# Patient Record
Sex: Female | Born: 1945
Health system: Southern US, Community
[De-identification: ages and names within clinical notes are randomized; demographics above are authoritative.]

## PROBLEM LIST (undated history)

## (undated) DIAGNOSIS — H269 Unspecified cataract: Secondary | ICD-10-CM

## (undated) DIAGNOSIS — E785 Hyperlipidemia, unspecified: Secondary | ICD-10-CM

## (undated) DIAGNOSIS — N952 Postmenopausal atrophic vaginitis: Secondary | ICD-10-CM

## (undated) DIAGNOSIS — D649 Anemia, unspecified: Secondary | ICD-10-CM

## (undated) DIAGNOSIS — N390 Urinary tract infection, site not specified: Secondary | ICD-10-CM

## (undated) DIAGNOSIS — R600 Localized edema: Secondary | ICD-10-CM

## (undated) DIAGNOSIS — R1310 Dysphagia, unspecified: Secondary | ICD-10-CM

## (undated) DIAGNOSIS — M419 Scoliosis, unspecified: Secondary | ICD-10-CM

## (undated) DIAGNOSIS — L409 Psoriasis, unspecified: Secondary | ICD-10-CM

## (undated) DIAGNOSIS — R109 Unspecified abdominal pain: Secondary | ICD-10-CM

## (undated) DIAGNOSIS — R300 Dysuria: Secondary | ICD-10-CM

## (undated) DIAGNOSIS — K529 Noninfective gastroenteritis and colitis, unspecified: Secondary | ICD-10-CM

## (undated) DIAGNOSIS — K219 Gastro-esophageal reflux disease without esophagitis: Secondary | ICD-10-CM

## (undated) DIAGNOSIS — M810 Age-related osteoporosis without current pathological fracture: Secondary | ICD-10-CM

## (undated) DIAGNOSIS — T4145XA Adverse effect of unspecified anesthetic, initial encounter: Secondary | ICD-10-CM

## (undated) DIAGNOSIS — I2510 Atherosclerotic heart disease of native coronary artery without angina pectoris: Secondary | ICD-10-CM

## (undated) DIAGNOSIS — F419 Anxiety disorder, unspecified: Secondary | ICD-10-CM

## (undated) DIAGNOSIS — H8110 Benign paroxysmal vertigo, unspecified ear: Secondary | ICD-10-CM

## (undated) DIAGNOSIS — I10 Essential (primary) hypertension: Secondary | ICD-10-CM

## (undated) DIAGNOSIS — K259 Gastric ulcer, unspecified as acute or chronic, without hemorrhage or perforation: Secondary | ICD-10-CM

## (undated) DIAGNOSIS — T8859XA Other complications of anesthesia, initial encounter: Secondary | ICD-10-CM

## (undated) DIAGNOSIS — K449 Diaphragmatic hernia without obstruction or gangrene: Secondary | ICD-10-CM

## (undated) DIAGNOSIS — Z800 Family history of malignant neoplasm of digestive organs: Secondary | ICD-10-CM

## (undated) DIAGNOSIS — H3530 Unspecified macular degeneration: Secondary | ICD-10-CM

## (undated) DIAGNOSIS — M1990 Unspecified osteoarthritis, unspecified site: Secondary | ICD-10-CM

## (undated) DIAGNOSIS — K222 Esophageal obstruction: Secondary | ICD-10-CM

## (undated) DIAGNOSIS — F4322 Adjustment disorder with anxiety: Secondary | ICD-10-CM

## (undated) DIAGNOSIS — E7800 Pure hypercholesterolemia, unspecified: Secondary | ICD-10-CM

## (undated) DIAGNOSIS — G8929 Other chronic pain: Secondary | ICD-10-CM

## (undated) DIAGNOSIS — R4702 Dysphasia: Secondary | ICD-10-CM

## (undated) HISTORY — DX: Anemia, unspecified: D64.9

## (undated) HISTORY — PX: CARDIAC CATHETERIZATION: SHX172

## (undated) HISTORY — PX: COLONOSCOPY: SHX174

## (undated) HISTORY — PX: ABDOMINAL HYSTERECTOMY: SHX81

## (undated) HISTORY — DX: Unspecified cataract: H26.9

## (undated) HISTORY — PX: ESOPHAGOGASTRODUODENOSCOPY: SHX1529

## (undated) HISTORY — PX: CHOLECYSTECTOMY: SHX55

## (undated) HISTORY — DX: Age-related osteoporosis without current pathological fracture: M81.0

## (undated) HISTORY — PX: HERNIA REPAIR: SHX51

## (undated) HISTORY — DX: Unspecified osteoarthritis, unspecified site: M19.90

## (undated) HISTORY — PX: DIAGNOSTIC LAPAROSCOPY: SUR761

---

## 1979-03-11 DIAGNOSIS — K259 Gastric ulcer, unspecified as acute or chronic, without hemorrhage or perforation: Secondary | ICD-10-CM

## 1979-03-11 HISTORY — DX: Gastric ulcer, unspecified as acute or chronic, without hemorrhage or perforation: K25.9

## 2005-01-12 ENCOUNTER — Ambulatory Visit: Payer: Self-pay | Admitting: Surgery

## 2006-05-26 ENCOUNTER — Ambulatory Visit: Payer: Self-pay | Admitting: Internal Medicine

## 2006-06-20 ENCOUNTER — Ambulatory Visit: Payer: Self-pay | Admitting: Internal Medicine

## 2007-07-18 ENCOUNTER — Ambulatory Visit: Payer: Self-pay | Admitting: Internal Medicine

## 2008-04-10 ENCOUNTER — Ambulatory Visit: Payer: Self-pay | Admitting: Urology

## 2008-08-25 ENCOUNTER — Ambulatory Visit: Payer: Self-pay | Admitting: Internal Medicine

## 2009-09-08 ENCOUNTER — Ambulatory Visit: Payer: Self-pay | Admitting: Unknown Physician Specialty

## 2009-10-07 ENCOUNTER — Ambulatory Visit: Payer: Self-pay | Admitting: Internal Medicine

## 2009-10-20 ENCOUNTER — Ambulatory Visit: Payer: Self-pay | Admitting: Internal Medicine

## 2010-10-26 ENCOUNTER — Ambulatory Visit: Payer: Self-pay | Admitting: Internal Medicine

## 2011-11-15 ENCOUNTER — Ambulatory Visit: Payer: Self-pay | Admitting: Internal Medicine

## 2012-01-08 ENCOUNTER — Ambulatory Visit: Payer: Self-pay | Admitting: Internal Medicine

## 2012-09-02 ENCOUNTER — Ambulatory Visit: Payer: Self-pay | Admitting: Cardiovascular Disease

## 2012-09-06 DIAGNOSIS — K219 Gastro-esophageal reflux disease without esophagitis: Secondary | ICD-10-CM | POA: Insufficient documentation

## 2012-09-06 DIAGNOSIS — I2510 Atherosclerotic heart disease of native coronary artery without angina pectoris: Secondary | ICD-10-CM | POA: Insufficient documentation

## 2013-02-07 ENCOUNTER — Ambulatory Visit: Payer: Self-pay | Admitting: Internal Medicine

## 2013-04-14 ENCOUNTER — Ambulatory Visit: Payer: Self-pay | Admitting: Unknown Physician Specialty

## 2013-04-18 ENCOUNTER — Other Ambulatory Visit: Payer: Self-pay | Admitting: Unknown Physician Specialty

## 2013-04-18 DIAGNOSIS — R1310 Dysphagia, unspecified: Secondary | ICD-10-CM

## 2013-04-18 DIAGNOSIS — K469 Unspecified abdominal hernia without obstruction or gangrene: Secondary | ICD-10-CM

## 2013-04-28 ENCOUNTER — Ambulatory Visit
Admission: RE | Admit: 2013-04-28 | Discharge: 2013-04-28 | Disposition: A | Discharge status: Home / Self Care | Payer: Medicare Other | Source: Ambulatory Visit | Attending: Unknown Physician Specialty | Admitting: Unknown Physician Specialty

## 2013-04-28 DIAGNOSIS — R1310 Dysphagia, unspecified: Secondary | ICD-10-CM

## 2013-04-28 DIAGNOSIS — K469 Unspecified abdominal hernia without obstruction or gangrene: Secondary | ICD-10-CM

## 2013-05-02 DIAGNOSIS — K222 Esophageal obstruction: Secondary | ICD-10-CM

## 2013-05-02 HISTORY — DX: Esophageal obstruction: K22.2

## 2013-05-21 DIAGNOSIS — I10 Essential (primary) hypertension: Secondary | ICD-10-CM | POA: Insufficient documentation

## 2013-05-22 DIAGNOSIS — K449 Diaphragmatic hernia without obstruction or gangrene: Secondary | ICD-10-CM | POA: Insufficient documentation

## 2013-09-12 ENCOUNTER — Ambulatory Visit: Payer: Self-pay | Admitting: Unknown Physician Specialty

## 2014-01-23 ENCOUNTER — Encounter: Payer: Self-pay | Admitting: Surgery

## 2014-02-07 ENCOUNTER — Encounter: Payer: Self-pay | Admitting: Surgery

## 2014-03-20 ENCOUNTER — Ambulatory Visit: Payer: Self-pay | Admitting: Internal Medicine

## 2014-11-12 ENCOUNTER — Encounter
Admission: RE | Disposition: A | Discharge status: Home / Self Care | Payer: Self-pay | Source: Ambulatory Visit | Attending: Unknown Physician Specialty

## 2014-11-12 ENCOUNTER — Ambulatory Visit
Admission: RE | Admit: 2014-11-12 | Discharge: 2014-11-12 | Disposition: A | Discharge status: Home / Self Care | Payer: Medicare HMO | Source: Ambulatory Visit | Attending: Unknown Physician Specialty | Admitting: Unknown Physician Specialty

## 2014-11-12 ENCOUNTER — Ambulatory Visit: Payer: Medicare HMO | Admitting: Anesthesiology

## 2014-11-12 ENCOUNTER — Encounter: Payer: Self-pay | Admitting: *Deleted

## 2014-11-12 DIAGNOSIS — M419 Scoliosis, unspecified: Secondary | ICD-10-CM | POA: Insufficient documentation

## 2014-11-12 DIAGNOSIS — Z7982 Long term (current) use of aspirin: Secondary | ICD-10-CM | POA: Insufficient documentation

## 2014-11-12 DIAGNOSIS — Z9889 Other specified postprocedural states: Secondary | ICD-10-CM | POA: Diagnosis not present

## 2014-11-12 DIAGNOSIS — K640 First degree hemorrhoids: Secondary | ICD-10-CM | POA: Insufficient documentation

## 2014-11-12 DIAGNOSIS — Z9049 Acquired absence of other specified parts of digestive tract: Secondary | ICD-10-CM | POA: Diagnosis not present

## 2014-11-12 DIAGNOSIS — I10 Essential (primary) hypertension: Secondary | ICD-10-CM | POA: Insufficient documentation

## 2014-11-12 DIAGNOSIS — Z79899 Other long term (current) drug therapy: Secondary | ICD-10-CM | POA: Insufficient documentation

## 2014-11-12 DIAGNOSIS — K219 Gastro-esophageal reflux disease without esophagitis: Secondary | ICD-10-CM | POA: Insufficient documentation

## 2014-11-12 DIAGNOSIS — Z90710 Acquired absence of both cervix and uterus: Secondary | ICD-10-CM | POA: Diagnosis not present

## 2014-11-12 DIAGNOSIS — E785 Hyperlipidemia, unspecified: Secondary | ICD-10-CM | POA: Insufficient documentation

## 2014-11-12 DIAGNOSIS — R197 Diarrhea, unspecified: Secondary | ICD-10-CM | POA: Diagnosis not present

## 2014-11-12 DIAGNOSIS — Z800 Family history of malignant neoplasm of digestive organs: Secondary | ICD-10-CM | POA: Insufficient documentation

## 2014-11-12 HISTORY — DX: Anxiety disorder, unspecified: F41.9

## 2014-11-12 HISTORY — DX: Adverse effect of unspecified anesthetic, initial encounter: T41.45XA

## 2014-11-12 HISTORY — DX: Scoliosis, unspecified: M41.9

## 2014-11-12 HISTORY — DX: Gastro-esophageal reflux disease without esophagitis: K21.9

## 2014-11-12 HISTORY — DX: Essential (primary) hypertension: I10

## 2014-11-12 HISTORY — DX: Esophageal obstruction: K22.2

## 2014-11-12 HISTORY — DX: Other complications of anesthesia, initial encounter: T88.59XA

## 2014-11-12 HISTORY — DX: Gastric ulcer, unspecified as acute or chronic, without hemorrhage or perforation: K25.9

## 2014-11-12 HISTORY — DX: Pure hypercholesterolemia, unspecified: E78.00

## 2014-11-12 HISTORY — PX: COLONOSCOPY: SHX5424

## 2014-11-12 SURGERY — COLONOSCOPY
Anesthesia: Monitor Anesthesia Care

## 2014-11-12 MED ORDER — SODIUM CHLORIDE 0.9 % IV SOLN: INTRAVENOUS | Status: DC

## 2014-11-12 MED ORDER — PROPOFOL INFUSION 10 MG/ML OPTIME
INTRAVENOUS | Status: DC | PRN
  Administered 2014-11-12: 120 ug/kg/min via INTRAVENOUS

## 2014-11-12 MED ORDER — LACTATED RINGERS IV SOLN
INTRAVENOUS | Status: DC
  Administered 2014-11-12: 10:00:00 via INTRAVENOUS
  Administered 2014-11-12: 1000 mL via INTRAVENOUS

## 2014-11-12 MED ORDER — FENTANYL CITRATE (PF) 100 MCG/2ML IJ SOLN
INTRAMUSCULAR | Status: DC | PRN
  Administered 2014-11-12: 50 ug via INTRAVENOUS

## 2014-11-12 MED ORDER — MIDAZOLAM HCL 2 MG/2ML IJ SOLN
INTRAMUSCULAR | Status: DC | PRN
  Administered 2014-11-12: 2 mg via INTRAVENOUS

## 2014-11-12 NOTE — Op Note (Signed)
[facility] Patient Name: Lindsay Tran Procedure Date: 11/12/2014 10:29 AM MRN: [id number] Account #: [id number] Date of Birth: [birth year] Admit Type: Outpatient Age: 69 Room: [facility] ENDO ROOM 1 Gender: Female Note Status: Finalized Procedure:         Colonoscopy Indications:       Chronic diarrhea, Clinically significant diarrhea of                     unexplained origin Providers:         Manya Silvas, MD Referring MD:      Perrin Maltese, MD (Referring MD) Medicines:         Propofol per Anesthesia Complications:     No immediate complications. Procedure:         Pre-Anesthesia Assessment:                    - After reviewing the risks and benefits, the patient was                     deemed in satisfactory condition to undergo the procedure.                    After obtaining informed consent, the colonoscope was                     passed under direct vision. Throughout the procedure, the                     patient's blood pressure, pulse, and oxygen saturations                     were monitored continuously. The Olympus PCF-H180AL                     colonoscope ( S#: [id number] ) was introduced through the                     anus and advanced to the the cecum, identified by                     appendiceal orifice and ileocecal valve. The colonoscopy                     was performed without difficulty. The patient tolerated                     the procedure well. The quality of the bowel preparation                     was excellent. Findings:      Internal hemorrhoids were found during endoscopy. The hemorrhoids were       small and Grade I (internal hemorrhoids that do not prolapse).      No other significant abnormalities were identified in a careful       examination of the remainder of the colon. Due to diarrhea biopsies done       of right and left colon, ascending and sigmoid colon. Impression:        - Internal hemorrhoids.                 - No specimens collected. Recommendation:    - Await pathology results. Manya Silvas, MD 11/12/2014 10:49:00 AM This report has been signed electronically. Number of Addenda:  0 Note Initiated On: 11/12/2014 10:29 AM Scope Withdrawal Time: 0 hours 6 minutes 32 seconds  Total Procedure Duration: 0 hours 13 minutes 4 seconds       [facility]

## 2014-11-12 NOTE — Anesthesia Preprocedure Evaluation (Signed)
Anesthesia Evaluation  Patient identified by MRN, date of birth, ID band  Reviewed: Allergy & Precautions, H&P , NPO status , Patient's Chart, lab work & pertinent test results, reviewed documented beta blocker date and time   History of Anesthesia Complications (+) history of anesthetic complications  Airway Mallampati: II  TM Distance: >3 FB     Dental   Pulmonary          Cardiovascular hypertension, On Medications and On Home Beta Blockers     Neuro/Psych    GI/Hepatic PUD, GERD-  ,  Endo/Other    Renal/GU      Musculoskeletal   Abdominal   Peds  Hematology   Anesthesia Other Findings   Reproductive/Obstetrics                             Anesthesia Physical Anesthesia Plan  ASA: III  Anesthesia Plan: MAC and General   Post-op Pain Management:    Induction: Intravenous  Airway Management Planned: Nasal Cannula  Additional Equipment:   Intra-op Plan:   Post-operative Plan:   Informed Consent: I have reviewed the patients History and Physical, chart, labs and discussed the procedure including the risks, benefits and alternatives for the proposed anesthesia with the patient or authorized representative who has indicated his/her understanding and acceptance.     Plan Discussed with:   Anesthesia Plan Comments:         Anesthesia Quick Evaluation

## 2014-11-12 NOTE — Transfer of Care (Signed)
Immediate Anesthesia Transfer of Care Note  Patient: Lindsay Tran  Procedure(s) Performed: Procedure(s): COLONOSCOPY (N/A)  Patient Location: PACU  Anesthesia Type:MAC  Level of Consciousness: awake, alert  and oriented  Airway & Oxygen Therapy: Patient Spontanous Breathing  Post-op Assessment: Report given to RN  Post vital signs: Reviewed and stable  Last Vitals:  Filed Vitals:   11/12/14 0943  BP: 121/75  Pulse: 70  Temp: 36.4 C  Resp: 22    Complications: No apparent anesthesia complications

## 2014-11-12 NOTE — H&P (Signed)
The recent H&P (dated 10/21/2014) was reviewed, the patient was examined and there is no change in the patients condition since that H&P was completed.   Lindsay Tran, Lindsay Tran  11/12/2014, 10:01 AM

## 2014-11-12 NOTE — Anesthesia Postprocedure Evaluation (Signed)
  Anesthesia Post-op Note  Patient: Lindsay Tran  Procedure(s) Performed: Procedure(s): COLONOSCOPY (N/A)  Anesthesia type:MAC, General  Patient location: PACU  Post pain: Pain level controlled  Post assessment: Post-op Vital signs reviewed, Patient's Cardiovascular Status Stable, Respiratory Function Stable, Patent Airway and No signs of Nausea or vomiting  Post vital signs: Reviewed and stable  Last Vitals:  Filed Vitals:   11/12/14 1130  BP: 131/66  Pulse: 65  Temp:   Resp: 17    Level of consciousness: awake, alert  and patient cooperative  Complications: No apparent anesthesia complications

## 2014-11-13 ENCOUNTER — Encounter: Payer: Self-pay | Admitting: Unknown Physician Specialty

## 2014-11-13 LAB — SURGICAL PATHOLOGY

## 2014-12-17 DIAGNOSIS — K529 Noninfective gastroenteritis and colitis, unspecified: Secondary | ICD-10-CM | POA: Insufficient documentation

## 2014-12-18 DIAGNOSIS — Z800 Family history of malignant neoplasm of digestive organs: Secondary | ICD-10-CM | POA: Insufficient documentation

## 2015-03-16 ENCOUNTER — Other Ambulatory Visit: Payer: Self-pay | Admitting: Internal Medicine

## 2015-03-16 DIAGNOSIS — Z1231 Encounter for screening mammogram for malignant neoplasm of breast: Secondary | ICD-10-CM

## 2015-03-25 ENCOUNTER — Other Ambulatory Visit: Payer: Self-pay | Admitting: Internal Medicine

## 2015-03-25 ENCOUNTER — Ambulatory Visit
Admission: RE | Admit: 2015-03-25 | Discharge: 2015-03-25 | Disposition: A | Discharge status: Home / Self Care | Payer: Medicare HMO | Source: Ambulatory Visit | Attending: Internal Medicine | Admitting: Internal Medicine

## 2015-03-25 DIAGNOSIS — Z1231 Encounter for screening mammogram for malignant neoplasm of breast: Secondary | ICD-10-CM | POA: Diagnosis not present

## 2015-07-21 ENCOUNTER — Ambulatory Visit: Payer: Medicare HMO | Admitting: Family Medicine

## 2015-07-21 ENCOUNTER — Ambulatory Visit (INDEPENDENT_AMBULATORY_CARE_PROVIDER_SITE_OTHER): Payer: PPO | Admitting: Nurse Practitioner

## 2015-07-21 ENCOUNTER — Encounter: Payer: Self-pay | Admitting: Nurse Practitioner

## 2015-07-21 VITALS — BP 124/76 | HR 59 | Temp 97.6°F | Resp 16 | Ht 60.0 in | Wt 159.2 lb

## 2015-07-21 DIAGNOSIS — R21 Rash and other nonspecific skin eruption: Secondary | ICD-10-CM

## 2015-07-21 DIAGNOSIS — Z7689 Persons encountering health services in other specified circumstances: Secondary | ICD-10-CM

## 2015-07-21 DIAGNOSIS — I10 Essential (primary) hypertension: Secondary | ICD-10-CM | POA: Diagnosis not present

## 2015-07-21 DIAGNOSIS — Z7189 Other specified counseling: Secondary | ICD-10-CM | POA: Diagnosis not present

## 2015-07-21 DIAGNOSIS — I2510 Atherosclerotic heart disease of native coronary artery without angina pectoris: Secondary | ICD-10-CM

## 2015-07-21 DIAGNOSIS — Z23 Encounter for immunization: Secondary | ICD-10-CM

## 2015-07-21 DIAGNOSIS — F4322 Adjustment disorder with anxiety: Secondary | ICD-10-CM

## 2015-07-21 DIAGNOSIS — K449 Diaphragmatic hernia without obstruction or gangrene: Secondary | ICD-10-CM

## 2015-07-21 DIAGNOSIS — E785 Hyperlipidemia, unspecified: Secondary | ICD-10-CM

## 2015-07-21 DIAGNOSIS — Z800 Family history of malignant neoplasm of digestive organs: Secondary | ICD-10-CM

## 2015-07-21 DIAGNOSIS — K529 Noninfective gastroenteritis and colitis, unspecified: Secondary | ICD-10-CM

## 2015-07-21 DIAGNOSIS — K219 Gastro-esophageal reflux disease without esophagitis: Secondary | ICD-10-CM

## 2015-07-21 LAB — COMPREHENSIVE METABOLIC PANEL
ALT: 11 U/L (ref 0–35)
AST: 22 U/L (ref 0–37)
Albumin: 4.2 g/dL (ref 3.5–5.2)
Alkaline Phosphatase: 40 U/L (ref 39–117)
BUN: 21 mg/dL (ref 6–23)
CHLORIDE: 102 meq/L (ref 96–112)
CO2: 29 meq/L (ref 19–32)
Calcium: 9.6 mg/dL (ref 8.4–10.5)
Creatinine, Ser: 0.92 mg/dL (ref 0.40–1.20)
GFR: 63.79 mL/min (ref >60.00)
Glucose, Bld: 94 mg/dL (ref 70–99)
POTASSIUM: 4.4 meq/L (ref 3.5–5.1)
SODIUM: 137 meq/L (ref 135–145)
Total Bilirubin: 0.5 mg/dL (ref 0.2–1.2)
Total Protein: 6.8 g/dL (ref 6.0–8.3)

## 2015-07-21 LAB — CBC WITH DIFFERENTIAL/PLATELET
BASOS PCT: 1.2 % (ref 0.0–3.0)
Basophils Absolute: 0.1 10*3/uL (ref 0.0–0.1)
EOS ABS: 0.2 10*3/uL (ref 0.0–0.7)
EOS PCT: 2.7 % (ref 0.0–5.0)
HCT: 38.5 % (ref 36.0–46.0)
HEMOGLOBIN: 12.5 g/dL (ref 12.0–15.0)
Lymphocytes Relative: 16.1 % (ref 12.0–46.0)
Lymphs Abs: 1.5 10*3/uL (ref 0.7–4.0)
MCHC: 32.5 g/dL (ref 30.0–36.0)
MCV: 86.4 fl (ref 78.0–100.0)
MONO ABS: 0.7 10*3/uL (ref 0.1–1.0)
Monocytes Relative: 7.5 % (ref 3.0–12.0)
NEUTROS ABS: 6.6 10*3/uL (ref 1.4–7.7)
Neutrophils Relative %: 72.5 % (ref 43.0–77.0)
PLATELETS: 472 10*3/uL — AB (ref 150.0–400.0)
RBC: 4.45 Mil/uL (ref 3.87–5.11)
RDW: 13.8 % (ref 11.5–15.5)
WBC: 9.1 10*3/uL (ref 4.0–10.5)

## 2015-07-21 LAB — LIPID PANEL
CHOL/HDL RATIO: 3
Cholesterol: 122 mg/dL (ref 0–200)
HDL: 40.1 mg/dL (ref >39.00)
LDL CALC: 62 mg/dL (ref 0–99)
NonHDL: 81.65
TRIGLYCERIDES: 98 mg/dL (ref 0.0–149.0)
VLDL: 19.6 mg/dL (ref 0.0–40.0)

## 2015-07-21 MED ORDER — CLOBETASOL PROPIONATE 0.05 % EX OINT: 1.0000 "application " | TOPICAL_OINTMENT | Freq: Two times a day (BID) | CUTANEOUS | Status: DC

## 2015-07-21 MED ORDER — PREDNISONE 10 MG PO TABS: ORAL_TABLET | ORAL | Status: DC

## 2015-07-21 MED ORDER — SIMVASTATIN 40 MG PO TABS: 40.0000 mg | ORAL_TABLET | Freq: Every day | ORAL | Status: DC

## 2015-07-21 MED ORDER — FENOFIBRATE 160 MG PO TABS: 160.0000 mg | ORAL_TABLET | Freq: Every day | ORAL | Status: DC

## 2015-07-21 MED ORDER — METOPROLOL TARTRATE 100 MG PO TABS: 100.0000 mg | ORAL_TABLET | Freq: Two times a day (BID) | ORAL | Status: DC

## 2015-07-21 NOTE — Progress Notes (Addendum)
Patient ID: Lindsay Tran, female    DOB: 1946/05/28  Age: 70 y.o. MRN: [id number]  CC: Establish Care and Rash   HPI Lindsay Tran presents for establishing care and CC of rash x 2 weeks.   1) New Pt Info:   Immunizations- Needs Tdap, pna 2011, flu declines   Mammogram- 2016   Pap- Hysterectomy  Bone Density- Unknown   Colonoscopy- 2016   2) Chronic Problems-  Arthritis- hands, chicken pox, GERD- stable, Heart disease she wrote calcification LVEF 65% (?) HTN, HLD, UTI's 4-5 yrs ago last one   3) Acute Problems-  Psoriasis- Top of legs and back   Rash- 2 weeks, pruritic, erythematous, not spreading  Tried her clobetasol without relief  Benadryl ect... Denies changes to cosmetics, detergent, body products, pets, foods   History Lindsay Tran has a past medical history of GERD (gastroesophageal reflux disease); Hypertension; Gastric ulcer (1980's); Scoliosis; Schatzki's ring (05/02/2013); High cholesterol; Anxiety; and Complication of anesthesia.   She has past surgical history that includes Diagnostic laparoscopy; Esophagogastroduodenoscopy; Cholecystectomy; Colonoscopy; Cardiac catheterization; Hernia repair; Abdominal hysterectomy; and Colonoscopy (N/A, 11/12/2014).   Her family history is not on file.She reports that she has never smoked. She does not have any smokeless tobacco history on file. She reports that she does not drink alcohol or use illicit drugs.  Outpatient Prescriptions Prior to Visit  Medication Sig Dispense Refill  . Ascorbic Acid (VITAMIN C) 100 MG tablet Take 100 mg by mouth daily.    Marland Kitchen aspirin 81 MG tablet Take 81 mg by mouth daily.    . cholecalciferol (VITAMIN D) 400 UNITS TABS tablet Take 400 Units by mouth.    . cholestyramine light 4 G POWD Take by mouth 1 day or 1 dose.    Marland Kitchen FIBER, CORN DEXTRIN, PO Take by mouth 2 (two) times daily.    . Misc Natural Products (OSTEO BI-FLEX/5-LOXIN ADVANCED) TABS Take 1 tablet by mouth 2 (two) times daily.    . Multiple  Vitamin (MULTIVITAMIN) capsule Take 1 capsule by mouth daily.    Marland Kitchen NAPROXEN PO Take by mouth 2 (two) times daily.    Marland Kitchen omega-3 acid ethyl esters (LOVAZA) 1 G capsule Take by mouth daily.     . potassium chloride (K-DUR) 10 MEQ tablet Take 10 mEq by mouth daily.    Marland Kitchen saccharomyces boulardii (FLORASTOR) 250 MG capsule Take 250 mg by mouth 2 (two) times daily.    . vitamin B-12 (CYANOCOBALAMIN) 100 MCG tablet Take 100 mcg by mouth daily.    . clobetasol ointment (TEMOVATE) [id number] % Apply 1 application topically 2 (two) times daily.    . fenofibrate 160 MG tablet Take 160 mg by mouth daily.    . metoprolol (LOPRESSOR) 100 MG tablet Take 100 mg by mouth 2 (two) times daily.    . simvastatin (ZOCOR) 40 MG tablet Take 40 mg by mouth daily at 6 PM.     No facility-administered medications prior to visit.   Essential tremor  Tdap tday ROS Review of Systems  Constitutional: Negative for fever, chills, diaphoresis and fatigue.  Respiratory: Negative for chest tightness, shortness of breath and wheezing.   Cardiovascular: Negative for chest pain, palpitations and leg swelling.  Gastrointestinal: Positive for diarrhea. Negative for nausea and vomiting.       Chronic  Skin: Positive for rash.  Neurological: Negative for dizziness, weakness, numbness and headaches.  Psychiatric/Behavioral: The patient is not nervous/anxious.     Objective:  BP 124/76 mmHg  Pulse  59  Temp(Src) 97.6 F (36.4 C) (Oral)  Resp 16  Ht 5' (1.524 m)  Wt 159 lb 3.2 oz (72.213 kg)  BMI 31.09 kg/m2  SpO2 97%  Physical Exam  Constitutional: She is oriented to person, place, and time. She appears well-developed and well-nourished. No distress.  HENT:  Head: Normocephalic and atraumatic.  Right Ear: External ear normal.  Left Ear: External ear normal.  Mouth/Throat: No oropharyngeal exudate.  Oropharynx not edematous  Eyes:  Glasses  Neck: Normal range of motion. Neck supple.  Cardiovascular: Regular rhythm and  normal heart sounds.  Exam reveals no gallop and no friction rub.   No murmur heard. Pulmonary/Chest: Effort normal and breath sounds normal. No stridor. No respiratory distress. She has no wheezes. She has no rales. She exhibits no tenderness.  Musculoskeletal:  Scoliosis and kyphosis  Neurological: She is alert and oriented to person, place, and time. No cranial nerve deficit. She exhibits normal muscle tone. Coordination normal.  Skin: Skin is warm and dry. Rash noted. She is not diaphoretic.  Arms and back, lightly erythematous from scratching, tiny papules  Psychiatric: She has a normal mood and affect. Her behavior is normal. Judgment and thought content normal.   Assessment & Plan:   [name] was seen today for establish care and rash.  Diagnoses and all orders for this visit:  Hyperlipidemia -     Lipid panel  Benign essential HTN -     CBC with Differential/Platelet -     Comprehensive metabolic panel  Need for Tdap vaccination -     Tdap vaccine greater than or equal to 7yo IM  Encounter to establish care  CAD in native artery  Gastroesophageal hernia  Chronic diarrhea  Gastroesophageal reflux disease without esophagitis  Adjustment disorder with anxiety  Family history of colon cancer  Rash and nonspecific skin eruption  Other orders -     predniSONE (DELTASONE) 10 MG tablet; Take 6 tablets by mouth on day 1 with breakfast then decrease by 1 tablet each day until gone. -     fenofibrate 160 MG tablet; Take 1 tablet (160 mg total) by mouth daily. -     simvastatin (ZOCOR) 40 MG tablet; Take 1 tablet (40 mg total) by mouth daily at 6 PM. -     metoprolol (LOPRESSOR) 100 MG tablet; Take 1 tablet (100 mg total) by mouth 2 (two) times daily. -     clobetasol ointment (TEMOVATE) 0.05 %; Apply 1 application topically 2 (two) times daily. -     Cancel: Tdap vaccine greater than or equal to 7yo IM   I have changed Ms. [name]'s fenofibrate, simvastatin, and  metoprolol. I am also having her start on predniSONE. Additionally, I am having her maintain her aspirin, omega-3 acid ethyl esters, saccharomyces boulardii, vitamin C, cholecalciferol, vitamin B-12, potassium chloride, cholestyramine light, OSTEO BI-FLEX/5-LOXIN ADVANCED, NAPROXEN PO, multivitamin, (FIBER, CORN DEXTRIN, PO), Potassium, HM GLUCOSAMINE & VITAMIN D3, and clobetasol ointment.  Meds ordered this encounter  Medications  . Potassium 99 MG TABS    Sig: Take by mouth.  . Boswellia-Glucosamine-Vit D (HM GLUCOSAMINE & VITAMIN D3) TABS    Sig: Take by mouth.  . predniSONE (DELTASONE) 10 MG tablet    Sig: Take 6 tablets by mouth on day 1 with breakfast then decrease by 1 tablet each day until gone.    Dispense:  21 tablet    Refill:  0    Order Specific Question:  Supervising Provider  Answer:  TULLO, TERESA L [[id number]]  . fenofibrate 160 MG tablet    Sig: Take 1 tablet (160 mg total) by mouth daily.    Dispense:  90 tablet    Refill:  3    Order Specific Question:  Supervising Provider    Answer:  Deborra Medina L [[id number]]  . simvastatin (ZOCOR) 40 MG tablet    Sig: Take 1 tablet (40 mg total) by mouth daily at 6 PM.    Dispense:  90 tablet    Refill:  3    Order Specific Question:  Supervising Provider    Answer:  Deborra Medina L [[id number]]  . metoprolol (LOPRESSOR) 100 MG tablet    Sig: Take 1 tablet (100 mg total) by mouth 2 (two) times daily.    Dispense:  180 tablet    Refill:  3    Order Specific Question:  Supervising Provider    Answer:  Deborra Medina L [[id number]]  . clobetasol ointment (TEMOVATE) 0.05 %    Sig: Apply 1 application topically 2 (two) times daily.    Dispense:  30 g    Refill:  1    Order Specific Question:  Supervising Provider    Answer:  Crecencio Mc [[id number]]     Follow-up: Return in about 4 weeks (around 08/18/2015) for Follow up.

## 2015-07-21 NOTE — Patient Instructions (Addendum)
Prednisone with breakfast or lunch at the latest.  6 tablets on day 1, 5 tablets on day 2, 4 tablets on day 3, 3 tablets on day 4, 2 tablets day 5, 1 tablet on day 6...done! Take tablets all together not spaced out Don't take with NSAIDs (Ibuprofen, Aleve, Naproxen, Meloxicam ect...) Start tomorrow Stop the Naproxen while on this  Thank you for getting your tdap today  Nice to meet you!

## 2015-07-27 ENCOUNTER — Encounter: Payer: Self-pay | Admitting: Nurse Practitioner

## 2015-07-27 DIAGNOSIS — I10 Essential (primary) hypertension: Secondary | ICD-10-CM | POA: Insufficient documentation

## 2015-07-27 DIAGNOSIS — Z23 Encounter for immunization: Secondary | ICD-10-CM | POA: Insufficient documentation

## 2015-07-27 DIAGNOSIS — Z7689 Persons encountering health services in other specified circumstances: Secondary | ICD-10-CM | POA: Insufficient documentation

## 2015-07-27 DIAGNOSIS — E785 Hyperlipidemia, unspecified: Secondary | ICD-10-CM | POA: Insufficient documentation

## 2015-07-27 DIAGNOSIS — R21 Rash and other nonspecific skin eruption: Secondary | ICD-10-CM | POA: Insufficient documentation

## 2015-07-27 NOTE — Assessment & Plan Note (Signed)
BP Readings from Last 3 Encounters:  07/21/15 124/76  11/12/14 131/66   Stable. Continue regimen

## 2015-07-27 NOTE — Assessment & Plan Note (Signed)
See Overview Stable

## 2015-07-27 NOTE — Assessment & Plan Note (Signed)
On medication and probiotics Stable

## 2015-07-27 NOTE — Assessment & Plan Note (Signed)
Stable at this time 

## 2015-07-27 NOTE — Assessment & Plan Note (Signed)
New onset Prednisone taper given to pt with instructions verbally and on AVS

## 2015-07-27 NOTE — Assessment & Plan Note (Signed)
Discussed acute and chronic issues. Reviewed health maintenance measures, PFSHx, and immunizations. Obtain records from previous facility.   

## 2015-07-27 NOTE — Assessment & Plan Note (Signed)
Done today.

## 2015-07-27 NOTE — Assessment & Plan Note (Signed)
Stable- hiatal hernia On prbiotics

## 2015-07-27 NOTE — Assessment & Plan Note (Signed)
Stable currently per pt, avoids triggers

## 2015-07-27 NOTE — Assessment & Plan Note (Signed)
Fasting. Getting a lipid panel today On Zocor- refilled Fenofibrate refilled also

## 2015-07-27 NOTE — Assessment & Plan Note (Signed)
Colon screening UTD

## 2015-08-03 ENCOUNTER — Encounter: Payer: Self-pay | Admitting: Family Medicine

## 2015-08-03 ENCOUNTER — Ambulatory Visit (INDEPENDENT_AMBULATORY_CARE_PROVIDER_SITE_OTHER): Payer: PPO | Admitting: Family Medicine

## 2015-08-03 VITALS — BP 130/82 | HR 68 | Temp 97.6°F | Ht 60.0 in | Wt 158.1 lb

## 2015-08-03 DIAGNOSIS — R109 Unspecified abdominal pain: Secondary | ICD-10-CM | POA: Insufficient documentation

## 2015-08-03 DIAGNOSIS — R102 Pelvic and perineal pain: Secondary | ICD-10-CM | POA: Insufficient documentation

## 2015-08-03 DIAGNOSIS — R1031 Right lower quadrant pain: Secondary | ICD-10-CM | POA: Diagnosis not present

## 2015-08-03 LAB — POC URINALSYSI DIPSTICK (AUTOMATED)
BILIRUBIN UA: NEGATIVE
Glucose, UA: NEGATIVE
Ketones, UA: NEGATIVE
LEUKOCYTES UA: NEGATIVE
NITRITE UA: NEGATIVE
PH UA: 5.5
PROTEIN UA: NEGATIVE
Spec Grav, UA: 1.02
UROBILINOGEN UA: 0.2

## 2015-08-03 MED ORDER — CIPROFLOXACIN HCL 500 MG PO TABS: 500.0000 mg | ORAL_TABLET | Freq: Two times a day (BID) | ORAL | Status: DC

## 2015-08-03 NOTE — Progress Notes (Signed)
Pre visit review using our clinic review tool, if applicable. No additional management support is needed unless otherwise documented below in the visit note. 

## 2015-08-03 NOTE — Progress Notes (Signed)
Subjective:  Patient ID: Lindsay Tran, female    DOB: [birth year]  Age: 70 y.o. MRN: [id number]  CC: Abdominal pain, ? UTI  HPI:  70 year old female with a reported history of UTI presents with the above complaints.  Patient states on Saturday she developed burning with urination as well as suprapubic pain and right lower quadrant pain. She states that given her symptoms she thought she had a UTI and took some leftover Bactrim that she had at home. She denies any associated fever. She does report some nausea. No vomiting. She states that she has improved slightly but she continues to have pain. No known exacerbating factors. No other associated symptoms. She states that these the symptoms that she has when she has UTI.  Social Hx   Social History   Social History  . Marital Status: Single    Spouse Name: N/A  . Number of Children: N/A  . Years of Education: N/A   Social History Main Topics  . Smoking status: Never Smoker   . Smokeless tobacco: None  . Alcohol Use: No  . Drug Use: No  . Sexual Activity: No   Other Topics Concern  . None   Social History Narrative   Divorced   Retired as a Scientist, research (medical)   12 + years of school    Review of Systems  Constitutional: Negative for fever.  Gastrointestinal: Positive for nausea and abdominal pain. Negative for vomiting.  Genitourinary: Positive for dysuria.   Objective:  BP 130/82 mmHg  Pulse 68  Temp(Src) 97.6 F (36.4 C) (Oral)  Ht 5' (1.524 m)  Wt 158 lb 2 oz (71.725 kg)  BMI 30.88 kg/m2  SpO2 98%  BP/Weight 08/03/2015 [id number]  Systolic BP [id number]  Diastolic BP 82 76 66  Wt. (Lbs) 158.13 159.2 160  BMI 30.88 31.09 31.25   Physical Exam  Constitutional: She appears well-developed. No distress.  Cardiovascular: Normal rate and regular rhythm.   Pulmonary/Chest: Effort normal and breath sounds normal.  Abdominal: Soft.  Suprapubic tenderness and RLQ pain. No rebound or guarding.   Neurological:  She is alert.  Psychiatric: She has a normal mood and affect.  Vitals reviewed.  Lab Results  Component Value Date   WBC 9.1 07/21/2015   HGB 12.5 07/21/2015   HCT 38.5 07/21/2015   PLT 472.0* 07/21/2015   GLUCOSE 94 07/21/2015   CHOL 122 07/21/2015   TRIG 98.0 07/21/2015   HDL 40.10 07/21/2015   LDLCALC 62 07/21/2015   ALT 11 07/21/2015   AST 22 07/21/2015   NA 137 07/21/2015   K 4.4 07/21/2015   CL 102 07/21/2015   CREATININE 0.92 07/21/2015   BUN 21 07/21/2015   CO2 29 07/21/2015   Results for orders placed or performed in visit on 08/03/15 (from the past 24 hour(s))  POCT Urinalysis Dipstick (Automated)     Status: Normal   Collection Time: 08/03/15  4:35 PM  Result Value Ref Range   Color, UA yellow    Clarity, UA clear    Glucose, UA neg    Bilirubin, UA neg    Ketones, UA neg    Spec Grav, UA 1.020    Blood, UA trace-intact    pH, UA 5.5    Protein, UA neg    Urobilinogen, UA 0.2    Nitrite, UA neg    Leukocytes, UA Negative Negative   Assessment & Plan:   Problem List Items Addressed This Visit  Abdominal pain    New problem. Reports that this occurs with UTI's. I will treat for UTI (as outlined in separate problem). Encouraged to go to ED if she worsens or fails to improve.       Suprapubic pain - Primary    New problem. With recent dysuria. ? UTI. Difficult to discern given UA findings and recent antibiotic use. Given reported history will treat empirically with Cipro while awaiting culture.      Relevant Orders   POCT Urinalysis Dipstick (Automated) (Completed)   Urine Culture      Meds ordered this encounter  Medications  . ciprofloxacin (CIPRO) 500 MG tablet    Sig: Take 1 tablet (500 mg total) by mouth 2 (two) times daily.    Dispense:  10 tablet    Refill:  0    Follow-up: PRN  [place]

## 2015-08-03 NOTE — Patient Instructions (Signed)
Take the antibiotic while we await the culture.    If you worsen please let me know as soon as possible.  Take care  Dr. Lacinda Axon

## 2015-08-03 NOTE — Assessment & Plan Note (Signed)
New problem. Reports that this occurs with UTI's. I will treat for UTI (as outlined in separate problem). Encouraged to go to ED if she worsens or fails to improve.

## 2015-08-03 NOTE — Assessment & Plan Note (Signed)
New problem. With recent dysuria. ? UTI. Difficult to discern given UA findings and recent antibiotic use. Given reported history will treat empirically with Cipro while awaiting culture.

## 2015-08-06 LAB — URINE CULTURE
COLONY COUNT: NO GROWTH
ORGANISM ID, BACTERIA: NO GROWTH

## 2015-08-26 ENCOUNTER — Ambulatory Visit (INDEPENDENT_AMBULATORY_CARE_PROVIDER_SITE_OTHER): Payer: PPO | Admitting: Nurse Practitioner

## 2015-08-26 ENCOUNTER — Other Ambulatory Visit: Payer: Self-pay | Admitting: Nurse Practitioner

## 2015-08-26 ENCOUNTER — Encounter: Payer: Self-pay | Admitting: Nurse Practitioner

## 2015-08-26 ENCOUNTER — Telehealth: Payer: Self-pay | Admitting: *Deleted

## 2015-08-26 VITALS — BP 110/60 | HR 62 | Temp 97.5°F | Ht 60.0 in | Wt 158.5 lb

## 2015-08-26 DIAGNOSIS — R21 Rash and other nonspecific skin eruption: Secondary | ICD-10-CM | POA: Diagnosis not present

## 2015-08-26 DIAGNOSIS — E669 Obesity, unspecified: Secondary | ICD-10-CM

## 2015-08-26 MED ORDER — CLOBETASOL PROPIONATE 0.05 % EX FOAM: Freq: Two times a day (BID) | CUTANEOUS | Status: DC

## 2015-08-26 MED ORDER — CLOBETASOL PROPIONATE 0.05 % EX OINT: 1.0000 "application " | TOPICAL_OINTMENT | Freq: Two times a day (BID) | CUTANEOUS | Status: DC

## 2015-08-26 NOTE — Progress Notes (Signed)
Patient ID: Lindsay Tran, female    DOB: 04-Jun-1946  Age: 70 y.o. MRN: [id number]  CC: Follow-up   HPI Lindsay Tran presents for follow-up of rash on right arm and scalp.  1) Refills were requested of the clobetasol ointment and something for her scalp as well She is requesting a three-month supply  History Lindsay Tran has a past medical history of GERD (gastroesophageal reflux disease); Hypertension; Gastric ulcer (1980's); Scoliosis; Schatzki's ring (05/02/2013); High cholesterol; Anxiety; and Complication of anesthesia.   She has past surgical history that includes Diagnostic laparoscopy; Esophagogastroduodenoscopy; Cholecystectomy; Colonoscopy; Cardiac catheterization; Hernia repair; Abdominal hysterectomy; and Colonoscopy (N/A, 11/12/2014).   Her family history includes Alzheimer's disease in her maternal aunt, paternal aunt, and sister; Cancer in her father and sister; Diabetes in her mother, sister, and sister; Parkinson's disease in her mother.She reports that she has never smoked. She does not have any smokeless tobacco history on file. She reports that she does not drink alcohol or use illicit drugs.  Outpatient Prescriptions Prior to Visit  Medication Sig Dispense Refill  . Ascorbic Acid (VITAMIN C) 100 MG tablet Take 100 mg by mouth daily.    Marland Kitchen aspirin 81 MG tablet Take 81 mg by mouth daily.    . cholestyramine light 4 G POWD Take by mouth 1 day or 1 dose.    . fenofibrate 160 MG tablet Take 1 tablet (160 mg total) by mouth daily. 90 tablet 3  . metoprolol (LOPRESSOR) 100 MG tablet Take 1 tablet (100 mg total) by mouth 2 (two) times daily. 180 tablet 3  . Misc Natural Products (OSTEO BI-FLEX/5-LOXIN ADVANCED) TABS Take 1 tablet by mouth 2 (two) times daily.    . Multiple Vitamin (MULTIVITAMIN) capsule Take 1 capsule by mouth daily.    Marland Kitchen NAPROXEN PO Take by mouth 2 (two) times daily.    . potassium chloride (K-DUR) 10 MEQ tablet Take 10 mEq by mouth daily.    . simvastatin (ZOCOR)  40 MG tablet Take 1 tablet (40 mg total) by mouth daily at 6 PM. 90 tablet 3  . clobetasol ointment (TEMOVATE) AB-123456789 % Apply 1 application topically 2 (two) times daily. 30 g 1  . Boswellia-Glucosamine-Vit D (HM GLUCOSAMINE & VITAMIN D3) TABS Take by mouth. Reported on 08/26/2015    . cholecalciferol (VITAMIN D) 400 UNITS TABS tablet Take 400 Units by mouth. Reported on 08/26/2015    . FIBER, CORN DEXTRIN, PO Take by mouth 2 (two) times daily. Reported on 08/26/2015    . omega-3 acid ethyl esters (LOVAZA) 1 G capsule Take by mouth daily. Reported on 08/26/2015    . saccharomyces boulardii (FLORASTOR) 250 MG capsule Take 250 mg by mouth 2 (two) times daily. Reported on 08/26/2015    . vitamin B-12 (CYANOCOBALAMIN) 100 MCG tablet Take 100 mcg by mouth daily. Reported on 08/26/2015    . ciprofloxacin (CIPRO) 500 MG tablet Take 1 tablet (500 mg total) by mouth 2 (two) times daily. (Patient not taking: Reported on 08/26/2015) 10 tablet 0   No facility-administered medications prior to visit.    ROS Review of Systems  Constitutional: Negative for fever, chills, diaphoresis and fatigue.  Respiratory: Negative for chest tightness, shortness of breath and wheezing.   Cardiovascular: Negative for chest pain, palpitations and leg swelling.  Gastrointestinal: Negative for nausea, vomiting and diarrhea.  Skin: Positive for rash.       Improved with Medication use  Neurological: Negative for dizziness, weakness, numbness and headaches.  Psychiatric/Behavioral: The patient  is not nervous/anxious.     Objective:  BP 110/60 mmHg  Pulse 62  Temp(Src) 97.5 F (36.4 C) (Oral)  Ht 5' (1.524 m)  Wt 158 lb 8 oz (71.895 kg)  BMI 30.95 kg/m2  SpO2 97%  Physical Exam  Constitutional: She is oriented to person, place, and time. She appears well-developed and well-nourished. No distress.  HENT:  Head: Normocephalic and atraumatic.  Right Ear: External ear normal.  Left Ear: External ear normal.  Eyes:  Glasses   Cardiovascular: Normal rate, regular rhythm, normal heart sounds and intact distal pulses.  Exam reveals no gallop and no friction rub.   No murmur heard. Pulmonary/Chest: Effort normal and breath sounds normal. No respiratory distress. She has no wheezes. She has no rales. She exhibits no tenderness.  Musculoskeletal:  Kyphosis and scoliosis  Neurological: She is alert and oriented to person, place, and time. No cranial nerve deficit. She exhibits normal muscle tone. Coordination normal.  Skin: Skin is warm and dry. No rash noted. She is not diaphoretic.  Rash on arm has improved  Psychiatric: She has a normal mood and affect. Her behavior is normal. Judgment and thought content normal.   Assessment & Plan:   Lindsay Tran was seen today for follow-up.  Diagnoses and all orders for this visit:  Rash and nonspecific skin eruption  Other orders -     clobetasol ointment (TEMOVATE) 0.05 %; Apply 1 application topically 2 (two) times daily. -     clobetasol (OLUX) 0.05 % topical foam; Apply topically 2 (two) times daily.  I have discontinued Lindsay Tran ciprofloxacin. I am also having her start on clobetasol. Additionally, I am having her maintain her aspirin, omega-3 acid ethyl esters, saccharomyces boulardii, vitamin C, cholecalciferol, vitamin B-12, potassium chloride, cholestyramine light, OSTEO BI-FLEX/5-LOXIN ADVANCED, NAPROXEN PO, multivitamin, (FIBER, CORN DEXTRIN, PO), HM GLUCOSAMINE & VITAMIN D3, fenofibrate, simvastatin, metoprolol, omeprazole, and clobetasol ointment.  Meds ordered this encounter  Medications  . omeprazole (PRILOSEC) 40 MG capsule    Sig: Take 40 mg by mouth daily.  . clobetasol ointment (TEMOVATE) 0.05 %    Sig: Apply 1 application topically 2 (two) times daily.    Dispense:  180 g    Refill:  1    3 tubes for a 3 month supply    Order Specific Question:  Supervising Provider    Answer:  Deborra Medina L [[id number]]  . clobetasol (OLUX) 0.05 % topical foam    Sig:  Apply topically 2 (two) times daily.    Dispense:  50 g    Refill:  0    3 month supply requested per pt    Order Specific Question:  Supervising Provider    Answer:  Crecencio Mc [[id number]]     Follow-up: Return for Needs a Wellness visit scheduled! Marland Kitchen

## 2015-08-26 NOTE — Telephone Encounter (Signed)
Please advise 

## 2015-08-26 NOTE — Progress Notes (Signed)
Pre visit review using our clinic review tool, if applicable. No additional management support is needed unless otherwise documented below in the visit note. 

## 2015-08-26 NOTE — Patient Instructions (Addendum)
Try the foam- if I need to re-write the prescription let me know   The cream was written for 3 months.

## 2015-08-26 NOTE — Telephone Encounter (Signed)
Patient state that she was to have a referral to a dietician.  Please Advise   Pt. Contact [phone]

## 2015-08-26 NOTE — Assessment & Plan Note (Signed)
Improved Refill clobetasol ointment for 3 months and to pharmacy Clobetasol foam for scalp was sent in as well Follow-up if worsening or failure to improve

## 2015-08-26 NOTE — Telephone Encounter (Signed)
Referral placed.

## 2015-08-27 ENCOUNTER — Other Ambulatory Visit: Payer: Self-pay

## 2015-08-27 MED ORDER — CLOBETASOL PROPIONATE 0.05 % EX FOAM: Freq: Two times a day (BID) | CUTANEOUS | Status: DC

## 2015-09-30 ENCOUNTER — Ambulatory Visit (INDEPENDENT_AMBULATORY_CARE_PROVIDER_SITE_OTHER): Payer: PPO

## 2015-09-30 VITALS — BP 122/72 | HR 63 | Temp 97.7°F | Resp 14 | Ht 60.0 in | Wt 159.8 lb

## 2015-09-30 DIAGNOSIS — Z23 Encounter for immunization: Secondary | ICD-10-CM | POA: Diagnosis not present

## 2015-09-30 DIAGNOSIS — Z1159 Encounter for screening for other viral diseases: Secondary | ICD-10-CM | POA: Diagnosis not present

## 2015-09-30 DIAGNOSIS — Z0000 Encounter for general adult medical examination without abnormal findings: Secondary | ICD-10-CM

## 2015-09-30 DIAGNOSIS — E2839 Other primary ovarian failure: Secondary | ICD-10-CM | POA: Diagnosis not present

## 2015-09-30 NOTE — Progress Notes (Signed)
Subjective:   Lindsay Tran is a 70 y.o. female who presents for an Initial Medicare Annual Wellness Visit.  Review of Systems    No ROS.  Medicare Wellness Visit.  Cardiac Risk Factors include: advanced age (>48men, >11 women);hypertension     Objective:    Today's Vitals   09/30/15 1016  BP: 122/72  Pulse: 63  Temp: 97.7 F (36.5 C)  TempSrc: Oral  Resp: 14  Height: 5' (1.524 m)  Weight: 159 lb 12.8 oz (72.485 kg)  SpO2: 96%   Body mass index is 31.21 kg/(m^2).   Current Medications (verified) Outpatient Encounter Prescriptions as of 09/30/2015  Medication Sig  . Ascorbic Acid (VITAMIN C) 100 MG tablet Take 100 mg by mouth daily.  Marland Kitchen aspirin 81 MG tablet Take 81 mg by mouth daily.  . Boswellia-Glucosamine-Vit D (HM GLUCOSAMINE & VITAMIN D3) TABS Take by mouth. Reported on 08/26/2015  . cholecalciferol (VITAMIN D) 400 UNITS TABS tablet Take 400 Units by mouth. Reported on 08/26/2015  . cholestyramine light 4 G POWD Take by mouth 1 day or 1 dose.  . clobetasol (OLUX) 0.05 % topical foam Apply topically 2 (two) times daily. Please dispense 3 bottles.  . clobetasol ointment (TEMOVATE) AB-123456789 % Apply 1 application topically 2 (two) times daily.  . fenofibrate 160 MG tablet Take 1 tablet (160 mg total) by mouth daily.  . metoprolol (LOPRESSOR) 100 MG tablet Take 1 tablet (100 mg total) by mouth 2 (two) times daily.  . Misc Natural Products (OSTEO BI-FLEX/5-LOXIN ADVANCED) TABS Take 1 tablet by mouth 2 (two) times daily.  . Multiple Vitamin (MULTIVITAMIN) capsule Take 1 capsule by mouth daily.  Marland Kitchen NAPROXEN PO Take by mouth 2 (two) times daily.  Marland Kitchen omeprazole (PRILOSEC) 40 MG capsule Take 40 mg by mouth daily.  . potassium chloride (K-DUR) 10 MEQ tablet Take 10 mEq by mouth daily.  . Probiotic Product (PROBIOTIC DAILY) CAPS Take 1 capsule by mouth.  . simvastatin (ZOCOR) 40 MG tablet Take 1 tablet (40 mg total) by mouth daily at 6 PM.  . vitamin B-12 (CYANOCOBALAMIN) 100 MCG  tablet Take 100 mcg by mouth daily. Reported on 08/26/2015  . [DISCONTINUED] FIBER, CORN DEXTRIN, PO Take by mouth 2 (two) times daily. Reported on 08/26/2015  . [DISCONTINUED] omega-3 acid ethyl esters (LOVAZA) 1 G capsule Take by mouth daily. Reported on 08/26/2015  . [DISCONTINUED] saccharomyces boulardii (FLORASTOR) 250 MG capsule Take 250 mg by mouth 2 (two) times daily. Reported on 08/26/2015   No facility-administered encounter medications on file as of 09/30/2015.    Allergies (verified) Review of patient's allergies indicates no known allergies.   History: Past Medical History  Diagnosis Date  . GERD (gastroesophageal reflux disease)   . Hypertension   . Gastric ulcer 1980's  . Scoliosis   . Schatzki's ring 05/02/2013  . High cholesterol   . Anxiety     adjustment disorder with anxiety  . Complication of anesthesia    Past Surgical History  Procedure Laterality Date  . Diagnostic laparoscopy      paraesphogeal hernia  . Esophagogastroduodenoscopy    . Cholecystectomy    . Colonoscopy    . Cardiac catheterization    . Hernia repair      inguinal hernia  . Abdominal hysterectomy    . Colonoscopy N/A 11/12/2014    Procedure: COLONOSCOPY;  Surgeon: Manya Silvas, MD;  Location: [facility] ENDOSCOPY;  Service: Endoscopy;  Laterality: N/A;   Family History  Problem Relation  Age of Onset  . Diabetes Mother   . Parkinson's disease Mother   . Cancer Father     Pancreatic  . Cancer Sister     Lung  . Diabetes Sister   . Alzheimer's disease Sister   . Alzheimer's disease Maternal Aunt   . Alzheimer's disease Paternal Aunt   . Diabetes Sister    Social History   Occupational History  . Not on file.   Social History Main Topics  . Smoking status: Never Smoker   . Smokeless tobacco: Not on file  . Alcohol Use: No  . Drug Use: No  . Sexual Activity: No    Tobacco Counseling Counseling given: Not Answered   Activities of Daily Living In your present state of  health, do you have any difficulty performing the following activities: 09/30/2015 07/21/2015  Hearing? N N  Vision? N N  Difficulty concentrating or making decisions? N N  Walking or climbing stairs? Y N  Dressing or bathing? N N  Doing errands, shopping? N N  Preparing Food and eating ? N -  Using the Toilet? N -  In the past six months, have you accidently leaked urine? N -  Do you have problems with loss of bowel control? Y -  Managing your Medications? N -  Managing your Finances? N -  Housekeeping or managing your Housekeeping? N -    Immunizations and Health Maintenance Immunization History  Administered Date(s) Administered  . Influenza-Unspecified 04/14/2015  . Pneumococcal Conjugate-13 09/30/2015  . Pneumococcal-Unspecified 07/17/2011  . Tdap 07/21/2015  . Zoster 08/17/2011   Health Maintenance Due  Topic Date Due  . DEXA SCAN  08/14/2008    Patient Care Team: Rubbie Battiest, NP as PCP - General (Gerontology)  Indicate any recent Medical Services you may have received from other than [facility] providers in the past year (date may be approximate).     Assessment:   This is a routine wellness examination for [name]. The goal of the wellness visit is to assist the patient how to close the gaps in care and create a preventative care plan for the patient.   Taking VIT D3  Calcium as appropriate/Osteoporosis risk reviewed.   DEXA Scan scheduled.  Educational material provided.  Medications reviewed; taking without issues or barriers.  Safety issues reviewed; smoke detectors in the home. No firearms in the home. Wears seatbelts when driving or riding with others. No violence in the home.  No identified risk were noted; The patient was oriented x 3; appropriate in dress and manner and no objective failures at ADL's or IADL's.    Prevnar 13 administered, L deltoid.  Tolerated well.  Educational material provided.  Hepatitis C Screening completed.  Educational material  provided.  Patient Concerns:  None at this time.  Follow up with PCP as needed.  Hearing/Vision screen Hearing Screening Comments: Passes the whisper test Vision Screening Comments: Followed by [facility] Wears glasses Annual visits  Dietary issues and exercise activities discussed: Current Exercise Habits: Structured exercise class, Type of exercise: calisthenics;walking, Time (Minutes): 60, Frequency (Times/Week): 2, Weekly Exercise (Minutes/Week): 120, Intensity: Mild  Goals    . Healthy Lifestyle     Maintain exercise regiment at the [facility]. Stay hydrated, drink plenty of water. Low carb diet.  Lean meats, fruits and vegetables.      Depression Screen PHQ 2/9 Scores 09/30/2015 07/21/2015  PHQ - 2 Score 0 0    Fall Risk Fall Risk  09/30/2015 07/21/2015  Falls in the past year? No No    Cognitive Function: MMSE - Mini Mental State Exam 09/30/2015  Orientation to time 5  Orientation to Place 5  Registration 3  Attention/ Calculation 5  Recall 3  Language- name 2 objects 2  Language- repeat 1  Language- follow 3 step command 3  Language- read & follow direction 1  Write a sentence 1  Copy design 1  Total score 30    Screening Tests Health Maintenance  Topic Date Due  . DEXA SCAN  08/14/2008  . INFLUENZA VACCINE  02/08/2016  . PNA vac Low Risk Adult (2 of 2 - PPSV23) 09/29/2016  . MAMMOGRAM  03/24/2017  . COLONOSCOPY  11/11/2024  . TETANUS/TDAP  07/20/2025  . ZOSTAVAX  Addressed  . Hepatitis C Screening  Completed      Plan:   End of life planning; Advance aging; Advanced directives discussed. Copy of current HCPOA/Living Will requested.    During the course of the visit, Jone was educated and counseled about the following appropriate screening and preventive services:   Vaccines to include Pneumoccal, Influenza, Hepatitis B, Td, Zostavax, HCV  Electrocardiogram  Cardiovascular disease screening  Colorectal cancer screening  Bone  density screening  Diabetes screening  Glaucoma screening  Mammography/PAP  Nutrition counseling  Smoking cessation counseling  Patient Instructions (the written plan) were given to the patient.    Varney Biles, LPN   [phone]

## 2015-09-30 NOTE — Patient Instructions (Addendum)
  Lindsay Tran , Thank you for taking time to come for your Medicare Wellness Visit. I appreciate your ongoing commitment to your health goals. Please review the following plan we discussed and let me know if I can assist you in the future.   Follow up with Lindsay Tran as needed.   This is a list of the screening recommended for you and due dates:  Health Maintenance  Topic Date Due  . DEXA scan (bone density measurement)  08/14/2008  . Flu Shot  02/08/2016  . Pneumonia vaccines (2 of 2 - PPSV23) 09/29/2016  . Mammogram  03/24/2017  . Colon Cancer Screening  11/11/2024  . Tetanus Vaccine  07/20/2025  . Shingles Vaccine  Addressed  .  Hepatitis C: One time screening is recommended by Center for Disease Control  (CDC) for  adults born from [birth year] through 1967.   Completed    Bone Densitometry Bone densitometry is an imaging test that uses a special X-ray to measure the amount of calcium and other minerals in your bones (bone density). This test is also known as a bone mineral density test or dual-energy X-ray absorptiometry (DXA). The test can measure bone density at your hip and your spine. It is similar to having a regular X-ray. You may have this test to:  Diagnose a condition that causes weak or thin bones (osteoporosis).  Predict your risk of a broken bone (fracture).  Determine how well osteoporosis treatment is working. LET [facility] CARE PROVIDER KNOW ABOUT:  Any allergies you have.  All medicines you are taking, including vitamins, herbs, eye drops, creams, and over-the-counter medicines.  Previous problems you or members of your family have had with the use of anesthetics.  Any blood disorders you have.  Previous surgeries you have had.  Medical conditions you have.  Possibility of pregnancy.  Any other medical test you had within the previous 14 days that used contrast material. RISKS AND COMPLICATIONS Generally, this is a safe procedure. However, problems can occur and  may include the following:  This test exposes you to a very small amount of radiation.  The risks of radiation exposure may be greater to unborn children. BEFORE THE PROCEDURE  Do not take any calcium supplements for 24 hours before having the test. You can otherwise eat and drink what you usually do.  Take off all metal jewelry, eyeglasses, dental appliances, and any other metal objects. PROCEDURE  You may lie on an exam table. There will be an X-ray generator below you and an imaging device above you.  Other devices, such as boxes or braces, may be used to position your body properly for the scan.  You will need to lie still while the machine slowly scans your body.  The images will show up on a computer monitor. AFTER THE PROCEDURE You may need more testing at a later time.   This information is not intended to replace advice given to you by your health care provider. Make sure you discuss any questions you have with your health care provider.   Document Released: 07/18/2004 Document Revised: 07/17/2014 Document Reviewed: 12/04/2013 Elsevier Interactive Patient Education [employer].

## 2015-10-01 LAB — HEPATITIS C ANTIBODY: HCV Ab: NEGATIVE

## 2015-10-01 NOTE — Progress Notes (Signed)
I have read and agree with the visit with our Health Coach.   Lorane Gell, AGNP-C   [employer] [facility] [employer]

## 2015-10-12 ENCOUNTER — Ambulatory Visit
Admission: RE | Admit: 2015-10-12 | Discharge: 2015-10-12 | Disposition: A | Discharge status: Home / Self Care | Payer: PPO | Source: Ambulatory Visit | Attending: Nurse Practitioner | Admitting: Nurse Practitioner

## 2015-10-12 DIAGNOSIS — Z13820 Encounter for screening for osteoporosis: Secondary | ICD-10-CM | POA: Diagnosis not present

## 2015-10-12 DIAGNOSIS — E2839 Other primary ovarian failure: Secondary | ICD-10-CM

## 2015-10-12 DIAGNOSIS — M8580 Other specified disorders of bone density and structure, unspecified site: Secondary | ICD-10-CM | POA: Diagnosis not present

## 2015-10-15 ENCOUNTER — Ambulatory Visit: Payer: PPO | Admitting: Dietician

## 2015-12-23 ENCOUNTER — Ambulatory Visit: Payer: PPO | Admitting: Nurse Practitioner

## 2015-12-24 ENCOUNTER — Encounter: Payer: Self-pay | Admitting: Family Medicine

## 2015-12-24 ENCOUNTER — Ambulatory Visit (INDEPENDENT_AMBULATORY_CARE_PROVIDER_SITE_OTHER): Payer: PPO | Admitting: Family Medicine

## 2015-12-24 VITALS — BP 126/78 | HR 62 | Temp 97.8°F | Ht 60.0 in | Wt 158.4 lb

## 2015-12-24 DIAGNOSIS — L409 Psoriasis, unspecified: Secondary | ICD-10-CM | POA: Diagnosis not present

## 2015-12-24 DIAGNOSIS — I10 Essential (primary) hypertension: Secondary | ICD-10-CM | POA: Diagnosis not present

## 2015-12-24 DIAGNOSIS — H8110 Benign paroxysmal vertigo, unspecified ear: Secondary | ICD-10-CM | POA: Insufficient documentation

## 2015-12-24 DIAGNOSIS — H8113 Benign paroxysmal vertigo, bilateral: Secondary | ICD-10-CM

## 2015-12-24 DIAGNOSIS — R21 Rash and other nonspecific skin eruption: Secondary | ICD-10-CM | POA: Diagnosis not present

## 2015-12-24 MED ORDER — OMEPRAZOLE 40 MG PO CPDR: 40.0000 mg | DELAYED_RELEASE_CAPSULE | Freq: Every day | ORAL | Status: DC

## 2015-12-24 MED ORDER — MECLIZINE HCL 25 MG PO TABS: 25.0000 mg | ORAL_TABLET | Freq: Two times a day (BID) | ORAL | Status: DC | PRN

## 2015-12-24 NOTE — Patient Instructions (Signed)
Nice to meet you. Your dizziness is likely related to benign paroxysmal positional vertigo. We will have a do the Epley maneuvers at home to help with this. You can take the meclizine if your dizziness comes on and is not improving. If this continues to be an issue please let us know. We will refer you to dermatology as well. If you develop persistent dizziness, numbness, weakness, vision changes, hearing changes, or any new or changing symptoms please seek medical attention.

## 2015-12-24 NOTE — Assessment & Plan Note (Addendum)
Vertigo symptoms most consistent with BPPV. Neurologically intact. Advised on Epley maneuver bilaterally. Meclizine as needed and did discuss potential side effects. Given return precautions.

## 2015-12-24 NOTE — Progress Notes (Signed)
Pre visit review using our clinic review tool, if applicable. No additional management support is needed unless otherwise documented below in the visit note. 

## 2015-12-24 NOTE — Progress Notes (Signed)
Patient ID: Lindsay Tran, female   DOB: 1945-08-19, 70 y.o.   MRN: [id number]  Tommi Rumps, MD Phone: [phone]  Lindsay Tran is a 70 y.o. female who presents today for follow-up.  Vertigo: Patient notes for the last several weeks she has had issues with vertigo. Feels as though the room is spinning when she sits up in bed in the morning. Notes it is worse if she has slept on her back. No lightheadedness on standing. Only occurs with movement of her head. Occasionally feels swimmy. No hearing changes. Some mild fullness in her right ear. She does report chronic tinnitus.  Rash: She notes this is better. Is using clobetasol cream and to follow. Zyrtec has helped. Occasionally gets a flare. Does have a history of psoriasis like to see a dermatologist.  HYPERTENSION Disease Monitoring Home BP Monitoring not checking Chest pain- no    Dyspnea- no Medications Compliance-  taking metoprolol.  Edema- minimal by the end of the day. No orthopnea. Recent lab work unremarkable.  PMH: nonsmoker.   ROS see history of present illness  Objective  Physical Exam Filed Vitals:   12/24/15 0955  BP: 126/78  Pulse: 62  Temp: 97.8 F (36.6 C)    BP Readings from Last 3 Encounters:  12/24/15 126/78  09/30/15 122/72  08/26/15 110/60   Wt Readings from Last 3 Encounters:  12/24/15 158 lb 6.4 oz (71.85 kg)  09/30/15 159 lb 12.8 oz (72.485 kg)  08/26/15 158 lb 8 oz (71.895 kg)    Physical Exam  Constitutional: She is well-developed, well-nourished, and in no distress.  HENT:  Head: Normocephalic and atraumatic.  Right Ear: External ear normal.  Left Ear: External ear normal.  Mouth/Throat: Oropharynx is clear and moist. No oropharyngeal exudate.  Normal right TM, left TM with partial wax obscuring  Eyes: Conjunctivae are normal. Pupils are equal, round, and reactive to light.  Cardiovascular: Normal rate, regular rhythm and normal heart sounds.   Pulmonary/Chest: Effort normal  and breath sounds normal.  Musculoskeletal: She exhibits no edema.  Neurological: She is alert. Gait normal.  CN 2-12 intact, 5/5 strength in bilateral biceps, triceps, grip, quads, hamstrings, plantar and dorsiflexion, sensation to light touch intact in bilateral UE and LE, normal gait, 2+ patellar reflexes, negative Romberg, no pronator drift, Dix Hallpike with spinning bilaterally though no nystagmus, did have vertigo on sitting up with movement of her head  Skin: Skin is warm and dry. She is not diaphoretic.     Assessment/Plan: Please see individual problem list.  BPPV (benign paroxysmal positional vertigo) Vertigo symptoms most consistent with BPPV. Neurologically intact. Advised on Epley maneuver bilaterally. Meclizine as needed and did discuss potential side effects. Given return precautions.  Benign essential HTN At goal. Continue metoprolol.  Rash and nonspecific skin eruption Continue clobetasol. Refer to dermatology.    Orders Placed This Encounter  Procedures  . Ambulatory referral to Dermatology    Referral Priority:  Routine    Referral Type:  Consultation    Referral Reason:  Specialty Services Required    Requested Specialty:  Dermatology    Number of Visits Requested:  1    Meds ordered this encounter  Medications  . omeprazole (PRILOSEC) 40 MG capsule    Sig: Take 1 capsule (40 mg total) by mouth daily.    Dispense:  90 capsule    Refill:  1  . meclizine (ANTIVERT) 25 MG tablet    Sig: Take 1 tablet (25 mg total) by  mouth 2 (two) times daily as needed for dizziness.    Dispense:  20 tablet    Refill:  0    Tommi Rumps, MD [place]

## 2015-12-26 NOTE — Assessment & Plan Note (Signed)
Continue clobetasol. Refer to dermatology.

## 2015-12-26 NOTE — Assessment & Plan Note (Signed)
At goal. Continue metoprolol. 

## 2016-01-05 DIAGNOSIS — H2513 Age-related nuclear cataract, bilateral: Secondary | ICD-10-CM | POA: Diagnosis not present

## 2016-01-24 ENCOUNTER — Ambulatory Visit: Payer: PPO | Admitting: Family Medicine

## 2016-02-17 ENCOUNTER — Ambulatory Visit (INDEPENDENT_AMBULATORY_CARE_PROVIDER_SITE_OTHER): Payer: PPO | Admitting: Family Medicine

## 2016-02-17 VITALS — BP 128/62 | HR 66 | Temp 97.8°F | Resp 16 | Wt 160.0 lb

## 2016-02-17 DIAGNOSIS — R300 Dysuria: Secondary | ICD-10-CM | POA: Diagnosis not present

## 2016-02-17 DIAGNOSIS — N3001 Acute cystitis with hematuria: Secondary | ICD-10-CM | POA: Diagnosis not present

## 2016-02-17 DIAGNOSIS — N390 Urinary tract infection, site not specified: Secondary | ICD-10-CM | POA: Insufficient documentation

## 2016-02-17 LAB — POCT URINALYSIS DIPSTICK
BILIRUBIN UA: 1
Blood, UA: NEGATIVE
CLARITY UA: NEGATIVE
GLUCOSE UA: NEGATIVE
Ketones, UA: NEGATIVE
NITRITE UA: POSITIVE
Spec Grav, UA: >=1.03
Urobilinogen, UA: 0.2
pH, UA: 5.5

## 2016-02-17 LAB — URINALYSIS, MICROSCOPIC ONLY

## 2016-02-17 MED ORDER — CEPHALEXIN 500 MG PO CAPS: 500.0000 mg | ORAL_CAPSULE | Freq: Two times a day (BID) | ORAL | 0 refills | Status: DC

## 2016-02-17 NOTE — Progress Notes (Signed)
Pre visit review using our clinic review tool, if applicable. No additional management support is needed unless otherwise documented below in the visit note. 

## 2016-02-17 NOTE — Patient Instructions (Signed)
Nice to see you. We will treat you for UTI with Keflex. We will call with results of urine culture. If you develop abdominal pain, fevers, chills, or any new or changing symptoms please seek medical attention.

## 2016-02-17 NOTE — Assessment & Plan Note (Addendum)
UA with positive leuk esterase, nitrites, and blood consistent with UTI given her symptoms. Benign abdominal exam. With lack of vaginal discharge doubt vaginal issue. We will proceed with treatment with Keflex and send urine for culture and microscopy. Advised on yogurt or probiotic with antibiotic. She is given return precautions.

## 2016-02-17 NOTE — Progress Notes (Signed)
  Tommi Rumps, MD Phone: [phone]  Lindsay Tran is a 70 y.o. female who presents today for same-day visit.  UTI: Patient notes about a week and a half of burning with urination and itching at her urethra only after urination. Notes some urgency. Also notes a bad odor to her urine in the morning though no other odors. No vaginal discharge. Some frequency later in the day. No fevers. Some mild suprapubic discomfort. Reports this is similar to her prior UTIs. She is not sexually active.  PMH: History of UTIs in the past   ROS see history of present illness  Objective  Physical Exam Vitals:   02/17/16 1329  BP: 128/62  Pulse: 66  Resp: 16  Temp: 97.8 F (36.6 C)    BP Readings from Last 3 Encounters:  02/17/16 128/62  12/24/15 126/78  09/30/15 122/72   Wt Readings from Last 3 Encounters:  02/17/16 160 lb (72.6 kg)  12/24/15 158 lb 6.4 oz (71.8 kg)  09/30/15 159 lb 12.8 oz (72.5 kg)    Physical Exam  Constitutional: No distress.  Cardiovascular: Normal rate, regular rhythm and normal heart sounds.   Pulmonary/Chest: Effort normal and breath sounds normal.  Abdominal: Soft. Bowel sounds are normal. She exhibits no distension. There is no tenderness. There is no rebound and no guarding.  Neurological: She is alert. Gait normal.  Skin: Skin is warm and dry. She is not diaphoretic.     Assessment/Plan: Please see individual problem list.  UTI (urinary tract infection) UA with positive leuk esterase, nitrites, and blood consistent with UTI given her symptoms. Benign abdominal exam. With lack of vaginal discharge doubt vaginal issue. We will proceed with treatment with Keflex and send urine for culture and microscopy. Advised on yogurt or probiotic with antibiotic. She is given return precautions.   Orders Placed This Encounter  Procedures  . Urine Culture  . Urine Microscopic Only  . POCT Urinalysis Dipstick    Meds ordered this encounter  Medications  .  cephALEXin (KEFLEX) 500 MG capsule    Sig: Take 1 capsule (500 mg total) by mouth 2 (two) times daily.    Dispense:  14 capsule    Refill:  0    Tommi Rumps, MD [place]

## 2016-02-18 ENCOUNTER — Encounter: Payer: Self-pay | Admitting: Nurse Practitioner

## 2016-02-19 LAB — URINE CULTURE

## 2016-03-16 ENCOUNTER — Other Ambulatory Visit: Payer: Self-pay | Admitting: Family Medicine

## 2016-03-16 DIAGNOSIS — Z1231 Encounter for screening mammogram for malignant neoplasm of breast: Secondary | ICD-10-CM

## 2016-03-31 ENCOUNTER — Ambulatory Visit (INDEPENDENT_AMBULATORY_CARE_PROVIDER_SITE_OTHER): Payer: PPO | Admitting: Family Medicine

## 2016-03-31 VITALS — BP 132/78 | HR 68 | Temp 97.7°F | Wt 160.2 lb

## 2016-03-31 DIAGNOSIS — Z23 Encounter for immunization: Secondary | ICD-10-CM | POA: Diagnosis not present

## 2016-03-31 DIAGNOSIS — R300 Dysuria: Secondary | ICD-10-CM | POA: Diagnosis not present

## 2016-03-31 DIAGNOSIS — K529 Noninfective gastroenteritis and colitis, unspecified: Secondary | ICD-10-CM | POA: Diagnosis not present

## 2016-03-31 DIAGNOSIS — N3001 Acute cystitis with hematuria: Secondary | ICD-10-CM | POA: Diagnosis not present

## 2016-03-31 LAB — POCT URINALYSIS DIPSTICK
Bilirubin, UA: NEGATIVE
Glucose, UA: NEGATIVE
KETONES UA: NEGATIVE
PH UA: 6
UROBILINOGEN UA: 0.2

## 2016-03-31 LAB — URINALYSIS, MICROSCOPIC ONLY

## 2016-03-31 MED ORDER — CEPHALEXIN 500 MG PO CAPS: 500.0000 mg | ORAL_CAPSULE | Freq: Two times a day (BID) | ORAL | 0 refills | Status: DC

## 2016-03-31 NOTE — Patient Instructions (Signed)
Nice to see you. You have a UTI. We will treat with Keflex. Please monitor your diarrhea with the cholestyramine. If it does not improve please let us know. If you develop fevers, blood in your stool, or any new or changing symptoms please seek medical attention.

## 2016-03-31 NOTE — Progress Notes (Signed)
Pre visit review using our clinic review tool, if applicable. No additional management support is needed unless otherwise documented below in the visit note. 

## 2016-03-31 NOTE — Assessment & Plan Note (Signed)
Slight worsening recently. Typically responds to cholestyramine. She'll continue to monitor and if does not improve she'll let us know. Given return precautions.

## 2016-03-31 NOTE — Progress Notes (Signed)
  Tommi Rumps, MD Phone: [phone]  Lindsay Tran is a 70 y.o. female who presents today for same-day visit.  Dysuria: Patient notes for the last week she's had burning with urination. Also urgency and frequency. No vaginal discharge. No fevers. Mild suprapubic tenderness with this. Treated about a month and a half ago for UTI and notes her symptoms mostly went away though not completely. Does note some gas and diarrhea as well. No vomiting or nausea. She has chronic issues with gas and diarrhea since she had her gallbladder taken out. Currently taking cholestyramine for this. Had a significant evaluation for this last year which she reports revealed that it was likely related to the absence of her gallbladder.  PMH: History of frequent UTIs. No allergies noted.  ROS see history of present illness  Objective  Physical Exam Vitals:   03/31/16 0949  BP: 132/78  Pulse: 68  Temp: 97.7 F (36.5 C)    BP Readings from Last 3 Encounters:  03/31/16 132/78  02/17/16 128/62  12/24/15 126/78   Wt Readings from Last 3 Encounters:  03/31/16 160 lb 3.2 oz (72.7 kg)  02/17/16 160 lb (72.6 kg)  12/24/15 158 lb 6.4 oz (71.8 kg)    Physical Exam  Constitutional: No distress.  Cardiovascular: Normal rate, regular rhythm and normal heart sounds.   Pulmonary/Chest: Effort normal and breath sounds normal.  Abdominal: Soft. Bowel sounds are normal. She exhibits no distension. There is no tenderness. There is no rebound and no guarding.  Musculoskeletal: She exhibits no edema.  Neurological: She is alert. Gait normal.  Skin: Skin is warm and dry. She is not diaphoretic.     Assessment/Plan: Please see individual problem list.  UTI (urinary tract infection) Symptoms and UA consistent with UTI. We'll start on Keflex. We'll send urine for culture and microscopy. She's given return precautions.  Chronic diarrhea Slight worsening recently. Typically responds to cholestyramine. She'll  continue to monitor and if does not improve she'll let us know. Given return precautions.   Orders Placed This Encounter  Procedures  . Urine Culture  . Urine Microscopic Only  . POCT Urinalysis Dipstick    Meds ordered this encounter  Medications  . cephALEXin (KEFLEX) 500 MG capsule    Sig: Take 1 capsule (500 mg total) by mouth 2 (two) times daily.    Dispense:  14 capsule    Refill:  0    Tommi Rumps, MD [place]

## 2016-03-31 NOTE — Assessment & Plan Note (Signed)
Symptoms and UA consistent with UTI. We'll start on Keflex. We'll send urine for culture and microscopy. She's given return precautions.

## 2016-04-02 LAB — URINE CULTURE

## 2016-04-03 ENCOUNTER — Other Ambulatory Visit: Payer: Self-pay | Admitting: Family Medicine

## 2016-04-03 MED ORDER — CEFDINIR 300 MG PO CAPS: 300.0000 mg | ORAL_CAPSULE | Freq: Two times a day (BID) | ORAL | 0 refills | Status: DC

## 2016-04-12 ENCOUNTER — Ambulatory Visit: Payer: PPO

## 2016-05-02 ENCOUNTER — Ambulatory Visit: Payer: PPO

## 2016-05-26 ENCOUNTER — Ambulatory Visit
Admission: RE | Admit: 2016-05-26 | Discharge: 2016-05-26 | Disposition: A | Discharge status: Home / Self Care | Payer: PPO | Source: Ambulatory Visit | Attending: Family Medicine | Admitting: Family Medicine

## 2016-05-26 DIAGNOSIS — Z1231 Encounter for screening mammogram for malignant neoplasm of breast: Secondary | ICD-10-CM | POA: Diagnosis not present

## 2016-06-05 DIAGNOSIS — L280 Lichen simplex chronicus: Secondary | ICD-10-CM | POA: Diagnosis not present

## 2016-06-05 DIAGNOSIS — L820 Inflamed seborrheic keratosis: Secondary | ICD-10-CM | POA: Diagnosis not present

## 2016-06-05 DIAGNOSIS — L400 Psoriasis vulgaris: Secondary | ICD-10-CM | POA: Diagnosis not present

## 2016-06-05 DIAGNOSIS — I8393 Asymptomatic varicose veins of bilateral lower extremities: Secondary | ICD-10-CM | POA: Diagnosis not present

## 2016-06-05 DIAGNOSIS — Z1283 Encounter for screening for malignant neoplasm of skin: Secondary | ICD-10-CM | POA: Diagnosis not present

## 2016-06-05 DIAGNOSIS — D1800 Hemangioma unspecified site: Secondary | ICD-10-CM | POA: Diagnosis not present

## 2016-06-05 DIAGNOSIS — L304 Erythema intertrigo: Secondary | ICD-10-CM | POA: Diagnosis not present

## 2016-06-05 DIAGNOSIS — L720 Epidermal cyst: Secondary | ICD-10-CM | POA: Diagnosis not present

## 2016-06-22 ENCOUNTER — Telehealth: Payer: Self-pay | Admitting: Nurse Practitioner

## 2016-06-22 NOTE — Telephone Encounter (Signed)
Pt scheduled appt for physical on 2/26. She would like to have some lab work done before her appt. Please advise, thank you!  Call pt @ [phone]

## 2016-06-22 NOTE — Telephone Encounter (Signed)
Please advise 

## 2016-06-25 NOTE — Telephone Encounter (Signed)
I typically do not order lab work prior to the physical, though if patient would prefer this I can order standard lab work for her to obtain one week prior to the physical.

## 2016-06-26 NOTE — Telephone Encounter (Signed)
Patient notified and will wait

## 2016-08-07 ENCOUNTER — Other Ambulatory Visit: Payer: Self-pay | Admitting: Nurse Practitioner

## 2016-08-07 ENCOUNTER — Other Ambulatory Visit: Payer: Self-pay | Admitting: Family Medicine

## 2016-08-07 NOTE — Telephone Encounter (Signed)
Last filled by Lorane Gell  07/21/15

## 2016-09-04 ENCOUNTER — Ambulatory Visit (INDEPENDENT_AMBULATORY_CARE_PROVIDER_SITE_OTHER): Payer: PPO

## 2016-09-04 ENCOUNTER — Other Ambulatory Visit: Payer: Self-pay | Admitting: Family Medicine

## 2016-09-04 ENCOUNTER — Encounter: Payer: Self-pay | Admitting: Family Medicine

## 2016-09-04 ENCOUNTER — Ambulatory Visit (INDEPENDENT_AMBULATORY_CARE_PROVIDER_SITE_OTHER): Payer: PPO | Admitting: Family Medicine

## 2016-09-04 VITALS — BP 112/72 | HR 62 | Temp 98.5°F | Ht 60.0 in | Wt 161.0 lb

## 2016-09-04 DIAGNOSIS — G8929 Other chronic pain: Secondary | ICD-10-CM | POA: Diagnosis not present

## 2016-09-04 DIAGNOSIS — Z13220 Encounter for screening for lipoid disorders: Secondary | ICD-10-CM | POA: Diagnosis not present

## 2016-09-04 DIAGNOSIS — M25561 Pain in right knee: Secondary | ICD-10-CM | POA: Diagnosis not present

## 2016-09-04 DIAGNOSIS — R600 Localized edema: Secondary | ICD-10-CM | POA: Insufficient documentation

## 2016-09-04 DIAGNOSIS — M1711 Unilateral primary osteoarthritis, right knee: Secondary | ICD-10-CM | POA: Diagnosis not present

## 2016-09-04 DIAGNOSIS — Z0001 Encounter for general adult medical examination with abnormal findings: Secondary | ICD-10-CM | POA: Insufficient documentation

## 2016-09-04 DIAGNOSIS — R1310 Dysphagia, unspecified: Secondary | ICD-10-CM

## 2016-09-04 LAB — COMPREHENSIVE METABOLIC PANEL
ALBUMIN: 4.1 g/dL (ref 3.5–5.2)
ALK PHOS: 40 U/L (ref 39–117)
ALT: 10 U/L (ref 0–35)
AST: 20 U/L (ref 0–37)
BILIRUBIN TOTAL: 0.6 mg/dL (ref 0.2–1.2)
BUN: 17 mg/dL (ref 6–23)
CO2: 27 mEq/L (ref 19–32)
Calcium: 9.7 mg/dL (ref 8.4–10.5)
Chloride: 100 mEq/L (ref 96–112)
Creatinine, Ser: 0.89 mg/dL (ref 0.40–1.20)
GFR: 66.07 mL/min (ref >60.00)
GLUCOSE: 97 mg/dL (ref 70–99)
Potassium: 5 mEq/L (ref 3.5–5.1)
SODIUM: 136 meq/L (ref 135–145)
TOTAL PROTEIN: 6.7 g/dL (ref 6.0–8.3)

## 2016-09-04 LAB — CBC
HCT: 36.5 % (ref 36.0–46.0)
HEMOGLOBIN: 12.1 g/dL (ref 12.0–15.0)
MCHC: 33 g/dL (ref 30.0–36.0)
MCV: 83.7 fl (ref 78.0–100.0)
Platelets: 482 10*3/uL — ABNORMAL HIGH (ref 150.0–400.0)
RBC: 4.37 Mil/uL (ref 3.87–5.11)
RDW: 13.7 % (ref 11.5–15.5)
WBC: 6.4 10*3/uL (ref 4.0–10.5)

## 2016-09-04 LAB — LIPID PANEL
CHOLESTEROL: 120 mg/dL (ref 0–200)
HDL: 37.7 mg/dL — ABNORMAL LOW (ref >39.00)
LDL CALC: 56 mg/dL (ref 0–99)
NonHDL: 82.51
TRIGLYCERIDES: 135 mg/dL (ref 0.0–149.0)
Total CHOL/HDL Ratio: 3
VLDL: 27 mg/dL (ref 0.0–40.0)

## 2016-09-04 LAB — TSH: TSH: 2.06 u[IU]/mL (ref 0.35–4.50)

## 2016-09-04 LAB — HEMOGLOBIN A1C: Hgb A1c MFr Bld: 5.9 % (ref 4.6–6.5)

## 2016-09-04 NOTE — Patient Instructions (Signed)
Nice to see you. Please try to work on exercise. We'll get you to see GI for your swallowing issues. We'll get an x-ray of your right knee. We'll obtain some lab work and contact you with the results.

## 2016-09-04 NOTE — Progress Notes (Signed)
Pre visit review using our clinic review tool, if applicable. No additional management support is needed unless otherwise documented below in the visit note. 

## 2016-09-04 NOTE — Assessment & Plan Note (Signed)
Suspect osteoarthritis though given psoriasis history we will obtain x-rays to evaluate for findings of osteoarthritis versus other arthropathy.

## 2016-09-04 NOTE — Progress Notes (Signed)
Tommi Rumps, MD Phone: [phone]  Lindsay Tran is a 71 y.o. female who presents today for physical exam.  Does not exercise. Tries to eat healthily with fruits and vegetables and lean meats. Status post hysterectomy. Colonoscopy and mammograms up-to-date. Vaccinations up-to-date. Hepatitis C testing up-to-date. No tobacco use, alcohol use, or illicit drug use.  Patient notes issues with swallowing large pieces of food. She had an EGD several years ago that revealed a hiatal hernia and she reports they "stretched my esophagus.". Notes if she eats small bites of food or drinks liquids she has no issues.  Patient reports she feels as though she wheezes if she slumps her head over and her neck is bent. She notes no shortness of breath or trouble breathing with this. She notes no chest pain. No orthopnea or PND. Only occurs when she leans her head down. No issues when walking around.  Patient notes right knee pain that has been giving her trouble for the last several months. It is worse if she is not moving it. It is also occasionally sore to touch. Occasionally it will catch on her though does not give out. Does have a history of psoriasis.  Pedal edema: Notes she's had this intermittently for years. He goes down if she props her legs up. Goes down at night. Notes no orthopnea or PND.   Active Ambulatory Problems    Diagnosis Date Noted  . Hyperlipidemia 07/27/2015  . Benign essential HTN 07/27/2015  . Adjustment disorder with anxiety 12/27/2006  . Chronic diarrhea 12/17/2014  . CAD in native artery 09/06/2012  . Acid reflux 09/06/2012  . Family history of colon cancer 12/18/2014  . Gastroesophageal hernia 05/22/2013  . Rash and nonspecific skin eruption 07/27/2015  . Abdominal pain 08/03/2015  . BPPV (benign paroxysmal positional vertigo) 12/24/2015  . UTI (urinary tract infection) 02/17/2016  . Encounter for general adult medical examination with abnormal findings  09/04/2016  . Pedal edema 09/04/2016  . Dysphagia 09/04/2016  . Chronic pain of right knee 09/04/2016   Resolved Ambulatory Problems    Diagnosis Date Noted  . Need for Tdap vaccination 07/27/2015  . Encounter to establish care 07/27/2015  . Suprapubic pain 08/03/2015   Past Medical History:  Diagnosis Date  . Anxiety   . Complication of anesthesia   . Gastric ulcer 1980's  . GERD (gastroesophageal reflux disease)   . High cholesterol   . Hypertension   . Schatzki's ring 05/02/2013  . Scoliosis     Family History  Problem Relation Age of Onset  . Diabetes Mother   . Parkinson's disease Mother   . Cancer Father     Pancreatic  . Cancer Sister     Lung  . Diabetes Sister   . Alzheimer's disease Sister   . Alzheimer's disease Maternal Aunt   . Alzheimer's disease Paternal Aunt   . Diabetes Sister   . Breast cancer Neg Hx     Social History   Social History  . Marital status: Single    Spouse name: N/A  . Number of children: N/A  . Years of education: N/A   Occupational History  . Not on file.   Social History Main Topics  . Smoking status: Never Smoker  . Smokeless tobacco: Never Used  . Alcohol use No  . Drug use: No  . Sexual activity: No   Other Topics Concern  . Not on file   Social History Narrative   Divorced   Retired as a  Scientist, research (medical)   12 + years of school     ROS  General:  Negative for nexplained weight loss, fever Skin: Negative for new or changing mole, sore that won't heal HEENT: Positive for dysphagia, Negative for trouble hearing, trouble seeing, ringing in ears, mouth sores, hoarseness, change in voice. CV:  Positive for edema, Negative for chest pain, dyspnea, palpitations Resp: Negative for cough, dyspnea, hemoptysis GI: Negative for nausea, vomiting, diarrhea, constipation, abdominal pain, melena, hematochezia. GU: Negative for dysuria, incontinence, urinary hesitance, hematuria, vaginal or penile discharge, polyuria, sexual  difficulty, lumps in testicle or breasts MSK: Negative for muscle cramps or aches, positive for joint pain or swelling Neuro: Negative for headaches, weakness, numbness, dizziness, passing out/fainting Psych: Negative for depression, anxiety, memory problems  Objective  Physical Exam Vitals:   09/04/16 1010  BP: 112/72  Pulse: 62  Temp: 98.5 F (36.9 C)    BP Readings from Last 3 Encounters:  09/04/16 112/72  03/31/16 132/78  02/17/16 128/62   Wt Readings from Last 3 Encounters:  09/04/16 161 lb (73 kg)  03/31/16 160 lb 3.2 oz (72.7 kg)  02/17/16 160 lb (72.6 kg)    Physical Exam  Constitutional: No distress.  HENT:  Head: Normocephalic and atraumatic.  Mouth/Throat: Oropharynx is clear and moist. No oropharyngeal exudate.  Eyes: Conjunctivae are normal. Pupils are equal, round, and reactive to light.  Cardiovascular: Normal rate, regular rhythm and normal heart sounds.   Pulmonary/Chest: Effort normal and breath sounds normal.  Abdominal: Soft. Bowel sounds are normal. She exhibits no distension. There is no tenderness. There is no rebound and no guarding.  Genitourinary:  Genitourinary Comments: Bilateral breasts with no nipple changes, skin changes, or palpable masses, bilateral axilla with no masses  Musculoskeletal: She exhibits no edema.  Neurological: She is alert. Gait normal.  Skin: Skin is warm and dry. She is not diaphoretic.  Psychiatric: Mood and affect normal.     Assessment/Plan:   Encounter for general adult medical examination with abnormal findings Physical exam completed. Encouraged diet and exercise. Colonoscopy and mammograms up-to-date. Breast exam completed. Pelvic exam deferred given age and that she is status post hysterectomy. Vaccinations up-to-date. Lab work as outlined below.  Chronic pain of right knee Suspect osteoarthritis though given psoriasis history we will obtain x-rays to evaluate for findings of osteoarthritis versus other  arthropathy.  Pedal edema Suspect venous insufficiency. No edema on exam today. We'll obtain lab work as outlined below to rule out other causes.  Dysphagia Patient with chronic issues with this. Suspect related to hiatal hernia though given persistent issues and complaining of possible wheezing from her throat we will refer to GI to start the evaluation and if unremarkable could have her evaluated by ENT for laryngoscopy. Patient with benign lung exam and absence of symptoms at any other time other than when she bends her neck would argue against a pulmonary cause for her wheezing.   Orders Placed This Encounter  Procedures  . Comp Met (CMET)  . CBC  . TSH  . HgB A1c  . Lipid Profile  . Ambulatory referral to Gastroenterology    Referral Priority:   Routine    Referral Type:   Consultation    Referral Reason:   Specialty Services Required    Number of Visits Requested:   1    Meds ordered this encounter  Medications  . triamcinolone cream (KENALOG) 0.1 %  . ketoconazole (NIZORAL) 2 % cream  . mupirocin ointment (BACTROBAN) 2 %  Sig: Place 1 application into the nose 2 (two) times daily.     Tommi Rumps, MD [place]

## 2016-09-04 NOTE — Assessment & Plan Note (Signed)
Patient with chronic issues with this. Suspect related to hiatal hernia though given persistent issues and complaining of possible wheezing from her throat we will refer to GI to start the evaluation and if unremarkable could have her evaluated by ENT for laryngoscopy. Patient with benign lung exam and absence of symptoms at any other time other than when she bends her neck would argue against a pulmonary cause for her wheezing.

## 2016-09-04 NOTE — Assessment & Plan Note (Signed)
Physical exam completed. Encouraged diet and exercise. Colonoscopy and mammograms up-to-date. Breast exam completed. Pelvic exam deferred given age and that she is status post hysterectomy. Vaccinations up-to-date. Lab work as outlined below.

## 2016-09-04 NOTE — Assessment & Plan Note (Signed)
Suspect venous insufficiency. No edema on exam today. We'll obtain lab work as outlined below to rule out other causes.

## 2016-09-09 ENCOUNTER — Other Ambulatory Visit: Payer: Self-pay | Admitting: Family Medicine

## 2016-09-09 DIAGNOSIS — D75839 Thrombocytosis, unspecified: Secondary | ICD-10-CM

## 2016-09-09 DIAGNOSIS — D473 Essential (hemorrhagic) thrombocythemia: Secondary | ICD-10-CM

## 2016-09-11 ENCOUNTER — Telehealth: Payer: Self-pay | Admitting: Family Medicine

## 2016-09-11 NOTE — Telephone Encounter (Signed)
Pt called back returning your call. Thank you!  Call pt @ [phone]

## 2016-09-11 NOTE — Telephone Encounter (Signed)
See result note.  

## 2016-09-12 ENCOUNTER — Encounter: Payer: Self-pay | Admitting: Family Medicine

## 2016-09-12 ENCOUNTER — Ambulatory Visit (INDEPENDENT_AMBULATORY_CARE_PROVIDER_SITE_OTHER): Payer: PPO | Admitting: Family Medicine

## 2016-09-12 VITALS — BP 134/82 | HR 65 | Temp 97.6°F | Wt 163.2 lb

## 2016-09-12 DIAGNOSIS — M25561 Pain in right knee: Secondary | ICD-10-CM

## 2016-09-12 DIAGNOSIS — G8929 Other chronic pain: Secondary | ICD-10-CM | POA: Diagnosis not present

## 2016-09-12 MED ORDER — METHYLPREDNISOLONE ACETATE 40 MG/ML IJ SUSP
40.0000 mg | Freq: Once | INTRAMUSCULAR | Status: AC
  Administered 2016-09-12: 40 mg via INTRAMUSCULAR

## 2016-09-12 NOTE — Patient Instructions (Signed)
Nice to see you. We did a knee injection today. Your pain may be slightly worse over the next several days and then improved. If you develop any redness, swelling, warmth, fevers, or any new changes with your knee please seek medical attention immediately.  We will get you to see physical therapy as well.

## 2016-09-12 NOTE — Progress Notes (Signed)
  Tommi Rumps, MD Phone: [phone]  Lindsay Tran is a 71 y.o. female who presents today for follow-up.  Patient presents for injection in her right knee. She reports continued right knee discomfort. She has no fevers or erythema. No swelling. She notes the pain is unchanged. She had an x-ray that revealed moderate tricompartmental osteoarthritis. She takes Aleve twice daily with little benefit. She has a history of reflux which limits the use of increased doses of NSAIDs. She is also interested in physical therapy.   ROS see history of present illness  Objective  Physical Exam Vitals:   09/12/16 1603  BP: 134/82  Pulse: 65  Temp: 97.6 F (36.4 C)    BP Readings from Last 3 Encounters:  09/12/16 134/82  09/04/16 112/72  03/31/16 132/78   Wt Readings from Last 3 Encounters:  09/12/16 163 lb 3.2 oz (74 kg)  09/04/16 161 lb (73 kg)  03/31/16 160 lb 3.2 oz (72.7 kg)    Physical Exam  Cardiovascular: Normal rate, regular rhythm and normal heart sounds.   Pulmonary/Chest: Effort normal and breath sounds normal.  Musculoskeletal:  Right knee with no swelling, effusion, warmth, or erythema, no joint line tenderness, no ligamentous laxity, negative McMurray's     Assessment/Plan: Please see individual problem list.  Chronic pain of right knee Knee injection performed today. Confirmed patient had no allergies. She has tolerated steroid injections previously in her shoulder. Please see procedure note below. We'll refer to physical therapy as well. She was given return precautions and will monitor for any signs of infection. Written consent will be scanned into the chart.  Knee Injection Procedure Note  Pre-operative Diagnosis: right knee OA  Post-operative Diagnosis: same  Indications: Symptom relief from osteoarthritis  Anesthesia: topical anesthetic spray  Procedure Details   Written consent was obtained for the procedure. The joint was prepped with Betadine.  A 21 gauge needle was inserted into the medial aspect of the joint. 4 ml 1% lidocaine and 1 ml of depomedrol 40 mg/mL was then injected into the joint through the same needle. The needle was removed and the area cleansed and dressed.  Complications:  None; patient tolerated the procedure well.  Tommi Rumps, MD [place]

## 2016-09-12 NOTE — Assessment & Plan Note (Addendum)
Knee injection performed today. Confirmed patient had no allergies. She has tolerated steroid injections previously in her shoulder. Please see procedure note below. We'll refer to physical therapy as well. She was given return precautions and will monitor for any signs of infection. Written consent will be scanned into the chart.

## 2016-09-12 NOTE — Progress Notes (Signed)
Pre visit review using our clinic review tool, if applicable. No additional management support is needed unless otherwise documented below in the visit note. 

## 2016-09-14 ENCOUNTER — Ambulatory Visit: Payer: PPO | Admitting: Family Medicine

## 2016-09-19 ENCOUNTER — Telehealth: Payer: Self-pay | Admitting: Family Medicine

## 2016-09-19 NOTE — Telephone Encounter (Signed)
Pt called and stated that she believes that she has a bladder infection. Pt is c/o burning when urinating and stagnant bladder. Can pt come in and do a urine sample. Please advise, thank you!  Call pt @  [phone]

## 2016-09-19 NOTE — Telephone Encounter (Signed)
Spoke with patient states she feels like she has UTI , bladder spasms,  Itching . Symptoms present since this weekend.  Patient scheduled for appointment for evaluation.

## 2016-09-20 ENCOUNTER — Ambulatory Visit (INDEPENDENT_AMBULATORY_CARE_PROVIDER_SITE_OTHER): Payer: PPO | Admitting: Family Medicine

## 2016-09-20 ENCOUNTER — Encounter: Payer: Self-pay | Admitting: Family Medicine

## 2016-09-20 ENCOUNTER — Telehealth: Payer: Self-pay | Admitting: Radiology

## 2016-09-20 ENCOUNTER — Other Ambulatory Visit (HOSPITAL_COMMUNITY)
Admission: RE | Admit: 2016-09-20 | Discharge: 2016-09-20 | Disposition: A | Discharge status: Home / Self Care | Payer: PPO | Source: Ambulatory Visit | Attending: Family Medicine | Admitting: Family Medicine

## 2016-09-20 VITALS — BP 130/78 | HR 65 | Temp 97.5°F | Wt 161.6 lb

## 2016-09-20 DIAGNOSIS — L298 Other pruritus: Secondary | ICD-10-CM

## 2016-09-20 DIAGNOSIS — L299 Pruritus, unspecified: Secondary | ICD-10-CM | POA: Diagnosis not present

## 2016-09-20 DIAGNOSIS — N898 Other specified noninflammatory disorders of vagina: Secondary | ICD-10-CM

## 2016-09-20 DIAGNOSIS — R300 Dysuria: Secondary | ICD-10-CM | POA: Diagnosis not present

## 2016-09-20 LAB — POCT URINALYSIS DIPSTICK
Bilirubin, UA: NEGATIVE
GLUCOSE UA: NEGATIVE
KETONES UA: NEGATIVE
Nitrite, UA: NEGATIVE
Protein, UA: POSITIVE
RBC UA: NEGATIVE
Spec Grav, UA: 1.03
UROBILINOGEN UA: 0.2
pH, UA: 5.5

## 2016-09-20 MED ORDER — CEPHALEXIN 500 MG PO CAPS
500.0000 mg | ORAL_CAPSULE | Freq: Two times a day (BID) | ORAL | 0 refills | Status: DC
Start: 2016-09-20 — End: 2016-09-29

## 2016-09-20 NOTE — Assessment & Plan Note (Addendum)
Patient with symptoms that could be consistent with UTI or vaginal yeast or BV infection. UA with leukocytes though no nitrites or blood. There is a small amount of vaginal discharge noted on exam. She does have atrophic vaginitis which could place her at risk for UTIs. We will cover with Keflex for UTI. We'll send urine for culture and microscopy. Wet prep was collected. Could additionally consider treatment for atrophic vaginitis with topical estrogen cream once culture and wet prep return. Given return precautions.

## 2016-09-20 NOTE — Telephone Encounter (Signed)
Pt did not have enough urine for both culture and micro, which one would you prefer?

## 2016-09-20 NOTE — Progress Notes (Signed)
  Tommi Rumps, MD Phone: [phone]  Lindsay Tran is a 71 y.o. female who presents today for same-day visit.  Patient reports several days of right-sided suprapubic discomfort when urinating. Notes some spasms with her bladder with urination. Notes some itching near her urethra. Notes no vaginal discharge. Some urinary frequency. Also has urinary urgency. No fevers. Notes she tried an over-the-counter yeast treatment and this did help to some degree though she did have some minimal vaginal bleeding. She is status post hysterectomy. She does still have her ovaries. She is unsure if she has her appendix. Is not sexually active. In the past she has been on topical estrogen for atrophic vaginitis and recurrent UTIs.  PMH: nonsmoker.   ROS see history of present illness  Objective  Physical Exam Vitals:   09/20/16 0940  BP: 130/78  Pulse: 65  Temp: 97.5 F (36.4 C)    BP Readings from Last 3 Encounters:  09/20/16 130/78  09/12/16 134/82  09/04/16 112/72   Wt Readings from Last 3 Encounters:  09/20/16 161 lb 9.6 oz (73.3 kg)  09/12/16 163 lb 3.2 oz (74 kg)  09/04/16 161 lb (73 kg)    Physical Exam  Constitutional: No distress.  Cardiovascular: Normal rate, regular rhythm and normal heart sounds.   Pulmonary/Chest: Effort normal and breath sounds normal.  Abdominal: Soft. Bowel sounds are normal. She exhibits no distension. There is tenderness (mild right-sided suprapubic tendeness). There is no rebound and no guarding.  No right lower quadrant tenderness or McBurney's point tenderness  Genitourinary:  Genitourinary Comments: Normal labia, atrophic vaginal tissue, no bleeding, cervix is absent given prior hysterectomy, minimal white discharge noted, no tenderness on bimanual exam  Neurological: She is alert. Gait normal.  Skin: Skin is warm and dry. She is not diaphoretic.     Assessment/Plan: Please see individual problem list.  Dysuria Patient with symptoms that  could be consistent with UTI or vaginal yeast or BV infection. UA with leukocytes though no nitrites or blood. There is a small amount of vaginal discharge noted on exam. She does have atrophic vaginitis which could place her at risk for UTIs. We will cover with Keflex for UTI. We'll send urine for culture and microscopy. Wet prep was collected. Could additionally consider treatment for atrophic vaginitis with topical estrogen cream once culture and wet prep return. Given return precautions.   Orders Placed This Encounter  Procedures  . WET PREP BY MOLECULAR PROBE  . Urine Culture  . Urine Microscopic Only  . POCT Urinalysis Dipstick    Meds ordered this encounter  Medications  . cephALEXin (KEFLEX) 500 MG capsule    Sig: Take 1 capsule (500 mg total) by mouth 2 (two) times daily.    Dispense:  14 capsule    Refill:  0    Tommi Rumps, MD [place]

## 2016-09-20 NOTE — Progress Notes (Signed)
Pre visit review using our clinic review tool, if applicable. No additional management support is needed unless otherwise documented below in the visit note. 

## 2016-09-20 NOTE — Addendum Note (Signed)
Addended by: Leeanne Rio on: 09/20/2016 10:44 AM   Modules accepted: Orders

## 2016-09-20 NOTE — Telephone Encounter (Signed)
Culture please.

## 2016-09-20 NOTE — Patient Instructions (Signed)
Nice to see you. We are going to treat you for UTI. We will contact you when your vaginal swab returns. If you develop abdominal pain, vaginal bleeding, fevers, or any new or changing symptoms please seek medical attention immediately.

## 2016-09-20 NOTE — Addendum Note (Signed)
Addended by: Leeanne Rio on: 09/20/2016 10:52 AM   Modules accepted: Orders

## 2016-09-21 ENCOUNTER — Ambulatory Visit: Payer: PPO | Admitting: Oncology

## 2016-09-21 ENCOUNTER — Telehealth: Payer: Self-pay | Admitting: Family Medicine

## 2016-09-21 LAB — CERVICOVAGINAL ANCILLARY ONLY: WET PREP (BD AFFIRM): NEGATIVE

## 2016-09-21 NOTE — Telephone Encounter (Signed)
Pt called and wanted to check to see if Dr. Caryl Bis was going to give her an rx for premarin or something similar. Please advise, thank you!  Call pt @ [phone]

## 2016-09-21 NOTE — Telephone Encounter (Signed)
Please advise 

## 2016-09-22 ENCOUNTER — Inpatient Hospital Stay: Payer: PPO | Attending: Oncology | Admitting: Oncology

## 2016-09-22 ENCOUNTER — Inpatient Hospital Stay: Payer: PPO

## 2016-09-22 ENCOUNTER — Encounter: Payer: Self-pay | Admitting: Oncology

## 2016-09-22 VITALS — BP 145/78 | HR 66 | Temp 98.3°F | Resp 18 | Ht 60.0 in | Wt 161.6 lb

## 2016-09-22 DIAGNOSIS — Z7982 Long term (current) use of aspirin: Secondary | ICD-10-CM | POA: Insufficient documentation

## 2016-09-22 DIAGNOSIS — D473 Essential (hemorrhagic) thrombocythemia: Secondary | ICD-10-CM | POA: Insufficient documentation

## 2016-09-22 DIAGNOSIS — Z801 Family history of malignant neoplasm of trachea, bronchus and lung: Secondary | ICD-10-CM | POA: Insufficient documentation

## 2016-09-22 DIAGNOSIS — Z79899 Other long term (current) drug therapy: Secondary | ICD-10-CM | POA: Insufficient documentation

## 2016-09-22 DIAGNOSIS — I2510 Atherosclerotic heart disease of native coronary artery without angina pectoris: Secondary | ICD-10-CM | POA: Diagnosis not present

## 2016-09-22 DIAGNOSIS — D75839 Thrombocytosis, unspecified: Secondary | ICD-10-CM

## 2016-09-22 DIAGNOSIS — E7800 Pure hypercholesterolemia, unspecified: Secondary | ICD-10-CM | POA: Insufficient documentation

## 2016-09-22 DIAGNOSIS — M25561 Pain in right knee: Secondary | ICD-10-CM | POA: Diagnosis not present

## 2016-09-22 DIAGNOSIS — K219 Gastro-esophageal reflux disease without esophagitis: Secondary | ICD-10-CM | POA: Insufficient documentation

## 2016-09-22 DIAGNOSIS — I10 Essential (primary) hypertension: Secondary | ICD-10-CM

## 2016-09-22 DIAGNOSIS — Z800 Family history of malignant neoplasm of digestive organs: Secondary | ICD-10-CM | POA: Insufficient documentation

## 2016-09-22 DIAGNOSIS — G8929 Other chronic pain: Secondary | ICD-10-CM | POA: Insufficient documentation

## 2016-09-22 LAB — CBC
HCT: 36.2 % (ref 35.0–47.0)
HEMOGLOBIN: 12.1 g/dL (ref 12.0–16.0)
MCH: 27.8 pg (ref 26.0–34.0)
MCHC: 33.5 g/dL (ref 32.0–36.0)
MCV: 83.1 fL (ref 80.0–100.0)
Platelets: 446 10*3/uL — ABNORMAL HIGH (ref 150–440)
RBC: 4.35 MIL/uL (ref 3.80–5.20)
RDW: 14.6 % — ABNORMAL HIGH (ref 11.5–14.5)
WBC: 6.8 10*3/uL (ref 3.6–11.0)

## 2016-09-22 LAB — DIFFERENTIAL
Basophils Absolute: 0.1 10*3/uL (ref 0–0.1)
Basophils Relative: 1 %
EOS ABS: 0.3 10*3/uL (ref 0–0.7)
EOS PCT: 4 %
LYMPHS ABS: 1.8 10*3/uL (ref 1.0–3.6)
LYMPHS PCT: 27 %
MONO ABS: 0.6 10*3/uL (ref 0.2–0.9)
Monocytes Relative: 9 %
NEUTROS PCT: 59 %
Neutro Abs: 4 10*3/uL (ref 1.4–6.5)

## 2016-09-22 LAB — C-REACTIVE PROTEIN: CRP: 0.8 mg/dL (ref <1.0)

## 2016-09-22 LAB — FERRITIN: Ferritin: 15 ng/mL (ref 11–307)

## 2016-09-22 LAB — SEDIMENTATION RATE: SED RATE: 12 mm/h (ref 0–30)

## 2016-09-22 LAB — IRON AND TIBC
Iron: 47 ug/dL (ref 28–170)
Saturation Ratios: 9 % — ABNORMAL LOW (ref 10.4–31.8)
TIBC: 514 ug/dL — ABNORMAL HIGH (ref 250–450)
UIBC: 467 ug/dL

## 2016-09-22 LAB — URINE CULTURE: ORGANISM ID, BACTERIA: NO GROWTH

## 2016-09-22 MED ORDER — ESTROGENS, CONJUGATED 0.625 MG/GM VA CREA: 1.0000 | TOPICAL_CREAM | VAGINAL | 2 refills | Status: DC

## 2016-09-22 NOTE — Telephone Encounter (Signed)
Premarin sent to pharmacy.

## 2016-09-22 NOTE — Progress Notes (Signed)
Hematology/Oncology Consult note [facility] Telephone:([phone] [phone]  Patient Care Team: Leone Haven, MD as PCP - General (Family Medicine)   Name of the patient: Lindsay Tran  [id number]  Apr 02, 1946    Reason for referral- thrombocytosis   Referring physician- Dr. Caryl Bis  Date of visit: 09/22/16   History of presenting illness- patient is a 71 year old female who was been referred to [place] for evaluation and management of thrombocytosis. 2 prior values over the last 1 year had shown that she has mild isolated thrombocytosis in the absence of high white count and high hemoglobin. Her prior platelet count in 2016 April was normal at 409. No prior h/o thrombosis or MI or strokes  Component     Latest Ref Rng & Units 07/21/2015 09/04/2016  WBC     4.0 - 10.5 K/uL 9.1 6.4  RBC     3.87 - 5.11 Mil/uL 4.45 4.37  Hemoglobin     12.0 - 15.0 g/dL 12.5 12.1  HCT     36.0 - 46.0 % 38.5 36.5  MCV     78.0 - 100.0 fl 86.4 83.7  MCHC     30.0 - 36.0 g/dL 32.5 33.0  RDW     11.5 - 15.5 % 13.8 13.7  Platelets     150.0 - 400.0 K/uL 472.0 (H) 482.0 (H)  Neutrophils     43.0 - 77.0 % 72.5   Lymphocytes     12.0 - 46.0 % 16.1   Monocytes Relative     3.0 - 12.0 % 7.5   Eosinophil     0.0 - 5.0 % 2.7   Basophil     0.0 - 3.0 % 1.2   NEUT#     1.4 - 7.7 K/uL 6.6   Lymphocyte #     0.7 - 4.0 K/uL 1.5   Monocyte #     0.1 - 1.0 K/uL 0.7   Eosinophils Absolute     0.0 - 0.7 K/uL 0.2   Basophils Absolute     0.0 - 0.1 K/uL 0.1     ECOG PS- 1  Pain scale- 0   Review of systems- Review of Systems  Constitutional: Negative for chills, fever, malaise/fatigue and weight loss.  HENT: Negative for congestion, ear discharge and nosebleeds.   Eyes: Negative for blurred vision.  Respiratory: Negative for cough, hemoptysis, sputum production, shortness of breath and wheezing.   Cardiovascular: Negative for chest pain, palpitations,  orthopnea and claudication.  Gastrointestinal: Negative for abdominal pain, blood in stool, constipation, diarrhea, heartburn, melena, nausea and vomiting.  Genitourinary: Negative for dysuria, flank pain, frequency, hematuria and urgency.  Musculoskeletal: Negative for back pain, joint pain and myalgias.  Skin: Negative for rash.  Neurological: Negative for dizziness, tingling, focal weakness, seizures, weakness and headaches.  Endo/Heme/Allergies: Does not bruise/bleed easily.  Psychiatric/Behavioral: Negative for depression and suicidal ideas. The patient does not have insomnia.     No Known Allergies  Patient Active Problem List   Diagnosis Date Noted  . Dysuria 09/20/2016  . Encounter for general adult medical examination with abnormal findings 09/04/2016  . Pedal edema 09/04/2016  . Dysphagia 09/04/2016  . Chronic pain of right knee 09/04/2016  . UTI (urinary tract infection) 02/17/2016  . BPPV (benign paroxysmal positional vertigo) 12/24/2015  . Abdominal pain 08/03/2015  . Hyperlipidemia 07/27/2015  . Benign essential HTN 07/27/2015  . Rash and nonspecific skin eruption 07/27/2015  . Family history of colon cancer 12/18/2014  .  Chronic diarrhea 12/17/2014  . Gastroesophageal hernia 05/22/2013  . CAD in native artery 09/06/2012  . Acid reflux 09/06/2012  . Adjustment disorder with anxiety 12/27/2006     Past Medical History:  Diagnosis Date  . Anxiety    adjustment disorder with anxiety  . Complication of anesthesia   . Gastric ulcer 1980's  . GERD (gastroesophageal reflux disease)   . High cholesterol   . Hypertension   . Schatzki's ring 05/02/2013  . Scoliosis      Past Surgical History:  Procedure Laterality Date  . ABDOMINAL HYSTERECTOMY    . CARDIAC CATHETERIZATION    . CHOLECYSTECTOMY    . COLONOSCOPY    . COLONOSCOPY N/A 11/12/2014   Procedure: COLONOSCOPY;  Surgeon: Manya Silvas, MD;  Location: [facility] ENDOSCOPY;  Service: Endoscopy;  Laterality:  N/A;  . DIAGNOSTIC LAPAROSCOPY     paraesphogeal hernia  . ESOPHAGOGASTRODUODENOSCOPY    . HERNIA REPAIR     inguinal hernia    Social History   Social History  . Marital status: Single    Spouse name: N/A  . Number of children: N/A  . Years of education: N/A   Occupational History  . Not on file.   Social History Main Topics  . Smoking status: Never Smoker  . Smokeless tobacco: Never Used  . Alcohol use No  . Drug use: No  . Sexual activity: No   Other Topics Concern  . Not on file   Social History Narrative   Divorced   Retired as a Scientist, research (medical)   12 + years of school      Family History  Problem Relation Age of Onset  . Diabetes Mother   . Parkinson's disease Mother   . Cancer Father     Pancreatic  . Cancer Sister     Lung  . Diabetes Sister   . Alzheimer's disease Sister   . Alzheimer's disease Maternal Aunt   . Alzheimer's disease Paternal Aunt   . Diabetes Sister   . Breast cancer Neg Hx      Current Outpatient Prescriptions:  .  Ascorbic Acid (VITAMIN C) 100 MG tablet, Take 100 mg by mouth daily., Disp: , Rfl:  .  aspirin 81 MG tablet, Take 81 mg by mouth daily., Disp: , Rfl:  .  Boswellia-Glucosamine-Vit D (HM GLUCOSAMINE & VITAMIN D3) TABS, Take by mouth. Reported on 08/26/2015, Disp: , Rfl:  .  cephALEXin (KEFLEX) 500 MG capsule, Take 1 capsule (500 mg total) by mouth 2 (two) times daily., Disp: 14 capsule, Rfl: 0 .  cholecalciferol (VITAMIN D) 400 UNITS TABS tablet, Take 400 Units by mouth. Reported on 08/26/2015, Disp: , Rfl:  .  cholestyramine light 4 G POWD, Take by mouth 1 day or 1 dose., Disp: , Rfl:  .  clobetasol (OLUX) 0.05 % topical foam, Apply topically 2 (two) times daily. Please dispense 3 bottles., Disp: 50 g, Rfl: 0 .  clobetasol ointment (TEMOVATE) 6.04 %, Apply 1 application topically 2 (two) times daily., Disp: 180 g, Rfl: 1 .  [START ON 09/25/2016] conjugated estrogens (PREMARIN) vaginal cream, Place 1 Applicatorful vaginally 2  (two) times a week., Disp: 42.5 g, Rfl: 2 .  fenofibrate 160 MG tablet, TAKE ONE TABLET BY MOUTH ONCE DAILY, Disp: 90 tablet, Rfl: 3 .  ketoconazole (NIZORAL) 2 % cream, , Disp: , Rfl:  .  meclizine (ANTIVERT) 25 MG tablet, Take 1 tablet (25 mg total) by mouth 2 (two) times daily as needed for dizziness.,  Disp: 20 tablet, Rfl: 0 .  metoprolol (LOPRESSOR) 100 MG tablet, TAKE ONE TABLET BY MOUTH TWICE DAILY, Disp: 180 tablet, Rfl: 3 .  Misc Natural Products (OSTEO BI-FLEX/5-LOXIN ADVANCED) TABS, Take 1 tablet by mouth 2 (two) times daily., Disp: , Rfl:  .  Multiple Vitamin (MULTIVITAMIN) capsule, Take 1 capsule by mouth daily., Disp: , Rfl:  .  mupirocin ointment (BACTROBAN) 2 %, Place 1 application into the nose 2 (two) times daily., Disp: , Rfl:  .  NAPROXEN PO, Take by mouth 2 (two) times daily., Disp: , Rfl:  .  Omega-3 Fatty Acids (OMEGA 3 500 PO), Take by mouth., Disp: , Rfl:  .  omeprazole (PRILOSEC) 40 MG capsule, TAKE ONE CAPSULE BY MOUTH ONCE DAILY, Disp: 90 capsule, Rfl: 1 .  potassium chloride (K-DUR) 10 MEQ tablet, Take 10 mEq by mouth daily., Disp: , Rfl:  .  Probiotic Product (PROBIOTIC DAILY) CAPS, Take 1 capsule by mouth., Disp: , Rfl:  .  simvastatin (ZOCOR) 40 MG tablet, TAKE ONE TABLET BY MOUTH ONCE DAILY AT  6  PM, Disp: 90 tablet, Rfl: 3 .  triamcinolone cream (KENALOG) 0.1 %, , Disp: , Rfl:  .  vitamin B-12 (CYANOCOBALAMIN) 100 MCG tablet, Take 100 mcg by mouth daily. Reported on 08/26/2015, Disp: , Rfl:    Physical exam:  Vitals:   09/22/16 1543 09/22/16 1554  BP: (!) 145/78   Pulse: 66   Resp: 18   Temp: 98.3 F (36.8 C)   TempSrc: Tympanic   Weight: 161 lb 9.6 oz (73.3 kg)   Height:  5' (1.524 m)   Physical Exam  Constitutional: She is oriented to person, place, and time and well-developed, well-nourished, and in no distress.  HENT:  Head: Normocephalic and atraumatic.  Eyes: EOM are normal. Pupils are equal, round, and reactive to light.  Neck: Normal range  of motion.  Cardiovascular: Normal rate, regular rhythm and normal heart sounds.   Pulmonary/Chest: Effort normal and breath sounds normal.  Abdominal: Soft. Bowel sounds are normal.  Neurological: She is alert and oriented to person, place, and time.  Skin: Skin is warm and dry.       CMP Latest Ref Rng & Units 09/04/2016  Glucose 70 - 99 mg/dL 97  BUN 6 - 23 mg/dL 17  Creatinine 0.40 - 1.20 mg/dL 0.89  Sodium 135 - 145 mEq/L 136  Potassium 3.5 - 5.1 mEq/L 5.0  Chloride 96 - 112 mEq/L 100  CO2 19 - 32 mEq/L 27  Calcium 8.4 - 10.5 mg/dL 9.7  Total Protein 6.0 - 8.3 g/dL 6.7  Total Bilirubin 0.2 - 1.2 mg/dL 0.6  Alkaline Phos 39 - 117 U/L 40  AST 0 - 37 U/L 20  ALT 0 - 35 U/L 10   CBC Latest Ref Rng & Units 09/04/2016  WBC 4.0 - 10.5 K/uL 6.4  Hemoglobin 12.0 - 15.0 g/dL 12.1  Hematocrit 36.0 - 46.0 % 36.5  Platelets 150.0 - 400.0 K/uL 482.0(H)    No images are attached to the encounter.  Dg Knee Complete 4 Views Right  Result Date: 09/04/2016 CLINICAL DATA:  Chronic right knee pain over the past 3-4 months. Occasional catching and locking. EXAM: RIGHT KNEE - COMPLETE 4+ VIEW COMPARISON:  None in PACs FINDINGS: The bones are subjectively mildly osteopenic. There is no acute or healing fracture. There is no lytic or blastic lesion. The joint spaces are reasonably well-maintained. There is beaking of the tibial spines. Small spurs arise from the  articular margins of the patella and femoral condyles. The observed portions of the fibula are normal. There is no joint effusion. IMPRESSION: Moderate tricompartmental osteoarthritic change without significant joint space loss. No acute bony abnormality. Electronically Signed   By: David  Martinique M.D.   On: 09/04/2016 11:57    Assessment and plan- Patient is a 71 y.o. female who has been referred to [place] for evaluation and management of thrombocytosis  Today I will check a repeat CBC with manual differential, pathologically review of her  smear. I will also check BCR abl testing and jak 2 mutation along with ferritin and iron studies ESR and CRP. I will see the patient back in 2-3 weeks' time to discuss the results of her blood work and further management   Thank you for this kind referral and the opportunity to participate in the care of this patient   Visit Diagnosis 1. Thrombocytosis ([place])     Dr. Randa Evens, MD, MPH [facility] at [facility] Pager- [phone] 09/22/2016 4:25 PM

## 2016-09-22 NOTE — Telephone Encounter (Signed)
Left a detailed VM that rx was sent to the pharmacy

## 2016-09-22 NOTE — Progress Notes (Signed)
Patient here today as new evaluation regarding thrombocytosis.  Referred by Dr. Caryl Bis.  Patient states she has been told she has calcium build up in her blood after having a cardiac catherization.

## 2016-09-29 ENCOUNTER — Ambulatory Visit (INDEPENDENT_AMBULATORY_CARE_PROVIDER_SITE_OTHER): Payer: PPO

## 2016-09-29 VITALS — BP 130/70 | HR 61 | Temp 98.0°F | Resp 14 | Ht 60.0 in | Wt 162.0 lb

## 2016-09-29 DIAGNOSIS — Z0000 Encounter for general adult medical examination without abnormal findings: Secondary | ICD-10-CM | POA: Diagnosis not present

## 2016-09-29 NOTE — Progress Notes (Signed)
Subjective:   Lindsay Tran is a 71 y.o. female who presents for Medicare Annual (Subsequent) preventive examination.  Review of Systems:  No ROS.  Medicare Wellness Visit. Cardiac Risk Factors include: advanced age (>46men, >69 women);hypertension;obesity (BMI >30kg/m2)     Objective:     Vitals: BP 130/70 (BP Location: Left Arm, Patient Position: Sitting, Cuff Size: Normal)   Pulse 61   Temp 98 F (36.7 C) (Oral)   Resp 14   Ht 5' (1.524 m)   Wt 162 lb (73.5 kg)   SpO2 95%   BMI 31.64 kg/m   Body mass index is 31.64 kg/m.   Tobacco History  Smoking Status  . Never Smoker  Smokeless Tobacco  . Never Used     Counseling given: Not Answered   Past Medical History:  Diagnosis Date  . Anxiety    adjustment disorder with anxiety  . Arthritis   . Cataract   . Complication of anesthesia   . Gastric ulcer 1980's  . GERD (gastroesophageal reflux disease)   . High cholesterol   . Hypertension   . Osteoporosis   . Schatzki's ring 05/02/2013  . Scoliosis    Past Surgical History:  Procedure Laterality Date  . ABDOMINAL HYSTERECTOMY    . CARDIAC CATHETERIZATION    . CHOLECYSTECTOMY    . COLONOSCOPY    . COLONOSCOPY N/A 11/12/2014   Procedure: COLONOSCOPY;  Surgeon: Manya Silvas, MD;  Location: [facility] ENDOSCOPY;  Service: Endoscopy;  Laterality: N/A;  . DIAGNOSTIC LAPAROSCOPY     paraesphogeal hernia  . ESOPHAGOGASTRODUODENOSCOPY    . HERNIA REPAIR     inguinal hernia   Family History  Problem Relation Age of Onset  . Diabetes Mother   . Parkinson's disease Mother   . Cancer Father     Pancreatic  . Cancer Sister     Lung  . Diabetes Sister   . Alzheimer's disease Maternal Aunt   . Alzheimer's disease Paternal Aunt   . Diabetes Sister   . Alzheimer's disease Sister   . Tics Sister   . Breast cancer Neg Hx    History  Sexual Activity  . Sexual activity: No    Outpatient Encounter Prescriptions as of 09/29/2016  Medication Sig  . Ascorbic  Acid (VITAMIN C) 100 MG tablet Take 100 mg by mouth daily.  Marland Kitchen aspirin 81 MG tablet Take 81 mg by mouth daily.  . Boswellia-Glucosamine-Vit D (HM GLUCOSAMINE & VITAMIN D3) TABS Take by mouth. Reported on 08/26/2015  . cholecalciferol (VITAMIN D) 400 UNITS TABS tablet Take 400 Units by mouth. Reported on 08/26/2015  . cholestyramine light 4 G POWD Take by mouth 1 day or 1 dose.  . clobetasol (OLUX) 0.05 % topical foam Apply topically 2 (two) times daily. Please dispense 3 bottles.  . clobetasol ointment (TEMOVATE) 8.81 % Apply 1 application topically 2 (two) times daily.  Marland Kitchen conjugated estrogens (PREMARIN) vaginal cream Place 1 Applicatorful vaginally 2 (two) times a week.  . fenofibrate 160 MG tablet TAKE ONE TABLET BY MOUTH ONCE DAILY  . ketoconazole (NIZORAL) 2 % cream   . meclizine (ANTIVERT) 25 MG tablet Take 1 tablet (25 mg total) by mouth 2 (two) times daily as needed for dizziness.  . metoprolol (LOPRESSOR) 100 MG tablet TAKE ONE TABLET BY MOUTH TWICE DAILY  . Misc Natural Products (OSTEO BI-FLEX/5-LOXIN ADVANCED) TABS Take 1 tablet by mouth 2 (two) times daily.  . Multiple Vitamin (MULTIVITAMIN) capsule Take 1 capsule by mouth daily.  Marland Kitchen  mupirocin ointment (BACTROBAN) 2 % Place 1 application into the nose 2 (two) times daily.  Marland Kitchen NAPROXEN PO Take by mouth 2 (two) times daily.  . Omega-3 Fatty Acids (OMEGA 3 500 PO) Take by mouth.  Marland Kitchen omeprazole (PRILOSEC) 40 MG capsule TAKE ONE CAPSULE BY MOUTH ONCE DAILY  . potassium chloride (K-DUR) 10 MEQ tablet Take 10 mEq by mouth daily.  . Probiotic Product (PROBIOTIC DAILY) CAPS Take 1 capsule by mouth.  . simvastatin (ZOCOR) 40 MG tablet TAKE ONE TABLET BY MOUTH ONCE DAILY AT  6  PM  . triamcinolone cream (KENALOG) 0.1 %   . vitamin B-12 (CYANOCOBALAMIN) 100 MCG tablet Take 100 mcg by mouth daily. Reported on 08/26/2015  . [DISCONTINUED] cephALEXin (KEFLEX) 500 MG capsule Take 1 capsule (500 mg total) by mouth 2 (two) times daily.   No  facility-administered encounter medications on file as of 09/29/2016.     Activities of Daily Living In your present state of health, do you have any difficulty performing the following activities: 09/29/2016 09/30/2015  Hearing? N N  Vision? N N  Difficulty concentrating or making decisions? N N  Walking or climbing stairs? Y Y  Dressing or bathing? N N  Doing errands, shopping? N N  Preparing Food and eating ? N N  Using the Toilet? N N  In the past six months, have you accidently leaked urine? N N  Do you have problems with loss of bowel control? N Y  Managing your Medications? N N  Managing your Finances? N N  Housekeeping or managing your Housekeeping? N N  Some recent data might be hidden    Patient Care Team: Leone Haven, MD as PCP - General (Family Medicine)    Assessment:    This is a routine wellness examination for Laneta. The goal of the wellness visit is to assist the patient how to close the gaps in care and create a preventative care plan for the patient.   Taking calcium VIT D as appropriate/Osteoporosis reviewed.  Medications reviewed; taking without issues or barriers.  Safety issues reviewed; smoke detectors in the home. No firearms in the home.  Wears seatbelts when driving or riding with others. Patient does wear sunscreen or protective clothing when in direct sunlight. No violence in the home.  Patient is alert, normal appearance, oriented to person/place/and time. Correctly identified the president of the [place], recall of 3/3 words, and performing simple calculations.  Patient displays appropriate judgement and can read correct time from watch face.  No new identified risk were noted.  No failures at ADL's or IADL's.   BMI- discussed the importance of a healthy diet, water intake and exercise. Educational material provided.   Diet: Breakfast: egg/sausage, breakfast bowl Lunch: Hotdog with slaw Dinner: Sandwich Daily fluid intake: 1 cups of  caffeine, 6 cups of water  HTN- followed by PCP.  Dental- every six months. Partial denture, top.  (Dr. Levie Heritage)  Eye- Visual acuity not assessed per patient preference since they have regular follow up with the ophthalmologist.  Wears corrective lenses.  Sleep patterns- Sleeps 8 hours at night.  Wakes feeling rested.  Health maintenance gaps- closed.  Patient Concerns: None at this time. Follow up with PCP as needed.  Exercise Activities and Dietary recommendations Current Exercise Habits: The patient does not participate in regular exercise at present  Goals    . Healthy Lifestyle          Physical therapy 4x weekly. Start exercises at  the [facility], as tolerated. Stay hydrated, drink plenty of water. Low carb diet.  Lean meats, fruits and vegetables.      Fall Risk Fall Risk  09/29/2016 09/30/2015 07/21/2015  Falls in the past year? No No No   Depression Screen PHQ 2/9 Scores 09/29/2016 09/30/2015 07/21/2015  PHQ - 2 Score 0 0 0     Cognitive Function MMSE - Mini Mental State Exam 09/29/2016 09/30/2015  Orientation to time 5 5  Orientation to Place 5 5  Registration 3 3  Attention/ Calculation 5 5  Recall 3 3  Language- name 2 objects 2 2  Language- repeat 1 1  Language- follow 3 step command 3 3  Language- read & follow direction 1 1  Write a sentence 1 1  Copy design 1 1  Total score 30 30        Immunization History  Administered Date(s) Administered  . Influenza, High Dose Seasonal PF 03/31/2016  . Influenza-Unspecified 04/14/2015  . Pneumococcal Conjugate-13 09/30/2015  . Pneumococcal-Unspecified 07/17/2011  . Tdap 07/21/2015  . Zoster 08/17/2011   Screening Tests Health Maintenance  Topic Date Due  . MAMMOGRAM  05/26/2018  . COLONOSCOPY  11/11/2024  . TETANUS/TDAP  07/20/2025  . INFLUENZA VACCINE  Completed  . DEXA SCAN  Completed  . Hepatitis C Screening  Completed  . PNA vac Low Risk Adult  Completed      Plan:    End of life  planning; Advance aging; Advanced directives discussed. Copy of current HCPOA/Living Will requested.    Medicare Attestation I have personally reviewed: The patient's medical and social history Their use of alcohol, tobacco or illicit drugs Their current medications and supplements The patient's functional ability including ADLs,fall risks, home safety risks, cognitive, and hearing and visual impairment Diet and physical activities Evidence for depression   The patient's weight, height, BMI, and visual acuity have been recorded in the chart.  I have made referrals and provided education to the patient based on review of the above and I have provided the patient with a written personalized care plan for preventive services.    During the course of the visit the patient was educated and counseled about the following appropriate screening and preventive services:   Vaccines to include Pneumoccal, Influenza, Hepatitis B, Td, Zostavax, HCV  Colorectal cancer screening-UTD  Bone density screening-UTD  Glaucoma-annual eye exam  Mammography-UTD  Nutrition counseling   Patient Instructions (the written plan) was given to the patient.   Varney Biles, LPN  [id number]

## 2016-09-29 NOTE — Patient Instructions (Addendum)
  Ms. Regner , Thank you for taking time to come for your Medicare Wellness Visit. I appreciate your ongoing commitment to your health goals. Please review the following plan we discussed and let me know if I can assist you in the future.   Follow up with Dr. Caryl Bis as needed.    Bring a copy of your Stamps and/or Living Will to be scanned into chart.  Have a great day!  These are the goals we discussed: Goals    . Healthy Lifestyle          Physical therapy 4x weekly. Start exercises at the [facility], as tolerated. Stay hydrated, drink plenty of water. Low carb diet.  Lean meats, fruits and vegetables.       This is a list of the screening recommended for you and due dates:  Health Maintenance  Topic Date Due  . Mammogram  05/26/2018  . Colon Cancer Screening  11/11/2024  . Tetanus Vaccine  07/20/2025  . Flu Shot  Completed  . DEXA scan (bone density measurement)  Completed  .  Hepatitis C: One time screening is recommended by Center for Disease Control  (CDC) for  adults born from [birth year] through 1967.   Completed  . Pneumonia vaccines  Completed

## 2016-10-03 LAB — JAK2 W/REFLEX TO CALR/MPL

## 2016-10-03 LAB — CALR + MPL (REFLEXED)

## 2016-10-03 LAB — BCR-ABL1, CML/ALL, PCR, QUANT

## 2016-10-03 NOTE — Progress Notes (Signed)
I have reviewed the above note and agree.  Eric Sonnenberg, M.D.  

## 2016-10-06 ENCOUNTER — Ambulatory Visit: Payer: PPO | Attending: Family Medicine | Admitting: Physical Therapy

## 2016-10-06 DIAGNOSIS — R262 Difficulty in walking, not elsewhere classified: Secondary | ICD-10-CM

## 2016-10-06 DIAGNOSIS — M25561 Pain in right knee: Secondary | ICD-10-CM | POA: Insufficient documentation

## 2016-10-06 DIAGNOSIS — M25562 Pain in left knee: Secondary | ICD-10-CM | POA: Insufficient documentation

## 2016-10-06 DIAGNOSIS — G8929 Other chronic pain: Secondary | ICD-10-CM | POA: Diagnosis not present

## 2016-10-06 NOTE — Patient Instructions (Addendum)
5x sit to stand (1st three without hands, 4th and 5th with hands) notable for crepitus -- 19.85 seconds with anterior trunk lean   Ely's position - reproduced her pain bilaterally, ROM lacking 20-30 degrees bilaterally  Hip IR/ER - WNL for ROM  HS 90-90 WNL  Patellar glides initially some pain with R lateral glide.   Hip flexion painful on exam, but appears due to knee flexion   R quad MMT- painful but appropriate strength   R patellar palpation, pain superior and lateral portion of the patella .  Sidelying clamshells with yellow t-band x 12   Steps - not as painful as normal, step to with hand assistance   Manual calf stretching   Mobilizations - to lumbar spine increasingly painful from L3 to L5.   Supine bridging

## 2016-10-06 NOTE — Therapy (Signed)
[facility] [address] [place], [address] Phone: [phone]   Fax:  [phone]  Physical Therapy Evaluation  Patient Details  Name: Lindsay Tran MRN: [id number] Date of Birth: 1946-06-04 No Data Recorded  Encounter Date: 10/06/2016      PT End of Session - 10/06/16 1057    Visit Number 1   Number of Visits 13   Date for PT Re-Evaluation 12/01/16   PT Start Time 1000   PT Stop Time 1055   PT Time Calculation (min) 55 min   Activity Tolerance Patient tolerated treatment well   Behavior During Therapy [facility] for tasks assessed/performed      Past Medical History:  Diagnosis Date  . Anxiety    adjustment disorder with anxiety  . Arthritis   . Cataract   . Complication of anesthesia   . Gastric ulcer 1980's  . GERD (gastroesophageal reflux disease)   . High cholesterol   . Hypertension   . Osteoporosis   . Schatzki's ring 05/02/2013  . Scoliosis     Past Surgical History:  Procedure Laterality Date  . ABDOMINAL HYSTERECTOMY    . CARDIAC CATHETERIZATION    . CHOLECYSTECTOMY    . COLONOSCOPY    . COLONOSCOPY N/A 11/12/2014   Procedure: COLONOSCOPY;  Surgeon: Manya Silvas, MD;  Location: [facility] ENDOSCOPY;  Service: Endoscopy;  Laterality: N/A;  . DIAGNOSTIC LAPAROSCOPY     paraesphogeal hernia  . ESOPHAGOGASTRODUODENOSCOPY    . HERNIA REPAIR     inguinal hernia    There were no vitals filed for this visit.       Subjective Assessment - 10/06/16 1004    Subjective Patient reports that she has had chronic bilateral knee pain, over the past year her R knee has become progressively worse. She recently had a shot in the R knee which helped for about a week. She has had trouble getting on and off commodes or lower surfaces. She is able to walk for roughly 30 minutes before she gets knee or back pain. She went to a gym last year and wasn't sure what equipment to use (mostly walked which made  it much worse).    Limitations Lifting;Standing;Walking;House hold activities   Diagnostic tests X ray showing moderate arthritis (tri-compartmental).    Patient Stated Goals To have less pain.    Currently in Pain? No/denies  Since the shot, just sitting does not hurt now. Previously would have ached.       5x sit to stand (1st three without hands, 4th and 5th with hands) notable for crepitus -- 19.85 seconds with anterior trunk lean   Ely's position - reproduced her pain bilaterally, ROM lacking 20-30 degrees bilaterally  Hip IR/ER - WNL for ROM  HS 90-90 WNL  Patellar glides initially some pain with R lateral glide.   Hip flexion painful on exam, but appears due to knee flexion   R quad MMT- painful but appropriate strength   R patellar palpation, pain superior and lateral portion of the patella .  Sidelying clamshells with yellow t-band x 12   Steps - not as painful as normal, step to with hand assistance   Manual calf stretching x 8 for 10" bouts through available range, (appears symmetrical bilaterally)   Mobilizations - to lumbar spine increasingly painful from L3 to L5.   Supine bridging x 8 through pain free range with toes up as patient initially reports feeling this in her  lower back. In modified range with toes up patient reports feeling "pulling" in her gluteals                          PT Education - 10/06/16 1056    Education provided Yes   Education Details Her knee pain appears related to loss of strength in LE musculature and lack of motion in quadricep group. Should improve quickly with therapy.    Person(s) Educated Patient   Methods Explanation;Demonstration;Handout   Comprehension Verbalized understanding;Returned demonstration             PT Long Term Goals - 10/06/16 1059      PT LONG TERM GOAL #1   Title Patient will be able to perform sit to stand x 5 in under 15 seconds without use of her hands to demonstrate improved  LE strength.    Time 8   Period Weeks   Status New     PT LONG TERM GOAL #2   Title Patient will report LEFS score of greater than 40/80 to demonstrate improved tolerance for ADLs.    Baseline 29/80   Time 8   Period Weeks   Status New     PT LONG TERM GOAL #3   Title Patient will walk for at least 30 minutes with no increase in knee pain to demonstrate improved tolerance for community mobility.    Baseline Pain with prolonged walking.    Time 8   Period Weeks   Status New               Plan - 10/06/16 1103    Clinical Impression Statement Patient is a pleasant 71 y/o female with progressive chronic bilateral knee pain (R>L). Her pain is exacerbated by quadricep stretching, and she demonstrates significant LE weakness (requiring trunk motion and use of UEs to perform sit to stands) all consistent with literature regarding patellofemoral pain syndrome. She reports improved symptoms on this date with quadricep stretching and was provided with HEP to address LE strength deficits and range of motion deficits about her calf/knee. Patient would benefit from skilled PT services to address the listed deficits for return to community mobility.    Rehab Potential Good   PT Frequency 2x / week   PT Duration 6 weeks   PT Treatment/Interventions Aquatic Therapy;ADLs/Self Care Home Management;Gait training;Stair training;Neuromuscular re-education;Therapeutic activities;Therapeutic exercise;Balance training;Electrical Stimulation;Cryotherapy;Moist Heat;Taping;Manual techniques;Dry needling   PT Next Visit Plan Progress from [place] to [facility] hip abductor strengthening, quadricep stretching as tolerated.    PT Home Exercise Plan Quad stretching, calf stretching, supine bridging, sidelying clamshells with yellow t-band.    Consulted and Agree with Plan of Care Patient      Patient will benefit from skilled therapeutic intervention in order to improve the following deficits and impairments:   Abnormal gait, Pain, Decreased balance, Decreased activity tolerance, Decreased endurance, Decreased strength, Decreased range of motion, Decreased mobility, Difficulty walking  Visit Diagnosis: Chronic pain of right knee - Plan: PT plan of care cert/re-cert  Chronic pain of left knee - Plan: PT plan of care cert/re-cert  Difficulty in walking, not elsewhere classified - Plan: PT plan of care cert/re-cert      G-Codes - 78/24/23 1102    Functional Assessment Tool Used (Outpatient Only) LEFS, 5x sit to stand, patient report    Functional Limitation Mobility: Walking and moving around   Mobility: Walking and Moving Around Current Status (N3614) At least 20 percent but  less than 40 percent impaired, limited or restricted   Mobility: Walking and Moving Around Goal Status [phone]) At least 1 percent but less than 20 percent impaired, limited or restricted       Problem List Patient Active Problem List   Diagnosis Date Noted  . Dysuria 09/20/2016  . Encounter for general adult medical examination with abnormal findings 09/04/2016  . Pedal edema 09/04/2016  . Dysphagia 09/04/2016  . Chronic pain of right knee 09/04/2016  . UTI (urinary tract infection) 02/17/2016  . BPPV (benign paroxysmal positional vertigo) 12/24/2015  . Abdominal pain 08/03/2015  . Hyperlipidemia 07/27/2015  . Benign essential HTN 07/27/2015  . Rash and nonspecific skin eruption 07/27/2015  . Family history of colon cancer 12/18/2014  . Chronic diarrhea 12/17/2014  . Gastroesophageal hernia 05/22/2013  . CAD in native artery 09/06/2012  . Acid reflux 09/06/2012  . Adjustment disorder with anxiety 12/27/2006   Royce Macadamia PT, DPT, CSCS    10/06/2016, 11:08 AM  [facility] [address], [place], [address] Phone: [phone]   Fax:  [phone]  Name: Lindsay Tran MRN: [id number] Date of Birth: 28-Apr-1946

## 2016-10-10 ENCOUNTER — Ambulatory Visit: Payer: PPO | Attending: Family Medicine | Admitting: Physical Therapy

## 2016-10-10 DIAGNOSIS — G8929 Other chronic pain: Secondary | ICD-10-CM | POA: Diagnosis not present

## 2016-10-10 DIAGNOSIS — M25562 Pain in left knee: Secondary | ICD-10-CM | POA: Insufficient documentation

## 2016-10-10 DIAGNOSIS — M25561 Pain in right knee: Secondary | ICD-10-CM | POA: Insufficient documentation

## 2016-10-10 DIAGNOSIS — R262 Difficulty in walking, not elsewhere classified: Secondary | ICD-10-CM

## 2016-10-10 NOTE — Therapy (Signed)
[facility] [address] [place], [address] Phone: [phone]   Fax:  [phone]  Physical Therapy Treatment  Patient Details  Name: Lindsay Tran MRN: [id number] Date of Birth: [birth year] No Data Recorded  Encounter Date: 10/10/2016      PT End of Session - 10/10/16 1035    Visit Number 2   Number of Visits 13   Date for PT Re-Evaluation 12/01/16   PT Start Time 1030   PT Stop Time 1109   PT Time Calculation (min) 39 min   Activity Tolerance Patient tolerated treatment well   Behavior During Therapy [facility] for tasks assessed/performed      Past Medical History:  Diagnosis Date  . Anxiety    adjustment disorder with anxiety  . Arthritis   . Cataract   . Complication of anesthesia   . Gastric ulcer 1980's  . GERD (gastroesophageal reflux disease)   . High cholesterol   . Hypertension   . Osteoporosis   . Schatzki's ring 05/02/2013  . Scoliosis     Past Surgical History:  Procedure Laterality Date  . ABDOMINAL HYSTERECTOMY    . CARDIAC CATHETERIZATION    . CHOLECYSTECTOMY    . COLONOSCOPY    . COLONOSCOPY N/A 11/12/2014   Procedure: COLONOSCOPY;  Surgeon: Manya Silvas, MD;  Location: [facility] ENDOSCOPY;  Service: Endoscopy;  Laterality: N/A;  . DIAGNOSTIC LAPAROSCOPY     paraesphogeal hernia  . ESOPHAGOGASTRODUODENOSCOPY    . HERNIA REPAIR     inguinal hernia    There were no vitals filed for this visit.      Subjective Assessment - 10/10/16 1031    Subjective Patient reports her knee pain has been about the same, has been performing her quad stretching x 1 per day. Reports she has had some discomfort in the small of her back.    Limitations Lifting;Standing;Walking;House hold activities   Diagnostic tests X ray showing moderate arthritis (tri-compartmental).    Patient Stated Goals To have less pain.    Currently in Pain? Other (Comment)      Ely's position stretch 3 bouts  totaling 5-8 minutes completed through tolerable range. Mild discomfort reported, though continues to have fairly significant limitation in total ROM.   Standing hip abduction with red t-band (added towel roll) added blue foam pad after first 10 were easy, to 12 repetitions bilaterally.   Leg press 35# x 8 for 2 sets   Standing hip extensions with red t-band on blue foam pad x 12 per set for 2 sets   TRX sit to stands 2 sets x 5 repetitions.(noted to be challenging for her)                            PT Education - 10/10/16 1046    Education provided Yes   Education Details Progressed HEP to standing level activities to increase stress on musculature. Increase stretching at the knee to x3 per day.    Person(s) Educated Patient   Methods Explanation;Demonstration;Handout   Comprehension Verbalized understanding;Returned demonstration             PT Long Term Goals - 10/06/16 1059      PT LONG TERM GOAL #1   Title Patient will be able to perform sit to stand x 5 in under 15 seconds without use of her hands to demonstrate improved LE strength.    Time 8  Period Weeks   Status New     PT LONG TERM GOAL #2   Title Patient will report LEFS score of greater than 40/80 to demonstrate improved tolerance for ADLs.    Baseline 29/80   Time 8   Period Weeks   Status New     PT LONG TERM GOAL #3   Title Patient will walk for at least 30 minutes with no increase in knee pain to demonstrate improved tolerance for community mobility.    Baseline Pain with prolonged walking.    Time 8   Period Weeks   Status New               Plan - 10/10/16 1035    Clinical Impression Statement Paitent reports no change in knee pain symptoms, but may not be performing stretching as often as needed to elicit relief in symptoms. Patient is severely weak in her LEs and even with TRX UE support struggles to perform sit to stands. She would benefit from progressive  strengthening program along with increased amount of time spent stretching quadricep musculature.    Rehab Potential Good   PT Frequency 2x / week   PT Duration 6 weeks   PT Treatment/Interventions Aquatic Therapy;ADLs/Self Care Home Management;Gait training;Stair training;Neuromuscular re-education;Therapeutic activities;Therapeutic exercise;Balance training;Electrical Stimulation;Cryotherapy;Moist Heat;Taping;Manual techniques;Dry needling   PT Next Visit Plan Progress from [place] to [facility] hip abductor strengthening, quadricep stretching as tolerated.    PT Home Exercise Plan Quad stretching, calf stretching, supine bridging, sidelying clamshells with yellow t-band.    Consulted and Agree with Plan of Care Patient      Patient will benefit from skilled therapeutic intervention in order to improve the following deficits and impairments:  Abnormal gait, Pain, Decreased balance, Decreased activity tolerance, Decreased endurance, Decreased strength, Decreased range of motion, Decreased mobility, Difficulty walking  Visit Diagnosis: Chronic pain of right knee  Chronic pain of left knee  Difficulty in walking, not elsewhere classified     Problem List Patient Active Problem List   Diagnosis Date Noted  . Dysuria 09/20/2016  . Encounter for general adult medical examination with abnormal findings 09/04/2016  . Pedal edema 09/04/2016  . Dysphagia 09/04/2016  . Chronic pain of right knee 09/04/2016  . UTI (urinary tract infection) 02/17/2016  . BPPV (benign paroxysmal positional vertigo) 12/24/2015  . Abdominal pain 08/03/2015  . Hyperlipidemia 07/27/2015  . Benign essential HTN 07/27/2015  . Rash and nonspecific skin eruption 07/27/2015  . Family history of colon cancer 12/18/2014  . Chronic diarrhea 12/17/2014  . Gastroesophageal hernia 05/22/2013  . CAD in native artery 09/06/2012  . Acid reflux 09/06/2012  . Adjustment disorder with anxiety 12/27/2006    Royce Macadamia PT,  DPT, CSCS    10/10/2016, 1:02 PM  [facility] [facility] [address] [place], [address] Phone: [phone]   Fax:  [phone]  Name: Lindsay Tran MRN: [id number] Date of Birth: Mar 06, 1946

## 2016-10-10 NOTE — Patient Instructions (Addendum)
Ely's position stretch   Standing hip abduction with red t-band (added towel roll) added blue foam pad after first 10 were easy, to 12   Leg press 35# x 8 for 2 sets   Standing hip extensions with red t-band on blue foam pad x 12 per set for 2 sets   TRX sit to stands 2 sets x 5 repetitions.

## 2016-10-12 ENCOUNTER — Encounter: Payer: PPO | Admitting: Physical Therapy

## 2016-10-13 ENCOUNTER — Inpatient Hospital Stay: Payer: PPO | Admitting: Oncology

## 2016-10-17 ENCOUNTER — Inpatient Hospital Stay: Payer: PPO | Attending: Oncology | Admitting: Oncology

## 2016-10-17 ENCOUNTER — Encounter: Payer: Self-pay | Admitting: Oncology

## 2016-10-17 ENCOUNTER — Ambulatory Visit: Payer: PPO | Admitting: Physical Therapy

## 2016-10-17 VITALS — BP 139/79 | HR 60 | Temp 97.8°F | Resp 18 | Wt 163.5 lb

## 2016-10-17 DIAGNOSIS — F419 Anxiety disorder, unspecified: Secondary | ICD-10-CM | POA: Insufficient documentation

## 2016-10-17 DIAGNOSIS — G8929 Other chronic pain: Secondary | ICD-10-CM

## 2016-10-17 DIAGNOSIS — K219 Gastro-esophageal reflux disease without esophagitis: Secondary | ICD-10-CM | POA: Insufficient documentation

## 2016-10-17 DIAGNOSIS — M419 Scoliosis, unspecified: Secondary | ICD-10-CM | POA: Diagnosis not present

## 2016-10-17 DIAGNOSIS — M25562 Pain in left knee: Secondary | ICD-10-CM

## 2016-10-17 DIAGNOSIS — Z7982 Long term (current) use of aspirin: Secondary | ICD-10-CM | POA: Diagnosis not present

## 2016-10-17 DIAGNOSIS — M25561 Pain in right knee: Secondary | ICD-10-CM | POA: Diagnosis not present

## 2016-10-17 DIAGNOSIS — D75838 Other thrombocytosis: Secondary | ICD-10-CM

## 2016-10-17 DIAGNOSIS — R262 Difficulty in walking, not elsewhere classified: Secondary | ICD-10-CM

## 2016-10-17 DIAGNOSIS — Z79899 Other long term (current) drug therapy: Secondary | ICD-10-CM | POA: Insufficient documentation

## 2016-10-17 DIAGNOSIS — I10 Essential (primary) hypertension: Secondary | ICD-10-CM | POA: Insufficient documentation

## 2016-10-17 DIAGNOSIS — E7800 Pure hypercholesterolemia, unspecified: Secondary | ICD-10-CM | POA: Diagnosis not present

## 2016-10-17 DIAGNOSIS — R7989 Other specified abnormal findings of blood chemistry: Secondary | ICD-10-CM

## 2016-10-17 DIAGNOSIS — D509 Iron deficiency anemia, unspecified: Secondary | ICD-10-CM | POA: Insufficient documentation

## 2016-10-17 DIAGNOSIS — D473 Essential (hemorrhagic) thrombocythemia: Secondary | ICD-10-CM | POA: Diagnosis not present

## 2016-10-17 NOTE — Therapy (Signed)
[facility] [address] [place], [address] Phone: [phone]   Fax:  [phone]  Physical Therapy Treatment  Patient Details  Name: Lindsay Tran MRN: [id number] Date of Birth: [birth year] No Data Recorded  Encounter Date: 10/17/2016      PT End of Session - 10/17/16 1042    Visit Number 3   Number of Visits 13   Date for PT Re-Evaluation 12/01/16   PT Start Time 1034   PT Stop Time 1113   PT Time Calculation (min) 39 min   Activity Tolerance Patient tolerated treatment well   Behavior During Therapy [facility] for tasks assessed/performed      Past Medical History:  Diagnosis Date  . Anxiety    adjustment disorder with anxiety  . Arthritis   . Cataract   . Complication of anesthesia   . Gastric ulcer 1980's  . GERD (gastroesophageal reflux disease)   . High cholesterol   . Hypertension   . Osteoporosis   . Schatzki's ring 05/02/2013  . Scoliosis     Past Surgical History:  Procedure Laterality Date  . ABDOMINAL HYSTERECTOMY    . CARDIAC CATHETERIZATION    . CHOLECYSTECTOMY    . COLONOSCOPY    . COLONOSCOPY N/A 11/12/2014   Procedure: COLONOSCOPY;  Surgeon: Manya Silvas, MD;  Location: [facility] ENDOSCOPY;  Service: Endoscopy;  Laterality: N/A;  . DIAGNOSTIC LAPAROSCOPY     paraesphogeal hernia  . ESOPHAGOGASTRODUODENOSCOPY    . HERNIA REPAIR     inguinal hernia    There were no vitals filed for this visit.      Subjective Assessment - 10/17/16 1035    Subjective Patient reports she still has pain in her knees throughout the day, but it seems to be improving. She reports she has had pain in her knees at night, she reports this has recently started. She was working a lot on her knees last week to put new flooring in.    Limitations Lifting;Standing;Walking;House hold activities   Diagnostic tests X ray showing moderate arthritis (tri-compartmental).    Patient Stated Goals To have less  pain.    Currently in Pain? Yes   Pain Location Knee   Pain Orientation Right;Left;Anterior   Pain Descriptors / Indicators Aching   Pain Type Chronic pain   Pain Onset More than a month ago   Pain Frequency Intermittent      Ely's position quad stretch x 3 minutes per side through relatively pain free ROM. Reported tenderness in distal lateral quadriceps, multiple areas of focal tenderness addressed with ischemic holds and soft tissue mobilization.   Patellar mobilizations (painful medial and lateral on R, just lateral on L) x 2 minutes bilaterally, after completion patient reported less ache/pain bilaterally in her knees   HS Curls 15# x 12  -- 20# x 8 (more challenging)  For 2 sets (added pillow and towel support under thighs for comfort)   Sit to stands (painful) after 6 repetitions with use of hands, likely from excessive quadriceps activity.   Attempted teaching good mornings with 10# DB (felt in knees, too much anterior tibial translation)   Standing hip abductions x 12 with 3" holds on blue foam pad with HHA bilaterally progressed to yellow t-band x 10   Single leg deadlift x 10 per side with HHA on blue foam pad (appropriate technique noted, cuing for increased hip flexion excursion)  PT Education - 10/17/16 1041    Education provided Yes   Education Details Added single leg deadlift to HEP, stretch quadriceps to a point of mild dull ache but not overt pain.    Person(s) Educated Patient   Methods Explanation;Demonstration;Handout   Comprehension Verbalized understanding;Returned demonstration             PT Long Term Goals - 10/06/16 1059      PT LONG TERM GOAL #1   Title Patient will be able to perform sit to stand x 5 in under 15 seconds without use of her hands to demonstrate improved LE strength.    Time 8   Period Weeks   Status New     PT LONG TERM GOAL #2   Title Patient will report LEFS score of greater  than 40/80 to demonstrate improved tolerance for ADLs.    Baseline 29/80   Time 8   Period Weeks   Status New     PT LONG TERM GOAL #3   Title Patient will walk for at least 30 minutes with no increase in knee pain to demonstrate improved tolerance for community mobility.    Baseline Pain with prolonged walking.    Time 8   Period Weeks   Status New               Plan - 10/17/16 1043    Clinical Impression Statement Patient continues to demonstrate movement patterns that increase quadricep demand (poor hip hinging). She is unable to tolerate many quadricep biased activities currently (like sit to stands) due to pain. She did report decreased to no pain after this session with focus on posterior chain strengthening and increasing ROM of patella and quadriceps group. Notable for tender spot in supero-lateral portion of quadricep and lateral distal quadricep. SHe would benefit from progression of ROM and posterior chain strengthening.    Rehab Potential Good   PT Frequency 2x / week   PT Duration 6 weeks   PT Treatment/Interventions Aquatic Therapy;ADLs/Self Care Home Management;Gait training;Stair training;Neuromuscular re-education;Therapeutic activities;Therapeutic exercise;Balance training;Electrical Stimulation;Cryotherapy;Moist Heat;Taping;Manual techniques;Dry needling   PT Next Visit Plan Progress from [place] to [facility] hip abductor strengthening, quadricep stretching as tolerated.    PT Home Exercise Plan Quad stretching, calf stretching, supine bridging, sidelying clamshells with yellow t-band.    Consulted and Agree with Plan of Care Patient      Patient will benefit from skilled therapeutic intervention in order to improve the following deficits and impairments:  Abnormal gait, Pain, Decreased balance, Decreased activity tolerance, Decreased endurance, Decreased strength, Decreased range of motion, Decreased mobility, Difficulty walking  Visit Diagnosis: Chronic pain of  right knee  Chronic pain of left knee  Difficulty in walking, not elsewhere classified     Problem List Patient Active Problem List   Diagnosis Date Noted  . Dysuria 09/20/2016  . Encounter for general adult medical examination with abnormal findings 09/04/2016  . Pedal edema 09/04/2016  . Dysphagia 09/04/2016  . Chronic pain of right knee 09/04/2016  . UTI (urinary tract infection) 02/17/2016  . BPPV (benign paroxysmal positional vertigo) 12/24/2015  . Abdominal pain 08/03/2015  . Hyperlipidemia 07/27/2015  . Benign essential HTN 07/27/2015  . Rash and nonspecific skin eruption 07/27/2015  . Family history of colon cancer 12/18/2014  . Chronic diarrhea 12/17/2014  . Gastroesophageal hernia 05/22/2013  . CAD in native artery 09/06/2012  . Acid reflux 09/06/2012  . Adjustment disorder with anxiety 12/27/2006   Royce Macadamia PT, DPT, CSCS  10/17/2016, 11:41 AM  [place] [facility] [address], [place], [address] Phone: [phone]   Fax:  [phone]  Name: Lindsay Tran MRN: [id number] Date of Birth: 1946/05/30

## 2016-10-17 NOTE — Patient Instructions (Addendum)
Ely's position quad stretch x 3 minutes per side   Patellar mobilizations (painful medial and lateral on R, just lateral on L)  HS Curls 15# x 12  -- 20# x 8 (more challenging)  For 2 sets (added pillow and towel support under thighs for comfort)   Sit to stands (painful)   Attempted teaching good mornings with 10# DB (felt in knees, too much anterior tibial translation)   Standing hip abductions x 12 with 3" holds on blue foam pad with HHA bilaterally progressed to yellow t-band x 10   Single leg deadlift x 10 per side with HHA on blue foam pad

## 2016-10-17 NOTE — Progress Notes (Signed)
Hematology/Oncology Consult note [facility]  Telephone:([phone] [phone]  Patient Care Team: Leone Haven, MD as PCP - General (Family Medicine)   Name of the patient: Lindsay Tran  [id number]  1945/10/07   Date of visit: 10/17/16  Diagnosis- thrombocytosis likely secondary to iron deficiency  Chief complaint/ Reason for visit- discuss results of bloodwork  Heme/Onc history: patient is a 71 year old female who was been referred to [place] for evaluation and management of thrombocytosis. 2 prior values over the last 1 year had shown that she has mild isolated thrombocytosis in the absence of high white count and high hemoglobin. Her prior platelet count in 2016 April was normal at 409. No prior h/o thrombosis or MI or strokes  Results of blood work from the 16th 2018 were as follows: CBC showed white count of 6.8, H&H of 12.1/36.2 with a platelet count of 446. Testing for BCR able was negative. Jak 2 CALR and MPL testing was negative. Ferritin was low at 15. Iron studies showed a low iron saturation of 9% an elevated TIBC of 514.  Interval history- overall doing well. Denies any complaints    Review of systems- Review of Systems  Constitutional: Negative for chills, fever, malaise/fatigue and weight loss.  HENT: Negative for congestion, ear discharge and nosebleeds.   Eyes: Negative for blurred vision.  Respiratory: Negative for cough, hemoptysis, sputum production, shortness of breath and wheezing.   Cardiovascular: Negative for chest pain, palpitations, orthopnea and claudication.  Gastrointestinal: Negative for abdominal pain, blood in stool, constipation, diarrhea, heartburn, melena, nausea and vomiting.  Genitourinary: Negative for dysuria, flank pain, frequency, hematuria and urgency.  Musculoskeletal: Negative for back pain, joint pain and myalgias.  Skin: Negative for rash.  Neurological: Negative for dizziness, tingling, focal  weakness, seizures, weakness and headaches.  Endo/Heme/Allergies: Does not bruise/bleed easily.  Psychiatric/Behavioral: Negative for depression and suicidal ideas. The patient does not have insomnia.      Current treatment- observation  No Known Allergies   Past Medical History:  Diagnosis Date  . Anxiety    adjustment disorder with anxiety  . Arthritis   . Cataract   . Complication of anesthesia   . Gastric ulcer 1980's  . GERD (gastroesophageal reflux disease)   . High cholesterol   . Hypertension   . Osteoporosis   . Schatzki's ring 05/02/2013  . Scoliosis      Past Surgical History:  Procedure Laterality Date  . ABDOMINAL HYSTERECTOMY    . CARDIAC CATHETERIZATION    . CHOLECYSTECTOMY    . COLONOSCOPY    . COLONOSCOPY N/A 11/12/2014   Procedure: COLONOSCOPY;  Surgeon: Manya Silvas, MD;  Location: [facility] ENDOSCOPY;  Service: Endoscopy;  Laterality: N/A;  . DIAGNOSTIC LAPAROSCOPY     paraesphogeal hernia  . ESOPHAGOGASTRODUODENOSCOPY    . HERNIA REPAIR     inguinal hernia    Social History   Social History  . Marital status: Single    Spouse name: N/A  . Number of children: N/A  . Years of education: N/A   Occupational History  . Not on file.   Social History Main Topics  . Smoking status: Never Smoker  . Smokeless tobacco: Never Used  . Alcohol use No  . Drug use: No  . Sexual activity: No   Other Topics Concern  . Not on file   Social History Narrative   Divorced   Retired as a Scientist, research (medical)   12 + years of school  Family History  Problem Relation Age of Onset  . Diabetes Mother   . Parkinson's disease Mother   . Cancer Father     Pancreatic  . Cancer Sister     Lung  . Diabetes Sister   . Alzheimer's disease Maternal Aunt   . Alzheimer's disease Paternal Aunt   . Diabetes Sister   . Alzheimer's disease Sister   . Tics Sister   . Breast cancer Neg Hx      Current Outpatient Prescriptions:  .  aspirin 81 MG tablet, Take 81  mg by mouth daily., Disp: , Rfl:  .  Boswellia-Glucosamine-Vit D (HM GLUCOSAMINE & VITAMIN D3) TABS, Take by mouth. Reported on 08/26/2015, Disp: , Rfl:  .  cholestyramine light 4 G POWD, Take by mouth 1 day or 1 dose., Disp: , Rfl:  .  clobetasol (OLUX) 0.05 % topical foam, Apply topically 2 (two) times daily. Please dispense 3 bottles., Disp: 50 g, Rfl: 0 .  clobetasol ointment (TEMOVATE) 7.90 %, Apply 1 application topically 2 (two) times daily., Disp: 180 g, Rfl: 1 .  conjugated estrogens (PREMARIN) vaginal cream, Place 1 Applicatorful vaginally 2 (two) times a week., Disp: 42.5 g, Rfl: 2 .  fenofibrate 160 MG tablet, TAKE ONE TABLET BY MOUTH ONCE DAILY, Disp: 90 tablet, Rfl: 3 .  ketoconazole (NIZORAL) 2 % cream, , Disp: , Rfl:  .  meclizine (ANTIVERT) 25 MG tablet, Take 1 tablet (25 mg total) by mouth 2 (two) times daily as needed for dizziness., Disp: 20 tablet, Rfl: 0 .  metoprolol (LOPRESSOR) 100 MG tablet, TAKE ONE TABLET BY MOUTH TWICE DAILY, Disp: 180 tablet, Rfl: 3 .  Misc Natural Products (OSTEO BI-FLEX/5-LOXIN ADVANCED) TABS, Take 1 tablet by mouth 2 (two) times daily., Disp: , Rfl:  .  mupirocin ointment (BACTROBAN) 2 %, Place 1 application into the nose 2 (two) times daily., Disp: , Rfl:  .  NAPROXEN PO, Take by mouth 2 (two) times daily., Disp: , Rfl:  .  Omega-3 Fatty Acids (OMEGA 3 500 PO), Take by mouth., Disp: , Rfl:  .  omeprazole (PRILOSEC) 40 MG capsule, TAKE ONE CAPSULE BY MOUTH ONCE DAILY, Disp: 90 capsule, Rfl: 1 .  potassium chloride (K-DUR) 10 MEQ tablet, Take 10 mEq by mouth daily., Disp: , Rfl:  .  Probiotic Product (PROBIOTIC DAILY) CAPS, Take 1 capsule by mouth., Disp: , Rfl:  .  simvastatin (ZOCOR) 40 MG tablet, TAKE ONE TABLET BY MOUTH ONCE DAILY AT  6  PM, Disp: 90 tablet, Rfl: 3 .  triamcinolone cream (KENALOG) 0.1 %, , Disp: , Rfl:  .  Multiple Vitamin (MULTIVITAMIN) capsule, Take 1 capsule by mouth daily., Disp: , Rfl:   Physical exam:  Vitals:   10/17/16  1207  BP: 139/79  Pulse: 60  Resp: 18  Temp: 97.8 F (36.6 C)  TempSrc: Tympanic  Weight: 163 lb 8 oz (74.2 kg)   Physical Exam  Constitutional: She is oriented to person, place, and time and well-developed, well-nourished, and in no distress.  HENT:  Head: Normocephalic and atraumatic.  Eyes: EOM are normal. Pupils are equal, round, and reactive to light.  Neck: Normal range of motion.  Cardiovascular: Normal rate, regular rhythm and normal heart sounds.   Pulmonary/Chest: Effort normal and breath sounds normal.  Abdominal: Soft. Bowel sounds are normal.  Neurological: She is alert and oriented to person, place, and time.  Skin: Skin is warm and dry.     CMP Latest Ref Rng &  Units 09/04/2016  Glucose 70 - 99 mg/dL 97  BUN 6 - 23 mg/dL 17  Creatinine 0.40 - 1.20 mg/dL 0.89  Sodium 135 - 145 mEq/L 136  Potassium 3.5 - 5.1 mEq/L 5.0  Chloride 96 - 112 mEq/L 100  CO2 19 - 32 mEq/L 27  Calcium 8.4 - 10.5 mg/dL 9.7  Total Protein 6.0 - 8.3 g/dL 6.7  Total Bilirubin 0.2 - 1.2 mg/dL 0.6  Alkaline Phos 39 - 117 U/L 40  AST 0 - 37 U/L 20  ALT 0 - 35 U/L 10   CBC Latest Ref Rng & Units 09/22/2016  WBC 3.6 - 11.0 K/uL 6.8  Hemoglobin 12.0 - 16.0 g/dL 12.1  Hematocrit 35.0 - 47.0 % 36.2  Platelets 150 - 440 K/uL 446(H)    No images are attached to the encounter.  No results found.   Assessment and plan- Patient is a 71 y.o. female referred to [place] for thrombocytosis likely secondary to iron deficiency  Patient has had a chronically elevated platelet count ranging in the high 400s but has remained more or less the same range and has not been trending up. Testing for BCR able as well as jak 2 was negative as above. This argues against a primary hematologic disorder. She does not need a bone marrow biopsy at this time. Iron studies are consistent with iron deficiency although she is not overtly anemic. This may be responsible for mild thrombocytosis. I have discussed with patient  start taking oral iron once a day and we will see if her thrombocytosis. I will see her back in 4 months time with repeat CBC along with ferritin and iron studies. It is possible that despite improvement of her iron studies her platelet counts may not in. Regardless patient does not need any further invasive investigation at this time and a platelet counts can be monitored conservatively. Patient is in agreement with the plan   Visit Diagnosis 1. Iron deficiency anemia, unspecified iron deficiency anemia type   2. Secondary thrombocytosis      Dr. Randa Evens, MD, MPH [facility] at [facility] Pager- [phone] 10/17/2016 1:47 PM

## 2016-10-19 ENCOUNTER — Ambulatory Visit: Payer: PPO | Admitting: Physical Therapy

## 2016-10-19 DIAGNOSIS — M25561 Pain in right knee: Principal | ICD-10-CM

## 2016-10-19 DIAGNOSIS — M25562 Pain in left knee: Secondary | ICD-10-CM

## 2016-10-19 DIAGNOSIS — G8929 Other chronic pain: Secondary | ICD-10-CM

## 2016-10-19 DIAGNOSIS — R262 Difficulty in walking, not elsewhere classified: Secondary | ICD-10-CM

## 2016-10-19 NOTE — Therapy (Signed)
[place] [facility] [address] [place], [address] Phone: [phone]   Fax:  [phone]  Physical Therapy Treatment  Patient Details  Name: Lindsay Tran MRN: [id number] Date of Birth: 07-Aug-1945 No Data Recorded  Encounter Date: 10/19/2016      PT End of Session - 10/19/16 1100    Visit Number 4   Number of Visits 13   Date for PT Re-Evaluation 12/01/16   PT Start Time 5956   PT Stop Time 1104   PT Time Calculation (min) 23 min   Activity Tolerance Patient tolerated treatment well   Behavior During Therapy [facility] for tasks assessed/performed      Past Medical History:  Diagnosis Date  . Anxiety    adjustment disorder with anxiety  . Arthritis   . Cataract   . Complication of anesthesia   . Gastric ulcer 1980's  . GERD (gastroesophageal reflux disease)   . High cholesterol   . Hypertension   . Osteoporosis   . Schatzki's ring 05/02/2013  . Scoliosis     Past Surgical History:  Procedure Laterality Date  . ABDOMINAL HYSTERECTOMY    . CARDIAC CATHETERIZATION    . CHOLECYSTECTOMY    . COLONOSCOPY    . COLONOSCOPY N/A 11/12/2014   Procedure: COLONOSCOPY;  Surgeon: Manya Silvas, MD;  Location: [facility] ENDOSCOPY;  Service: Endoscopy;  Laterality: N/A;  . DIAGNOSTIC LAPAROSCOPY     paraesphogeal hernia  . ESOPHAGOGASTRODUODENOSCOPY    . HERNIA REPAIR     inguinal hernia    There were no vitals filed for this visit.      Subjective Assessment - 10/19/16 1040    Subjective Patient reports she gets throbbing in her R knee primarily if she sits for too long or while she is laying in bed at night. She is not having the knee pain while walking that she was having prior to starting therapy and getting the injection.    Limitations Lifting;Standing;Walking;House hold activities   Diagnostic tests X ray showing moderate arthritis (tri-compartmental).    Patient Stated Goals To have less pain.    Currently in Pain? Other (Comment)  Reports more soreness in her quadricep on R side than joint pain currently   Pain Onset --      Ely's position stretching x 10 repetitions x 3-5" holds for 3 sets -- continues to have more restriction in ROM on RLE > LLE, RLE was more uncomfortable for her though able to tolerate.   Supine bridging pushing through heels and squeezing glutes -- initially felt in lumbar spine, however with cuing for squeezing glutes she was able to perform 2 sets x 8 repetitions appropriately with no increase   Attempted side planks -- and modified side planks with her knees on the table, both were too challenging (or too painful) on her shoulder, thus opted to perform horizontal chops instead.   Horizontal chops with palloff starting position with green t-band -- cuing for technique set up (extending her arms, rotating through her hips rather than lumbar spine) 2 sets x 8 repetitions per side for 3" holds -- appropriate muscular activity and challenge.                             PT Education - 10/19/16 1113    Education provided Yes   Education Details Cuing for bridging to squeeze gluteals, start to actively flex/extend her knee  when sedentary to reduce her pain while sedentary.    Person(s) Educated Patient   Methods Explanation;Demonstration;Handout   Comprehension Returned demonstration;Verbalized understanding             PT Long Term Goals - 10/06/16 1059      PT LONG TERM GOAL #1   Title Patient will be able to perform sit to stand x 5 in under 15 seconds without use of her hands to demonstrate improved LE strength.    Time 8   Period Weeks   Status New     PT LONG TERM GOAL #2   Title Patient will report LEFS score of greater than 40/80 to demonstrate improved tolerance for ADLs.    Baseline 29/80   Time 8   Period Weeks   Status New     PT LONG TERM GOAL #3   Title Patient will walk for at least 30 minutes with no increase  in knee pain to demonstrate improved tolerance for community mobility.    Baseline Pain with prolonged walking.    Time 8   Period Weeks   Status New               Plan - 10/19/16 1114    Clinical Impression Statement Patient is reporting decreased pain with ambulation, though continues to have pain when sedentary/inactive. She is globally quite weak and unable to stand without use of UEs or perform a modified side plank. Her ROM into Ely's position is improving with each session as is her tolerance for passive stretching. She would continue to benefit from work on increasing her tissue flexibility and LE strength.    Rehab Potential Good   PT Frequency 2x / week   PT Duration 6 weeks   PT Treatment/Interventions Aquatic Therapy;ADLs/Self Care Home Management;Gait training;Stair training;Neuromuscular re-education;Therapeutic activities;Therapeutic exercise;Balance training;Electrical Stimulation;Cryotherapy;Moist Heat;Taping;Manual techniques;Dry needling   PT Next Visit Plan Progress from [place] to [facility] hip abductor strengthening, quadricep stretching as tolerated.    PT Home Exercise Plan Quad stretching, calf stretching, supine bridging, sidelying clamshells with yellow t-band.    Consulted and Agree with Plan of Care Patient      Patient will benefit from skilled therapeutic intervention in order to improve the following deficits and impairments:  Abnormal gait, Pain, Decreased balance, Decreased activity tolerance, Decreased endurance, Decreased strength, Decreased range of motion, Decreased mobility, Difficulty walking  Visit Diagnosis: Chronic pain of right knee  Chronic pain of left knee  Difficulty in walking, not elsewhere classified     Problem List Patient Active Problem List   Diagnosis Date Noted  . Dysuria 09/20/2016  . Encounter for general adult medical examination with abnormal findings 09/04/2016  . Pedal edema 09/04/2016  . Dysphagia 09/04/2016  .  Chronic pain of right knee 09/04/2016  . UTI (urinary tract infection) 02/17/2016  . BPPV (benign paroxysmal positional vertigo) 12/24/2015  . Abdominal pain 08/03/2015  . Hyperlipidemia 07/27/2015  . Benign essential HTN 07/27/2015  . Rash and nonspecific skin eruption 07/27/2015  . Family history of colon cancer 12/18/2014  . Chronic diarrhea 12/17/2014  . Gastroesophageal hernia 05/22/2013  . CAD in native artery 09/06/2012  . Acid reflux 09/06/2012  . Adjustment disorder with anxiety 12/27/2006   Royce Macadamia PT, DPT, CSCS    10/19/2016, 11:17 AM  [place] [facility] [address] [place], [address] Phone: [phone]   Fax:  [phone]  Name: Lindsay Tran MRN: [id number] Date  of Birth: 22-Oct-1945

## 2016-10-19 NOTE — Patient Instructions (Signed)
Ely's position stretching   Supine bridging pushing through heels and squeezing glutes   Attempted side planks   Horizontal chops with palloff starting position with green t-band

## 2016-10-24 ENCOUNTER — Ambulatory Visit: Payer: PPO | Admitting: Physical Therapy

## 2016-10-24 DIAGNOSIS — M25562 Pain in left knee: Secondary | ICD-10-CM

## 2016-10-24 DIAGNOSIS — M25561 Pain in right knee: Principal | ICD-10-CM

## 2016-10-24 DIAGNOSIS — R262 Difficulty in walking, not elsewhere classified: Secondary | ICD-10-CM

## 2016-10-24 DIAGNOSIS — G8929 Other chronic pain: Secondary | ICD-10-CM

## 2016-10-24 NOTE — Patient Instructions (Addendum)
Standing hip abductions at 25# x 12 repetitions with 3" hold sfor 2 sets bilaterally   Leg Press 45# x 8 repetitions x 2 sets   5 sit to stands with red t-band (no use of UEs, but did bring her chest forward) x 2 sets -- on 2nd set used hands on thighs, but very minimally   Side stepping with red t- band x 10 (easy) progressed to red and green t-band x 12 for 2 sets (more challenging)   Prone Ely's position stretching

## 2016-10-24 NOTE — Therapy (Signed)
[place] [facility] [address] [place], [address] Phone: [phone]   Fax:  [phone]  Physical Therapy Treatment  Patient Details  Name: Lindsay Tran MRN: [id number] Date of Birth: 1945-07-15 No Data Recorded  Encounter Date: 10/24/2016      PT End of Session - 10/24/16 1700    Visit Number 5   Number of Visits 13   Date for PT Re-Evaluation 12/01/16   PT Start Time 9892   PT Stop Time 1736   PT Time Calculation (min) 38 min   Activity Tolerance Patient tolerated treatment well   Behavior During Therapy [facility] for tasks assessed/performed      Past Medical History:  Diagnosis Date  . Anxiety    adjustment disorder with anxiety  . Arthritis   . Cataract   . Complication of anesthesia   . Gastric ulcer 1980's  . GERD (gastroesophageal reflux disease)   . High cholesterol   . Hypertension   . Osteoporosis   . Schatzki's ring 05/02/2013  . Scoliosis     Past Surgical History:  Procedure Laterality Date  . ABDOMINAL HYSTERECTOMY    . CARDIAC CATHETERIZATION    . CHOLECYSTECTOMY    . COLONOSCOPY    . COLONOSCOPY N/A 11/12/2014   Procedure: COLONOSCOPY;  Surgeon: Manya Silvas, MD;  Location: [facility] ENDOSCOPY;  Service: Endoscopy;  Laterality: N/A;  . DIAGNOSTIC LAPAROSCOPY     paraesphogeal hernia  . ESOPHAGOGASTRODUODENOSCOPY    . HERNIA REPAIR     inguinal hernia    There were no vitals filed for this visit.      Subjective Assessment - 10/24/16 1658    Subjective Patient reports her knees have been feeling better, She has been performing her HEP with no complaints.    Limitations Lifting;Standing;Walking;House hold activities   Diagnostic tests X ray showing moderate arthritis (tri-compartmental).    Patient Stated Goals To have less pain.    Currently in Pain? No/denies      Standing hip abductions at 25# x 12 repetitions with 3" hold sfor 2 sets bilaterally   Leg Press 45# x 8  repetitions x 2 sets   5 sit to stands with red t-band (no use of UEs, but did bring her chest forward) x 2 sets -- on 2nd set used hands on thighs, but very minimally   Side stepping with red t- band x 10 (easy) progressed to red and green t-band x 12 for 2 sets (more challenging)   Prone Ely's position stretching -- (patient was reporting some pain in her R patellar tendon area) x 15 repetitions x 3" holds bilaterally for 2 sets (reports improved symptoms after completion). Notable for significant increase in ROM during stretching.                             PT Education - 10/24/16 1700    Education provided Yes   Education Details Provided gym based routine for her to attempt more independent HEP, progressing to discharge.    Person(s) Educated Patient   Methods Explanation;Demonstration;Handout   Comprehension Verbalized understanding;Returned demonstration             PT Long Term Goals - 10/06/16 1059      PT LONG TERM GOAL #1   Title Patient will be able to perform sit to stand x 5 in under 15 seconds without use of her hands to  demonstrate improved LE strength.    Time 8   Period Weeks   Status New     PT LONG TERM GOAL #2   Title Patient will report LEFS score of greater than 40/80 to demonstrate improved tolerance for ADLs.    Baseline 29/80   Time 8   Period Weeks   Status New     PT LONG TERM GOAL #3   Title Patient will walk for at least 30 minutes with no increase in knee pain to demonstrate improved tolerance for community mobility.    Baseline Pain with prolonged walking.    Time 8   Period Weeks   Status New               Plan - 10/24/16 1700    Clinical Impression Statement Patient is able to complete sit to stand without use of UEs with red band on thighs. She has demonstrated improved LE strength and ROM with quad stretching in this session, reports significant improvement in her knee pain bilaterally. Patient has made  good progress with strength and mobilkit, provided with gym based routine to begin on Thursday, will progress towards discharge if well tolerated.    Rehab Potential Good   PT Frequency 2x / week   PT Duration 6 weeks   PT Treatment/Interventions Aquatic Therapy;ADLs/Self Care Home Management;Gait training;Stair training;Neuromuscular re-education;Therapeutic activities;Therapeutic exercise;Balance training;Electrical Stimulation;Cryotherapy;Moist Heat;Taping;Manual techniques;Dry needling   PT Next Visit Plan Progress from [place] to [facility] hip abductor strengthening, quadricep stretching as tolerated.    PT Home Exercise Plan Quad stretching, calf stretching, supine bridging, sidelying clamshells with yellow t-band.    Consulted and Agree with Plan of Care Patient      Patient will benefit from skilled therapeutic intervention in order to improve the following deficits and impairments:  Abnormal gait, Pain, Decreased balance, Decreased activity tolerance, Decreased endurance, Decreased strength, Decreased range of motion, Decreased mobility, Difficulty walking  Visit Diagnosis: Chronic pain of right knee  Chronic pain of left knee  Difficulty in walking, not elsewhere classified     Problem List Patient Active Problem List   Diagnosis Date Noted  . Dysuria 09/20/2016  . Encounter for general adult medical examination with abnormal findings 09/04/2016  . Pedal edema 09/04/2016  . Dysphagia 09/04/2016  . Chronic pain of right knee 09/04/2016  . UTI (urinary tract infection) 02/17/2016  . BPPV (benign paroxysmal positional vertigo) 12/24/2015  . Abdominal pain 08/03/2015  . Hyperlipidemia 07/27/2015  . Benign essential HTN 07/27/2015  . Rash and nonspecific skin eruption 07/27/2015  . Family history of colon cancer 12/18/2014  . Chronic diarrhea 12/17/2014  . Gastroesophageal hernia 05/22/2013  . CAD in native artery 09/06/2012  . Acid reflux 09/06/2012  . Adjustment disorder  with anxiety 12/27/2006   Royce Macadamia PT, DPT, CSCS    10/24/2016, 5:47 PM  [place] [facility] [address], [place], [address] Phone: [phone]   Fax:  [phone]  Name: Lindsay Tran MRN: [id number] Date of Birth: 10-06-1945

## 2016-10-26 ENCOUNTER — Ambulatory Visit: Payer: PPO | Admitting: Physical Therapy

## 2016-10-26 DIAGNOSIS — M25561 Pain in right knee: Secondary | ICD-10-CM | POA: Diagnosis not present

## 2016-10-26 DIAGNOSIS — R262 Difficulty in walking, not elsewhere classified: Secondary | ICD-10-CM

## 2016-10-26 DIAGNOSIS — M25562 Pain in left knee: Secondary | ICD-10-CM

## 2016-10-26 DIAGNOSIS — G8929 Other chronic pain: Secondary | ICD-10-CM

## 2016-10-26 NOTE — Therapy (Signed)
[facility] [address] [place], [address] Phone: [phone]   Fax:  [phone]  Physical Therapy Treatment  Patient Details  Name: Lindsay Tran MRN: [id number] Date of Birth: 1946/04/20 No Data Recorded  Encounter Date: 10/26/2016      PT End of Session - 10/26/16 1649    Visit Number 6   Number of Visits 13   Date for PT Re-Evaluation 12/01/16   PT Start Time 5456   PT Stop Time 2563   PT Time Calculation (min) 29 min   Activity Tolerance Patient tolerated treatment well   Behavior During Therapy [facility] for tasks assessed/performed      Past Medical History:  Diagnosis Date  . Anxiety    adjustment disorder with anxiety  . Arthritis   . Cataract   . Complication of anesthesia   . Gastric ulcer 1980's  . GERD (gastroesophageal reflux disease)   . High cholesterol   . Hypertension   . Osteoporosis   . Schatzki's ring 05/02/2013  . Scoliosis     Past Surgical History:  Procedure Laterality Date  . ABDOMINAL HYSTERECTOMY    . CARDIAC CATHETERIZATION    . CHOLECYSTECTOMY    . COLONOSCOPY    . COLONOSCOPY N/A 11/12/2014   Procedure: COLONOSCOPY;  Surgeon: Manya Silvas, MD;  Location: [facility] ENDOSCOPY;  Service: Endoscopy;  Laterality: N/A;  . DIAGNOSTIC LAPAROSCOPY     paraesphogeal hernia  . ESOPHAGOGASTRODUODENOSCOPY    . HERNIA REPAIR     inguinal hernia    There were no vitals filed for this visit.      Subjective Assessment - 10/26/16 1649    Subjective Patient reports she was able to get to the gym today and did not have any increased pain, but did not find several of the machines that she had gone over with PT.    Limitations Lifting;Standing;Walking;House hold activities   Diagnostic tests X ray showing moderate arthritis (tri-compartmental).    Patient Stated Goals To have less pain.    Currently in Pain? No/denies       Single leg deadlits x 10 for 2 sets with HHA  on TM railing (cuing to increase hip extension on contralateral LE)    LEFS - 37/80  5x sit to stand without use of UEs - 15 seconds (did report pain after)   Educated and modified technique with TRX sit to stands x 5 for 2 sets (increased speed, though continued to have significant trunk flexion to complete) -- cuing for foot placement and having slight external rotation at her feet/hips.   Standing calf stretch x 5 per side for 15" holds -- educated patient to complete as tolerated to reduce knee pain prior to HEP                         PT Education - 10/26/16 1649    Education provided Yes   Education Details PT will go to the gym with patient to modify exercise plan as needed.    Person(s) Educated Patient   Methods Explanation;Demonstration;Handout   Comprehension Verbalized understanding;Returned demonstration             PT Long Term Goals - 10/26/16 1650      PT LONG TERM GOAL #1   Title Patient will be able to perform sit to stand x 5 in under 15 seconds without use of her hands to demonstrate improved  LE strength.    Baseline 15 seconds without use of hands (did increase her pain)    Time 8   Period Weeks   Status Achieved     PT LONG TERM GOAL #2   Title Patient will report LEFS score of greater than 40/80 to demonstrate improved tolerance for ADLs.    Baseline 29/80 -- 37/80 on 4/19   Time 8   Period Weeks   Status Partially Met     PT LONG TERM GOAL #3   Title Patient will walk for at least 30 minutes with no increase in knee pain to demonstrate improved tolerance for community mobility.    Baseline Pain with prolonged walking.    Time 8   Period Weeks   Status Achieved               Plan - 10/26/16 1649    Clinical Impression Statement Patient reports significant improvement in symptoms, she continues to have some knee pain, but much more tolerable now. She is able to perform her ADLs without pain and stand from a chair  without her hands now. She would like to transition to a gym based program.    Rehab Potential Good   PT Frequency 2x / week   PT Duration 6 weeks   PT Treatment/Interventions Aquatic Therapy;ADLs/Self Care Home Management;Gait training;Stair training;Neuromuscular re-education;Therapeutic activities;Therapeutic exercise;Balance training;Electrical Stimulation;Cryotherapy;Moist Heat;Taping;Manual techniques;Dry needling   PT Next Visit Plan Progress from [facility] to [facility] hip abductor strengthening, quadricep stretching as tolerated.    PT Home Exercise Plan Quad stretching, calf stretching, supine bridging, sidelying clamshells with yellow t-band.    Consulted and Agree with Plan of Care Patient      Patient will benefit from skilled therapeutic intervention in order to improve the following deficits and impairments:  Abnormal gait, Pain, Decreased balance, Decreased activity tolerance, Decreased endurance, Decreased strength, Decreased range of motion, Decreased mobility, Difficulty walking  Visit Diagnosis: Chronic pain of right knee  Chronic pain of left knee  Difficulty in walking, not elsewhere classified     Problem List Patient Active Problem List   Diagnosis Date Noted  . Dysuria 09/20/2016  . Encounter for general adult medical examination with abnormal findings 09/04/2016  . Pedal edema 09/04/2016  . Dysphagia 09/04/2016  . Chronic pain of right knee 09/04/2016  . UTI (urinary tract infection) 02/17/2016  . BPPV (benign paroxysmal positional vertigo) 12/24/2015  . Abdominal pain 08/03/2015  . Hyperlipidemia 07/27/2015  . Benign essential HTN 07/27/2015  . Rash and nonspecific skin eruption 07/27/2015  . Family history of colon cancer 12/18/2014  . Chronic diarrhea 12/17/2014  . Gastroesophageal hernia 05/22/2013  . CAD in native artery 09/06/2012  . Acid reflux 09/06/2012  . Adjustment disorder with anxiety 12/27/2006   Royce Macadamia PT, DPT, CSCS     10/26/2016, 5:39 PM  [facility] [address], [place], [address] Phone: [phone]   Fax:  [phone]  Name: ALVIN RUBANO MRN: [id number] Date of Birth: Feb 29, 1946

## 2016-10-26 NOTE — Patient Instructions (Signed)
LEFS - 37/80  5x sit to stand without use of UEs - 15 seconds (did report pain after)   Educated and modified technique with TRX sit to stands   Standing calf stretch

## 2016-11-01 ENCOUNTER — Ambulatory Visit: Payer: PPO | Admitting: Physical Therapy

## 2016-11-03 ENCOUNTER — Other Ambulatory Visit: Payer: Self-pay | Admitting: Family Medicine

## 2016-11-03 MED ORDER — ESTROGENS, CONJUGATED 0.625 MG/GM VA CREA: 1.0000 | TOPICAL_CREAM | VAGINAL | 2 refills | Status: DC

## 2016-11-03 NOTE — Telephone Encounter (Signed)
Last OV 09/20/16 last filled 09/25/16 patient would like 90 day supply

## 2016-11-03 NOTE — Telephone Encounter (Signed)
Pt is requesting a refill on conjugated estrogens (PREMARIN) vaginal cream. She would like to have this written as a 90 day.   Thanks

## 2016-12-05 ENCOUNTER — Encounter: Payer: Self-pay | Admitting: Family Medicine

## 2016-12-05 ENCOUNTER — Ambulatory Visit (INDEPENDENT_AMBULATORY_CARE_PROVIDER_SITE_OTHER): Payer: PPO | Admitting: Family Medicine

## 2016-12-05 DIAGNOSIS — I10 Essential (primary) hypertension: Secondary | ICD-10-CM

## 2016-12-05 DIAGNOSIS — G8929 Other chronic pain: Secondary | ICD-10-CM | POA: Diagnosis not present

## 2016-12-05 DIAGNOSIS — M25561 Pain in right knee: Secondary | ICD-10-CM

## 2016-12-05 DIAGNOSIS — M19072 Primary osteoarthritis, left ankle and foot: Secondary | ICD-10-CM

## 2016-12-05 DIAGNOSIS — N952 Postmenopausal atrophic vaginitis: Secondary | ICD-10-CM | POA: Insufficient documentation

## 2016-12-05 DIAGNOSIS — R1310 Dysphagia, unspecified: Secondary | ICD-10-CM

## 2016-12-05 MED ORDER — PANTOPRAZOLE SODIUM 40 MG PO TBEC: 40.0000 mg | DELAYED_RELEASE_TABLET | Freq: Every day | ORAL | 3 refills | Status: DC

## 2016-12-05 MED ORDER — ESTROGENS, CONJUGATED 0.625 MG/GM VA CREA: 1.0000 | TOPICAL_CREAM | VAGINAL | 2 refills | Status: DC

## 2016-12-05 NOTE — Assessment & Plan Note (Signed)
Has responded well to Premarin cream. Refill given.

## 2016-12-05 NOTE — Assessment & Plan Note (Addendum)
Suspect swelling and discomfort is related to arthritis in the left ankle. Advise compression, elevation, and ice. If not improving could consider x-ray.

## 2016-12-05 NOTE — Assessment & Plan Note (Signed)
Stable. She declined seeing GI. Does have some persistent reflux symptoms. We'll change her to Protonix. Consider GI referral in the future if she is willing.

## 2016-12-05 NOTE — Patient Instructions (Signed)
Nice to see you. I suspect you have arthritis in your left ankle. Please keep it elevated. You can buy a compression brace from the pharmacy. You can additionally ice it. If this does not improve over the next several weeks please let us know We will switch to Protonix for your reflux. I have refilled your estrogen cream.

## 2016-12-05 NOTE — Assessment & Plan Note (Signed)
At goal. Continue current medication. 

## 2016-12-05 NOTE — Progress Notes (Signed)
  Tommi Rumps, MD Phone: [phone]  Lindsay Tran is a 71 y.o. female who presents today for follow-up.  Hypertension: Not checking at home. Takes metoprolol. No chest pain or shortness of breath.  Dysphagia/GERD: Still has some sensation that food sticks at times if she takes too big of a bite of food. Does get some reflux symptoms late at night occasionally. Takes Prilosec. No blood in her stool. She declines to see GI. She has a history of hiatal hernia and has had her esophagus stretched in the past. Does take NSAIDs for her knee. Notes when she takes this with food she has no issues.  Right knee has improved with physical therapy and Aleve. If she stays still for too long it will be sore. She has been doing exercises at home.  Atrophic vaginitis: This has improved significantly with vaginal estrogen cream. Itching has gone away. Her irritation and bleeding has resolved as well. She does not have a uterus.  She reports left ankle swelling and pain intermittently over the last year. Has never been swollen though when she's come to see me. She brings a picture and it does reveal fairly swollen left ankle. No swelling proximally to the ankle. No injury. Elevating helped some. She's been using a topical muscle cream as well. Also taking Aleve.  PMH: nonsmoker.   ROS see history of present illness  Objective  Physical Exam Vitals:   12/05/16 0959  BP: 132/70  Pulse: 63  Temp: 97.9 F (36.6 C)    BP Readings from Last 3 Encounters:  12/05/16 132/70  10/17/16 139/79  09/29/16 130/70   Wt Readings from Last 3 Encounters:  12/05/16 162 lb 3.2 oz (73.6 kg)  10/17/16 163 lb 8 oz (74.2 kg)  09/29/16 162 lb (73.5 kg)    Physical Exam  Constitutional: No distress.  Cardiovascular: Normal rate, regular rhythm and normal heart sounds.   Pulmonary/Chest: Effort normal and breath sounds normal.  Musculoskeletal:  Bilateral knees no warmth, swelling, erythema, or tenderness,  no ligamentous laxity, negative McMurray's, left ankle with mild swelling, no malleoli tenderness, no navicular tenderness, no fifth metatarsal tenderness, no warmth or erythema, 2+ DP pulse, right ankle with no swelling, tenderness, warmth, or erythema, 2+ DP pulses  Skin: She is not diaphoretic.     Assessment/Plan: Please see individual problem list.  Benign essential HTN At goal. Continue current medication.  Dysphagia Stable. She declined seeing GI. Does have some persistent reflux symptoms. We'll change her to Protonix. Consider GI referral in the future if she is willing.  Chronic pain of right knee Significantly improved. Continue exercises at home. Encouraged to minimize Aleve.  Arthritis of left ankle Suspect swelling and discomfort is related to arthritis in the left ankle. Advise compression, elevation, and ice. If not improving could consider x-ray.  Atrophic vaginitis Has responded well to Premarin cream. Refill given.   No orders of the defined types were placed in this encounter.   Meds ordered this encounter  Medications  . conjugated estrogens (PREMARIN) vaginal cream    Sig: Place 1 Applicatorful vaginally 2 (two) times a week. Dispense a 90 day supply.    Dispense:  60 g    Refill:  2  . pantoprazole (PROTONIX) 40 MG tablet    Sig: Take 1 tablet (40 mg total) by mouth daily.    Dispense:  30 tablet    Refill:  [place], MD [place]

## 2016-12-05 NOTE — Assessment & Plan Note (Signed)
Significantly improved. Continue exercises at home. Encouraged to minimize Aleve.

## 2017-01-02 ENCOUNTER — Encounter: Payer: Self-pay | Admitting: Family Medicine

## 2017-01-02 ENCOUNTER — Ambulatory Visit (INDEPENDENT_AMBULATORY_CARE_PROVIDER_SITE_OTHER): Payer: PPO | Admitting: Family Medicine

## 2017-01-02 VITALS — BP 140/82 | HR 60 | Temp 97.8°F | Wt 160.0 lb

## 2017-01-02 DIAGNOSIS — G8929 Other chronic pain: Secondary | ICD-10-CM

## 2017-01-02 DIAGNOSIS — K529 Noninfective gastroenteritis and colitis, unspecified: Secondary | ICD-10-CM

## 2017-01-02 DIAGNOSIS — M25561 Pain in right knee: Secondary | ICD-10-CM | POA: Diagnosis not present

## 2017-01-02 DIAGNOSIS — M1711 Unilateral primary osteoarthritis, right knee: Secondary | ICD-10-CM

## 2017-01-02 DIAGNOSIS — K219 Gastro-esophageal reflux disease without esophagitis: Secondary | ICD-10-CM | POA: Diagnosis not present

## 2017-01-02 MED ORDER — PANTOPRAZOLE SODIUM 40 MG PO TBEC: 40.0000 mg | DELAYED_RELEASE_TABLET | Freq: Every day | ORAL | 3 refills | Status: DC

## 2017-01-02 MED ORDER — MUPIROCIN 2 % EX OINT: 1.0000 "application " | TOPICAL_OINTMENT | Freq: Two times a day (BID) | CUTANEOUS | 1 refills | Status: DC

## 2017-01-02 MED ORDER — PANTOPRAZOLE SODIUM 40 MG PO TBEC: 40.0000 mg | DELAYED_RELEASE_TABLET | Freq: Every day | ORAL | 1 refills | Status: DC

## 2017-01-02 NOTE — Progress Notes (Signed)
Tommi Rumps, MD Phone: [phone]  Lindsay Tran is a 71 y.o. female who presents today for follow-up.  Right knee pain: Patient has chronic osteoarthritic changes in her right knee. Notes she thinks she twisted it about a week and a half ago. Had trouble putting pressure on it initially. Has improved to some degree. Does catch and pop on her. No locking. It is somewhat stiff. Aleve does help. She is using ice packs and compression.  GERD: Protonix does work for her. She's not had any symptoms since switching to this. No blood in her stool.  She did note some nausea starting last night followed by diarrhea. No abdominal pain. No vomiting. No blood in her stool. She notes she does occasionally get this since she had her gallbladder taken out.  Patient does note occasionally getting numbness down her right lateral leg depending on how long she has been sitting down for. She gets up and moves this goes away. No numbness elsewhere. No weakness. No bowel or bladder changes.  PMH: nonsmoker.   ROS see history of present illness  Objective  Physical Exam Vitals:   01/02/17 0850  BP: 140/82  Pulse: 60  Temp: 97.8 F (36.6 C)    BP Readings from Last 3 Encounters:  01/02/17 140/82  12/05/16 132/70  10/17/16 139/79   Wt Readings from Last 3 Encounters:  01/02/17 160 lb (72.6 kg)  12/05/16 162 lb 3.2 oz (73.6 kg)  10/17/16 163 lb 8 oz (74.2 kg)    Physical Exam  Constitutional: No distress.  Cardiovascular: Normal rate, regular rhythm and normal heart sounds.   Pulmonary/Chest: Effort normal and breath sounds normal.  Musculoskeletal:  Bilateral knees with no swelling, warmth, erythema, or joint line tenderness, no other tenderness, no ligament laxity, negative McMurray's  Neurological: She is alert. Gait normal.  5/5 strength bilateral quads, hamstrings, plantar flexion, and dorsiflexion, sensation to light touch intact in bilateral extremities  Skin: Skin is warm and  dry. She is not diaphoretic.     Assessment/Plan: Please see individual problem list.  Acid reflux Plan to refill Protonix. We'll refer to GI for consideration of EGD.  Chronic pain of right knee Suspect related osteoarthritis. Benign exam today. Discussed potential risk of additional serial steroid injections in her knee. We opted for referral to orthopedics. She can continue Aleve unless it starts to bother her stomach. I suspect the numbness that she describes is positionally related as it only occurs if she sits in the same position for long period of time and resolves with change in position. Discussed monitoring this. If worsens or changes or she develops new symptoms she'll be reevaluated.  Chronic diarrhea Chronic intermittent issue. Potentially related to her gallbladder. Only a day or 2 of symptoms now. Benign abdominal exam. She'll monitor and if worsens she will be evaluated.   Orders Placed This Encounter  Procedures  . Ambulatory referral to Orthopedic Surgery    Referral Priority:   Routine    Referral Type:   Surgical    Referral Reason:   Specialty Services Required    Requested Specialty:   Orthopedic Surgery    Number of Visits Requested:   1  . Ambulatory referral to Gastroenterology    Referral Priority:   Routine    Referral Type:   Consultation    Referral Reason:   Specialty Services Required    Number of Visits Requested:   1    Meds ordered this encounter  Medications  .  DISCONTD: pantoprazole (PROTONIX) 40 MG tablet    Sig: Take 1 tablet (40 mg total) by mouth daily.    Dispense:  30 tablet    Refill:  3  . mupirocin ointment (BACTROBAN) 2 %    Sig: Place 1 application into the nose 2 (two) times daily.    Dispense:  22 g    Refill:  1  . pantoprazole (PROTONIX) 40 MG tablet    Sig: Take 1 tablet (40 mg total) by mouth daily.    Dispense:  90 tablet    Refill:  1   Tommi Rumps, MD [place]

## 2017-01-02 NOTE — Assessment & Plan Note (Signed)
Chronic intermittent issue. Potentially related to her gallbladder. Only a day or 2 of symptoms now. Benign abdominal exam. She'll monitor and if worsens she will be evaluated.

## 2017-01-02 NOTE — Assessment & Plan Note (Signed)
Plan to refill Protonix. We'll refer to GI for consideration of EGD.

## 2017-01-02 NOTE — Assessment & Plan Note (Addendum)
Suspect related osteoarthritis. Benign exam today. Discussed potential risk of additional serial steroid injections in her knee. We opted for referral to orthopedics. She can continue Aleve unless it starts to bother her stomach. I suspect the numbness that she describes is positionally related as it only occurs if she sits in the same position for long period of time and resolves with change in position. Discussed monitoring this. If worsens or changes or she develops new symptoms she'll be reevaluated.

## 2017-01-02 NOTE — Patient Instructions (Signed)
Nice to see you. We'll get you to see orthopedics and GI. Please monitor the diarrhea and nausea. If this worsens or you develop abdominal pain or blood in your stool please seek medical attention immediately.

## 2017-01-11 DIAGNOSIS — M170 Bilateral primary osteoarthritis of knee: Secondary | ICD-10-CM | POA: Diagnosis not present

## 2017-01-11 DIAGNOSIS — M179 Osteoarthritis of knee, unspecified: Secondary | ICD-10-CM | POA: Insufficient documentation

## 2017-01-11 DIAGNOSIS — M1710 Unilateral primary osteoarthritis, unspecified knee: Secondary | ICD-10-CM | POA: Insufficient documentation

## 2017-01-18 DIAGNOSIS — M170 Bilateral primary osteoarthritis of knee: Secondary | ICD-10-CM | POA: Diagnosis not present

## 2017-01-18 DIAGNOSIS — M1711 Unilateral primary osteoarthritis, right knee: Secondary | ICD-10-CM | POA: Diagnosis not present

## 2017-01-25 DIAGNOSIS — M1711 Unilateral primary osteoarthritis, right knee: Secondary | ICD-10-CM | POA: Diagnosis not present

## 2017-02-01 DIAGNOSIS — M1711 Unilateral primary osteoarthritis, right knee: Secondary | ICD-10-CM | POA: Diagnosis not present

## 2017-02-05 DIAGNOSIS — R197 Diarrhea, unspecified: Secondary | ICD-10-CM | POA: Diagnosis not present

## 2017-02-05 DIAGNOSIS — R1310 Dysphagia, unspecified: Secondary | ICD-10-CM | POA: Diagnosis not present

## 2017-02-05 DIAGNOSIS — Z8371 Family history of colonic polyps: Secondary | ICD-10-CM | POA: Diagnosis not present

## 2017-02-05 DIAGNOSIS — K219 Gastro-esophageal reflux disease without esophagitis: Secondary | ICD-10-CM | POA: Diagnosis not present

## 2017-02-05 DIAGNOSIS — K449 Diaphragmatic hernia without obstruction or gangrene: Secondary | ICD-10-CM | POA: Diagnosis not present

## 2017-02-12 NOTE — Progress Notes (Signed)
Hematology/Oncology Consult note [facility]  [phone] [phone]  Patient Care Team: Leone Haven, MD as PCP - General (Family Medicine)   Name of the patient: Lindsay Tran  [id number]  1946/06/06   Date of visit: 02/12/17   Diagnosis- thrombocytosis likely secondary to iron deficiency  Chief complaint/ Reason for visit- routine f/u  Heme/Onc history: patient is a 71 year old female who was been referred to [place] for evaluation and management of thrombocytosis. 2 prior values over the last 1 year had shown that she has mild isolated thrombocytosis in the absence of high white count and high hemoglobin. Her prior platelet count in 2016 April was normal at 409. No prior h/o thrombosis or MI or strokes  Results of blood work from the 16th 2018 were as follows: CBC showed white count of 6.8, H&H of 12.1/36.2 with a platelet count of 446. Testing for BCR able was negative. Jak 2 CALR and MPL testing was negative. Ferritin was low at 15. Iron studies showed a low iron saturation of 9% an elevated TIBC of 514.esr was normal  Patient was asked to take po iron for her iron deficiency to see if her thrombocytosis improves   Interval history- she is tolerating po iron well. Reports no complaints  ECOG PS- 0 Pain scale- 0   Review of systems- Review of Systems  Constitutional: Negative for chills, fever, malaise/fatigue and weight loss.  HENT: Negative for congestion, ear discharge and nosebleeds.   Eyes: Negative for blurred vision.  Respiratory: Negative for cough, hemoptysis, sputum production, shortness of breath and wheezing.   Cardiovascular: Negative for chest pain, palpitations, orthopnea and claudication.  Gastrointestinal: Negative for abdominal pain, blood in stool, constipation, diarrhea, heartburn, melena, nausea and vomiting.  Genitourinary: Negative for dysuria, flank pain, frequency, hematuria and urgency.    Musculoskeletal: Negative for back pain, joint pain and myalgias.  Skin: Negative for rash.  Neurological: Negative for dizziness, tingling, focal weakness, seizures, weakness and headaches.  Endo/Heme/Allergies: Does not bruise/bleed easily.  Psychiatric/Behavioral: Negative for depression and suicidal ideas. The patient does not have insomnia.       No Known Allergies   Past Medical History:  Diagnosis Date  . Anxiety    adjustment disorder with anxiety  . Arthritis   . Cataract   . Complication of anesthesia   . Gastric ulcer 1980's  . GERD (gastroesophageal reflux disease)   . High cholesterol   . Hypertension   . Osteoporosis   . Schatzki's ring 05/02/2013  . Scoliosis      Past Surgical History:  Procedure Laterality Date  . ABDOMINAL HYSTERECTOMY    . CARDIAC CATHETERIZATION    . CHOLECYSTECTOMY    . COLONOSCOPY    . COLONOSCOPY N/A 11/12/2014   Procedure: COLONOSCOPY;  Surgeon: Manya Silvas, MD;  Location: [facility] ENDOSCOPY;  Service: Endoscopy;  Laterality: N/A;  . DIAGNOSTIC LAPAROSCOPY     paraesphogeal hernia  . ESOPHAGOGASTRODUODENOSCOPY    . HERNIA REPAIR     inguinal hernia    Social History   Social History  . Marital status: Single    Spouse name: N/A  . Number of children: N/A  . Years of education: N/A   Occupational History  . Not on file.   Social History Main Topics  . Smoking status: Never Smoker  . Smokeless tobacco: Never Used  . Alcohol use No  . Drug use: No  . Sexual activity: No   Other Topics Concern  .  Not on file   Social History Narrative   Divorced   Retired as a Occupational hygienist   12 + years of school     Family History  Problem Relation Age of Onset  . Diabetes Mother   . Parkinson's disease Mother   . Cancer Father        Pancreatic  . Cancer Sister        Lung  . Diabetes Sister   . Alzheimer's disease Maternal Aunt   . Alzheimer's disease Paternal Aunt   . Diabetes Sister   . Alzheimer's disease  Sister   . Tics Sister   . Breast cancer Neg Hx      Current Outpatient Prescriptions:  .  aspirin 81 MG tablet, Take 81 mg by mouth daily., Disp: , Rfl:  .  Boswellia-Glucosamine-Vit D (HM GLUCOSAMINE & VITAMIN D3) TABS, Take by mouth. Reported on 08/26/2015, Disp: , Rfl:  .  cholestyramine light 4 G POWD, Take by mouth 1 day or 1 dose., Disp: , Rfl:  .  clobetasol (OLUX) 0.05 % topical foam, Apply topically 2 (two) times daily. Please dispense 3 bottles., Disp: 50 g, Rfl: 0 .  clobetasol ointment (TEMOVATE) 0.05 %, Apply 1 application topically 2 (two) times daily., Disp: 180 g, Rfl: 1 .  conjugated estrogens (PREMARIN) vaginal cream, Place 1 Applicatorful vaginally 2 (two) times a week. Dispense a 90 day supply., Disp: 60 g, Rfl: 2 .  fenofibrate 160 MG tablet, TAKE ONE TABLET BY MOUTH ONCE DAILY, Disp: 90 tablet, Rfl: 3 .  ketoconazole (NIZORAL) 2 % cream, , Disp: , Rfl:  .  meclizine (ANTIVERT) 25 MG tablet, Take 1 tablet (25 mg total) by mouth 2 (two) times daily as needed for dizziness., Disp: 20 tablet, Rfl: 0 .  metoprolol (LOPRESSOR) 100 MG tablet, TAKE ONE TABLET BY MOUTH TWICE DAILY, Disp: 180 tablet, Rfl: 3 .  Misc Natural Products (OSTEO BI-FLEX/5-LOXIN ADVANCED) TABS, Take 1 tablet by mouth 2 (two) times daily., Disp: , Rfl:  .  Multiple Vitamin (MULTIVITAMIN) capsule, Take 1 capsule by mouth daily., Disp: , Rfl:  .  mupirocin ointment (BACTROBAN) 2 %, Place 1 application into the nose 2 (two) times daily., Disp: 22 g, Rfl: 1 .  NAPROXEN PO, Take by mouth 2 (two) times daily., Disp: , Rfl:  .  Omega-3 Fatty Acids (OMEGA 3 500 PO), Take by mouth., Disp: , Rfl:  .  pantoprazole (PROTONIX) 40 MG tablet, Take 1 tablet (40 mg total) by mouth daily., Disp: 90 tablet, Rfl: 1 .  potassium chloride (K-DUR) 10 MEQ tablet, Take 10 mEq by mouth daily., Disp: , Rfl:  .  Probiotic Product (PROBIOTIC DAILY) CAPS, Take 1 capsule by mouth., Disp: , Rfl:  .  simvastatin (ZOCOR) 40 MG tablet,  TAKE ONE TABLET BY MOUTH ONCE DAILY AT  6  PM, Disp: 90 tablet, Rfl: 3 .  triamcinolone cream (KENALOG) 0.1 %, , Disp: , Rfl:   Physical exam:  Vitals:   02/13/17 1021  BP: 124/74  Pulse: (!) 57  Resp: 18  Temp: (!) 97.5 F (36.4 C)  TempSrc: Tympanic  Weight: 154 lb 14.4 oz (70.3 kg)  Height: 5' (1.524 m)   Physical Exam  Constitutional: She is oriented to person, place, and time and well-developed, well-nourished, and in no distress.  HENT:  Head: Normocephalic and atraumatic.  Eyes: Pupils are equal, round, and reactive to light. EOM are normal.  Neck: Normal range of motion.  Cardiovascular: Normal rate,  regular rhythm and normal heart sounds.   Pulmonary/Chest: Effort normal and breath sounds normal.  Abdominal: Soft. Bowel sounds are normal.  Neurological: She is alert and oriented to person, place, and time.  Skin: Skin is warm and dry.     CMP Latest Ref Rng & Units 09/04/2016  Glucose 70 - 99 mg/dL 97  BUN 6 - 23 mg/dL 17  Creatinine 0.40 - 1.20 mg/dL 0.89  Sodium 135 - 145 mEq/L 136  Potassium 3.5 - 5.1 mEq/L 5.0  Chloride 96 - 112 mEq/L 100  CO2 19 - 32 mEq/L 27  Calcium 8.4 - 10.5 mg/dL 9.7  Total Protein 6.0 - 8.3 g/dL 6.7  Total Bilirubin 0.2 - 1.2 mg/dL 0.6  Alkaline Phos 39 - 117 U/L 40  AST 0 - 37 U/L 20  ALT 0 - 35 U/L 10   CBC Latest Ref Rng & Units 02/13/2017  WBC 3.6 - 11.0 K/uL 5.4  Hemoglobin 12.0 - 16.0 g/dL 12.4  Hematocrit 35.0 - 47.0 % 36.2  Platelets 150 - 440 K/uL 463(H)     Assessment and plan- Patient is a 71 y.o. female with thrombocytosis likely secondary to iron deficiency  Platelet count remains elevated at 463. Iron studies pending. If iron levels are still low despite taking po iron, I will give her 2 doses of feraheme 510 mg IV weekly to see if platelet counts improve after correcting iron deficiency. If iron levels are normal, then iron deficiency not contributing to thrombocytosis. Her platelet counts have remained stable high  in 400's. She is asymptomatic without any thromboses. But given ehr age and risk of thromboses, it would be important to rule out primary myeloproliferative process such as ET and I will consider bone marrow biopsy at that time  Discussed risks and benefits of IV iron including all but not limited to risk of infusion reactions, headache, fatigue, leg swelling. Patient understands and agrees to proceed with IV iron if need be.  Also discussed the risks and benefits of bone marrow biopsy including but not limited to mild pain and discomfort in the bone marrow biopsy site and bleeding. Patient understands and agrees to proceed with a bone marrow biopsy pending iron studies  I will tentatively see the patient back in 4 months time with a repeat CBC with differential   Visit Diagnosis 1. Thrombocytosis ([place])   2. Iron deficiency      Dr. Randa Evens, MD, MPH [facility] Pager- [phone] 02/13/2017 10:43 AM

## 2017-02-13 ENCOUNTER — Inpatient Hospital Stay: Payer: PPO | Attending: Oncology

## 2017-02-13 ENCOUNTER — Inpatient Hospital Stay (HOSPITAL_BASED_OUTPATIENT_CLINIC_OR_DEPARTMENT_OTHER): Payer: PPO | Admitting: Oncology

## 2017-02-13 ENCOUNTER — Encounter: Payer: Self-pay | Admitting: Oncology

## 2017-02-13 VITALS — BP 124/74 | HR 57 | Temp 97.5°F | Resp 18 | Ht 60.0 in | Wt 154.9 lb

## 2017-02-13 DIAGNOSIS — M25562 Pain in left knee: Secondary | ICD-10-CM | POA: Diagnosis not present

## 2017-02-13 DIAGNOSIS — E7800 Pure hypercholesterolemia, unspecified: Secondary | ICD-10-CM | POA: Insufficient documentation

## 2017-02-13 DIAGNOSIS — M810 Age-related osteoporosis without current pathological fracture: Secondary | ICD-10-CM

## 2017-02-13 DIAGNOSIS — Z8719 Personal history of other diseases of the digestive system: Secondary | ICD-10-CM | POA: Diagnosis not present

## 2017-02-13 DIAGNOSIS — Z79899 Other long term (current) drug therapy: Secondary | ICD-10-CM | POA: Insufficient documentation

## 2017-02-13 DIAGNOSIS — D473 Essential (hemorrhagic) thrombocythemia: Secondary | ICD-10-CM

## 2017-02-13 DIAGNOSIS — Z7982 Long term (current) use of aspirin: Secondary | ICD-10-CM | POA: Diagnosis not present

## 2017-02-13 DIAGNOSIS — I10 Essential (primary) hypertension: Secondary | ICD-10-CM | POA: Diagnosis not present

## 2017-02-13 DIAGNOSIS — M419 Scoliosis, unspecified: Secondary | ICD-10-CM | POA: Insufficient documentation

## 2017-02-13 DIAGNOSIS — K219 Gastro-esophageal reflux disease without esophagitis: Secondary | ICD-10-CM | POA: Diagnosis not present

## 2017-02-13 DIAGNOSIS — M25561 Pain in right knee: Secondary | ICD-10-CM | POA: Diagnosis not present

## 2017-02-13 DIAGNOSIS — M25661 Stiffness of right knee, not elsewhere classified: Secondary | ICD-10-CM | POA: Diagnosis not present

## 2017-02-13 DIAGNOSIS — E611 Iron deficiency: Secondary | ICD-10-CM | POA: Diagnosis not present

## 2017-02-13 DIAGNOSIS — R600 Localized edema: Secondary | ICD-10-CM | POA: Diagnosis not present

## 2017-02-13 DIAGNOSIS — M25662 Stiffness of left knee, not elsewhere classified: Secondary | ICD-10-CM | POA: Diagnosis not present

## 2017-02-13 DIAGNOSIS — M170 Bilateral primary osteoarthritis of knee: Secondary | ICD-10-CM | POA: Diagnosis not present

## 2017-02-13 DIAGNOSIS — D75839 Thrombocytosis, unspecified: Secondary | ICD-10-CM

## 2017-02-13 DIAGNOSIS — D509 Iron deficiency anemia, unspecified: Secondary | ICD-10-CM

## 2017-02-13 LAB — CBC WITH DIFFERENTIAL/PLATELET
BASOS ABS: 0.1 10*3/uL (ref 0–0.1)
BASOS PCT: 1 %
EOS PCT: 5 %
Eosinophils Absolute: 0.3 10*3/uL (ref 0–0.7)
HCT: 36.2 % (ref 35.0–47.0)
Hemoglobin: 12.4 g/dL (ref 12.0–16.0)
Lymphocytes Relative: 24 %
Lymphs Abs: 1.3 10*3/uL (ref 1.0–3.6)
MCH: 29.4 pg (ref 26.0–34.0)
MCHC: 34.4 g/dL (ref 32.0–36.0)
MCV: 85.5 fL (ref 80.0–100.0)
MONO ABS: 0.5 10*3/uL (ref 0.2–0.9)
Monocytes Relative: 10 %
NEUTROS ABS: 3.3 10*3/uL (ref 1.4–6.5)
Neutrophils Relative %: 60 %
PLATELETS: 463 10*3/uL — AB (ref 150–440)
RBC: 4.23 MIL/uL (ref 3.80–5.20)
RDW: 14.1 % (ref 11.5–14.5)
WBC: 5.4 10*3/uL (ref 3.6–11.0)

## 2017-02-13 LAB — IRON AND TIBC
Iron: 79 ug/dL (ref 28–170)
SATURATION RATIOS: 19 % (ref 10.4–31.8)
TIBC: 419 ug/dL (ref 250–450)
UIBC: 340 ug/dL

## 2017-02-13 LAB — FERRITIN: Ferritin: 68 ng/mL (ref 11–307)

## 2017-02-13 NOTE — Progress Notes (Signed)
Pt feels better being on iron pills.

## 2017-02-16 DIAGNOSIS — M25561 Pain in right knee: Secondary | ICD-10-CM | POA: Diagnosis not present

## 2017-02-16 DIAGNOSIS — M25661 Stiffness of right knee, not elsewhere classified: Secondary | ICD-10-CM | POA: Diagnosis not present

## 2017-02-16 DIAGNOSIS — M25662 Stiffness of left knee, not elsewhere classified: Secondary | ICD-10-CM | POA: Diagnosis not present

## 2017-02-16 DIAGNOSIS — R600 Localized edema: Secondary | ICD-10-CM | POA: Diagnosis not present

## 2017-02-16 DIAGNOSIS — M25562 Pain in left knee: Secondary | ICD-10-CM | POA: Diagnosis not present

## 2017-02-21 DIAGNOSIS — M25561 Pain in right knee: Secondary | ICD-10-CM | POA: Diagnosis not present

## 2017-02-21 DIAGNOSIS — R600 Localized edema: Secondary | ICD-10-CM | POA: Diagnosis not present

## 2017-02-21 DIAGNOSIS — M170 Bilateral primary osteoarthritis of knee: Secondary | ICD-10-CM | POA: Diagnosis not present

## 2017-02-21 DIAGNOSIS — M25562 Pain in left knee: Secondary | ICD-10-CM | POA: Diagnosis not present

## 2017-02-23 DIAGNOSIS — M25561 Pain in right knee: Secondary | ICD-10-CM | POA: Diagnosis not present

## 2017-02-23 DIAGNOSIS — M25562 Pain in left knee: Secondary | ICD-10-CM | POA: Diagnosis not present

## 2017-02-23 DIAGNOSIS — R600 Localized edema: Secondary | ICD-10-CM | POA: Diagnosis not present

## 2017-02-23 DIAGNOSIS — M170 Bilateral primary osteoarthritis of knee: Secondary | ICD-10-CM | POA: Diagnosis not present

## 2017-02-27 DIAGNOSIS — M170 Bilateral primary osteoarthritis of knee: Secondary | ICD-10-CM | POA: Diagnosis not present

## 2017-02-27 DIAGNOSIS — M25562 Pain in left knee: Secondary | ICD-10-CM | POA: Diagnosis not present

## 2017-02-27 DIAGNOSIS — M25561 Pain in right knee: Secondary | ICD-10-CM | POA: Diagnosis not present

## 2017-02-27 DIAGNOSIS — R609 Edema, unspecified: Secondary | ICD-10-CM | POA: Diagnosis not present

## 2017-03-02 DIAGNOSIS — M25561 Pain in right knee: Secondary | ICD-10-CM | POA: Diagnosis not present

## 2017-03-02 DIAGNOSIS — R609 Edema, unspecified: Secondary | ICD-10-CM | POA: Diagnosis not present

## 2017-03-02 DIAGNOSIS — M25562 Pain in left knee: Secondary | ICD-10-CM | POA: Diagnosis not present

## 2017-03-02 DIAGNOSIS — M170 Bilateral primary osteoarthritis of knee: Secondary | ICD-10-CM | POA: Diagnosis not present

## 2017-03-06 DIAGNOSIS — M25561 Pain in right knee: Secondary | ICD-10-CM | POA: Diagnosis not present

## 2017-03-06 DIAGNOSIS — R600 Localized edema: Secondary | ICD-10-CM | POA: Diagnosis not present

## 2017-03-06 DIAGNOSIS — M25562 Pain in left knee: Secondary | ICD-10-CM | POA: Diagnosis not present

## 2017-03-06 DIAGNOSIS — M170 Bilateral primary osteoarthritis of knee: Secondary | ICD-10-CM | POA: Diagnosis not present

## 2017-03-08 ENCOUNTER — Ambulatory Visit: Payer: PPO | Admitting: Family Medicine

## 2017-03-09 DIAGNOSIS — R609 Edema, unspecified: Secondary | ICD-10-CM | POA: Diagnosis not present

## 2017-03-09 DIAGNOSIS — M25562 Pain in left knee: Secondary | ICD-10-CM | POA: Diagnosis not present

## 2017-03-09 DIAGNOSIS — M170 Bilateral primary osteoarthritis of knee: Secondary | ICD-10-CM | POA: Diagnosis not present

## 2017-03-09 DIAGNOSIS — M25561 Pain in right knee: Secondary | ICD-10-CM | POA: Diagnosis not present

## 2017-03-27 DIAGNOSIS — H353131 Nonexudative age-related macular degeneration, bilateral, early dry stage: Secondary | ICD-10-CM | POA: Diagnosis not present

## 2017-04-06 ENCOUNTER — Ambulatory Visit (INDEPENDENT_AMBULATORY_CARE_PROVIDER_SITE_OTHER): Payer: PPO | Admitting: Family Medicine

## 2017-04-06 ENCOUNTER — Encounter: Payer: Self-pay | Admitting: Family Medicine

## 2017-04-06 VITALS — BP 140/82 | HR 59 | Temp 97.8°F | Wt 151.2 lb

## 2017-04-06 DIAGNOSIS — W19XXXA Unspecified fall, initial encounter: Secondary | ICD-10-CM | POA: Diagnosis not present

## 2017-04-06 DIAGNOSIS — H3530 Unspecified macular degeneration: Secondary | ICD-10-CM | POA: Diagnosis not present

## 2017-04-06 DIAGNOSIS — M25561 Pain in right knee: Secondary | ICD-10-CM

## 2017-04-06 DIAGNOSIS — N644 Mastodynia: Secondary | ICD-10-CM

## 2017-04-06 DIAGNOSIS — G8929 Other chronic pain: Secondary | ICD-10-CM | POA: Diagnosis not present

## 2017-04-06 DIAGNOSIS — M419 Scoliosis, unspecified: Secondary | ICD-10-CM | POA: Insufficient documentation

## 2017-04-06 DIAGNOSIS — Z23 Encounter for immunization: Secondary | ICD-10-CM

## 2017-04-06 DIAGNOSIS — K219 Gastro-esophageal reflux disease without esophagitis: Secondary | ICD-10-CM

## 2017-04-06 MED ORDER — TRIAMCINOLONE ACETONIDE 0.1 % EX CREA: 1.0000 "application " | TOPICAL_CREAM | Freq: Two times a day (BID) | CUTANEOUS | 2 refills | Status: DC | PRN

## 2017-04-06 MED ORDER — PANTOPRAZOLE SODIUM 40 MG PO TBEC: 40.0000 mg | DELAYED_RELEASE_TABLET | Freq: Every day | ORAL | 1 refills | Status: DC

## 2017-04-06 NOTE — Assessment & Plan Note (Signed)
Has been doing well with her knee pain though does have some slight IT band issues. We'll have her see physical therapy for help with this.

## 2017-04-06 NOTE — Assessment & Plan Note (Signed)
Patient was single fall while in her garden. He does have some soreness following this. Bruised face. Normal neurological exam. No loss of consciousness. Suspect multifactorial cause including right leg issues and scoliosis. We'll have her work with physical therapy to strengthen her legs and work on her balance.

## 2017-04-06 NOTE — Progress Notes (Signed)
Tommi Rumps, MD Phone: [phone]  Lindsay Tran is a 71 y.o. female who presents today for follow-up.  Scoliosis: Patient notes thoracic back pain related to her scoliosis. Typically bothers her if she is standing for any period of time. No numbness or weakness. No radiation. She would like to see somebody to work on exercises for this.  Has seen orthopedics for right knee pain. Had injections which were beneficial. Does note some slight IT band issues. Wants to see physical therapy through [facility].  Patient reports one week ago she fell in her garden. She just doesn't feel all that steady on her feet and then started to fall. She remembers falling. No loss of consciousness. She landed on her left side with no injury other than left aspect of her maxillary bone with a bruise. She's had no headaches. She had no chest pain or palpitations. Notes her legs were sore from trying to get up.  GERD: Saw GI. They plan on doing an EGD next week. She is on Protonix. This helps with her symptoms.  Patient notes a little over a week ago she had a tingling sensation in a very specific spot in her left breast at the 12:00 position. She has not felt anything abnormal when palpating. Notes that went away on its own.  PMH: nonsmoker.   ROS see history of present illness  Objective  Physical Exam Vitals:   04/06/17 1011  BP: 140/82  Pulse: (!) 59  Temp: 97.8 F (36.6 C)  SpO2: 96%    BP Readings from Last 3 Encounters:  04/06/17 140/82  02/13/17 124/74  01/02/17 140/82   Wt Readings from Last 3 Encounters:  04/06/17 151 lb 3.2 oz (68.6 kg)  02/13/17 154 lb 14.4 oz (70.3 kg)  01/02/17 160 lb (72.6 kg)    Physical Exam  Constitutional: No distress.  HENT:  No bony tenderness of the face or nose, there is a bruise over the left maxillary bone with no tenderness, no battle sign, cerumen noted bilaterally ears  Cardiovascular: Normal rate, regular rhythm and normal heart sounds.     Pulmonary/Chest: Effort normal and breath sounds normal.  Genitourinary:  Genitourinary Comments: Right breast in the lateral tissue with some slight tenderness that started after the patient fell previously. No bony defects. No noted other tenderness, skin changes, nipple changes, or axillary changes  Musculoskeletal: She exhibits no edema.  Neurological: She is alert.  CN 2-12 intact, 5/5 strength in bilateral biceps, triceps, grip, quads, hamstrings, plantar and dorsiflexion, sensation to light touch intact in bilateral UE and LE  Skin: Skin is warm and dry. She is not diaphoretic.     Assessment/Plan: Please see individual problem list.  Acid reflux Protonix refill. Continue to follow with GI.  Scoliosis Patient with scoliosis. Has discomfort related to this. We'll have her work with physical therapy on this.  Chronic pain of right knee Has been doing well with her knee pain though does have some slight IT band issues. We'll have her see physical therapy for help with this.  Fall Patient was single fall while in her garden. He does have some soreness following this. Bruised face. Normal neurological exam. No loss of consciousness. Suspect multifactorial cause including right leg issues and scoliosis. We'll have her work with physical therapy to strengthen her legs and work on her balance.  Breast pain, left Patient with episode of left breast tingling. Benign exam today the left breast does have some tenderness in the right lateral  breast tissue that I suspect is muscular related to pushing herself up from her recent fall. Will obtain mammogram and ultrasound.  Macular degeneration Recent diagnosis. Followed by ophthalmology. She'll keep follow-up with them.   Orders Placed This Encounter  Procedures  . MM DIAG BREAST TOMO BILATERAL    Standing Status:   Future    Standing Expiration Date:   06/06/2018    Order Specific Question:   Reason for Exam (SYMPTOM  OR DIAGNOSIS  REQUIRED)    Answer:   left breast tingling discomfort, 12:00 location, no palpable defect    Order Specific Question:   Preferred imaging location?    Answer:   [facility]  . US BREAST LTD UNI LEFT INC AXILLA    Standing Status:   Future    Standing Expiration Date:   06/06/2018    Order Specific Question:   Reason for Exam (SYMPTOM  OR DIAGNOSIS REQUIRED)    Answer:   left breast tingling discomfort, 12:00 location, no palpable defect    Order Specific Question:   Preferred imaging location?    Answer:   [facility]  . US BREAST LTD UNI RIGHT INC AXILLA    Standing Status:   Future    Standing Expiration Date:   06/06/2018    Order Specific Question:   Reason for Exam (SYMPTOM  OR DIAGNOSIS REQUIRED)    Answer:   left breast tingling discomfort, 12:00 location, no palpable defect    Order Specific Question:   Preferred imaging location?    Answer:   [facility]  . Flu vaccine HIGH DOSE PF  . Ambulatory referral to Physical Therapy    Referral Priority:   Routine    Referral Type:   Physical Medicine    Referral Reason:   Specialty Services Required    Requested Specialty:   Physical Therapy    Number of Visits Requested:   1    Meds ordered this encounter  Medications  . Multiple Vitamins-Minerals (PRESERVISION AREDS 2 PO)    Sig: Take by mouth.  . pantoprazole (PROTONIX) 40 MG tablet    Sig: Take 1 tablet (40 mg total) by mouth daily.    Dispense:  90 tablet    Refill:  1  . triamcinolone cream (KENALOG) 0.1 %    Sig: Apply 1 application topically 2 (two) times daily as needed.    Dispense:  30 g    Refill:  2    Tommi Rumps, MD [place]

## 2017-04-06 NOTE — Patient Instructions (Signed)
Nice to see you. We will get you set up with physical therapy. We'll get you set up for mammogram. Please be very wary with your balance and if you have another fall please let us know.

## 2017-04-06 NOTE — Assessment & Plan Note (Signed)
Patient with scoliosis. Has discomfort related to this. We'll have her work with physical therapy on this.

## 2017-04-06 NOTE — Assessment & Plan Note (Signed)
Patient with episode of left breast tingling. Benign exam today the left breast does have some tenderness in the right lateral breast tissue that I suspect is muscular related to pushing herself up from her recent fall. Will obtain mammogram and ultrasound.

## 2017-04-06 NOTE — Assessment & Plan Note (Signed)
Recent diagnosis. Followed by ophthalmology. She'll keep follow-up with them.

## 2017-04-06 NOTE — Assessment & Plan Note (Signed)
Protonix refill. Continue to follow with GI.

## 2017-04-13 ENCOUNTER — Encounter: Payer: Self-pay | Admitting: *Deleted

## 2017-04-16 ENCOUNTER — Encounter: Payer: Self-pay | Admitting: Anesthesiology

## 2017-04-16 ENCOUNTER — Ambulatory Visit
Admission: RE | Admit: 2017-04-16 | Discharge: 2017-04-16 | Disposition: A | Discharge status: Home / Self Care | Payer: PPO | Source: Ambulatory Visit | Attending: Unknown Physician Specialty | Admitting: Unknown Physician Specialty

## 2017-04-16 ENCOUNTER — Ambulatory Visit: Payer: PPO | Admitting: Anesthesiology

## 2017-04-16 ENCOUNTER — Encounter
Admission: RE | Disposition: A | Discharge status: Home / Self Care | Payer: Self-pay | Source: Ambulatory Visit | Attending: Unknown Physician Specialty

## 2017-04-16 DIAGNOSIS — Z8489 Family history of other specified conditions: Secondary | ICD-10-CM | POA: Insufficient documentation

## 2017-04-16 DIAGNOSIS — K2970 Gastritis, unspecified, without bleeding: Secondary | ICD-10-CM | POA: Diagnosis not present

## 2017-04-16 DIAGNOSIS — K219 Gastro-esophageal reflux disease without esophagitis: Secondary | ICD-10-CM | POA: Insufficient documentation

## 2017-04-16 DIAGNOSIS — H8110 Benign paroxysmal vertigo, unspecified ear: Secondary | ICD-10-CM | POA: Diagnosis not present

## 2017-04-16 DIAGNOSIS — Z800 Family history of malignant neoplasm of digestive organs: Secondary | ICD-10-CM | POA: Diagnosis not present

## 2017-04-16 DIAGNOSIS — F419 Anxiety disorder, unspecified: Secondary | ICD-10-CM | POA: Insufficient documentation

## 2017-04-16 DIAGNOSIS — Z833 Family history of diabetes mellitus: Secondary | ICD-10-CM | POA: Insufficient documentation

## 2017-04-16 DIAGNOSIS — K253 Acute gastric ulcer without hemorrhage or perforation: Secondary | ICD-10-CM | POA: Insufficient documentation

## 2017-04-16 DIAGNOSIS — R1310 Dysphagia, unspecified: Secondary | ICD-10-CM | POA: Diagnosis not present

## 2017-04-16 DIAGNOSIS — I10 Essential (primary) hypertension: Secondary | ICD-10-CM | POA: Diagnosis not present

## 2017-04-16 DIAGNOSIS — Z820 Family history of epilepsy and other diseases of the nervous system: Secondary | ICD-10-CM | POA: Diagnosis not present

## 2017-04-16 DIAGNOSIS — K2900 Acute gastritis without bleeding: Secondary | ICD-10-CM | POA: Diagnosis not present

## 2017-04-16 DIAGNOSIS — M1990 Unspecified osteoarthritis, unspecified site: Secondary | ICD-10-CM | POA: Diagnosis not present

## 2017-04-16 DIAGNOSIS — Z7982 Long term (current) use of aspirin: Secondary | ICD-10-CM | POA: Diagnosis not present

## 2017-04-16 DIAGNOSIS — E7800 Pure hypercholesterolemia, unspecified: Secondary | ICD-10-CM | POA: Insufficient documentation

## 2017-04-16 DIAGNOSIS — Z79899 Other long term (current) drug therapy: Secondary | ICD-10-CM | POA: Insufficient documentation

## 2017-04-16 HISTORY — DX: Atherosclerotic heart disease of native coronary artery without angina pectoris: I25.10

## 2017-04-16 HISTORY — PX: ESOPHAGOGASTRODUODENOSCOPY (EGD) WITH PROPOFOL: SHX5813

## 2017-04-16 HISTORY — DX: Dysphasia: R47.02

## 2017-04-16 HISTORY — DX: Benign paroxysmal vertigo, unspecified ear: H81.10

## 2017-04-16 HISTORY — DX: Noninfective gastroenteritis and colitis, unspecified: K52.9

## 2017-04-16 HISTORY — DX: Unspecified abdominal pain: R10.9

## 2017-04-16 SURGERY — ESOPHAGOGASTRODUODENOSCOPY (EGD) WITH PROPOFOL
Anesthesia: General

## 2017-04-16 MED ORDER — GLYCOPYRROLATE 0.2 MG/ML IJ SOLN
INTRAMUSCULAR | Status: AC
  Filled 2017-04-16: qty 1

## 2017-04-16 MED ORDER — LIDOCAINE HCL (PF) 2 % IJ SOLN
INTRAMUSCULAR | Status: DC | PRN
  Administered 2017-04-16: 60 mg

## 2017-04-16 MED ORDER — PROPOFOL 10 MG/ML IV BOLUS
INTRAVENOUS | Status: DC | PRN
  Administered 2017-04-16: 30 mg via INTRAVENOUS

## 2017-04-16 MED ORDER — MIDAZOLAM HCL 2 MG/2ML IJ SOLN
INTRAMUSCULAR | Status: AC
  Filled 2017-04-16: qty 2

## 2017-04-16 MED ORDER — SODIUM CHLORIDE 0.9 % IV SOLN
INTRAVENOUS | Status: DC
  Administered 2017-04-16: 10:00:00 via INTRAVENOUS

## 2017-04-16 MED ORDER — PROPOFOL 500 MG/50ML IV EMUL
INTRAVENOUS | Status: DC | PRN
  Administered 2017-04-16: 50 ug/kg/min via INTRAVENOUS

## 2017-04-16 MED ORDER — MIDAZOLAM HCL 5 MG/5ML IJ SOLN
INTRAMUSCULAR | Status: DC | PRN
  Administered 2017-04-16: 1 mg via INTRAVENOUS

## 2017-04-16 MED ORDER — FENTANYL CITRATE (PF) 100 MCG/2ML IJ SOLN
INTRAMUSCULAR | Status: DC | PRN
  Administered 2017-04-16: 50 ug via INTRAVENOUS

## 2017-04-16 MED ORDER — LIDOCAINE HCL (PF) 2 % IJ SOLN
INTRAMUSCULAR | Status: AC
Start: 2017-04-16 — End: 2017-04-16
  Filled 2017-04-16: qty 10

## 2017-04-16 MED ORDER — FENTANYL CITRATE (PF) 100 MCG/2ML IJ SOLN
INTRAMUSCULAR | Status: AC
  Filled 2017-04-16: qty 2

## 2017-04-16 MED ORDER — GLYCOPYRROLATE 0.2 MG/ML IJ SOLN
INTRAMUSCULAR | Status: DC | PRN
  Administered 2017-04-16: 0.2 mg via INTRAVENOUS

## 2017-04-16 MED ORDER — SODIUM CHLORIDE 0.9 % IV SOLN: INTRAVENOUS | Status: DC

## 2017-04-16 NOTE — Anesthesia Post-op Follow-up Note (Signed)
Anesthesia QCDR form completed.        

## 2017-04-16 NOTE — Anesthesia Preprocedure Evaluation (Signed)
Anesthesia Evaluation  Patient identified by MRN, date of birth, ID band Patient awake    Reviewed: Allergy & Precautions, NPO status , Patient's Chart, lab work & pertinent test results, reviewed documented beta blocker date and time   Airway Mallampati: II  TM Distance: >3 FB     Dental  (+) Chipped   Pulmonary           Cardiovascular hypertension, Pt. on medications and Pt. on home beta blockers + CAD       Neuro/Psych PSYCHIATRIC DISORDERS Anxiety  Neuromuscular disease    GI/Hepatic PUD, GERD  ,  Endo/Other    Renal/GU      Musculoskeletal  (+) Arthritis ,   Abdominal   Peds  Hematology  (+) anemia ,   Anesthesia Other Findings   Reproductive/Obstetrics                             Anesthesia Physical Anesthesia Plan  ASA: III  Anesthesia Plan: General   Post-op Pain Management:    Induction: Intravenous  PONV Risk Score and Plan:   Airway Management Planned:   Additional Equipment:   Intra-op Plan:   Post-operative Plan:   Informed Consent: I have reviewed the patients History and Physical, chart, labs and discussed the procedure including the risks, benefits and alternatives for the proposed anesthesia with the patient or authorized representative who has indicated his/her understanding and acceptance.     Plan Discussed with: CRNA  Anesthesia Plan Comments:         Anesthesia Quick Evaluation

## 2017-04-16 NOTE — Transfer of Care (Signed)
Immediate Anesthesia Transfer of Care Note  Patient: Lindsay Tran  Procedure(s) Performed: ESOPHAGOGASTRODUODENOSCOPY (EGD) WITH PROPOFOL (N/A )  Patient Location: PACU  Anesthesia Type:General  Level of Consciousness: sedated  Airway & Oxygen Therapy: Patient Spontanous Breathing and Patient connected to nasal cannula oxygen  Post-op Assessment: Report given to RN and Post -op Vital signs reviewed and stable  Post vital signs: Reviewed and stable  Last Vitals:  Vitals:   04/16/17 0945 04/16/17 1051  BP: (!) 156/66 128/69  Pulse: (!) 57 74  Resp: 16 13  Temp: 36.9 C (!) 36 C  SpO2: 99% 98%    Last Pain:  Vitals:   04/16/17 1051  TempSrc: Tympanic         Complications: No apparent anesthesia complications

## 2017-04-16 NOTE — H&P (Signed)
Primary Care Physician:  Leone Haven, MD Primary Gastroenterologist:  Dr. Vira Agar  Pre-Procedure History & Physical: HPI:  Lindsay Tran is a 70 y.o. female is here for an endoscopy.   Past Medical History:  Diagnosis Date  . Abdominal pain   . Anemia   . Anxiety    adjustment disorder with anxiety  . Arthritis   . BPPV (benign paroxysmal positional vertigo)   . Cataract   . Chronic diarrhea   . Complication of anesthesia   . Coronary artery disease   . Dysphasia   . Gastric ulcer 1980's  . GERD (gastroesophageal reflux disease)   . High cholesterol   . Hypertension   . Osteoporosis   . Schatzki's ring 05/02/2013  . Scoliosis     Past Surgical History:  Procedure Laterality Date  . ABDOMINAL HYSTERECTOMY    . CARDIAC CATHETERIZATION    . CHOLECYSTECTOMY    . COLONOSCOPY    . COLONOSCOPY N/A 11/12/2014   Procedure: COLONOSCOPY;  Surgeon: Manya Silvas, MD;  Location: [facility] ENDOSCOPY;  Service: Endoscopy;  Laterality: N/A;  . DIAGNOSTIC LAPAROSCOPY     paraesphogeal hernia  . ESOPHAGOGASTRODUODENOSCOPY    . HERNIA REPAIR     inguinal hernia    Prior to Admission medications   Medication Sig Start Date End Date Taking? Authorizing Provider  aspirin 81 MG tablet Take 81 mg by mouth daily.   Yes [provider]  fenofibrate 160 MG tablet TAKE ONE TABLET BY MOUTH ONCE DAILY 08/07/16  Yes Leone Haven, MD  ferrous sulfate 325 (65 FE) MG tablet Take 325 mg by mouth 2 (two) times daily with a meal.   Yes [provider]  metoprolol (LOPRESSOR) 100 MG tablet TAKE ONE TABLET BY MOUTH TWICE DAILY 08/07/16  Yes Leone Haven, MD  Misc Natural Products (OSTEO BI-FLEX/5-LOXIN ADVANCED) TABS Take 1 tablet by mouth 2 (two) times daily.   Yes [provider]  Multiple Vitamins-Minerals (PRESERVISION AREDS 2 PO) Take by mouth.   Yes [provider]  Omega-3 Fatty Acids (OMEGA 3 500 PO) Take 1 capsule by mouth daily.    Yes  [provider]  pantoprazole (PROTONIX) 40 MG tablet Take 1 tablet (40 mg total) by mouth daily. 04/06/17  Yes Leone Haven, MD  potassium chloride (K-DUR) 10 MEQ tablet Take 10 mEq by mouth daily.   Yes [provider]  Probiotic Product (PROBIOTIC DAILY) CAPS Take 1 capsule by mouth.   Yes [provider]  simvastatin (ZOCOR) 40 MG tablet TAKE ONE TABLET BY MOUTH ONCE DAILY AT  6  PM 08/07/16  Yes Leone Haven, MD  cholestyramine light 4 G POWD Take by mouth 1 day or 1 dose.    [provider]  clobetasol (OLUX) 0.05 % topical foam Apply topically 2 (two) times daily. Please dispense 3 bottles. 08/27/15   Rubbie Battiest, RN  clobetasol ointment (TEMOVATE) 7.86 % Apply 1 application topically 2 (two) times daily. 08/26/15   Rubbie Battiest, RN  conjugated estrogens (PREMARIN) vaginal cream Place 1 Applicatorful vaginally 2 (two) times a week. Dispense a 90 day supply. 12/07/16   Leone Haven, MD  ketoconazole (NIZORAL) 2 % cream Apply 1 application topically daily as needed.  08/07/16   [provider]  meclizine (ANTIVERT) 25 MG tablet Take 1 tablet (25 mg total) by mouth 2 (two) times daily as needed for dizziness. 12/24/15   Leone Haven, MD  mupirocin ointment (BACTROBAN) 2 % Place 1 application into the nose 2 (two) times daily. Patient taking differently: Place 1 application into the nose 2 (two) times daily as needed.  01/02/17   Leone Haven, MD  naproxen sodium (ANAPROX) 220 MG tablet Take 220 mg by mouth daily. Sometimes pt takes twice a day if needed from pain    [provider]  triamcinolone cream (KENALOG) 0.1 % Apply 1 application topically 2 (two) times daily as needed. 04/06/17   Leone Haven, MD    Allergies as of 02/13/2017  . (No Known Allergies)    Family History  Problem Relation Age of Onset  . Diabetes Mother   . Parkinson's disease Mother   . Cancer Father        Pancreatic  . Cancer  Sister        Lung  . Diabetes Sister   . Alzheimer's disease Maternal Aunt   . Alzheimer's disease Paternal Aunt   . Diabetes Sister   . Alzheimer's disease Sister   . Tics Sister   . Breast cancer Neg Hx     Social History   Social History  . Marital status: Single    Spouse name: N/A  . Number of children: N/A  . Years of education: N/A   Occupational History  . Not on file.   Social History Main Topics  . Smoking status: Never Smoker  . Smokeless tobacco: Never Used  . Alcohol use No  . Drug use: No  . Sexual activity: No   Other Topics Concern  . Not on file   Social History Narrative   Divorced   Retired as a Scientist, research (medical)   12 + years of school     Review of Systems: See HPI, otherwise negative ROS  Physical Exam: BP (!) 156/66   Pulse (!) 57   Temp 98.5 F (36.9 C) (Tympanic)   Resp 16   Ht 5' (1.524 m)   Wt 68.5 kg (151 lb)   SpO2 99%   BMI 29.49 kg/m  General:   Alert,  pleasant and cooperative in NAD Head:  Normocephalic and atraumatic. Neck:  Supple; no masses or thyromegaly. Lungs:  Clear throughout to auscultation.    Heart:  Regular rate and rhythm. Abdomen:  Soft, nontender and nondistended. Normal bowel sounds, without guarding, and without rebound.   Neurologic:  Alert and  oriented x4;  grossly normal neurologically.  Impression/Plan: Lindsay Tran is here for an endoscopy to be performed for dysphagia.  Risks, benefits, limitations, and alternatives regarding  endoscopy have been reviewed with the patient.  Questions have been answered.  All parties agreeable.   Gaylyn Cheers, MD  04/16/2017, 10:31 AM

## 2017-04-16 NOTE — Anesthesia Postprocedure Evaluation (Signed)
Anesthesia Post Note  Patient: Lindsay Tran  Procedure(s) Performed: ESOPHAGOGASTRODUODENOSCOPY (EGD) WITH PROPOFOL (N/A )  Patient location during evaluation: Endoscopy Anesthesia Type: General Level of consciousness: awake and alert Pain management: pain level controlled Vital Signs Assessment: post-procedure vital signs reviewed and stable Respiratory status: spontaneous breathing, nonlabored ventilation, respiratory function stable and patient connected to nasal cannula oxygen Cardiovascular status: blood pressure returned to baseline and stable Postop Assessment: no apparent nausea or vomiting Anesthetic complications: no     Last Vitals:  Vitals:   04/16/17 1111 04/16/17 1121  BP: 137/82 132/70  Pulse: 73 69  Resp: 20 16  Temp:    SpO2: 98% 95%    Last Pain:  Vitals:   04/16/17 1051  TempSrc: Tympanic                 Tondra Reierson S

## 2017-04-16 NOTE — Op Note (Signed)
[facility] Patient Name: Lindsay Tran Procedure Date: 04/16/2017 10:20 AM MRN: [id number] Account #: [id number] Date of Birth: 05/08/1946 Admit Type: Outpatient Age: 71 Room: [facility] ENDO ROOM 3 Gender: Female Note Status: Finalized Procedure:            Upper GI endoscopy Indications:          Dysphagia Providers:            Manya Silvas, MD Referring MD:         Angela Adam. Caryl Bis (Referring MD) Medicines:            Propofol per Anesthesia Complications:        No immediate complications. Procedure:            Pre-Anesthesia Assessment:                       - After reviewing the risks and benefits, the patient                        was deemed in satisfactory condition to undergo the                        procedure.                       After obtaining informed consent, the endoscope was                        passed under direct vision. Throughout the procedure,                        the patient's blood pressure, pulse, and oxygen                        saturations were monitored continuously. The Endoscope                        was introduced through the mouth, and advanced to the                        second part of duodenum. The upper GI endoscopy was                        accomplished without difficulty. The patient tolerated                        the procedure well. The upper GI endoscopy was                        accomplished without difficulty. The patient tolerated                        the procedure well. Findings:      The examined esophagus was normal. Retroflexion of scope showed GEJ       intact. After exam of the stomach and duodenum A guidewire was placed       and the scope was withdrawn. Dilation was performed with a Savary       dilator with mild resistance at 15 mm.      Localized mild inflammation characterized by erosions, erythema and  granularity was found in the gastric antrum. Biopsies were taken  with a       cold forceps for histology. Biopsies were taken with a cold forceps for       Helicobacter pylori testing.      The duodenal bulb and second portion of the duodenum were normal. Impression:           - Normal esophagus. Dilated.                       - Gastritis. Biopsied.                       - Normal duodenal bulb and second portion of the                        duodenum. Recommendation:       - Await pathology results. Manya Silvas, MD 04/16/2017 10:51:28 AM This report has been signed electronically. Number of Addenda: 0 Note Initiated On: 04/16/2017 10:20 AM      [facility]

## 2017-04-17 ENCOUNTER — Encounter: Payer: Self-pay | Admitting: Unknown Physician Specialty

## 2017-04-17 LAB — SURGICAL PATHOLOGY

## 2017-04-18 ENCOUNTER — Ambulatory Visit
Admission: RE | Admit: 2017-04-18 | Discharge: 2017-04-18 | Disposition: A | Discharge status: Home / Self Care | Payer: PPO | Source: Ambulatory Visit | Attending: Family Medicine | Admitting: Family Medicine

## 2017-04-18 DIAGNOSIS — N644 Mastodynia: Secondary | ICD-10-CM | POA: Diagnosis not present

## 2017-04-18 DIAGNOSIS — R928 Other abnormal and inconclusive findings on diagnostic imaging of breast: Secondary | ICD-10-CM | POA: Diagnosis not present

## 2017-04-18 DIAGNOSIS — N6489 Other specified disorders of breast: Secondary | ICD-10-CM | POA: Diagnosis not present

## 2017-05-01 ENCOUNTER — Ambulatory Visit: Payer: PPO | Attending: Family Medicine | Admitting: Physical Therapy

## 2017-05-01 ENCOUNTER — Encounter: Payer: Self-pay | Admitting: Physical Therapy

## 2017-05-01 DIAGNOSIS — M25561 Pain in right knee: Secondary | ICD-10-CM | POA: Diagnosis not present

## 2017-05-01 DIAGNOSIS — M25562 Pain in left knee: Secondary | ICD-10-CM | POA: Diagnosis not present

## 2017-05-01 DIAGNOSIS — G8929 Other chronic pain: Secondary | ICD-10-CM | POA: Insufficient documentation

## 2017-05-01 DIAGNOSIS — R262 Difficulty in walking, not elsewhere classified: Secondary | ICD-10-CM | POA: Insufficient documentation

## 2017-05-01 DIAGNOSIS — M546 Pain in thoracic spine: Secondary | ICD-10-CM | POA: Diagnosis not present

## 2017-05-01 NOTE — Therapy (Signed)
[facility] [address] [place], [address] Phone: [phone]   Fax:  [phone]  Physical Therapy Evaluation  Patient Details  Name: Lindsay Tran MRN: [id number] Date of Birth: [birth year] Referring Provider: Dr. Caryl Bis  Encounter Date: 05/01/2017      PT End of Session - 05/01/17 1225    Visit Number 1   Number of Visits 12   Date for PT Re-Evaluation 06/15/17   Authorization Type 1   Authorization Time Period of 10 g-code   PT Start Time 0930   PT Stop Time 1030   PT Time Calculation (min) 60 min   Activity Tolerance Patient tolerated treatment well   Behavior During Therapy [facility] for tasks assessed/performed      Past Medical History:  Diagnosis Date  . Abdominal pain   . Anemia   . Anxiety    adjustment disorder with anxiety  . Arthritis   . BPPV (benign paroxysmal positional vertigo)   . Cataract   . Chronic diarrhea   . Complication of anesthesia   . Coronary artery disease   . Dysphasia   . Gastric ulcer 1980's  . GERD (gastroesophageal reflux disease)   . High cholesterol   . Hypertension   . Osteoporosis   . Schatzki's ring 05/02/2013  . Scoliosis     Past Surgical History:  Procedure Laterality Date  . ABDOMINAL HYSTERECTOMY    . CARDIAC CATHETERIZATION    . CHOLECYSTECTOMY    . COLONOSCOPY    . COLONOSCOPY N/A 11/12/2014   Procedure: COLONOSCOPY;  Surgeon: Manya Silvas, MD;  Location: [facility] ENDOSCOPY;  Service: Endoscopy;  Laterality: N/A;  . DIAGNOSTIC LAPAROSCOPY     paraesphogeal hernia  . ESOPHAGOGASTRODUODENOSCOPY    . ESOPHAGOGASTRODUODENOSCOPY (EGD) WITH PROPOFOL N/A 04/16/2017   Procedure: ESOPHAGOGASTRODUODENOSCOPY (EGD) WITH PROPOFOL;  Surgeon: Manya Silvas, MD;  Location: [facility] ENDOSCOPY;  Service: Endoscopy;  Laterality: N/A;  . HERNIA REPAIR     inguinal hernia    There were no vitals filed for this visit.       Subjective Assessment -  05/01/17 1144    Subjective Lindsay Tran is a 71 y.o. female with long hx of back pain due to scoliosis, and a long hx of R knee pain and stiffness per pt report.  She also reports she had a fall about 4 weeks ago where she just lost her footing and bruised her face.  She states she loses her balance often and finds that she ambulates towards the left unconciously.  She states that despite frequent losses of balance she has only fallen once in the past 6 months.  She recently saw Dr. Caryl Bis who referred her for PT to address her back pain from scoliosis, her R knee pain, and her poor balance (see order).  She states that when she is standing for longer periods and using her UE's to do household chores her upper back begins to have a burning, stabbing pain in the R scapular area and she also gets discomfort in her R lumbar region.  She states that sitting or lying down eases the pain.  She c/o R knee stiffness with ambulation and tightness/discomfort in the R lateral knee. This is worsened with ambulation.  She states she had "lubricating injections" for her R knee a few months ago and that did help her knee pain.   Pertinent History Hx of scoliosis and B knee arthritis; Hx of recent fall  Limitations Standing;Walking;House hold activities   How long can you stand comfortably? 5 minutes   How long can you walk comfortably? 5 minutes   Patient Stated Goals To be able to do household chores and stand longer periods with less pain and less loss of balance.   Currently in Pain? Yes   Pain Score 3    Pain Location Thoracic   Pain Orientation Right   Pain Descriptors / Indicators Stabbing   Pain Type Chronic pain   Pain Onset More than a month ago   Pain Frequency Several days a week   Aggravating Factors  Prolonged standing, prolonged ambulation, household chores   Pain Relieving Factors sitting, lying down   Effect of Pain on Daily Activities Pt reports the pain causes her to have to stop whatever  household activitiy she is doing and she must sit or lie down.   Multiple Pain Sites Yes   Pain Location Knee            [facility] PT Assessment - 05/01/17 0001      Assessment   Medical Diagnosis scoliosis, R knee pain, balance dysfunction   Referring Provider Dr. Caryl Bis   Onset Date/Surgical Date 07/10/16   Hand Dominance Right   Prior Therapy Hx of PT for knee pain     Balance Screen   Has the patient fallen in the past 6 months Yes   How many times? 1   Has the patient had a decrease in activity level because of a fear of falling?  No   Is the patient reluctant to leave their home because of a fear of falling?  No     Home Ecologist residence     Prior Function   Level of Independence Independent   Vocation Retired     Associate Professor   Overall Cognitive Status Within Functional Limits for tasks assessed     Observation/Other Assessments   Observations --  Pt has pronounced scoliosis curving to the R in scapular reg     Posture/Postural Control   Posture Comments Pt has forward head, rounded shoulders, and pronounced scoliosis in the thoracic region     ROM / Strength   AROM / PROM / Strength AROM  P     AROM   Overall AROM  Within functional limits for tasks performed   Overall AROM Comments Trunk AROM WFL throughout; R knee AROM 118 flexion and lacks 5 degrees from full extension     Strength   Overall Strength Comments Pt has 4- to 4/5 strength grossly throughout R UE and trunk; 4+-5/5 grossly throughout B LE's     Flexibility   Soft Tissue Assessment /Muscle Length yes   ITB --  moderately decreased   Quadratus Lumborum modertely decreased     Palpation   Spinal mobility Decreased moderately due to scoliosis   Palpation comment No tenderness elicited with palpation at spine; but pt jumped with severe pain with palpation at R tibiofemoral joint line at R knee     Bed Mobility   Bed Mobility --  all WNL; pt log rolls and  uses UE's appropriately     Transfers   Five time sit to stand comments  --  decreased speed and fatigues with reps     Ambulation/Gait   Ambulation/Gait Yes   Gait Comments Pt has B genu valgus and ambulates with shuffling gait pattern and poor dynamic balance.     Balance   Balance  Assessed Yes     Static Standing Balance   Static Standing - Level of Assistance 4: Min assist   Static Standing Balance -  Activities  Single Leg Stance - Right Leg;Single Leg Stance - Left Leg     Standardized Balance Assessment   Standardized Balance Assessment Berg Balance Test     Berg Balance Test   Sit to Stand Able to stand without using hands and stabilize independently   Standing Unsupported Able to stand safely 2 minutes   Sitting with Back Unsupported but Feet Supported on Floor or Stool Able to sit safely and securely 2 minutes   Stand to Sit Sits safely with minimal use of hands   Transfers Able to transfer safely, minor use of hands   Standing Unsupported with Eyes Closed Able to stand 10 seconds safely   Standing Ubsupported with Feet Together Able to place feet together independently and stand for 1 minute with supervision   From Standing, Reach Forward with Outstretched Arm Can reach forward >12 cm safely (5")   From Standing Position, Pick up Object from [place] to pick up shoe safely and easily   From Standing Position, Turn to Look Behind Over each Shoulder Looks behind one side only/other side shows less weight shift   Turn 360 Degrees Able to turn 360 degrees safely in 4 seconds or less   Standing Unsupported, Alternately Place Feet on Step/Stool Able to stand independently and complete 8 steps >20 seconds   Standing Unsupported, One Foot in [employer] balance while stepping or standing   Standing on One Leg Tries to lift leg/unable to hold 3 seconds but remains standing independently   Total Score 45   Berg comment: scored 45 at initial evaluation     Functional Gait   Assessment   FGA comment: Pt has shuffling gait pattern and difficulty maintaining balance; she has B genu valgus            Objective measurements completed on examination: See above findings.   Therapeutic Ex's performed today:  Seated mid-thoracic stretch 5x20 sec; Throacic forward seated stretch 5x20 sec; Theraband rows with RTB 20x; SAQ with 2# ankle wt 2x10; Tandem stance and Single Leg Stance near counter (all added to HEP).               PT Education - 05/01/17 1015    Education provided Yes   Education Details New HEP as written in patient instructions   Person(s) Educated Patient   Methods Demonstration;Explanation;Handout   Comprehension Returned demonstration          PT Short Term Goals - 05/01/17 1236      PT SHORT TERM GOAL #1   Title Pt will be educated in and I with a new home exercise program to improve overall strength, flexibility, and ROM, as well as improve balance.   Baseline no exercise home program   Time 1   Period Weeks   Status New   Target Date 05/08/17           PT Long Term Goals - 05/01/17 1237      PT LONG TERM GOAL #1   Title Pt will have an improved Berg Balance score by at least 30% indicating less fall risk.   Baseline 45 at intial PT eval   Time 6   Period Weeks   Status New   Target Date 06/12/17     PT LONG TERM GOAL #2   Title Pt will  report being able to perform all household chores with less pain and difficulty due to improved strength, flexibility, and stamina.   Baseline Pt cannot tolerate standing longer than 5 minutes to do most household chores due to back pain   Time 6   Period Weeks   Status New   Target Date 06/12/17     PT LONG TERM GOAL #3   Title Pt will ambulate with an improved gait pattern, with less loss of balance and least restrictive A device.   Baseline Pt currently uses no A device, and has a shuffling gait pattern with some loss of balance during ambulation with self correction and  tends to ambulate towards the left   Time 6   Period Weeks   Status New   Target Date 06/12/17     PT LONG TERM GOAL #4   Title Pt will report R knee pain is 1-2/10 at worst, and report less stiffness allowing her to ambulate with less pain and difficulty.   Baseline Currently c/o stiffness and pain with standing and ambulation   Time 6   Period Weeks   Target Date 06/12/17                Plan - 05/01/17 1230    Clinical Impression Statement Ms. Headrick has decreased R UE, trunk and LE strength, as well as thoracic pain from scoliosis and R knee pain due to arthritis and possible R lateral meniscal involvement.  She also has poor standing and dynamic balance with hx of a recent fall.  She should respond well to skilled PT intervention to promote UE, LE, and trunk strength and flexibility, thereby allowing her to perform household chores and ambulate longer distances without pain.  She should aslo respond well to balance training to prevent future falls.   Clinical Presentation Stable   Clinical Decision Making Moderate   Rehab Potential Fair   Clinical Impairments Affecting Rehab Potential scoliosis, long hx of back and knee pain   PT Frequency 2x / week   PT Duration 6 weeks   PT Treatment/Interventions Moist Heat;Gait training;Therapeutic activities;Therapeutic exercise;Balance training;Neuromuscular re-education;Patient/family education   PT Next Visit Plan Review home exercises and advance them as tolerated; balance training   PT Home Exercise Plan Thoracic stretches in sitting; Rows with RTB; SAQ with 2# wt; Tandem stance and SLS at kitchen sink   Consulted and Agree with Plan of Care Patient      Patient will benefit from skilled therapeutic intervention in order to improve the following deficits and impairments:  Abnormal gait, Decreased activity tolerance, Decreased balance, Decreased endurance, Difficulty walking, Impaired flexibility, Postural dysfunction, Decreased  range of motion, Pain  Visit Diagnosis: Difficulty walking - Plan: PT plan of care cert/re-cert  Chronic right-sided thoracic back pain - Plan: PT plan of care cert/re-cert  Chronic pain of right knee - Plan: PT plan of care cert/re-cert      G-Codes - 62/13/08 1246    Functional Assessment Tool Used (Outpatient Only) Berg Balance Scale   Functional Limitation Mobility: Walking and moving around   Mobility: Walking and Moving Around Current Status [phone]) At least 60 percent but less than 80 percent impaired, limited or restricted   Mobility: Walking and Moving Around Discharge Status [phone]) At least 1 percent but less than 20 percent impaired, limited or restricted       Problem List Patient Active Problem List   Diagnosis Date Noted  . Scoliosis 04/06/2017  . Fall 04/06/2017  .  Breast pain, left 04/06/2017  . Macular degeneration 04/06/2017  . Arthritis of left ankle 12/05/2016  . Atrophic vaginitis 12/05/2016  . Dysuria 09/20/2016  . Encounter for general adult medical examination with abnormal findings 09/04/2016  . Pedal edema 09/04/2016  . Dysphagia 09/04/2016  . Chronic pain of right knee 09/04/2016  . BPPV (benign paroxysmal positional vertigo) 12/24/2015  . Abdominal pain 08/03/2015  . Hyperlipidemia 07/27/2015  . Benign essential HTN 07/27/2015  . Rash and nonspecific skin eruption 07/27/2015  . Family history of colon cancer 12/18/2014  . Chronic diarrhea 12/17/2014  . Gastroesophageal hernia 05/22/2013  . CAD in native artery 09/06/2012  . Acid reflux 09/06/2012  . Adjustment disorder with anxiety 12/27/2006    Beautifull Cisar, MPT 05/01/2017, 1:05 PM  [facility] [address], [place], [address] Phone: [phone]   Fax:  [phone]  Name: Lindsay Tran MRN: [id number] Date of Birth: 07-Aug-1945

## 2017-05-01 NOTE — Patient Instructions (Signed)
Pt instructed in HEP of the following therapeutic ex's:  Seated mid-thoracic stretch reaching forward with hands clasped 5 x 20 sec 2-3x/day; Thoracic forward bend stretch in sitting 5 x 20 sec 2-3x/day; Red tband rows 2x10 2-3 x/day; short arc quads with 2# ankle wt 2x10 2x/day; tandem stance and single leg stance at kitchen sink 2x/day for NMR training.  Pt demonstrated her ability to do these there ex's today during session, and was given hand outs with pictures and written instructions.

## 2017-05-01 NOTE — Therapy (Deleted)
[place] [facility] [address], [place], [address] Phone: [phone]   Fax:  [phone]  Physical Therapy Evaluation  Patient Details  Name: Lindsay Tran MRN: [id number] Date of Birth: Feb 12, 1946 Referring Provider: Dr. Caryl Bis  Encounter Date: 05/01/2017    Past Medical History:  Diagnosis Date  . Abdominal pain   . Anemia   . Anxiety    adjustment disorder with anxiety  . Arthritis   . BPPV (benign paroxysmal positional vertigo)   . Cataract   . Chronic diarrhea   . Complication of anesthesia   . Coronary artery disease   . Dysphasia   . Gastric ulcer 1980's  . GERD (gastroesophageal reflux disease)   . High cholesterol   . Hypertension   . Osteoporosis   . Schatzki's ring 05/02/2013  . Scoliosis     Past Surgical History:  Procedure Laterality Date  . ABDOMINAL HYSTERECTOMY    . CARDIAC CATHETERIZATION    . CHOLECYSTECTOMY    . COLONOSCOPY    . COLONOSCOPY N/A 11/12/2014   Procedure: COLONOSCOPY;  Surgeon: Manya Silvas, MD;  Location: [facility] ENDOSCOPY;  Service: Endoscopy;  Laterality: N/A;  . DIAGNOSTIC LAPAROSCOPY     paraesphogeal hernia  . ESOPHAGOGASTRODUODENOSCOPY    . ESOPHAGOGASTRODUODENOSCOPY (EGD) WITH PROPOFOL N/A 04/16/2017   Procedure: ESOPHAGOGASTRODUODENOSCOPY (EGD) WITH PROPOFOL;  Surgeon: Manya Silvas, MD;  Location: [facility] ENDOSCOPY;  Service: Endoscopy;  Laterality: N/A;  . HERNIA REPAIR     inguinal hernia    There were no vitals filed for this visit.       Subjective Assessment - 05/01/17 1144    Subjective Lindsay Tran is a 71 y.o. female with long hx of back pain due to scoliosis, and a long hx of R knee pain and stiffness per pt report.  She also reports she had a fall about 4 weeks ago where she just lost her footing and bruised her face.  She states she loses her balance often and finds that she ambulates towards the left unconciously.  She states that  despite frequent losses of balance she has only fallen once in the past 6 months.  She recently saw Dr. Caryl Bis who referred her for PT to address her back pain from scoliosis, her R knee pain, and her poor balance (see order).  She states that when she is standing for longer periods and using her UE's to do household chores her upper back begins to have a burning, stabbing pain in the R scapular area and she also gets discomfort in her R lumbar region.  She states that sitting or lying down eases the pain.  She c/o R knee stiffness with ambulation and tightness/discomfort in the R lateral knee. This is worsened with ambulation.  She states she had "lubricating injections" for her R knee a few months ago and that did help her knee pain.   Pertinent History Hx of scoliosis and B knee arthritis; Hx of recent fall   Limitations Standing;Walking;House hold activities   How long can you stand comfortably? 5 minutes   How long can you walk comfortably? 5 minutes   Patient Stated Goals To be able to do household chores and stand longer periods with less pain and less loss of balance.   Currently in Pain? Yes   Pain Score 3    Pain Location Thoracic   Pain Orientation Right   Pain Descriptors / Indicators Stabbing   Pain Type Chronic  pain   Pain Onset More than a month ago   Pain Frequency Several days a week   Aggravating Factors  Prolonged standing, prolonged ambulation, household chores   Pain Relieving Factors sitting, lying down   Effect of Pain on Daily Activities Pt reports the pain causes her to have to stop whatever household activitiy she is doing and she must sit or lie down.   Multiple Pain Sites Yes   Pain Location Knee            [facility] PT Assessment - 05/01/17 0001      Assessment   Medical Diagnosis scoliosis, R knee pain, balance dysfunction   Referring Provider Dr. Caryl Bis   Onset Date/Surgical Date 07/10/16   Hand Dominance Right   Prior Therapy Hx of PT for knee pain      Balance Screen   Has the patient fallen in the past 6 months Yes   How many times? 1   Has the patient had a decrease in activity level because of a fear of falling?  No   Is the patient reluctant to leave their home because of a fear of falling?  No     Home Ec[employer] residence     Prior Function   Level of Independence Independent   Vocation Retired     Associate Professor   Overall Cognitive Status Within Functional Limits for tasks assessed     Observation/Other Assessments   Observations --  Pt has pronounced scoliosis curving to the R in scapular reg     Posture/Postural Control   Posture Comments Pt has forward head, rounded shoulders, and pronounced scoliosis in the thoracic region     ROM / Strength   AROM / PROM / Strength AROM  P     AROM   Overall AROM  Within functional limits for tasks performed     Palpation   Spinal mobility Decreased moderately due to scoliosis   Palpation comment No tenderness elicited with palpation at spine; but pt jumped with severe pain with palpation at R tibiofemoral joint line at R knee     Bed Mobility   Bed Mobility --  all WNL; pt log rolls and uses UE's appropriately     Transfers   Five time sit to stand comments  --  decreased speed and fatigues with reps     Ambulation/Gait   Ambulation/Gait Yes   Gait Comments Pt has B genu valgus and ambulates with shuffling gait pattern and poor dynamic balance.     Balance   Balance Assessed Yes     Static Standing Balance   Static Standing - Level of Assistance 4: Min assist   Static Standing Balance -  Activities  Single Leg Stance - Right Leg;Single Leg Stance - Left Leg     Standardized Balance Assessment   Standardized Balance Assessment Berg Balance Test            Objective measurements completed on examination: See above findings.                 Objectives  There-ex            PT Long Term Goals -  10/26/16 1650      PT LONG TERM GOAL #1   Title Patient will be able to perform sit to stand x 5 in under 15 seconds without use of her hands to demonstrate improved LE strength.    Baseline 15 seconds without  use of hands (did increase her pain)    Time 8   Period Weeks   Status Achieved     PT LONG TERM GOAL #2   Title Patient will report LEFS score of greater than 40/80 to demonstrate improved tolerance for ADLs.    Baseline 29/80 -- 37/80 on 4/19   Time 8   Period Weeks   Status Partially Met     PT LONG TERM GOAL #3   Title Patient will walk for at least 30 minutes with no increase in knee pain to demonstrate improved tolerance for community mobility.    Baseline Pain with prolonged walking.    Time 8   Period Weeks   Status Achieved              Patient will benefit from skilled therapeutic intervention in order to improve the following deficits and impairments:     Visit Diagnosis: No diagnosis found.     Problem List Patient Active Problem List   Diagnosis Date Noted  . Scoliosis 04/06/2017  . Fall 04/06/2017  . Breast pain, left 04/06/2017  . Macular degeneration 04/06/2017  . Arthritis of left ankle 12/05/2016  . Atrophic vaginitis 12/05/2016  . Dysuria 09/20/2016  . Encounter for general adult medical examination with abnormal findings 09/04/2016  . Pedal edema 09/04/2016  . Dysphagia 09/04/2016  . Chronic pain of right knee 09/04/2016  . BPPV (benign paroxysmal positional vertigo) 12/24/2015  . Abdominal pain 08/03/2015  . Hyperlipidemia 07/27/2015  . Benign essential HTN 07/27/2015  . Rash and nonspecific skin eruption 07/27/2015  . Family history of colon cancer 12/18/2014  . Chronic diarrhea 12/17/2014  . Gastroesophageal hernia 05/22/2013  . CAD in native artery 09/06/2012  . Acid reflux 09/06/2012  . Adjustment disorder with anxiety 12/27/2006    Jaynee Winters 05/01/2017, 12:07 PM  [place]  [facility] [address], [place], [address] Phone: [phone]   Fax:  [phone]  Name: GITA DILGER MRN: [id number] Date of Birth: [birth year]

## 2017-05-04 ENCOUNTER — Ambulatory Visit: Payer: PPO

## 2017-05-04 DIAGNOSIS — G8929 Other chronic pain: Secondary | ICD-10-CM

## 2017-05-04 DIAGNOSIS — R262 Difficulty in walking, not elsewhere classified: Secondary | ICD-10-CM | POA: Diagnosis not present

## 2017-05-04 DIAGNOSIS — M546 Pain in thoracic spine: Principal | ICD-10-CM

## 2017-05-04 DIAGNOSIS — M25561 Pain in right knee: Secondary | ICD-10-CM

## 2017-05-04 NOTE — Therapy (Signed)
[place] [facility] [address], [place], [address] Phone: [phone]   Fax:  [phone]  Physical Therapy Treatment  Patient Details  Name: Lindsay Tran MRN: [id number] Date of Birth: [birth year] Referring Provider: Dr. Caryl Bis  Encounter Date: 05/04/2017      PT End of Session - 05/04/17 0923    Visit Number 2   Number of Visits 12   Date for PT Re-Evaluation 06/15/17   Authorization Type 2   Authorization Time Period of 10 g-code   PT Start Time 0920   PT Stop Time 1004   PT Time Calculation (min) 44 min   Activity Tolerance Patient tolerated treatment well   Behavior During Therapy [facility] for tasks assessed/performed      Past Medical History:  Diagnosis Date  . Abdominal pain   . Anemia   . Anxiety    adjustment disorder with anxiety  . Arthritis   . BPPV (benign paroxysmal positional vertigo)   . Cataract   . Chronic diarrhea   . Complication of anesthesia   . Coronary artery disease   . Dysphasia   . Gastric ulcer 1980's  . GERD (gastroesophageal reflux disease)   . High cholesterol   . Hypertension   . Osteoporosis   . Schatzki's ring 05/02/2013  . Scoliosis     Past Surgical History:  Procedure Laterality Date  . ABDOMINAL HYSTERECTOMY    . CARDIAC CATHETERIZATION    . CHOLECYSTECTOMY    . COLONOSCOPY    . COLONOSCOPY N/A 11/12/2014   Procedure: COLONOSCOPY;  Surgeon: Manya Silvas, MD;  Location: [facility] ENDOSCOPY;  Service: Endoscopy;  Laterality: N/A;  . DIAGNOSTIC LAPAROSCOPY     paraesphogeal hernia  . ESOPHAGOGASTRODUODENOSCOPY    . ESOPHAGOGASTRODUODENOSCOPY (EGD) WITH PROPOFOL N/A 04/16/2017   Procedure: ESOPHAGOGASTRODUODENOSCOPY (EGD) WITH PROPOFOL;  Surgeon: Manya Silvas, MD;  Location: [facility] ENDOSCOPY;  Service: Endoscopy;  Laterality: N/A;  . HERNIA REPAIR     inguinal hernia    There were no vitals filed for this visit.      Subjective Assessment -  05/04/17 0922    Subjective Pt reports she is doing well today. She denies any back pain currently. She reports 4/10 R knee pain currently. No specific questions or concerns currently.    Pertinent History Hx of scoliosis and B knee arthritis; Hx of recent fall   Limitations Standing;Walking;House hold activities   How long can you stand comfortably? 5 minutes   How long can you walk comfortably? 5 minutes   Patient Stated Goals To be able to do household chores and stand longer periods with less pain and less loss of balance.   Currently in Pain? Yes   Pain Score 4    Pain Location Knee   Pain Orientation Right   Pain Descriptors / Indicators Aching   Pain Type Chronic pain   Pain Onset More than a month ago   Pain Frequency Constant   Multiple Pain Sites No           TREATMENT  Ther-ex  NuStep L1 x 5 minutes for warm-up (unbilled); Seated thoracic stretches with fingers clasped and shoulder protracted 20s hold x 3; Seated lumbar stretches with pball roll outs forward x 10, left x 10, and right x 10; Standing low rows with RTB 2 x 10, extensive cues for correct form/posture; Seated repeated thoracic extension with scapulae protracted and towel roll around T7 with therapist guiding movement and  providing overpressure 2 x 10; Supine positioning over towel roll at T7 with R pec stretch by therapist for clavicular and sternal portions 20s hold x 2 each; Hooklying R SLR 2 x 10; R SAQ with manual resistance 2 x 10; Hooklying clams with manual resistance 2 x 10; Hooklying adductor squeezes with manual resistance 2 x 10; Hooklying bridges 2 x 10; Sit to stand without UE support from mat table 2 x 10 Reviewed tandem and single leg balance with patient;                         PT Education - 05/04/17 0923    Education provided Yes   Education Details Exercise form/technique   Person(s) Educated Patient   Methods Explanation   Comprehension Verbalized  understanding          PT Short Term Goals - 05/01/17 1236      PT SHORT TERM GOAL #1   Title Pt will be educated in and I with a new home exercise program to improve overall strength, flexibility, and ROM, as well as improve balance.   Baseline no exercise home program   Time 1   Period Weeks   Status New   Target Date 05/08/17           PT Long Term Goals - 05/01/17 1237      PT LONG TERM GOAL #1   Title Pt will have an improved Berg Balance score by at least 30% indicating less fall risk.   Baseline 45 at intial PT eval   Time 6   Period Weeks   Status New   Target Date 06/12/17     PT LONG TERM GOAL #2   Title Pt will report being able to perform all household chores with less pain and difficulty due to improved strength, flexibility, and stamina.   Baseline Pt cannot tolerate standing longer than 5 minutes to do most household chores due to back pain   Time 6   Period Weeks   Status New   Target Date 06/12/17     PT LONG TERM GOAL #3   Title Pt will ambulate with an improved gait pattern, with less loss of balance and least restrictive A device.   Baseline Pt currently uses no A device, and has a shuffling gait pattern with some loss of balance during ambulation with self correction and tends to ambulate towards the left   Time 6   Period Weeks   Status New   Target Date 06/12/17     PT LONG TERM GOAL #4   Title Pt will report R knee pain is 1-2/10 at worst, and report less stiffness allowing her to ambulate with less pain and difficulty.   Baseline Currently c/o stiffness and pain with standing and ambulation   Time 6   Period Weeks   Target Date 06/12/17               Plan - 05/04/17 7510    Clinical Impression Statement Pt reports constant R knee pain but no worse at the end of session compared to the start. She has a significant R knee valgus in unweighted and weighted positions. She reports some relief from thoracic pain following seated  thoracic extension over back of chair. Pain is localized on medial border of scapulae near inferior angle. Pt encouraged to continue current HEP and follow-up as scheduled.    Rehab Potential Fair   Clinical Impairments  Affecting Rehab Potential scoliosis, long hx of back and knee pain   PT Frequency 2x / week   PT Duration 6 weeks   PT Treatment/Interventions Moist Heat;Gait training;Therapeutic activities;Therapeutic exercise;Balance training;Neuromuscular re-education;Patient/family education   PT Next Visit Plan Review home exercises and advance them as tolerated; balance training   PT Home Exercise Plan Thoracic stretches in sitting; Rows with RTB; SAQ with 2# wt; Tandem stance and SLS at kitchen sink   Consulted and Agree with Plan of Care Patient      Patient will benefit from skilled therapeutic intervention in order to improve the following deficits and impairments:  Abnormal gait, Decreased activity tolerance, Decreased balance, Decreased endurance, Difficulty walking, Impaired flexibility, Postural dysfunction, Decreased range of motion, Pain  Visit Diagnosis: Chronic right-sided thoracic back pain  Chronic pain of right knee     Problem List Patient Active Problem List   Diagnosis Date Noted  . Scoliosis 04/06/2017  . Fall 04/06/2017  . Breast pain, left 04/06/2017  . Macular degeneration 04/06/2017  . Arthritis of left ankle 12/05/2016  . Atrophic vaginitis 12/05/2016  . Dysuria 09/20/2016  . Encounter for general adult medical examination with abnormal findings 09/04/2016  . Pedal edema 09/04/2016  . Dysphagia 09/04/2016  . Chronic pain of right knee 09/04/2016  . BPPV (benign paroxysmal positional vertigo) 12/24/2015  . Abdominal pain 08/03/2015  . Hyperlipidemia 07/27/2015  . Benign essential HTN 07/27/2015  . Rash and nonspecific skin eruption 07/27/2015  . Family history of colon cancer 12/18/2014  . Chronic diarrhea 12/17/2014  . Gastroesophageal hernia  05/22/2013  . CAD in native artery 09/06/2012  . Acid reflux 09/06/2012  . Adjustment disorder with anxiety 12/27/2006   Phillips Grout PT, DPT   Kaedance Magos 05/04/2017, 11:31 AM  [place] [facility] [address], [place], [address] Phone: [phone]   Fax:  [phone]  Name: Lindsay Tran MRN: [id number] Date of Birth: [birth year]

## 2017-05-08 ENCOUNTER — Ambulatory Visit: Payer: PPO | Admitting: Physical Therapy

## 2017-05-08 DIAGNOSIS — M25562 Pain in left knee: Secondary | ICD-10-CM

## 2017-05-08 DIAGNOSIS — R262 Difficulty in walking, not elsewhere classified: Secondary | ICD-10-CM | POA: Diagnosis not present

## 2017-05-08 DIAGNOSIS — M25561 Pain in right knee: Secondary | ICD-10-CM

## 2017-05-08 DIAGNOSIS — G8929 Other chronic pain: Secondary | ICD-10-CM

## 2017-05-08 DIAGNOSIS — M546 Pain in thoracic spine: Principal | ICD-10-CM

## 2017-05-08 NOTE — Therapy (Signed)
[place] [facility] [address] [place], [address] Phone: [phone]   Fax:  [phone]  Physical Therapy Treatment  Patient Details  Name: Lindsay Tran MRN: [id number] Date of Birth: [birth year] Referring Provider: Dr. Caryl Bis  Encounter Date: 05/08/2017      PT End of Session - 05/08/17 1124    Visit Number 3   Number of Visits 12   Date for PT Re-Evaluation 06/15/17   Authorization Type 3   Authorization Time Period of 10 g-code   PT Start Time 1035   PT Stop Time 1115   PT Time Calculation (min) 40 min   Activity Tolerance Patient tolerated treatment well   Behavior During Therapy [facility] for tasks assessed/performed      Past Medical History:  Diagnosis Date  . Abdominal pain   . Anemia   . Anxiety    adjustment disorder with anxiety  . Arthritis   . BPPV (benign paroxysmal positional vertigo)   . Cataract   . Chronic diarrhea   . Complication of anesthesia   . Coronary artery disease   . Dysphasia   . Gastric ulcer 1980's  . GERD (gastroesophageal reflux disease)   . High cholesterol   . Hypertension   . Osteoporosis   . Schatzki's ring 05/02/2013  . Scoliosis     Past Surgical History:  Procedure Laterality Date  . ABDOMINAL HYSTERECTOMY    . CARDIAC CATHETERIZATION    . CHOLECYSTECTOMY    . COLONOSCOPY    . COLONOSCOPY N/A 11/12/2014   Procedure: COLONOSCOPY;  Surgeon: Manya Silvas, MD;  Location: [facility] ENDOSCOPY;  Service: Endoscopy;  Laterality: N/A;  . DIAGNOSTIC LAPAROSCOPY     paraesphogeal hernia  . ESOPHAGOGASTRODUODENOSCOPY    . ESOPHAGOGASTRODUODENOSCOPY (EGD) WITH PROPOFOL N/A 04/16/2017   Procedure: ESOPHAGOGASTRODUODENOSCOPY (EGD) WITH PROPOFOL;  Surgeon: Manya Silvas, MD;  Location: [facility] ENDOSCOPY;  Service: Endoscopy;  Laterality: N/A;  . HERNIA REPAIR     inguinal hernia    There were no vitals filed for this visit.      Subjective Assessment -  05/08/17 1037    Subjective Patient reports her knee began hurting a few months ago after some yard work, she does not recall any specific injury. She reports she has had some pain while sleeping, which is relieved by moving her knee. Reports she is no longer on aleve due to stomach issues. She has the most pain after not moving and getting up to    Pertinent History Hx of scoliosis and B knee arthritis; Hx of recent fall   Limitations Standing;Walking;House hold activities   How long can you stand comfortably? 5 minutes   How long can you walk comfortably? 5 minutes   Patient Stated Goals To be able to do household chores and stand longer periods with less pain and less loss of balance.   Currently in Pain? Yes   Pain Onset More than a month ago      Gait analysis - decreased loading response noted on R, decreased knee flexion during swing, appropriate hip extension and DF noted during gait on R. On L decreased DF ROM noted. Trendelenburg noted on R>L.   Thessaly -- negative with knee extended and mildly flexed   Pain with long arc quad   Palpation reported pain over distal IT band and lateral joint line area   Ely's test - flexibility to 115 degrees on R knee, 130 degrees on L knee  Joint mobilization testing - no pain reported throughout spine.   Knee flexion A-P joint mobilizations 3 bouts x 45" per bout. Well tolerated grade II mobilizations   Lateral and medial patellar mobilizations painful, mild discomfort with superior but not inferior glides. Performed bouts of 30" each x 5 bouts medial and lateral.   Sidelying clamshells with red t-band x15 bilaterally (too easy) added green t-band x 10 which was more appropriately challenging   Supine bridging x 8 repetitions per bout x 2 bouts (appropriate activation noted)                             PT Education - 05/08/17 1123    Education provided Yes   Education Details Provided education on rationale for  knee extension ROM need, hip strengthening.    Person(s) Educated Patient   Methods Explanation;Demonstration;Handout   Comprehension Verbalized understanding;Returned demonstration          PT Short Term Goals - 05/01/17 1236      PT SHORT TERM GOAL #1   Title Pt will be educated in and I with a new home exercise program to improve overall strength, flexibility, and ROM, as well as improve balance.   Baseline no exercise home program   Time 1   Period Weeks   Status New   Target Date 05/08/17           PT Long Term Goals - 05/01/17 1237      PT LONG TERM GOAL #1   Title Pt will have an improved Berg Balance score by at least 30% indicating less fall risk.   Baseline 45 at intial PT eval   Time 6   Period Weeks   Status New   Target Date 06/12/17     PT LONG TERM GOAL #2   Title Pt will report being able to perform all household chores with less pain and difficulty due to improved strength, flexibility, and stamina.   Baseline Pt cannot tolerate standing longer than 5 minutes to do most household chores due to back pain   Time 6   Period Weeks   Status New   Target Date 06/12/17     PT LONG TERM GOAL #3   Title Pt will ambulate with an improved gait pattern, with less loss of balance and least restrictive A device.   Baseline Pt currently uses no A device, and has a shuffling gait pattern with some loss of balance during ambulation with self correction and tends to ambulate towards the left   Time 6   Period Weeks   Status New   Target Date 06/12/17     PT LONG TERM GOAL #4   Title Pt will report R knee pain is 1-2/10 at worst, and report less stiffness allowing her to ambulate with less pain and difficulty.   Baseline Currently c/o stiffness and pain with standing and ambulation   Time 6   Period Weeks   Target Date 06/12/17               Plan - 05/08/17 1124    Clinical Impression Statement Patient demonstrates loss of knee extension and 15 degree  difference between quad flexibility on her RLE relative to LLE in Ely's position which is significantly associated with anterior knee pain. She is also reporting some discomfort with patellar mobilizations, which she would likely benefit from some dedicated treatment on as well. She tolerated gluteal strengthening  well today, and was provided with this for her HEP.    Clinical Presentation Stable   Clinical Decision Making Moderate   Rehab Potential Fair   Clinical Impairments Affecting Rehab Potential scoliosis, long hx of back and knee pain   PT Frequency 2x / week   PT Duration 6 weeks   PT Treatment/Interventions Moist Heat;Gait training;Therapeutic activities;Therapeutic exercise;Balance training;Neuromuscular re-education;Patient/family education   PT Next Visit Plan Review home exercises and advance them as tolerated; balance training   PT Home Exercise Plan Thoracic stretches in sitting; Rows with RTB; SAQ with 2# wt; Tandem stance and SLS at kitchen sink   Consulted and Agree with Plan of Care Patient      Patient will benefit from skilled therapeutic intervention in order to improve the following deficits and impairments:  Abnormal gait, Decreased activity tolerance, Decreased balance, Decreased endurance, Difficulty walking, Impaired flexibility, Postural dysfunction, Decreased range of motion, Pain  Visit Diagnosis: Chronic right-sided thoracic back pain  Chronic pain of right knee  Difficulty walking  Chronic pain of left knee     Problem List Patient Active Problem List   Diagnosis Date Noted  . Scoliosis 04/06/2017  . Fall 04/06/2017  . Breast pain, left 04/06/2017  . Macular degeneration 04/06/2017  . Arthritis of left ankle 12/05/2016  . Atrophic vaginitis 12/05/2016  . Dysuria 09/20/2016  . Encounter for general adult medical examination with abnormal findings 09/04/2016  . Pedal edema 09/04/2016  . Dysphagia 09/04/2016  . Chronic pain of right knee  09/04/2016  . BPPV (benign paroxysmal positional vertigo) 12/24/2015  . Abdominal pain 08/03/2015  . Hyperlipidemia 07/27/2015  . Benign essential HTN 07/27/2015  . Rash and nonspecific skin eruption 07/27/2015  . Family history of colon cancer 12/18/2014  . Chronic diarrhea 12/17/2014  . Gastroesophageal hernia 05/22/2013  . CAD in native artery 09/06/2012  . Acid reflux 09/06/2012  . Adjustment disorder with anxiety 12/27/2006   Royce Macadamia PT, DPT, CSCS    05/08/2017, 12:37 PM  [facility] [address] [place], [address] Phone: [phone]   Fax:  [phone]  Name: Lindsay Tran MRN: [id number] Date of Birth: [birth year]

## 2017-05-08 NOTE — Patient Instructions (Addendum)
Gait analysis  Thessaly -- negative with knee extended and mildly flexed   Pain with long arc quad   Palpation reported pain over distal IT band and lateral joint line area   Ely's test - flexibility to 115 degrees on R knee, 130 degrees on L knee   Joint mobilization testing - no pain reported throughout spine.   Knee flexion A-P joint mobilizations   Lateral and medial patellar mobilizations painful, mild discomfort with superior but not inferior glides.   Sidelying clamshells   Supine bridging

## 2017-05-10 ENCOUNTER — Ambulatory Visit: Payer: PPO | Attending: Family Medicine | Admitting: Physical Therapy

## 2017-05-10 DIAGNOSIS — G8929 Other chronic pain: Secondary | ICD-10-CM | POA: Insufficient documentation

## 2017-05-10 DIAGNOSIS — R262 Difficulty in walking, not elsewhere classified: Secondary | ICD-10-CM | POA: Insufficient documentation

## 2017-05-10 DIAGNOSIS — M546 Pain in thoracic spine: Secondary | ICD-10-CM | POA: Diagnosis not present

## 2017-05-10 DIAGNOSIS — M25562 Pain in left knee: Secondary | ICD-10-CM | POA: Diagnosis not present

## 2017-05-10 DIAGNOSIS — M25561 Pain in right knee: Secondary | ICD-10-CM | POA: Diagnosis not present

## 2017-05-10 NOTE — Patient Instructions (Signed)
Calf DF mobilizations on RLE grade IV x 5 bouts   Palpation and STM over adductor distal musculature   Medial patellar mobilizations were painful thus held for 15" for 5 bouts   Standing calf stretching at TM x 30"

## 2017-05-10 NOTE — Therapy (Signed)
[place] [facility] [address], [place], [address] Phone: [phone]   Fax:  [phone]  Physical Therapy Treatment  Patient Details  Name: Lindsay Tran MRN: [id number] Date of Birth: [birth year] Referring Provider: Dr. Caryl Bis  Encounter Date: 05/10/2017      PT End of Session - 05/10/17 1258    Visit Number 4   Number of Visits 12   Date for PT Re-Evaluation 06/15/17   Authorization Type 4   Authorization Time Period of 10 g-code   PT Start Time 1041   PT Stop Time 1121   PT Time Calculation (min) 40 min   Activity Tolerance Patient tolerated treatment well   Behavior During Therapy [facility] for tasks assessed/performed      Past Medical History:  Diagnosis Date  . Abdominal pain   . Anemia   . Anxiety    adjustment disorder with anxiety  . Arthritis   . BPPV (benign paroxysmal positional vertigo)   . Cataract   . Chronic diarrhea   . Complication of anesthesia   . Coronary artery disease   . Dysphasia   . Gastric ulcer 1980's  . GERD (gastroesophageal reflux disease)   . High cholesterol   . Hypertension   . Osteoporosis   . Schatzki's ring 05/02/2013  . Scoliosis     Past Surgical History:  Procedure Laterality Date  . ABDOMINAL HYSTERECTOMY    . CARDIAC CATHETERIZATION    . CHOLECYSTECTOMY    . COLONOSCOPY    . COLONOSCOPY N/A 11/12/2014   Procedure: COLONOSCOPY;  Surgeon: Manya Silvas, MD;  Location: [facility] ENDOSCOPY;  Service: Endoscopy;  Laterality: N/A;  . DIAGNOSTIC LAPAROSCOPY     paraesphogeal hernia  . ESOPHAGOGASTRODUODENOSCOPY    . ESOPHAGOGASTRODUODENOSCOPY (EGD) WITH PROPOFOL N/A 04/16/2017   Procedure: ESOPHAGOGASTRODUODENOSCOPY (EGD) WITH PROPOFOL;  Surgeon: Manya Silvas, MD;  Location: [facility] ENDOSCOPY;  Service: Endoscopy;  Laterality: N/A;  . HERNIA REPAIR     inguinal hernia    There were no vitals filed for this visit.      Subjective Assessment -  05/10/17 1041    Subjective Patient reports she was exercising and working in her yard yesterday and was having knee pain when she went to bed. However this morning she woke up with no pain and slept well, which is a change for her.    Pertinent History Hx of scoliosis and B knee arthritis; Hx of recent fall   Limitations Standing;Walking;House hold activities   How long can you stand comfortably? 5 minutes   How long can you walk comfortably? 5 minutes   Diagnostic tests X ray showing moderate arthritis (tri-compartmental).    Patient Stated Goals To be able to do household chores and stand longer periods with less pain and less loss of balance.   Currently in Pain? Yes   Pain Score --  Mild to moderate discomfort in her lateral R knee walking in.    Pain Location Knee   Pain Orientation Right   Pain Descriptors / Indicators Aching   Pain Type Chronic pain   Pain Onset More than a month ago   Pain Frequency Intermittent       Calf DF mobilizations on RLE grade IV x 5 bouts x 45-60" per bout, no discomfort reported, feeling of stretching in calf appropriately   Palpation and STM over adductor distal musculature -- patient reported pain consistent with her complaint upon palpation of distal adductor musculature,  reduced with prolonged STM and exposure to pressure.   Medial patellar mobilizations were painful thus held for 15" for 5 bouts -- reduced in discomfort with repeated bouts   Standing calf stretching at TM x 30" x 3 bouts to educate on HEP bilaterally   Observed single leg stance -- notable for severe loss of medial arch on L >R, followed up with gait observation - notable for premature toe off on LLE secondary to poor DF ROM and decreased great toe extension. Severe pronation of midfoot noted at midstance of LLE. On RLE similar mechanics noted though to a lesser degree. In posterior view, significant for toe out/LE ER secondary to decreased DF ROM, which will place more  demand/rotational torque about the medial knee joint.                           PT Education - 05/10/17 1257    Education provided Yes   Education Details Educated patient that calf stretching was vital to reducing her symptoms given her severe pronation and loss of arch. Referred to orthotics specialist.    Person(s) Educated Patient   Methods Explanation;Demonstration;Handout   Comprehension Verbalized understanding;Returned demonstration          PT Short Term Goals - 05/01/17 1236      PT SHORT TERM GOAL #1   Title Pt will be educated in and I with a new home exercise program to improve overall strength, flexibility, and ROM, as well as improve balance.   Baseline no exercise home program   Time 1   Period Weeks   Status New   Target Date 05/08/17           PT Long Term Goals - 05/01/17 1237      PT LONG TERM GOAL #1   Title Pt will have an improved Berg Balance score by at least 30% indicating less fall risk.   Baseline 45 at intial PT eval   Time 6   Period Weeks   Status New   Target Date 06/12/17     PT LONG TERM GOAL #2   Title Pt will report being able to perform all household chores with less pain and difficulty due to improved strength, flexibility, and stamina.   Baseline Pt cannot tolerate standing longer than 5 minutes to do most household chores due to back pain   Time 6   Period Weeks   Status New   Target Date 06/12/17     PT LONG TERM GOAL #3   Title Pt will ambulate with an improved gait pattern, with less loss of balance and least restrictive A device.   Baseline Pt currently uses no A device, and has a shuffling gait pattern with some loss of balance during ambulation with self correction and tends to ambulate towards the left   Time 6   Period Weeks   Status New   Target Date 06/12/17     PT LONG TERM GOAL #4   Title Pt will report R knee pain is 1-2/10 at worst, and report less stiffness allowing her to ambulate with  less pain and difficulty.   Baseline Currently c/o stiffness and pain with standing and ambulation   Time 6   Period Weeks   Target Date 06/12/17               Plan - 05/10/17 1258    Clinical Impression Statement Patient noted to have severe loss  of arch bilaterally, and in gait have severe loss of DF ROM which leads her to toe out and have severe pronation L>R. She was referred to an orthotics specialist to provide more arch support and heel lift given her presentation, this should be quite beneficial as she has fairly significant resting and dynamic valgus.    Clinical Presentation Stable   Clinical Decision Making Moderate   Rehab Potential Fair   Clinical Impairments Affecting Rehab Potential scoliosis, long hx of back and knee pain   PT Frequency 2x / week   PT Duration 6 weeks   PT Treatment/Interventions Moist Heat;Gait training;Therapeutic activities;Therapeutic exercise;Balance training;Neuromuscular re-education;Patient/family education   PT Next Visit Plan Review home exercises and advance them as tolerated; balance training   PT Home Exercise Plan Thoracic stretches in sitting; Rows with RTB; SAQ with 2# wt; Tandem stance and SLS at kitchen sink   Consulted and Agree with Plan of Care Patient      Patient will benefit from skilled therapeutic intervention in order to improve the following deficits and impairments:  Abnormal gait, Decreased activity tolerance, Decreased balance, Decreased endurance, Difficulty walking, Impaired flexibility, Postural dysfunction, Decreased range of motion, Pain  Visit Diagnosis: Chronic pain of right knee  Difficulty walking  Chronic pain of left knee     Problem List Patient Active Problem List   Diagnosis Date Noted  . Scoliosis 04/06/2017  . Fall 04/06/2017  . Breast pain, left 04/06/2017  . Macular degeneration 04/06/2017  . Arthritis of left ankle 12/05/2016  . Atrophic vaginitis 12/05/2016  . Dysuria 09/20/2016  .  Encounter for general adult medical examination with abnormal findings 09/04/2016  . Pedal edema 09/04/2016  . Dysphagia 09/04/2016  . Chronic pain of right knee 09/04/2016  . BPPV (benign paroxysmal positional vertigo) 12/24/2015  . Abdominal pain 08/03/2015  . Hyperlipidemia 07/27/2015  . Benign essential HTN 07/27/2015  . Rash and nonspecific skin eruption 07/27/2015  . Family history of colon cancer 12/18/2014  . Chronic diarrhea 12/17/2014  . Gastroesophageal hernia 05/22/2013  . CAD in native artery 09/06/2012  . Acid reflux 09/06/2012  . Adjustment disorder with anxiety 12/27/2006   Royce Macadamia PT, DPT, CSCS    05/10/2017, 1:01 PM  [place] [facility] [address] [place], [address] Phone: [phone]   Fax:  [phone]  Name: CORDELIA BESSINGER MRN: [id number] Date of Birth: Mar 07, 1946

## 2017-05-15 ENCOUNTER — Ambulatory Visit: Payer: PPO | Admitting: Physical Therapy

## 2017-05-15 DIAGNOSIS — M25561 Pain in right knee: Secondary | ICD-10-CM | POA: Diagnosis not present

## 2017-05-15 DIAGNOSIS — R262 Difficulty in walking, not elsewhere classified: Secondary | ICD-10-CM

## 2017-05-15 DIAGNOSIS — M25562 Pain in left knee: Secondary | ICD-10-CM

## 2017-05-15 DIAGNOSIS — G8929 Other chronic pain: Secondary | ICD-10-CM

## 2017-05-16 NOTE — Therapy (Signed)
[place] [facility] [address] [place], [address] Phone: [phone]   Fax:  [phone]  Physical Therapy Treatment  Patient Details  Name: Lindsay Tran MRN: [id number] Date of Birth: 1946-05-11 Referring Provider: Dr. Caryl Bis   Encounter Date: 05/15/2017  PT End of Session - 05/16/17 0953    Visit Number  5    Number of Visits  12    Date for PT Re-Evaluation  06/15/17    Authorization Type  5    Authorization Time Period  of 10 g-code    PT Start Time  1035    PT Stop Time  1115    PT Time Calculation (min)  40 min    Activity Tolerance  Patient tolerated treatment well    Behavior During Therapy  [facility] for tasks assessed/performed       Past Medical History:  Diagnosis Date  . Abdominal pain   . Anemia   . Anxiety    adjustment disorder with anxiety  . Arthritis   . BPPV (benign paroxysmal positional vertigo)   . Cataract   . Chronic diarrhea   . Complication of anesthesia   . Coronary artery disease   . Dysphasia   . Gastric ulcer 1980's  . GERD (gastroesophageal reflux disease)   . High cholesterol   . Hypertension   . Osteoporosis   . Schatzki's ring 05/02/2013  . Scoliosis     Past Surgical History:  Procedure Laterality Date  . ABDOMINAL HYSTERECTOMY    . CARDIAC CATHETERIZATION    . CHOLECYSTECTOMY    . COLONOSCOPY    . DIAGNOSTIC LAPAROSCOPY     paraesphogeal hernia  . ESOPHAGOGASTRODUODENOSCOPY    . HERNIA REPAIR     inguinal hernia    There were no vitals filed for this visit.  Subjective Assessment - 05/15/17 1040    Subjective  Patient reports she was feeling really well after quad stretching last week. However after previous PT session which focused on calf stretching, adductor STM, and patellar mobs she was quite sore and sensitive for several days. It has improved, but still present, especially in distal adductor area.     Pertinent History  Hx of scoliosis and B  knee arthritis; Hx of recent fall    Limitations  Standing;Walking;House hold activities    How long can you stand comfortably?  5 minutes    How long can you walk comfortably?  5 minutes    Diagnostic tests  X ray showing moderate arthritis (tri-compartmental).     Patient Stated Goals  To be able to do household chores and stand longer periods with less pain and less loss of balance.    Currently in Pain?  Yes    Pain Location  Knee    Pain Orientation  Right    Pain Descriptors / Indicators  Aching    Pain Type  Chronic pain    Pain Onset  More than a month ago    Pain Frequency  Constant      Performed prone quadriceps stretching x 10 bouts for 2 sets for 30-60" per bout bilaterally with positive response (patient reported decreased pain afterwards with gait) -- Educated patient on performing prone quadricep stretch with belt at home.   Standing hip abductions on MATRIX 10# for 12 repetitions x 2 sets   Provided continuous high volt stimulation to adductor (distal) portion of R thigh at 85V with moist hot pack applied, patient  reported significant decrease in symptoms afterwards.                         PT Education - 05/16/17 0953    Education provided  Yes    Education Details  Will discontinue calf stretching for now, just focus on quad stretching.     Person(s) Educated  Patient    Methods  Explanation;Demonstration;Handout    Comprehension  Verbalized understanding;Returned demonstration       PT Short Term Goals - 05/01/17 1236      PT SHORT TERM GOAL #1   Title  Pt will be educated in and I with a new home exercise program to improve overall strength, flexibility, and ROM, as well as improve balance.    Baseline  no exercise home program    Time  1    Period  Weeks    Status  New    Target Date  05/08/17        PT Long Term Goals - 05/01/17 1237      PT LONG TERM GOAL #1   Title  Pt will have an improved Berg Balance score by at least 30%  indicating less fall risk.    Baseline  45 at intial PT eval    Time  6    Period  Weeks    Status  New    Target Date  06/12/17      PT LONG TERM GOAL #2   Title  Pt will report being able to perform all household chores with less pain and difficulty due to improved strength, flexibility, and stamina.    Baseline  Pt cannot tolerate standing longer than 5 minutes to do most household chores due to back pain    Time  6    Period  Weeks    Status  New    Target Date  06/12/17      PT LONG TERM GOAL #3   Title  Pt will ambulate with an improved gait pattern, with less loss of balance and least restrictive A device.    Baseline  Pt currently uses no A device, and has a shuffling gait pattern with some loss of balance during ambulation with self correction and tends to ambulate towards the left    Time  6    Period  Weeks    Status  New    Target Date  06/12/17      PT LONG TERM GOAL #4   Title  Pt will report R knee pain is 1-2/10 at worst, and report less stiffness allowing her to ambulate with less pain and difficulty.    Baseline  Currently c/o stiffness and pain with standing and ambulation    Time  6    Period  Weeks    Target Date  06/12/17            Plan - 05/16/17 0953    Clinical Impression Statement  Patient had poor response to calf stretching and calf DF mobilizations, thus opted for quad stretching this date given her history of positive improvements after completion. She noted positive response to quad stretching this date.     Clinical Presentation  Stable    Clinical Decision Making  Moderate    Rehab Potential  Fair    Clinical Impairments Affecting Rehab Potential  scoliosis, long hx of back and knee pain    PT Frequency  2x / week  PT Duration  6 weeks    PT Treatment/Interventions  Moist Heat;Gait training;Therapeutic activities;Therapeutic exercise;Balance training;Neuromuscular re-education;Patient/family education    PT Next Visit Plan  Review home  exercises and advance them as tolerated; balance training    PT Home Exercise Plan  Thoracic stretches in sitting; Rows with RTB; SAQ with 2# wt; Tandem stance and SLS at kitchen sink    Consulted and Agree with Plan of Care  Patient       Patient will benefit from skilled therapeutic intervention in order to improve the following deficits and impairments:  Abnormal gait, Decreased activity tolerance, Decreased balance, Decreased endurance, Difficulty walking, Impaired flexibility, Postural dysfunction, Decreased range of motion, Pain  Visit Diagnosis: Chronic pain of right knee  Difficulty walking  Chronic pain of left knee     Problem List Patient Active Problem List   Diagnosis Date Noted  . Scoliosis 04/06/2017  . Fall 04/06/2017  . Breast pain, left 04/06/2017  . Macular degeneration 04/06/2017  . Arthritis of left ankle 12/05/2016  . Atrophic vaginitis 12/05/2016  . Dysuria 09/20/2016  . Encounter for general adult medical examination with abnormal findings 09/04/2016  . Pedal edema 09/04/2016  . Dysphagia 09/04/2016  . Chronic pain of right knee 09/04/2016  . BPPV (benign paroxysmal positional vertigo) 12/24/2015  . Abdominal pain 08/03/2015  . Hyperlipidemia 07/27/2015  . Benign essential HTN 07/27/2015  . Rash and nonspecific skin eruption 07/27/2015  . Family history of colon cancer 12/18/2014  . Chronic diarrhea 12/17/2014  . Gastroesophageal hernia 05/22/2013  . CAD in native artery 09/06/2012  . Acid reflux 09/06/2012  . Adjustment disorder with anxiety 12/27/2006   Royce Macadamia PT, DPT, CSCS    05/16/2017, 9:56 AM  [place] [facility] [address], [place], [address] Phone: [phone]   Fax:  [phone]  Name: Lindsay Tran MRN: [id number] Date of Birth: [birth year]

## 2017-05-17 ENCOUNTER — Ambulatory Visit: Payer: PPO | Admitting: Physical Therapy

## 2017-05-17 DIAGNOSIS — M25562 Pain in left knee: Secondary | ICD-10-CM

## 2017-05-17 DIAGNOSIS — G8929 Other chronic pain: Secondary | ICD-10-CM

## 2017-05-17 DIAGNOSIS — M546 Pain in thoracic spine: Secondary | ICD-10-CM

## 2017-05-17 DIAGNOSIS — R262 Difficulty in walking, not elsewhere classified: Secondary | ICD-10-CM

## 2017-05-17 DIAGNOSIS — M25561 Pain in right knee: Principal | ICD-10-CM

## 2017-05-17 NOTE — Therapy (Signed)
[facility] [address] [place], [address] Phone: [phone]   Fax:  [phone]  Physical Therapy Treatment  Patient Details  Name: Lindsay Tran MRN: [id number] Date of Birth: [birth year] Referring Provider: Dr. Caryl Bis   Encounter Date: 05/17/2017  PT End of Session - 05/17/17 1507    Visit Number  6    Number of Visits  12    Date for PT Re-Evaluation  06/15/17    Authorization Type  5    Authorization Time Period  of 10 g-code    PT Start Time  1040    PT Stop Time  1125    PT Time Calculation (min)  45 min    Activity Tolerance  Patient tolerated treatment well    Behavior During Therapy  [facility] for tasks assessed/performed       Past Medical History:  Diagnosis Date  . Abdominal pain   . Anemia   . Anxiety    adjustment disorder with anxiety  . Arthritis   . BPPV (benign paroxysmal positional vertigo)   . Cataract   . Chronic diarrhea   . Complication of anesthesia   . Coronary artery disease   . Dysphasia   . Gastric ulcer 1980's  . GERD (gastroesophageal reflux disease)   . High cholesterol   . Hypertension   . Osteoporosis   . Schatzki's ring 05/02/2013  . Scoliosis     Past Surgical History:  Procedure Laterality Date  . ABDOMINAL HYSTERECTOMY    . CARDIAC CATHETERIZATION    . CHOLECYSTECTOMY    . COLONOSCOPY    . DIAGNOSTIC LAPAROSCOPY     paraesphogeal hernia  . ESOPHAGOGASTRODUODENOSCOPY    . HERNIA REPAIR     inguinal hernia    There were no vitals filed for this visit.  Subjective Assessment - 05/17/17 1107    Subjective  Patient reports her knee is feeling much better, she is interested in learning exercises to help with her pain from thoracic scoliosis. She is having pain and difficulty with tasks involving cutting, cooking, or manipulating objects in front of her.     Pertinent History  Hx of scoliosis and B knee arthritis; Hx of recent fall    Limitations   Standing;Walking;House hold activities    How long can you stand comfortably?  5 minutes    How long can you walk comfortably?  5 minutes    Diagnostic tests  X ray showing moderate arthritis (tri-compartmental).     Patient Stated Goals  To be able to do household chores and stand longer periods with less pain and less loss of balance.    Currently in Pain?  Other (Comment) Pain with prolonged activities, but much less at rest this date.          Seated OMEGA rows with 10# x 12 repetitions for 3 sets  Side leaning Schroth method with R hand pushing into thigh education with printout provided x 45"   Schroth method squat with overhead Hand hold -- multiple bouts of cuing and height adjustment as no ladder wall was present in clinic, she was able to finally complete appropriately, though unsure if this would provide correct line of force given her set up, thus dc'd.   Band pull aparts with red t-band, cuing to modify for more upright posture as her natural posture if to flex at the trunk x 10 for 2 sets  PT Education - 05/17/17 1154    Education provided  Yes    Education Details  Will refer to a Schroth therapist     Person(s) Educated  Patient    Methods  Explanation;Demonstration;Handout    Comprehension  Verbalized understanding;Returned demonstration       PT Short Term Goals - 05/01/17 1236      PT SHORT TERM GOAL #1   Title  Pt will be educated in and I with a new home exercise program to improve overall strength, flexibility, and ROM, as well as improve balance.    Baseline  no exercise home program    Time  1    Period  Weeks    Status  New    Target Date  05/08/17        PT Long Term Goals - 05/01/17 1237      PT LONG TERM GOAL #1   Title  Pt will have an improved Berg Balance score by at least 30% indicating less fall risk.    Baseline  45 at intial PT eval    Time  6    Period  Weeks    Status  New    Target Date   06/12/17      PT LONG TERM GOAL #2   Title  Pt will report being able to perform all household chores with less pain and difficulty due to improved strength, flexibility, and stamina.    Baseline  Pt cannot tolerate standing longer than 5 minutes to do most household chores due to back pain    Time  6    Period  Weeks    Status  New    Target Date  06/12/17      PT LONG TERM GOAL #3   Title  Pt will ambulate with an improved gait pattern, with less loss of balance and least restrictive A device.    Baseline  Pt currently uses no A device, and has a shuffling gait pattern with some loss of balance during ambulation with self correction and tends to ambulate towards the left    Time  6    Period  Weeks    Status  New    Target Date  06/12/17      PT LONG TERM GOAL #4   Title  Pt will report R knee pain is 1-2/10 at worst, and report less stiffness allowing her to ambulate with less pain and difficulty.    Baseline  Currently c/o stiffness and pain with standing and ambulation    Time  6    Period  Weeks    Target Date  06/12/17            Plan - 05/17/17 1507    Clinical Impression Statement  Patient has had good response to quadriceps stretching program in relieving some of her knee pain. She has had long standing thoracic scoliosis, and as such has a notable kyphotic curve in upper thoracic spine as well as concavity on R side of spine. She is having pain with activites anterior to her COM, likely from poor length tension relationship, which will be addressed as able with extension based exercises and will likely refer her to a Schroth trained therapist when she is able to transition.     Clinical Presentation  Stable    Clinical Decision Making  Moderate    Rehab Potential  Fair    Clinical Impairments Affecting Rehab Potential  scoliosis, long hx of  back and knee pain    PT Frequency  2x / week    PT Duration  6 weeks    PT Treatment/Interventions  Moist Heat;Gait  training;Therapeutic activities;Therapeutic exercise;Balance training;Neuromuscular re-education;Patient/family education    PT Next Visit Plan  Review home exercises and advance them as tolerated; balance training    PT Home Exercise Plan  Thoracic stretches in sitting; Rows with RTB; SAQ with 2# wt; Tandem stance and SLS at kitchen sink    Consulted and Agree with Plan of Care  Patient       Patient will benefit from skilled therapeutic intervention in order to improve the following deficits and impairments:  Abnormal gait, Decreased activity tolerance, Decreased balance, Decreased endurance, Difficulty walking, Impaired flexibility, Postural dysfunction, Decreased range of motion, Pain  Visit Diagnosis: Chronic pain of right knee  Difficulty walking  Chronic right-sided thoracic back pain  Chronic pain of left knee     Problem List Patient Active Problem List   Diagnosis Date Noted  . Scoliosis 04/06/2017  . Fall 04/06/2017  . Breast pain, left 04/06/2017  . Macular degeneration 04/06/2017  . Arthritis of left ankle 12/05/2016  . Atrophic vaginitis 12/05/2016  . Dysuria 09/20/2016  . Encounter for general adult medical examination with abnormal findings 09/04/2016  . Pedal edema 09/04/2016  . Dysphagia 09/04/2016  . Chronic pain of right knee 09/04/2016  . BPPV (benign paroxysmal positional vertigo) 12/24/2015  . Abdominal pain 08/03/2015  . Hyperlipidemia 07/27/2015  . Benign essential HTN 07/27/2015  . Rash and nonspecific skin eruption 07/27/2015  . Family history of colon cancer 12/18/2014  . Chronic diarrhea 12/17/2014  . Gastroesophageal hernia 05/22/2013  . CAD in native artery 09/06/2012  . Acid reflux 09/06/2012  . Adjustment disorder with anxiety 12/27/2006   Royce Macadamia PT, DPT, CSCS    05/17/2017, 3:12 PM  [facility] [address], [place], [address] Phone: [phone]    Fax:  [phone]  Name: Lindsay Tran MRN: [id number] Date of Birth: 1946/03/26

## 2017-05-17 NOTE — Patient Instructions (Signed)
Seated OMEGA rows with 10# x 12 repetitions   Side leaning Schroth method with R hand pushing into thigh education with printout provided x 45"   Schroth method squat with overhead Hand hold   Band pull aparts with red t-band, cuing to modify for more upright posture as her natural posture if to flex at the trunk x 10 for 2 sets

## 2017-05-22 ENCOUNTER — Ambulatory Visit: Payer: PPO | Admitting: Physical Therapy

## 2017-05-22 DIAGNOSIS — R262 Difficulty in walking, not elsewhere classified: Secondary | ICD-10-CM

## 2017-05-22 DIAGNOSIS — M546 Pain in thoracic spine: Secondary | ICD-10-CM

## 2017-05-22 DIAGNOSIS — G8929 Other chronic pain: Secondary | ICD-10-CM

## 2017-05-22 DIAGNOSIS — M25561 Pain in right knee: Principal | ICD-10-CM

## 2017-05-22 NOTE — Therapy (Signed)
[facility] [address], [place], [address] Phone: [phone]   Fax:  [phone]  Physical Therapy Treatment  Patient Details  Name: Lindsay Tran MRN: [id number] Date of Birth: Nov 16, 1945 Referring Provider: Dr. Caryl Bis   Encounter Date: 05/22/2017  PT End of Session - 05/22/17 1429    Visit Number  7    Number of Visits  12    Date for PT Re-Evaluation  06/15/17    Authorization Type  7    Authorization Time Period  of 10 g-code    PT Start Time  1036    PT Stop Time  1120    PT Time Calculation (min)  44 min    Activity Tolerance  Patient tolerated treatment well    Behavior During Therapy  [facility] for tasks assessed/performed       Past Medical History:  Diagnosis Date  . Abdominal pain   . Anemia   . Anxiety    adjustment disorder with anxiety  . Arthritis   . BPPV (benign paroxysmal positional vertigo)   . Cataract   . Chronic diarrhea   . Complication of anesthesia   . Coronary artery disease   . Dysphasia   . Gastric ulcer 1980's  . GERD (gastroesophageal reflux disease)   . High cholesterol   . Hypertension   . Osteoporosis   . Schatzki's ring 05/02/2013  . Scoliosis     Past Surgical History:  Procedure Laterality Date  . ABDOMINAL HYSTERECTOMY    . CARDIAC CATHETERIZATION    . CHOLECYSTECTOMY    . COLONOSCOPY    . DIAGNOSTIC LAPAROSCOPY     paraesphogeal hernia  . ESOPHAGOGASTRODUODENOSCOPY    . HERNIA REPAIR     inguinal hernia    There were no vitals filed for this visit.  Subjective Assessment - 05/22/17 1427    Subjective  Patient reports she was able to walk around more than normal over the weekend before her knees bothered her, she has noticed her R arm is functionally longer than her L arm.     Pertinent History  Hx of scoliosis and B knee arthritis; Hx of recent fall    Limitations  Standing;Walking;House hold activities    How long can you stand  comfortably?  5 minutes    How long can you walk comfortably?  5 minutes    Diagnostic tests  X ray showing moderate arthritis (tri-compartmental).     Patient Stated Goals  To be able to do household chores and stand longer periods with less pain and less loss of balance.    Currently in Pain?  Yes    Pain Score  -- Reports some discomfort in her R knee    Pain Location  Knee    Pain Orientation  Right    Pain Descriptors / Indicators  Aching    Pain Type  Chronic pain    Pain Onset  More than a month ago    Pain Frequency  Intermittent       Seated thoracic rotation to L with R hand holding L thigh, L hand sidebending to the R laterally flexing for 5 bouts x 30" holds   Standing staggered stance with LLE posterior and on elevated surface side bend and L shoulder press with 2# DB x 15 repetitions   Standing chest press in staggered stance with LLE posterior to RLE with 5# cable on LUE x 12 repetitions   Band  around L ankle (yellow) going from anterior to posterior of RLE x 12 repetitons   Prone quad stretching on RLE x 5 minutes with improving flexibility noted on RLE and no reports of discomfort   R calf stretching in supine x 10 repetitions for 5-10" holds (patient reported decreased knee pain after completion.                        PT Education - 05/22/17 1428    Education provided  Yes    Education Details  Provided HEP to address scoliosis in thoracic spine     Person(s) Educated  Patient    Methods  Explanation;Demonstration;Verbal cues;Handout    Comprehension  Verbalized understanding;Returned demonstration;Verbal cues required       PT Short Term Goals - 05/01/17 1236      PT SHORT TERM GOAL #1   Title  Pt will be educated in and I with a new home exercise program to improve overall strength, flexibility, and ROM, as well as improve balance.    Baseline  no exercise home program    Time  1    Period  Weeks    Status  New    Target Date   05/08/17        PT Long Term Goals - 05/01/17 1237      PT LONG TERM GOAL #1   Title  Pt will have an improved Berg Balance score by at least 30% indicating less fall risk.    Baseline  45 at intial PT eval    Time  6    Period  Weeks    Status  New    Target Date  06/12/17      PT LONG TERM GOAL #2   Title  Pt will report being able to perform all household chores with less pain and difficulty due to improved strength, flexibility, and stamina.    Baseline  Pt cannot tolerate standing longer than 5 minutes to do most household chores due to back pain    Time  6    Period  Weeks    Status  New    Target Date  06/12/17      PT LONG TERM GOAL #3   Title  Pt will ambulate with an improved gait pattern, with less loss of balance and least restrictive A device.    Baseline  Pt currently uses no A device, and has a shuffling gait pattern with some loss of balance during ambulation with self correction and tends to ambulate towards the left    Time  6    Period  Weeks    Status  New    Target Date  06/12/17      PT LONG TERM GOAL #4   Title  Pt will report R knee pain is 1-2/10 at worst, and report less stiffness allowing her to ambulate with less pain and difficulty.    Baseline  Currently c/o stiffness and pain with standing and ambulation    Time  6    Period  Weeks    Target Date  06/12/17            Plan - 05/22/17 1429    Clinical Impression Statement  Patient continues to have positive response to quadricep and non-agressive calf stretching on RLE. She was provided with HEP to address scoliosis in thoracic spine this date, which consisted of lengthening of L side and strengthening of thoracic musculature.  She would benefit from continued monitoring of exercise technique to ensure appropriate intervention is being delivered.     Clinical Presentation  Stable    Clinical Decision Making  Moderate    Rehab Potential  Fair    Clinical Impairments Affecting Rehab  Potential  scoliosis, long hx of back and knee pain    PT Frequency  2x / week    PT Duration  6 weeks    PT Treatment/Interventions  Moist Heat;Gait training;Therapeutic activities;Therapeutic exercise;Balance training;Neuromuscular re-education;Patient/family education    PT Next Visit Plan  Review home exercises and advance them as tolerated; balance training    PT Home Exercise Plan  Thoracic stretches in sitting; Rows with RTB; SAQ with 2# wt; Tandem stance and SLS at kitchen sink    Consulted and Agree with Plan of Care  Patient       Patient will benefit from skilled therapeutic intervention in order to improve the following deficits and impairments:  Abnormal gait, Decreased activity tolerance, Decreased balance, Decreased endurance, Difficulty walking, Impaired flexibility, Postural dysfunction, Decreased range of motion, Pain  Visit Diagnosis: Chronic pain of right knee  Difficulty walking  Chronic right-sided thoracic back pain     Problem List Patient Active Problem List   Diagnosis Date Noted  . Scoliosis 04/06/2017  . Fall 04/06/2017  . Breast pain, left 04/06/2017  . Macular degeneration 04/06/2017  . Arthritis of left ankle 12/05/2016  . Atrophic vaginitis 12/05/2016  . Dysuria 09/20/2016  . Encounter for general adult medical examination with abnormal findings 09/04/2016  . Pedal edema 09/04/2016  . Dysphagia 09/04/2016  . Chronic pain of right knee 09/04/2016  . BPPV (benign paroxysmal positional vertigo) 12/24/2015  . Abdominal pain 08/03/2015  . Hyperlipidemia 07/27/2015  . Benign essential HTN 07/27/2015  . Rash and nonspecific skin eruption 07/27/2015  . Family history of colon cancer 12/18/2014  . Chronic diarrhea 12/17/2014  . Gastroesophageal hernia 05/22/2013  . CAD in native artery 09/06/2012  . Acid reflux 09/06/2012  . Adjustment disorder with anxiety 12/27/2006   Royce Macadamia PT, DPT, CSCS    05/22/2017, 2:35 PM  [facility] [address] [place], [address] Phone: [phone]   Fax:  [phone]  Name: Lindsay Tran MRN: [id number] Date of Birth: 12/31/1945

## 2017-05-24 ENCOUNTER — Ambulatory Visit: Payer: PPO | Admitting: Physical Therapy

## 2017-05-24 DIAGNOSIS — M25561 Pain in right knee: Secondary | ICD-10-CM | POA: Diagnosis not present

## 2017-05-24 DIAGNOSIS — M25562 Pain in left knee: Secondary | ICD-10-CM

## 2017-05-24 DIAGNOSIS — G8929 Other chronic pain: Secondary | ICD-10-CM

## 2017-05-24 DIAGNOSIS — R262 Difficulty in walking, not elsewhere classified: Secondary | ICD-10-CM

## 2017-05-24 DIAGNOSIS — M546 Pain in thoracic spine: Secondary | ICD-10-CM

## 2017-05-24 NOTE — Therapy (Signed)
[facility] [address] [place], [address] Phone: [phone]   Fax:  [phone]  Physical Therapy Treatment  Patient Details  Name: Lindsay Tran MRN: [id number] Date of Birth: 1945-08-05 Referring Provider: Dr. Caryl Bis   Encounter Date: 05/24/2017  PT End of Session - 05/24/17 1056    Visit Number  8    Number of Visits  12    Date for PT Re-Evaluation  06/15/17    Authorization Type  8    Authorization Time Period  of 10 g-code    PT Start Time  1038    PT Stop Time  1121    PT Time Calculation (min)  43 min    Activity Tolerance  Patient tolerated treatment well    Behavior During Therapy  [facility] for tasks assessed/performed       Past Medical History:  Diagnosis Date  . Abdominal pain   . Anemia   . Anxiety    adjustment disorder with anxiety  . Arthritis   . BPPV (benign paroxysmal positional vertigo)   . Cataract   . Chronic diarrhea   . Complication of anesthesia   . Coronary artery disease   . Dysphasia   . Gastric ulcer 1980's  . GERD (gastroesophageal reflux disease)   . High cholesterol   . Hypertension   . Osteoporosis   . Schatzki's ring 05/02/2013  . Scoliosis     Past Surgical History:  Procedure Laterality Date  . ABDOMINAL HYSTERECTOMY    . CARDIAC CATHETERIZATION    . CHOLECYSTECTOMY    . COLONOSCOPY    . COLONOSCOPY N/A 11/12/2014   Procedure: COLONOSCOPY;  Surgeon: Manya Silvas, MD;  Location: [facility] ENDOSCOPY;  Service: Endoscopy;  Laterality: N/A;  . DIAGNOSTIC LAPAROSCOPY     paraesphogeal hernia  . ESOPHAGOGASTRODUODENOSCOPY    . ESOPHAGOGASTRODUODENOSCOPY (EGD) WITH PROPOFOL N/A 04/16/2017   Procedure: ESOPHAGOGASTRODUODENOSCOPY (EGD) WITH PROPOFOL;  Surgeon: Manya Silvas, MD;  Location: [facility] ENDOSCOPY;  Service: Endoscopy;  Laterality: N/A;  . HERNIA REPAIR     inguinal hernia    There were no vitals filed for this visit.  Subjective  Assessment - 05/24/17 1039    Subjective  Patient reports she has been able to figure out how to set up all of the exercises at home. Her knees are aching more than they had been today, she states this is likely because she didn't stretch yesterday.     Pertinent History  Hx of scoliosis and B knee arthritis; Hx of recent fall    Limitations  Standing;Walking;House hold activities    How long can you stand comfortably?  5 minutes    How long can you walk comfortably?  5 minutes    Diagnostic tests  X ray showing moderate arthritis (tri-compartmental).     Patient Stated Goals  To be able to do household chores and stand longer periods with less pain and less loss of balance.    Currently in Pain?  Yes    Pain Score  -- Reports more discomfort than normal in her anterior portion of her knees    Pain Location  Knee    Pain Orientation  Right;Left    Pain Descriptors / Indicators  Aching    Pain Onset  More than a month ago    Pain Frequency  Intermittent         Quad stretching in prone position x 6 minutes on RLE, x  3 minutes on LLE (notable for roughly 10 degrees more flexion on LLE on RLE)  Sidelying clamshells with red t-band x 15 progressed to green t-band x 12 for 2 sets   Standing fire hydrants (too difficult for her to control with just glute med, subbing in TFL) switched to standing hip abductions, still felt in TFL even with cuing, so switched to MATRIX machine with 25# x 12 for 2 sets  Standing hip extensions on MATRIX with 25# x 12 for 2 sets bilaterally   Soft tissue mobilization over medial compartment of thigh and adductors just proximal to the knee joint on RLE, with multiple trigger points noted, she reported improved symptoms and less tightness/pain after completion                     PT Education - 05/24/17 1253    Education provided  Yes    Education Details  Will discuss progression of therapy and her HEP in follow up session.     Person(s)  Educated  Patient    Methods  Explanation    Comprehension  Verbalized understanding       PT Short Term Goals - 05/01/17 1236      PT SHORT TERM GOAL #1   Title  Pt will be educated in and I with a new home exercise program to improve overall strength, flexibility, and ROM, as well as improve balance.    Baseline  no exercise home program    Time  1    Period  Weeks    Status  New    Target Date  05/08/17        PT Long Term Goals - 05/01/17 1237      PT LONG TERM GOAL #1   Title  Pt will have an improved Berg Balance score by at least 30% indicating less fall risk.    Baseline  45 at intial PT eval    Time  6    Period  Weeks    Status  New    Target Date  06/12/17      PT LONG TERM GOAL #2   Title  Pt will report being able to perform all household chores with less pain and difficulty due to improved strength, flexibility, and stamina.    Baseline  Pt cannot tolerate standing longer than 5 minutes to do most household chores due to back pain    Time  6    Period  Weeks    Status  New    Target Date  06/12/17      PT LONG TERM GOAL #3   Title  Pt will ambulate with an improved gait pattern, with less loss of balance and least restrictive A device.    Baseline  Pt currently uses no A device, and has a shuffling gait pattern with some loss of balance during ambulation with self correction and tends to ambulate towards the left    Time  6    Period  Weeks    Status  New    Target Date  06/12/17      PT LONG TERM GOAL #4   Title  Pt will report R knee pain is 1-2/10 at worst, and report less stiffness allowing her to ambulate with less pain and difficulty.    Baseline  Currently c/o stiffness and pain with standing and ambulation    Time  6    Period  Weeks    Target  Date  06/12/17            Plan - 05/24/17 1056    Clinical Impression Statement  Patient demonstrates significant deficit in R quadriceps flexibility relative to her LLE. She has been able to set  up HEP for scoliosis management, and has noted to have incrased hip abduction strength on resistance machines, though she is noted to have preferential activation of TFL over glute med, likely contributing to her lateral R knee pain.     Clinical Presentation  Stable    Clinical Decision Making  Moderate    Rehab Potential  Fair    Clinical Impairments Affecting Rehab Potential  scoliosis, long hx of back and knee pain    PT Frequency  2x / week    PT Duration  6 weeks    PT Treatment/Interventions  Moist Heat;Gait training;Therapeutic activities;Therapeutic exercise;Balance training;Neuromuscular re-education;Patient/family education    PT Next Visit Plan  Review home exercises and advance them as tolerated; balance training    PT Home Exercise Plan  Thoracic stretches in sitting; Rows with RTB; SAQ with 2# wt; Tandem stance and SLS at kitchen sink    Consulted and Agree with Plan of Care  Patient       Patient will benefit from skilled therapeutic intervention in order to improve the following deficits and impairments:  Abnormal gait, Decreased activity tolerance, Decreased balance, Decreased endurance, Difficulty walking, Impaired flexibility, Postural dysfunction, Decreased range of motion, Pain  Visit Diagnosis: Chronic pain of right knee  Difficulty walking  Chronic right-sided thoracic back pain  Chronic pain of left knee     Problem List Patient Active Problem List   Diagnosis Date Noted  . Scoliosis 04/06/2017  . Fall 04/06/2017  . Breast pain, left 04/06/2017  . Macular degeneration 04/06/2017  . Arthritis of left ankle 12/05/2016  . Atrophic vaginitis 12/05/2016  . Dysuria 09/20/2016  . Encounter for general adult medical examination with abnormal findings 09/04/2016  . Pedal edema 09/04/2016  . Dysphagia 09/04/2016  . Chronic pain of right knee 09/04/2016  . BPPV (benign paroxysmal positional vertigo) 12/24/2015  . Abdominal pain 08/03/2015  . Hyperlipidemia  07/27/2015  . Benign essential HTN 07/27/2015  . Rash and nonspecific skin eruption 07/27/2015  . Family history of colon cancer 12/18/2014  . Chronic diarrhea 12/17/2014  . Gastroesophageal hernia 05/22/2013  . CAD in native artery 09/06/2012  . Acid reflux 09/06/2012  . Adjustment disorder with anxiety 12/27/2006   Royce Macadamia PT, DPT, CSCS    05/24/2017, 12:57 PM  [facility] [address], [place], [address] Phone: [phone]   Fax:  [phone]  Name: Lindsay Tran MRN: [id number] Date of Birth: 06-May-1946

## 2017-05-24 NOTE — Patient Instructions (Addendum)
Quad stretching   Sidelying clamshells with red t-band x 15 progressed to green t-band x 12 for 2 sets   Standing fire hydrants (too difficult for her to control with just glute med, subbing in TFL) switched to standing hip abductions, still felt in TFL even with cuing, so switched to MATRIX machine with 25# x 12 for 2 sets  Standing hip extensions on MATRIX with 25# x 12 for 2 sets bilaterally

## 2017-05-29 ENCOUNTER — Ambulatory Visit: Payer: PPO | Admitting: Physical Therapy

## 2017-05-29 DIAGNOSIS — G8929 Other chronic pain: Secondary | ICD-10-CM

## 2017-05-29 DIAGNOSIS — R262 Difficulty in walking, not elsewhere classified: Secondary | ICD-10-CM

## 2017-05-29 DIAGNOSIS — M25561 Pain in right knee: Secondary | ICD-10-CM | POA: Diagnosis not present

## 2017-05-29 DIAGNOSIS — M25562 Pain in left knee: Secondary | ICD-10-CM

## 2017-05-29 NOTE — Patient Instructions (Addendum)
High volt E-stim provided to medial adductors/medial quadriceps at 145V and IT band distally at 125V with moist hot pack x 15 minutes   Prone quad stretching in Ely's position on RLE   Single leg squats on total gym level 22 x 10 per side for 2 sets bilaterally

## 2017-05-29 NOTE — Therapy (Signed)
[facility] [address] [place], [address] Phone: [phone]   Fax:  [phone]  Physical Therapy Treatment  Patient Details  Name: Lindsay Tran MRN: [id number] Date of Birth: 02/21/1946 Referring Provider: Dr. Caryl Bis   Encounter Date: 05/29/2017  PT End of Session - 05/29/17 1044    Visit Number  9    Number of Visits  12    Date for PT Re-Evaluation  06/15/17    Authorization Type  8    Authorization Time Period  of 10 g-code    PT Start Time  1034    PT Stop Time  1115    PT Time Calculation (min)  41 min    Activity Tolerance  Patient tolerated treatment well    Behavior During Therapy  [facility] for tasks assessed/performed       Past Medical History:  Diagnosis Date  . Abdominal pain   . Anemia   . Anxiety    adjustment disorder with anxiety  . Arthritis   . BPPV (benign paroxysmal positional vertigo)   . Cataract   . Chronic diarrhea   . Complication of anesthesia   . Coronary artery disease   . Dysphasia   . Gastric ulcer 1980's  . GERD (gastroesophageal reflux disease)   . High cholesterol   . Hypertension   . Osteoporosis   . Schatzki's ring 05/02/2013  . Scoliosis     Past Surgical History:  Procedure Laterality Date  . ABDOMINAL HYSTERECTOMY    . CARDIAC CATHETERIZATION    . CHOLECYSTECTOMY    . COLONOSCOPY    . COLONOSCOPY N/A 11/12/2014   Procedure: COLONOSCOPY;  Surgeon: Manya Silvas, MD;  Location: [facility] ENDOSCOPY;  Service: Endoscopy;  Laterality: N/A;  . DIAGNOSTIC LAPAROSCOPY     paraesphogeal hernia  . ESOPHAGOGASTRODUODENOSCOPY    . ESOPHAGOGASTRODUODENOSCOPY (EGD) WITH PROPOFOL N/A 04/16/2017   Procedure: ESOPHAGOGASTRODUODENOSCOPY (EGD) WITH PROPOFOL;  Surgeon: Manya Silvas, MD;  Location: [facility] ENDOSCOPY;  Service: Endoscopy;  Laterality: N/A;  . HERNIA REPAIR     inguinal hernia    There were no vitals filed for this visit.  Subjective  Assessment - 05/29/17 1035    Subjective  Patient reports the medial wedge and heel lift for her R foot has been helpful. She reports her knee has been feeling better, as has her mid back.     Pertinent History  Hx of scoliosis and B knee arthritis; Hx of recent fall    Limitations  Standing;Walking;House hold activities    How long can you stand comfortably?  5 minutes    How long can you walk comfortably?  5 minutes    Diagnostic tests  X ray showing moderate arthritis (tri-compartmental).     Patient Stated Goals  To be able to do household chores and stand longer periods with less pain and less loss of balance.    Currently in Pain?  Other (Comment) R knee is always "stiffer" than her L knee.        High volt E-stim provided to medial adductors/medial quadriceps at 145V and IT band distally at 125V with moist hot pack x 15 minutes   Prone quad stretching in Ely's position on RLE x 10 minutes with rest breaks provided, compared to LLE and still noted to have 5-10 degree deficit indicative of significant quadriceps flexibility deficit.   Single leg squats on total gym level 22 x 10 per side for 2  sets bilaterally                         PT Education - 05/29/17 1044    Education provided  Yes    Education Details  Will provide HEP for strengthening of LEs at follow up.     Person(s) Educated  Patient    Methods  Explanation    Comprehension  Verbalized understanding       PT Short Term Goals - 05/01/17 1236      PT SHORT TERM GOAL #1   Title  Pt will be educated in and I with a new home exercise program to improve overall strength, flexibility, and ROM, as well as improve balance.    Baseline  no exercise home program    Time  1    Period  Weeks    Status  New    Target Date  05/08/17        PT Long Term Goals - 05/01/17 1237      PT LONG TERM GOAL #1   Title  Pt will have an improved Berg Balance score by at least 30% indicating less fall risk.     Baseline  45 at intial PT eval    Time  6    Period  Weeks    Status  New    Target Date  06/12/17      PT LONG TERM GOAL #2   Title  Pt will report being able to perform all household chores with less pain and difficulty due to improved strength, flexibility, and stamina.    Baseline  Pt cannot tolerate standing longer than 5 minutes to do most household chores due to back pain    Time  6    Period  Weeks    Status  New    Target Date  06/12/17      PT LONG TERM GOAL #3   Title  Pt will ambulate with an improved gait pattern, with less loss of balance and least restrictive A device.    Baseline  Pt currently uses no A device, and has a shuffling gait pattern with some loss of balance during ambulation with self correction and tends to ambulate towards the left    Time  6    Period  Weeks    Status  New    Target Date  06/12/17      PT LONG TERM GOAL #4   Title  Pt will report R knee pain is 1-2/10 at worst, and report less stiffness allowing her to ambulate with less pain and difficulty.    Baseline  Currently c/o stiffness and pain with standing and ambulation    Time  6    Period  Weeks    Target Date  06/12/17            Plan - 05/29/17 1045    Clinical Impression Statement  Patient has continued to progress in R quadriceps flexibility in each session. She does has marked decrease in LE strength as evidenced by use of UEs and valgus collapse of bilateral LEs during transfers. She would benefit from continued stretching of her R quadricep while progressively loading her LEs to increase LE strength to decrease her discomfort in her R knee.     Clinical Presentation  Stable    Clinical Decision Making  Moderate    Rehab Potential  Fair    Clinical Impairments Affecting Rehab Potential  scoliosis, long hx of back and knee pain    PT Frequency  2x / week    PT Duration  6 weeks    PT Treatment/Interventions  Moist Heat;Gait training;Therapeutic activities;Therapeutic  exercise;Balance training;Neuromuscular re-education;Patient/family education    PT Next Visit Plan  Review home exercises and advance them as tolerated; balance training    PT Home Exercise Plan  Thoracic stretches in sitting; Rows with RTB; SAQ with 2# wt; Tandem stance and SLS at kitchen sink    Consulted and Agree with Plan of Care  Patient       Patient will benefit from skilled therapeutic intervention in order to improve the following deficits and impairments:  Abnormal gait, Decreased activity tolerance, Decreased balance, Decreased endurance, Difficulty walking, Impaired flexibility, Postural dysfunction, Decreased range of motion, Pain  Visit Diagnosis: Chronic pain of right knee  Difficulty walking  Chronic pain of left knee     Problem List Patient Active Problem List   Diagnosis Date Noted  . Scoliosis 04/06/2017  . Fall 04/06/2017  . Breast pain, left 04/06/2017  . Macular degeneration 04/06/2017  . Arthritis of left ankle 12/05/2016  . Atrophic vaginitis 12/05/2016  . Dysuria 09/20/2016  . Encounter for general adult medical examination with abnormal findings 09/04/2016  . Pedal edema 09/04/2016  . Dysphagia 09/04/2016  . Chronic pain of right knee 09/04/2016  . BPPV (benign paroxysmal positional vertigo) 12/24/2015  . Abdominal pain 08/03/2015  . Hyperlipidemia 07/27/2015  . Benign essential HTN 07/27/2015  . Rash and nonspecific skin eruption 07/27/2015  . Family history of colon cancer 12/18/2014  . Chronic diarrhea 12/17/2014  . Gastroesophageal hernia 05/22/2013  . CAD in native artery 09/06/2012  . Acid reflux 09/06/2012  . Adjustment disorder with anxiety 12/27/2006   Royce Macadamia PT, DPT, CSCS    05/29/2017, 4:17 PM  [facility] [address], [place], [address] Phone: [phone]   Fax:  [phone]  Name: TAKENYA TRAVAGLINI MRN: [id number] Date of Birth:  October 29, 1945

## 2017-06-15 ENCOUNTER — Other Ambulatory Visit: Payer: Self-pay

## 2017-06-15 ENCOUNTER — Inpatient Hospital Stay: Payer: PPO

## 2017-06-15 ENCOUNTER — Encounter: Payer: Self-pay | Admitting: Oncology

## 2017-06-15 ENCOUNTER — Telehealth: Payer: Self-pay | Admitting: Oncology

## 2017-06-15 ENCOUNTER — Inpatient Hospital Stay: Payer: PPO | Attending: Oncology | Admitting: Oncology

## 2017-06-15 VITALS — BP 125/70 | HR 58 | Temp 97.4°F | Resp 18 | Wt 144.4 lb

## 2017-06-15 DIAGNOSIS — D75839 Thrombocytosis, unspecified: Secondary | ICD-10-CM

## 2017-06-15 DIAGNOSIS — I2510 Atherosclerotic heart disease of native coronary artery without angina pectoris: Secondary | ICD-10-CM | POA: Insufficient documentation

## 2017-06-15 DIAGNOSIS — I10 Essential (primary) hypertension: Secondary | ICD-10-CM | POA: Insufficient documentation

## 2017-06-15 DIAGNOSIS — D509 Iron deficiency anemia, unspecified: Secondary | ICD-10-CM | POA: Insufficient documentation

## 2017-06-15 DIAGNOSIS — Z7982 Long term (current) use of aspirin: Secondary | ICD-10-CM | POA: Diagnosis not present

## 2017-06-15 DIAGNOSIS — D473 Essential (hemorrhagic) thrombocythemia: Secondary | ICD-10-CM | POA: Insufficient documentation

## 2017-06-15 DIAGNOSIS — Z79899 Other long term (current) drug therapy: Secondary | ICD-10-CM | POA: Insufficient documentation

## 2017-06-15 DIAGNOSIS — E7800 Pure hypercholesterolemia, unspecified: Secondary | ICD-10-CM | POA: Diagnosis not present

## 2017-06-15 DIAGNOSIS — M810 Age-related osteoporosis without current pathological fracture: Secondary | ICD-10-CM | POA: Insufficient documentation

## 2017-06-15 DIAGNOSIS — K219 Gastro-esophageal reflux disease without esophagitis: Secondary | ICD-10-CM | POA: Insufficient documentation

## 2017-06-15 LAB — CBC WITH DIFFERENTIAL/PLATELET
Basophils Absolute: 0 10*3/uL (ref 0–0.1)
Basophils Relative: 1 %
Eosinophils Absolute: 0.2 10*3/uL (ref 0–0.7)
Eosinophils Relative: 3 %
HEMATOCRIT: 38.4 % (ref 35.0–47.0)
Hemoglobin: 12.7 g/dL (ref 12.0–16.0)
LYMPHS ABS: 1.2 10*3/uL (ref 1.0–3.6)
LYMPHS PCT: 20 %
MCH: 29.5 pg (ref 26.0–34.0)
MCHC: 33.2 g/dL (ref 32.0–36.0)
MCV: 88.9 fL (ref 80.0–100.0)
MONO ABS: 0.5 10*3/uL (ref 0.2–0.9)
MONOS PCT: 9 %
NEUTROS ABS: 4.1 10*3/uL (ref 1.4–6.5)
Neutrophils Relative %: 67 %
Platelets: 363 10*3/uL (ref 150–440)
RBC: 4.32 MIL/uL (ref 3.80–5.20)
RDW: 13.4 % (ref 11.5–14.5)
WBC: 6.1 10*3/uL (ref 3.6–11.0)

## 2017-06-15 NOTE — Progress Notes (Signed)
Hematology/Oncology Consult note [facility]  [phone] [phone]  Patient Care Team: Leone Haven, MD as PCP - General (Family Medicine)   Name of the patient: Lindsay Tran  [id number]  1946-01-03   Date of visit: 06/15/17  Diagnosis-thrombocytosis likely secondary etiology is unclear  Chief complaint/ Reason for visit-routine follow-up of thrombocytosis  Heme/Onc history: patient is a 71 year old female who was been referred to [place] for evaluation and management of thrombocytosis. 2 prior values over the last 1 year had shown that she has mild isolated thrombocytosis in the absence of high white count and high hemoglobin. Her prior platelet count in 2016 April was normal at 409. No prior h/o thrombosis or MI or strokes  Results of blood work from the 16th 2018 were as follows: CBC showed white count of 6.8, H&H of 12.1/36.2 with a platelet count of 446. Testing for BCR able was negative. Jak 2 CALR and MPL testing was negative. Ferritin was low at 15. Iron studies showed a low iron saturation of 9% an elevated TIBC of 514.esr was normal    Interval history-patient feels well and denies any complaints today  ECOG PS- 0 Pain scale- 0   Review of systems- Review of Systems  Constitutional: Negative for chills, fever, malaise/fatigue and weight loss.  HENT: Negative for congestion, ear discharge and nosebleeds.   Eyes: Negative for blurred vision.  Respiratory: Negative for cough, hemoptysis, sputum production, shortness of breath and wheezing.   Cardiovascular: Negative for chest pain, palpitations, orthopnea and claudication.  Gastrointestinal: Negative for abdominal pain, blood in stool, constipation, diarrhea, heartburn, melena, nausea and vomiting.  Genitourinary: Negative for dysuria, flank pain, frequency, hematuria and urgency.  Musculoskeletal: Negative for back pain, joint pain and myalgias.  Skin: Negative for rash.   Neurological: Negative for dizziness, tingling, focal weakness, seizures, weakness and headaches.  Endo/Heme/Allergies: Does not bruise/bleed easily.  Psychiatric/Behavioral: Negative for depression and suicidal ideas. The patient does not have insomnia.        No Known Allergies   Past Medical History:  Diagnosis Date  . Abdominal pain   . Anemia   . Anxiety    adjustment disorder with anxiety  . Arthritis   . BPPV (benign paroxysmal positional vertigo)   . Cataract   . Chronic diarrhea   . Complication of anesthesia   . Coronary artery disease   . Dysphasia   . Gastric ulcer 1980's  . GERD (gastroesophageal reflux disease)   . High cholesterol   . Hypertension   . Osteoporosis   . Schatzki's ring 05/02/2013  . Scoliosis      Past Surgical History:  Procedure Laterality Date  . ABDOMINAL HYSTERECTOMY    . CARDIAC CATHETERIZATION    . CHOLECYSTECTOMY    . COLONOSCOPY    . COLONOSCOPY N/A 11/12/2014   Procedure: COLONOSCOPY;  Surgeon: Manya Silvas, MD;  Location: [facility] ENDOSCOPY;  Service: Endoscopy;  Laterality: N/A;  . DIAGNOSTIC LAPAROSCOPY     paraesphogeal hernia  . ESOPHAGOGASTRODUODENOSCOPY    . ESOPHAGOGASTRODUODENOSCOPY (EGD) WITH PROPOFOL N/A 04/16/2017   Procedure: ESOPHAGOGASTRODUODENOSCOPY (EGD) WITH PROPOFOL;  Surgeon: Manya Silvas, MD;  Location: [facility] ENDOSCOPY;  Service: Endoscopy;  Laterality: N/A;  . HERNIA REPAIR     inguinal hernia    Social History   Socioeconomic History  . Marital status: Single    Spouse name: Not on file  . Number of children: Not on file  . Years of education: Not  on file  . Highest education level: Not on file  Social Needs  . Financial resource strain: Not on file  . Food insecurity - worry: Not on file  . Food insecurity - inability: Not on file  . Transportation needs - medical: Not on file  . Transportation needs - non-medical: Not on file  Occupational History  . Not on file  Tobacco Use  .  Smoking status: Never Smoker  . Smokeless tobacco: Never Used  Substance and Sexual Activity  . Alcohol use: No    Alcohol/week: 0.0 oz  . Drug use: No  . Sexual activity: No  Other Topics Concern  . Not on file  Social History Narrative   Divorced   Retired as a Scientist, research (medical)   12 + years of school     Family History  Problem Relation Age of Onset  . Diabetes Mother   . Parkinson's disease Mother   . Cancer Father        Pancreatic  . Cancer Sister        Lung  . Diabetes Sister   . Alzheimer's disease Maternal Aunt   . Alzheimer's disease Paternal Aunt   . Diabetes Sister   . Alzheimer's disease Sister   . Tics Sister   . Breast cancer Neg Hx      Current Outpatient Medications:  .  aspirin 81 MG tablet, Take 81 mg by mouth daily., Disp: , Rfl:  .  Cyanocobalamin (VITAMIN B 12 PO), Take by mouth., Disp: , Rfl:  .  fenofibrate 160 MG tablet, TAKE ONE TABLET BY MOUTH ONCE DAILY, Disp: 90 tablet, Rfl: 3 .  ferrous sulfate 325 (65 FE) MG tablet, Take 325 mg by mouth 2 (two) times daily with a meal., Disp: , Rfl:  .  metoprolol (LOPRESSOR) 100 MG tablet, TAKE ONE TABLET BY MOUTH TWICE DAILY, Disp: 180 tablet, Rfl: 3 .  Misc Natural Products (OSTEO BI-FLEX/5-LOXIN ADVANCED) TABS, Take 1 tablet by mouth 2 (two) times daily., Disp: , Rfl:  .  Omega-3 Fatty Acids (OMEGA 3 500 PO), Take 1 capsule by mouth daily. , Disp: , Rfl:  .  pantoprazole (PROTONIX) 40 MG tablet, Take 1 tablet (40 mg total) by mouth daily., Disp: 90 tablet, Rfl: 1 .  potassium chloride (K-DUR) 10 MEQ tablet, Take 10 mEq by mouth daily., Disp: , Rfl:  .  Probiotic Product (PROBIOTIC DAILY) CAPS, Take 1 capsule by mouth., Disp: , Rfl:  .  simvastatin (ZOCOR) 40 MG tablet, TAKE ONE TABLET BY MOUTH ONCE DAILY AT  6  PM, Disp: 90 tablet, Rfl: 3 .  cholestyramine light 4 G POWD, Take by mouth 1 day or 1 dose., Disp: , Rfl:  .  clobetasol (OLUX) 0.05 % topical foam, Apply topically 2 (two) times daily. Please  dispense 3 bottles. (Patient not taking: Reported on 06/15/2017), Disp: 50 g, Rfl: 0 .  clobetasol ointment (TEMOVATE) 2.63 %, Apply 1 application topically 2 (two) times daily. (Patient not taking: Reported on 06/15/2017), Disp: 180 g, Rfl: 1 .  conjugated estrogens (PREMARIN) vaginal cream, Place 1 Applicatorful vaginally 2 (two) times a week. Dispense a 90 day supply. (Patient not taking: Reported on 06/15/2017), Disp: 60 g, Rfl: 2 .  ketoconazole (NIZORAL) 2 % cream, Apply 1 application topically daily as needed. , Disp: , Rfl:  .  meclizine (ANTIVERT) 25 MG tablet, Take 1 tablet (25 mg total) by mouth 2 (two) times daily as needed for dizziness. (Patient not taking: Reported  on 06/15/2017), Disp: 20 tablet, Rfl: 0 .  Multiple Vitamins-Minerals (PRESERVISION AREDS 2 PO), Take by mouth., Disp: , Rfl:  .  mupirocin ointment (BACTROBAN) 2 %, Place 1 application into the nose 2 (two) times daily. (Patient not taking: Reported on 06/15/2017), Disp: 22 g, Rfl: 1 .  naproxen sodium (ANAPROX) 220 MG tablet, Take 220 mg by mouth daily. Sometimes pt takes twice a day if needed from pain, Disp: , Rfl:  .  triamcinolone cream (KENALOG) 0.1 %, Apply 1 application topically 2 (two) times daily as needed. (Patient not taking: Reported on 06/15/2017), Disp: 30 g, Rfl: 2  Physical exam:  Vitals:   06/15/17 1123  BP: 125/70  Pulse: (!) 58  Resp: 18  Temp: (!) 97.4 F (36.3 C)  TempSrc: Tympanic  Weight: 144 lb 6.4 oz (65.5 kg)   Physical Exam  Constitutional: She is oriented to person, place, and time and well-developed, well-nourished, and in no distress.  HENT:  Head: Normocephalic and atraumatic.  Eyes: EOM are normal. Pupils are equal, round, and reactive to light.  Neck: Normal range of motion.  Cardiovascular: Normal rate, regular rhythm and normal heart sounds.  Pulmonary/Chest: Effort normal and breath sounds normal.  Abdominal: Soft. Bowel sounds are normal.  Neurological: She is alert and  oriented to person, place, and time.  Skin: Skin is warm and dry.     CMP Latest Ref Rng & Units 09/04/2016  Glucose 70 - 99 mg/dL 97  BUN 6 - 23 mg/dL 17  Creatinine 0.40 - 1.20 mg/dL 0.89  Sodium 135 - 145 mEq/L 136  Potassium 3.5 - 5.1 mEq/L 5.0  Chloride 96 - 112 mEq/L 100  CO2 19 - 32 mEq/L 27  Calcium 8.4 - 10.5 mg/dL 9.7  Total Protein 6.0 - 8.3 g/dL 6.7  Total Bilirubin 0.2 - 1.2 mg/dL 0.6  Alkaline Phos 39 - 117 U/L 40  AST 0 - 37 U/L 20  ALT 0 - 35 U/L 10   CBC Latest Ref Rng & Units 06/15/2017  WBC 3.6 - 11.0 K/uL 6.1  Hemoglobin 12.0 - 16.0 g/dL 12.7  Hematocrit 35.0 - 47.0 % 38.4  Platelets 150 - 440 K/uL 363      Assessment and plan- Patient is a 71 y.o. female with secondary thrombocytosis likely secondary to iron deficiency  Patient noted to have evidence of iron deficiency back in March 2018.  In August 2018 patient continued to have thrombocytosis with a platelet count of 463 despite the fact that her iron levels were normal.  Today however patient's platelet count has come down to 363.  She continues to take oral iron at this time.  BCR able testing as well as Jak 2, CALR and MPL testing were negative for primary problem myeloproliferative disorder.  Given that her thrombocytosis has resolved at this time I am not inclined to pursue a bone marrow biopsy.  We will repeat her CBC with differential along with iron studies in 6 months time and I will see her back in 1 years time with a CBC with differential.   Visit Diagnosis 1. Thrombocytosis ([place])      Dr. Randa Evens, MD, MPH [facility] Pager- [phone] 06/15/2017 3:01 PM

## 2017-06-15 NOTE — Progress Notes (Signed)
Here for follow up ( see pain eval for tongue pain c/o the patient questions if due to low iron )

## 2017-06-15 NOTE — Telephone Encounter (Signed)
Appointment schedule and AVS report mailed to patient.

## 2017-08-06 ENCOUNTER — Other Ambulatory Visit: Payer: Self-pay | Admitting: Family Medicine

## 2017-09-05 ENCOUNTER — Encounter: Payer: PPO | Admitting: Family Medicine

## 2017-09-20 ENCOUNTER — Ambulatory Visit (INDEPENDENT_AMBULATORY_CARE_PROVIDER_SITE_OTHER): Payer: PPO | Admitting: Family Medicine

## 2017-09-20 ENCOUNTER — Encounter: Payer: Self-pay | Admitting: Family Medicine

## 2017-09-20 ENCOUNTER — Other Ambulatory Visit: Payer: Self-pay

## 2017-09-20 VITALS — BP 106/66 | HR 60 | Temp 97.4°F | Ht 60.0 in | Wt 135.6 lb

## 2017-09-20 DIAGNOSIS — M2141 Flat foot [pes planus] (acquired), right foot: Secondary | ICD-10-CM

## 2017-09-20 DIAGNOSIS — M2140 Flat foot [pes planus] (acquired), unspecified foot: Secondary | ICD-10-CM | POA: Insufficient documentation

## 2017-09-20 DIAGNOSIS — D473 Essential (hemorrhagic) thrombocythemia: Secondary | ICD-10-CM

## 2017-09-20 DIAGNOSIS — Z0001 Encounter for general adult medical examination with abnormal findings: Secondary | ICD-10-CM

## 2017-09-20 DIAGNOSIS — M419 Scoliosis, unspecified: Secondary | ICD-10-CM | POA: Diagnosis not present

## 2017-09-20 DIAGNOSIS — E611 Iron deficiency: Secondary | ICD-10-CM | POA: Diagnosis not present

## 2017-09-20 DIAGNOSIS — R7303 Prediabetes: Secondary | ICD-10-CM

## 2017-09-20 DIAGNOSIS — E785 Hyperlipidemia, unspecified: Secondary | ICD-10-CM

## 2017-09-20 DIAGNOSIS — M2142 Flat foot [pes planus] (acquired), left foot: Secondary | ICD-10-CM | POA: Diagnosis not present

## 2017-09-20 DIAGNOSIS — L409 Psoriasis, unspecified: Secondary | ICD-10-CM | POA: Diagnosis not present

## 2017-09-20 DIAGNOSIS — D75839 Thrombocytosis, unspecified: Secondary | ICD-10-CM

## 2017-09-20 LAB — CBC WITH DIFFERENTIAL/PLATELET
Basophils Absolute: 0 10*3/uL (ref 0.0–0.1)
Basophils Relative: 0.7 % (ref 0.0–3.0)
EOS ABS: 0.2 10*3/uL (ref 0.0–0.7)
EOS PCT: 3.9 % (ref 0.0–5.0)
HCT: 39.9 % (ref 36.0–46.0)
Hemoglobin: 13.2 g/dL (ref 12.0–15.0)
Lymphocytes Relative: 22 % (ref 12.0–46.0)
Lymphs Abs: 1.1 10*3/uL (ref 0.7–4.0)
MCHC: 33 g/dL (ref 30.0–36.0)
MCV: 89.7 fl (ref 78.0–100.0)
MONO ABS: 0.5 10*3/uL (ref 0.1–1.0)
Monocytes Relative: 9.2 % (ref 3.0–12.0)
NEUTROS ABS: 3.3 10*3/uL (ref 1.4–7.7)
Neutrophils Relative %: 64.2 % (ref 43.0–77.0)
PLATELETS: 352 10*3/uL (ref 150.0–400.0)
RBC: 4.45 Mil/uL (ref 3.87–5.11)
RDW: 14.4 % (ref 11.5–15.5)
WBC: 5.2 10*3/uL (ref 4.0–10.5)

## 2017-09-20 LAB — COMPREHENSIVE METABOLIC PANEL
ALT: 41 U/L — ABNORMAL HIGH (ref 0–35)
AST: 51 U/L — AB (ref 0–37)
Albumin: 3.7 g/dL (ref 3.5–5.2)
Alkaline Phosphatase: 37 U/L — ABNORMAL LOW (ref 39–117)
BUN: 20 mg/dL (ref 6–23)
CHLORIDE: 102 meq/L (ref 96–112)
CO2: 31 meq/L (ref 19–32)
CREATININE: 0.65 mg/dL (ref 0.40–1.20)
Calcium: 9.5 mg/dL (ref 8.4–10.5)
GFR: 94.68 mL/min (ref >60.00)
GLUCOSE: 97 mg/dL (ref 70–99)
POTASSIUM: 4.3 meq/L (ref 3.5–5.1)
SODIUM: 137 meq/L (ref 135–145)
Total Bilirubin: 0.9 mg/dL (ref 0.2–1.2)
Total Protein: 6.3 g/dL (ref 6.0–8.3)

## 2017-09-20 LAB — LIPID PANEL
CHOL/HDL RATIO: 2
Cholesterol: 100 mg/dL (ref 0–200)
HDL: 43.1 mg/dL (ref >39.00)
LDL Cholesterol: 37 mg/dL (ref 0–99)
NONHDL: 57.22
Triglycerides: 101 mg/dL (ref 0.0–149.0)
VLDL: 20.2 mg/dL (ref 0.0–40.0)

## 2017-09-20 LAB — IRON,TIBC AND FERRITIN PANEL
%SAT: 48 % (calc) (ref 11–50)
Ferritin: 369 ng/mL — ABNORMAL HIGH (ref 20–288)
IRON: 166 ug/dL — AB (ref 45–160)
TIBC: 348 ug/dL (ref 250–450)

## 2017-09-20 LAB — HEMOGLOBIN A1C: HEMOGLOBIN A1C: 5.3 % (ref 4.6–6.5)

## 2017-09-20 MED ORDER — CLOBETASOL PROPIONATE 0.05 % EX OINT: 1.0000 "application " | TOPICAL_OINTMENT | Freq: Two times a day (BID) | CUTANEOUS | 1 refills | Status: DC | PRN

## 2017-09-20 MED ORDER — TRIAMCINOLONE ACETONIDE 0.1 % EX CREA: 1.0000 "application " | TOPICAL_CREAM | Freq: Two times a day (BID) | CUTANEOUS | 2 refills | Status: DC | PRN

## 2017-09-20 NOTE — Assessment & Plan Note (Signed)
Plan to refer to orthopedic surgery for further evaluation.

## 2017-09-20 NOTE — Assessment & Plan Note (Signed)
Well-controlled with topical treatments.

## 2017-09-20 NOTE — Assessment & Plan Note (Signed)
Refer to podiatry

## 2017-09-20 NOTE — Assessment & Plan Note (Signed)
Physical exam completed.  Up-to-date on health maintenance.  Breast exam previously completed.  No pelvic exam given age and status post hysterectomy.  Encourage diet and exercise.  Lab work as outlined below.

## 2017-09-20 NOTE — Patient Instructions (Signed)
Nice to see you. We will get you to see podiatry as well as a surgeon to discuss her "scoliosis. We will contact you with your lab results.

## 2017-09-20 NOTE — Progress Notes (Signed)
Lindsay Rumps, MD Phone: [phone]  Lindsay Tran is a 72 y.o. female who presents today for cpe.  Not exercising much relating to pain from scoliosis. Eats very healthy with low-carb diet. Colonoscopy up-to-date. Mammogram up-to-date.  Notes the breast pain she had previously has resolved. Tetanus vaccination up-to-date. Hepatitis C up-to-date. Pneumonia vaccination and flu vaccination up-to-date. DEXA scan up-to-date. She status post hysterectomy for noncancerous reason.  No abnormal Pap smears per her report. No tobacco use, alcohol use, or illicit drug use.  She saw physical therapy for scoliosis.  Notes it did not really help.  Still has some discomfort in her back related to this that has been chronic and unchanged.  They did suggest she see a podiatrist for her flat feet.  Additionally has a bunion with hallux valgus on the right foot with overlying second toe.  Psoriasis: Was previously seeing dermatology though has not seen them recently.  She has been using topical treatments with good benefit.  Notes this is well controlled.  Active Ambulatory Problems    Diagnosis Date Noted  . Hyperlipidemia 07/27/2015  . Benign essential HTN 07/27/2015  . Adjustment disorder with anxiety 12/27/2006  . Chronic diarrhea 12/17/2014  . CAD in native artery 09/06/2012  . Acid reflux 09/06/2012  . Family history of colon cancer 12/18/2014  . Gastroesophageal hernia 05/22/2013  . Rash and nonspecific skin eruption 07/27/2015  . Abdominal pain 08/03/2015  . BPPV (benign paroxysmal positional vertigo) 12/24/2015  . Encounter for general adult medical examination with abnormal findings 09/04/2016  . Pedal edema 09/04/2016  . Dysphagia 09/04/2016  . Chronic pain of right knee 09/04/2016  . Dysuria 09/20/2016  . Arthritis of left ankle 12/05/2016  . Atrophic vaginitis 12/05/2016  . Scoliosis 04/06/2017  . Fall 04/06/2017  . Breast pain, left 04/06/2017  . Macular degeneration  04/06/2017  . Psoriasis 09/20/2017  . Flat foot 09/20/2017   Resolved Ambulatory Problems    Diagnosis Date Noted  . Need for Tdap vaccination 07/27/2015  . Encounter to establish care 07/27/2015  . Suprapubic pain 08/03/2015  . UTI (urinary tract infection) 02/17/2016   Past Medical History:  Diagnosis Date  . Abdominal pain   . Anemia   . Anxiety   . Arthritis   . BPPV (benign paroxysmal positional vertigo)   . Cataract   . Chronic diarrhea   . Complication of anesthesia   . Coronary artery disease   . Dysphasia   . Gastric ulcer 1980's  . GERD (gastroesophageal reflux disease)   . High cholesterol   . Hypertension   . Osteoporosis   . Schatzki's ring 05/02/2013  . Scoliosis     Family History  Problem Relation Age of Onset  . Diabetes Mother   . Parkinson's disease Mother   . Cancer Father        Pancreatic  . Cancer Sister        Lung  . Diabetes Sister   . Alzheimer's disease Maternal Aunt   . Alzheimer's disease Paternal Aunt   . Diabetes Sister   . Alzheimer's disease Sister   . Tics Sister   . Breast cancer Neg Hx     Social History   Socioeconomic History  . Marital status: Single    Spouse name: Not on file  . Number of children: Not on file  . Years of education: Not on file  . Highest education level: Not on file  Social Needs  . Financial resource strain: Not on file  .  Food insecurity - worry: Not on file  . Food insecurity - inability: Not on file  . Transportation needs - medical: Not on file  . Transportation needs - non-medical: Not on file  Occupational History  . Not on file  Tobacco Use  . Smoking status: Never Smoker  . Smokeless tobacco: Never Used  Substance and Sexual Activity  . Alcohol use: No    Alcohol/week: 0.0 oz  . Drug use: No  . Sexual activity: No  Other Topics Concern  . 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 CV:  Negative for chest pain, dyspnea, edema, palpitations Resp: Negative for cough, dyspnea, hemoptysis GI: Negative for nausea, vomiting, diarrhea, constipation, abdominal pain, melena, hematochezia. GU: Negative for dysuria, incontinence, urinary hesitance, hematuria, vaginal or penile discharge, polyuria, sexual difficulty, lumps in testicle or breasts MSK: Negative for muscle cramps or aches, joint pain or swelling Neuro: Negative for headaches, weakness, numbness, dizziness, passing out/fainting Psych: Negative for depression, anxiety, memory problems  Objective  Physical Exam Vitals:   09/20/17 1059  BP: 106/66  Pulse: 60  Temp: (!) 97.4 F (36.3 C)  SpO2: 94%    BP Readings from Last 3 Encounters:  09/20/17 106/66  06/15/17 125/70  04/16/17 132/70   Wt Readings from Last 3 Encounters:  09/20/17 135 lb 9.6 oz (61.5 kg)  06/15/17 144 lb 6.4 oz (65.5 kg)  04/16/17 151 lb (68.5 kg)    Physical Exam  Constitutional: No distress.  HENT:  Head: Normocephalic and atraumatic.  Mouth/Throat: Oropharynx is clear and moist. No oropharyngeal exudate.  Eyes: Conjunctivae are normal. Pupils are equal, round, and reactive to light.  Neck: Neck supple.  Cardiovascular: Normal rate, regular rhythm and normal heart sounds.  Pulmonary/Chest: Effort normal and breath sounds normal.  Abdominal: Soft. Bowel sounds are normal. She exhibits no distension. There is no tenderness. There is no rebound and no guarding.  Musculoskeletal: She exhibits no edema.  Lymphadenopathy:    She has no cervical adenopathy.  Neurological: She is alert. Gait normal.  Skin: Skin is warm and dry. She is not diaphoretic.  Psychiatric: Mood and affect normal.  Pes planus bilaterally, right  foot with hallux valgus and bunion with overriding second toe, 2+ DP pulses bilaterally   Assessment/Plan:   Encounter for general adult medical examination with abnormal findings Physical exam completed.  Up-to-date on health maintenance.  Breast exam previously completed.  No pelvic exam given age and status post hysterectomy.  Encourage diet and exercise.  Lab work as outlined below.  Scoliosis Plan to refer to orthopedic surgery for further evaluation.  Psoriasis Well-controlled with topical treatments.  Flat foot Refer to podiatry.   Orders Placed This Encounter  Procedures  . Lipid panel  . Comp Met (CMET)  . CBC w/Diff  . Iron, TIBC and Ferritin Panel  . HgB A1c  . Ambulatory referral to Podiatry    Referral Priority:   Routine    Referral Type:   Consultation    Referral Reason:   Specialty Services Required    Requested Specialty:   Podiatry    Number of Visits Requested:   1  .  Ambulatory referral to Orthopedic Surgery    Referral Priority:   Routine    Referral Type:   Surgical    Referral Reason:   Specialty Services Required    Requested Specialty:   Orthopedic Surgery    Number of Visits Requested:   1    Meds ordered this encounter  Medications  . clobetasol ointment (TEMOVATE) 0.05 %    Sig: Apply 1 application topically 2 (two) times daily as needed. For rash. Avoid face.    Dispense:  180 g    Refill:  1    3 tubes for a 3 month supply  . triamcinolone cream (KENALOG) 0.1 %    Sig: Apply 1 application topically 2 (two) times daily as needed. For rash. Avoid face.    Dispense:  80 g    Refill:  2     Lindsay Rumps, MD [place]

## 2017-09-21 ENCOUNTER — Telehealth: Payer: Self-pay

## 2017-09-21 DIAGNOSIS — R945 Abnormal results of liver function studies: Principal | ICD-10-CM

## 2017-09-21 DIAGNOSIS — R7989 Other specified abnormal findings of blood chemistry: Secondary | ICD-10-CM

## 2017-09-21 NOTE — Telephone Encounter (Signed)
-----   Message from Leone Haven, MD sent at 09/20/2017  2:24 PM EDT ----- Please let the patient know that her cholesterol is well controlled.  Her liver function tests are slightly elevated and I would like to recheck these in 3-4 weeks.  You can place an order for a hepatic function panel for elevated LFTs.  Her A1c is now in the normal range.  Her other lab work is acceptable.  Please also let her know I placed a referral to a orthopedic surgeon in [place] for her scoliosis.  Thanks.

## 2017-09-25 DIAGNOSIS — M546 Pain in thoracic spine: Secondary | ICD-10-CM | POA: Diagnosis not present

## 2017-09-25 DIAGNOSIS — H353131 Nonexudative age-related macular degeneration, bilateral, early dry stage: Secondary | ICD-10-CM | POA: Diagnosis not present

## 2017-09-25 DIAGNOSIS — M545 Low back pain: Secondary | ICD-10-CM | POA: Diagnosis not present

## 2017-09-25 DIAGNOSIS — M542 Cervicalgia: Secondary | ICD-10-CM | POA: Diagnosis not present

## 2017-10-01 DIAGNOSIS — M4004 Postural kyphosis, thoracic region: Secondary | ICD-10-CM | POA: Diagnosis not present

## 2017-10-01 DIAGNOSIS — M546 Pain in thoracic spine: Secondary | ICD-10-CM | POA: Diagnosis not present

## 2017-10-01 DIAGNOSIS — M4130 Thoracogenic scoliosis, site unspecified: Secondary | ICD-10-CM | POA: Diagnosis not present

## 2017-10-01 DIAGNOSIS — M545 Low back pain: Secondary | ICD-10-CM | POA: Diagnosis not present

## 2017-10-02 ENCOUNTER — Ambulatory Visit (INDEPENDENT_AMBULATORY_CARE_PROVIDER_SITE_OTHER): Payer: PPO

## 2017-10-02 VITALS — BP 106/64 | HR 62 | Temp 98.2°F | Resp 14 | Ht 60.5 in | Wt 132.8 lb

## 2017-10-02 DIAGNOSIS — Z0000 Encounter for general adult medical examination without abnormal findings: Secondary | ICD-10-CM | POA: Diagnosis not present

## 2017-10-02 NOTE — Patient Instructions (Addendum)
  Ms. Jaskolski , Thank you for taking time to come for your Medicare Wellness Visit. I appreciate your ongoing commitment to your health goals. Please review the following plan we discussed and let me know if I can assist you in the future.   Follow up as needed.    Have a great day!  These are the goals we discussed: Goals    . Increase physical activity     Walk for exercise Ride the exercise bike  150 minutes per week       This is a list of the screening recommended for you and due dates:  Health Maintenance  Topic Date Due  . Mammogram  04/19/2019  . Colon Cancer Screening  11/11/2024  . Tetanus Vaccine  07/20/2025  . Flu Shot  Completed  . DEXA scan (bone density measurement)  Completed  .  Hepatitis C: One time screening is recommended by Center for Disease Control  (CDC) for  adults born from [birth year] through 1967.   Completed  . Pneumonia vaccines  Completed

## 2017-10-02 NOTE — Progress Notes (Signed)
Subjective:   Lindsay Tran is a 72 y.o. female who presents for Medicare Annual (Subsequent) preventive examination.  Review of Systems:  No ROS.  Medicare Wellness Visit. Additional risk factors are reflected in the social history. Cardiac Risk Factors include: advanced age (>52men, >50 women);hypertension     Objective:     Vitals: BP 106/64 (BP Location: Left Arm, Patient Position: Sitting, Cuff Size: Normal)   Pulse 62   Temp 98.2 F (36.8 C) (Oral)   Resp 14   Ht 5' 0.5" (1.537 m)   Wt 132 lb 12.8 oz (60.2 kg)   SpO2 97%   BMI 25.51 kg/m   Body mass index is 25.51 kg/m.  Advanced Directives 10/02/2017 06/15/2017 05/01/2017 04/16/2017 02/13/2017 10/17/2016 09/29/2016  Does Patient Have a Medical Advance Directive? Yes Yes No No Yes Yes Yes  Type of Paramedic of [place];Living will [place];Living will - - [place];Living will Living will;Healthcare Power of [place];Living will  Does patient want to make changes to medical advance directive? No - Patient declined - - - No - Patient declined - No - Patient declined  Copy of [place] in Chart? Yes Yes - - No - copy requested - No - copy requested    Tobacco Social History   Tobacco Use  Smoking Status Never Smoker  Smokeless Tobacco Never Used     Counseling given: Not Answered   Clinical Intake:  Pre-visit preparation completed: Yes  Pain : No/denies pain     Nutritional Status: BMI 25 -29 Overweight Diabetes: No  How often do you need to have someone help you when you read instructions, pamphlets, or other written materials from your doctor or pharmacy?: 1 - Never  Interpreter Needed?: No     Past Medical History:  Diagnosis Date  . Abdominal pain   . Anemia   . Anxiety    adjustment disorder with anxiety  . Arthritis   . BPPV (benign paroxysmal positional vertigo)   . Cataract   .  Chronic diarrhea   . Complication of anesthesia   . Coronary artery disease   . Dysphasia   . Gastric ulcer 1980's  . GERD (gastroesophageal reflux disease)   . High cholesterol   . Hypertension   . Osteoporosis   . Schatzki's ring 05/02/2013  . Scoliosis    Past Surgical History:  Procedure Laterality Date  . ABDOMINAL HYSTERECTOMY    . CARDIAC CATHETERIZATION    . CHOLECYSTECTOMY    . COLONOSCOPY    . COLONOSCOPY N/A 11/12/2014   Procedure: COLONOSCOPY;  Surgeon: Manya Silvas, MD;  Location[facility] ENDOSCOPY;  Service: Endoscopy;  Laterality: N/A;  . DIAGNOSTIC LAPAROSCOPY     paraesphogeal hernia  . ESOPHAGOGASTRODUODENOSCOPY    . ESOPHAGOGASTRODUODENOSCOPY (EGD) WITH PROPOFOL N/A 04/16/2017   Procedure: ESOPHAGOGASTRODUODENOSCOPY (EGD) WITH PROPOFOL;  Surgeon: Manya Silvas, MD;  Location: [facility] ENDOSCOPY;  Service: Endoscopy;  Laterality: N/A;  . HERNIA REPAIR     inguinal hernia   Family History  Problem Relation Age of Onset  . Diabetes Mother   . Parkinson's disease Mother   . Cancer Father        Pancreatic  . Cancer Sister        Lung  . Diabetes Sister   . Alzheimer's disease Paternal Aunt        x2 sisters  . Diabetes Sister   . Alzheimer's disease Sister   .  Tics Sister   . Breast cancer Neg Hx    Social History   Socioeconomic History  . Marital status: Single    Spouse name: Not on file  . Number of children: Not on file  . Years of education: Not on file  . Highest education level: Not on file  Occupational History  . Not on file  Social Needs  . Financial resource strain: Not hard at all  . Food insecurity:    Worry: Never true    Inability: Never true  . Transportation needs:    Medical: No    Non-medical: No  Tobacco Use  . Smoking status: Never Smoker  . Smokeless tobacco: Never Used  Substance and Sexual Activity  . Alcohol use: No    Alcohol/week: 0.0 oz  . Drug use: No  . Sexual activity: Never  Lifestyle  . Physical  activity:    Days per week: Not on file    Minutes per session: Not on file  . Stress: Not on file  Relationships  . Social connections:    Talks on phone: Not on file    Gets together: Not on file    Attends religious service: Not on file    Active member of club or organization: Not on file    Attends meetings of clubs or organizations: Not on file    Relationship status: Not on file  Other Topics Concern  . Not on file  Social History Narrative   Divorced   Retired as a Scientist, research (medical)   12 + years of school     Outpatient Encounter Medications as of 10/02/2017  Medication Sig  . aspirin 81 MG tablet Take 81 mg by mouth daily.  . cholestyramine light 4 G POWD Take by mouth 1 day or 1 dose.  . clobetasol ointment (TEMOVATE) 2.87 % Apply 1 application topically 2 (two) times daily as needed. For rash. Avoid face.  . conjugated estrogens (PREMARIN) vaginal cream Place 1 Applicatorful vaginally 2 (two) times a week. Dispense a 90 day supply.  . Cyanocobalamin (VITAMIN B 12 PO) Take by mouth.  . fenofibrate 160 MG tablet TAKE ONE TABLET BY MOUTH ONCE DAILY  . ferrous sulfate 325 (65 FE) MG tablet Take 325 mg by mouth 2 (two) times daily with a meal.  . ketoconazole (NIZORAL) 2 % cream Apply 1 application topically daily as needed.   . meclizine (ANTIVERT) 25 MG tablet Take 1 tablet (25 mg total) by mouth 2 (two) times daily as needed for dizziness.  . metoprolol tartrate (LOPRESSOR) 100 MG tablet TAKE ONE TABLET BY MOUTH TWICE DAILY  . Misc Natural Products (OSTEO BI-FLEX/5-LOXIN ADVANCED) TABS Take 1 tablet by mouth 2 (two) times daily.  . Multiple Vitamins-Minerals (PRESERVISION AREDS 2 PO) Take by mouth.  . mupirocin ointment (BACTROBAN) 2 % Place 1 application into the nose 2 (two) times daily.  . naproxen sodium (ANAPROX) 220 MG tablet Take 220 mg by mouth daily. Sometimes pt takes twice a day if needed from pain  . Omega-3 Fatty Acids (OMEGA 3 500 PO) Take 1 capsule by mouth  daily.   . pantoprazole (PROTONIX) 40 MG tablet Take 1 tablet (40 mg total) by mouth daily.  . potassium chloride (K-DUR) 10 MEQ tablet Take 10 mEq by mouth daily.  . Probiotic Product (PROBIOTIC DAILY) CAPS Take 1 capsule by mouth.  . simvastatin (ZOCOR) 40 MG tablet TAKE ONE TABLET BY MOUTH ONCE DAILY AT  6PM  . triamcinolone cream (  KENALOG) 0.1 % Apply 1 application topically 2 (two) times daily as needed. For rash. Avoid face.   No facility-administered encounter medications on file as of 10/02/2017.     Activities of Daily Living In your present state of health, do you have any difficulty performing the following activities: 10/02/2017  Hearing? N  Vision? N  Difficulty concentrating or making decisions? N  Walking or climbing stairs? Y  Comment self pace.  knee pain.  Dressing or bathing? N  Comment self pace  Doing errands, shopping? Y  Comment she drives short distances only  Conservation officer, nature and eating ? N  Using the Toilet? N  In the past six months, have you accidently leaked urine? N  Do you have problems with loss of bowel control? N  Managing your Medications? N  Managing your Finances? N  Housekeeping or managing your Housekeeping? Y  Comment daughter and son assists  Some recent data might be hidden    Patient Care Team: Leone Haven, MD as PCP - General (Family Medicine)    Assessment:   This is a routine wellness examination for Sammantha.  The goal of the wellness visit is to assist the patient how to close the gaps in care and create a preventative care plan for the patient.   The roster of all physicians providing medical care to patient is listed in the Snapshot section of the chart.  Taking calcium VIT D as appropriate/Osteoporosis reviewed.    Safety issues reviewed; Smoke and carbon monoxide detectors in the home. No firearms in the home. Wears seatbelts when driving or riding with others. No violence in the home.  They do not have excessive sun  exposure.  Discussed the need for sun protection: hats, long sleeves and the use of sunscreen if there is significant sun exposure.  Patient is alert, normal appearance, oriented to person/place/and time.  Correctly identified the president of the [place] and recalls of 3/3 words. Performs simple calculations and can read correct time from watch face. Displays appropriate judgement.  No new identified risk were noted.  No failures at ADL's or IADL's.   BMI- discussed the importance of a healthy diet, water intake and the benefits of aerobic exercise. Educational material provided.   24 hour diet recall: Low carb/low sugar diet  Daily fluid intake: 2 cups of caffeine, 3-4 cups of water  Dental- every 6 months.  Eye- Visual acuity not assessed per patient preference since they have regular follow up with the ophthalmologist.  Wears corrective lenses.  Sleep patterns- Sleeps 6-7 hours at night.  Wakes feeling rested.   Health maintenance gaps- closed.  Patient Concerns: None at this time. Follow up with PCP as needed.  Exercise Activities and Dietary recommendations Current Exercise Habits: Structured exercise class, Time (Minutes): 60, Frequency (Times/Week): 2, Weekly Exercise (Minutes/Week): 120, Intensity: Mild  Goals    . Increase physical activity     Walk for exercise Ride the exercise bike  150 minutes per week       Fall Risk Fall Risk  10/02/2017 09/29/2016 09/30/2015 07/21/2015  Falls in the past year? No No No No   Depression Screen PHQ 2/9 Scores 10/02/2017 09/29/2016 09/30/2015 07/21/2015  PHQ - 2 Score 0 0 0 0     Cognitive Function MMSE - Mini Mental State Exam 09/29/2016 09/30/2015  Orientation to time 5 5  Orientation to Place 5 5  Registration 3 3  Attention/ Calculation 5 5  Recall 3 3  Language-  name 2 objects 2 2  Language- repeat 1 1  Language- follow 3 step command 3 3  Language- read & follow direction 1 1  Write a sentence 1 1  Copy design 1 1    Total score 30 30     6CIT Screen 10/02/2017  What Year? 0 points  What month? 0 points  What time? 0 points  Count back from 20 0 points  Months in reverse 0 points  Repeat phrase 0 points  Total Score 0    Immunization History  Administered Date(s) Administered  . Influenza, High Dose Seasonal PF 03/31/2016, 04/06/2017  . Influenza-Unspecified 04/14/2015  . Pneumococcal Conjugate-13 09/30/2015  . Pneumococcal-Unspecified 07/17/2011  . Tdap 07/21/2015  . Zoster 08/17/2011   Screening Tests Health Maintenance  Topic Date Due  . MAMMOGRAM  04/19/2019  . COLONOSCOPY  11/11/2024  . TETANUS/TDAP  07/20/2025  . INFLUENZA VACCINE  Completed  . DEXA SCAN  Completed  . Hepatitis C Screening  Completed  . PNA vac Low Risk Adult  Completed      Plan:    End of life planning; Advance aging; Advanced directives discussed. Copy of current HCPOA/Living Will on file.    I have personally reviewed and noted the following in the patient's chart:   . Medical and social history . Use of alcohol, tobacco or illicit drugs  . Current medications and supplements . Functional ability and status . Nutritional status . Physical activity . Advanced directives . List of other physicians . Hospitalizations, surgeries, and ER visits in previous 12 months . Vitals . Screenings to include cognitive, depression, and falls . Referrals and appointments  In addition, I have reviewed and discussed with patient certain preventive protocols, quality metrics, and best practice recommendations. A written personalized care plan for preventive services as well as general preventive health recommendations were provided to patient.     Varney Biles, LPN  07/24/7260

## 2017-10-03 DIAGNOSIS — M545 Low back pain: Secondary | ICD-10-CM | POA: Diagnosis not present

## 2017-10-03 DIAGNOSIS — M4004 Postural kyphosis, thoracic region: Secondary | ICD-10-CM | POA: Diagnosis not present

## 2017-10-03 DIAGNOSIS — M4130 Thoracogenic scoliosis, site unspecified: Secondary | ICD-10-CM | POA: Diagnosis not present

## 2017-10-03 DIAGNOSIS — M546 Pain in thoracic spine: Secondary | ICD-10-CM | POA: Diagnosis not present

## 2017-10-08 DIAGNOSIS — M4130 Thoracogenic scoliosis, site unspecified: Secondary | ICD-10-CM | POA: Diagnosis not present

## 2017-10-08 DIAGNOSIS — M4004 Postural kyphosis, thoracic region: Secondary | ICD-10-CM | POA: Diagnosis not present

## 2017-10-08 DIAGNOSIS — M546 Pain in thoracic spine: Secondary | ICD-10-CM | POA: Diagnosis not present

## 2017-10-08 DIAGNOSIS — M545 Low back pain: Secondary | ICD-10-CM | POA: Diagnosis not present

## 2017-10-10 DIAGNOSIS — M545 Low back pain: Secondary | ICD-10-CM | POA: Diagnosis not present

## 2017-10-10 DIAGNOSIS — M4004 Postural kyphosis, thoracic region: Secondary | ICD-10-CM | POA: Diagnosis not present

## 2017-10-10 DIAGNOSIS — M546 Pain in thoracic spine: Secondary | ICD-10-CM | POA: Diagnosis not present

## 2017-10-10 DIAGNOSIS — M4130 Thoracogenic scoliosis, site unspecified: Secondary | ICD-10-CM | POA: Diagnosis not present

## 2017-10-12 ENCOUNTER — Ambulatory Visit (INDEPENDENT_AMBULATORY_CARE_PROVIDER_SITE_OTHER): Payer: PPO

## 2017-10-12 ENCOUNTER — Telehealth: Payer: Self-pay | Admitting: Family Medicine

## 2017-10-12 ENCOUNTER — Encounter: Payer: Self-pay | Admitting: Podiatry

## 2017-10-12 ENCOUNTER — Ambulatory Visit: Payer: PPO | Admitting: Podiatry

## 2017-10-12 DIAGNOSIS — M21611 Bunion of right foot: Secondary | ICD-10-CM | POA: Diagnosis not present

## 2017-10-12 DIAGNOSIS — M2140 Flat foot [pes planus] (acquired), unspecified foot: Secondary | ICD-10-CM

## 2017-10-12 NOTE — Telephone Encounter (Signed)
Copied from [place] [id number]. Topic: Quick Communication - See Telephone Encounter >> Oct 12, 2017  4:32 PM Vernona Rieger wrote: CRM for notification. See Telephone encounter for: 10/12/17.  Patient said that Dr Caryl Bis told her that he knew someone that could give her a cortizone shot. She wants to know if she can go ahead and move forward with that? Call back is [phone]

## 2017-10-12 NOTE — Telephone Encounter (Signed)
Please advise 

## 2017-10-12 NOTE — Telephone Encounter (Signed)
I placed  a referral to orthopedics previously for her back.  Please see if she has been contacted about that.  Please also determine if the steroid injection would be for her knees or if it would be for her back.  Thanks.

## 2017-10-15 DIAGNOSIS — M4004 Postural kyphosis, thoracic region: Secondary | ICD-10-CM | POA: Diagnosis not present

## 2017-10-15 DIAGNOSIS — M4130 Thoracogenic scoliosis, site unspecified: Secondary | ICD-10-CM | POA: Diagnosis not present

## 2017-10-15 DIAGNOSIS — M546 Pain in thoracic spine: Secondary | ICD-10-CM | POA: Diagnosis not present

## 2017-10-15 DIAGNOSIS — M545 Low back pain: Secondary | ICD-10-CM | POA: Diagnosis not present

## 2017-10-15 NOTE — Telephone Encounter (Signed)
Patient states she did have an appointment with them and they referred her to physical therapy but it is not helping. The injection would be for her back near her shoulder blade.

## 2017-10-15 NOTE — Telephone Encounter (Signed)
Noted.  She should contact the surgeon and see if they are able to do injections through their office.  If not we could refer her to physical medicine and rehab.  Thanks.

## 2017-10-15 NOTE — Telephone Encounter (Signed)
Patient notified

## 2017-10-17 NOTE — Progress Notes (Signed)
   Subjective: 72 year old female presenting today as a new patient with a chief complaint of pain to the bilateral feet that has been ongoing for years. She reports a painful bunion of the right foot and bilateral pes planus. She states her right second toe is crossing over the great toe and is bothersome. Walking and bearing weight increases her pain. She has been wearing arch supports for treatment with minimal relief. Patient is here for further evaluation and treatment.    Past Medical History:  Diagnosis Date  . Abdominal pain   . Anemia   . Anxiety    adjustment disorder with anxiety  . Arthritis   . BPPV (benign paroxysmal positional vertigo)   . Cataract   . Chronic diarrhea   . Complication of anesthesia   . Coronary artery disease   . Dysphasia   . Gastric ulcer 1980's  . GERD (gastroesophageal reflux disease)   . High cholesterol   . Hypertension   . Osteoporosis   . Schatzki's ring 05/02/2013  . Scoliosis       Objective: Physical Exam General: The patient is alert and oriented x3 in no acute distress.  Dermatology: Skin is cool, dry and supple bilateral lower extremities. Negative for open lesions or macerations.  Vascular: Palpable pedal pulses bilaterally. No edema or erythema noted. Capillary refill within normal limits.  Neurological: Epicritic and protective threshold grossly intact bilaterally.   Musculoskeletal Exam: Clinical evidence of bunion deformity noted to the respective foot. There is a moderate pain on palpation range of motion of the first MPJ. Lateral deviation of the hallux noted consistent with hallux abductovalgus.  Upon weightbearing there is a medial longitudinal arch collapse bilaterally. Remove foot valgus noted to the bilateral lower extremities with excessive pronation upon mid stance.  Radiographic Exam:  Normal osseous mineralization. Joint spaces preserved. No fracture/dislocation/boney destruction.   Pes planus noted on  radiographic exam lateral views. Decreased calcaneal inclination and metatarsal declination angle is noted. Anterior break in the cyma line noted on lateral views. Medial talar head to deviation noted on AP radiograph.  Increased intermetatarsal angle greater than 15 with a hallux abductus angle greater than 30 noted on AP view. Moderate degenerative changes noted within the first MPJ.  Assessment: 1. HAV w/ bunion deformity right 2. Pes planus bilateral     Plan of Care:  1. Patient was evaluated. X-Rays reviewed. 2. Today we discussed the conservative versus surgical management of bunion deformity. 3. Recommended OTC insoles.  4. Recommended good shoe gear.  5. Silicone toe spacer dispensed.  6. Return to clinic as needed.    Edrick Kins, DPM [facility]  Dr. Edrick Kins, [address]                                        [place], [place] [address]                Office [phone]  Fax [phone]

## 2017-10-18 ENCOUNTER — Telehealth: Payer: Self-pay

## 2017-10-18 ENCOUNTER — Other Ambulatory Visit (INDEPENDENT_AMBULATORY_CARE_PROVIDER_SITE_OTHER): Payer: PPO

## 2017-10-18 DIAGNOSIS — R945 Abnormal results of liver function studies: Secondary | ICD-10-CM | POA: Diagnosis not present

## 2017-10-18 DIAGNOSIS — R7989 Other specified abnormal findings of blood chemistry: Secondary | ICD-10-CM

## 2017-10-18 LAB — HEPATIC FUNCTION PANEL
ALBUMIN: 3.7 g/dL (ref 3.5–5.2)
ALT: 27 U/L (ref 0–35)
AST: 41 U/L — ABNORMAL HIGH (ref 0–37)
Alkaline Phosphatase: 39 U/L (ref 39–117)
Bilirubin, Direct: 0.3 mg/dL (ref 0.0–0.3)
TOTAL PROTEIN: 6.4 g/dL (ref 6.0–8.3)
Total Bilirubin: 0.8 mg/dL (ref 0.2–1.2)

## 2017-10-18 NOTE — Telephone Encounter (Signed)
-----   Message from Leone Haven, MD sent at 10/18/2017  2:44 PM EDT ----- Please let the patient know that her liver function tests have improved from prior.  I would like to recheck these in another month and if they are normal no further evaluation will be needed.  Please place an order for a hepatic function panel for elevated LFTs.  Thanks.

## 2017-10-23 ENCOUNTER — Other Ambulatory Visit: Payer: Self-pay | Admitting: Family Medicine

## 2017-10-23 DIAGNOSIS — R945 Abnormal results of liver function studies: Principal | ICD-10-CM

## 2017-10-23 DIAGNOSIS — R7989 Other specified abnormal findings of blood chemistry: Secondary | ICD-10-CM

## 2017-11-07 DIAGNOSIS — M40209 Unspecified kyphosis, site unspecified: Secondary | ICD-10-CM | POA: Diagnosis not present

## 2017-11-07 DIAGNOSIS — M25511 Pain in right shoulder: Secondary | ICD-10-CM | POA: Diagnosis not present

## 2017-11-07 DIAGNOSIS — M4120 Other idiopathic scoliosis, site unspecified: Secondary | ICD-10-CM | POA: Diagnosis not present

## 2017-11-10 DIAGNOSIS — M4004 Postural kyphosis, thoracic region: Secondary | ICD-10-CM | POA: Diagnosis not present

## 2017-11-15 DIAGNOSIS — M4120 Other idiopathic scoliosis, site unspecified: Secondary | ICD-10-CM | POA: Diagnosis not present

## 2017-11-15 DIAGNOSIS — M40209 Unspecified kyphosis, site unspecified: Secondary | ICD-10-CM | POA: Diagnosis not present

## 2017-11-15 DIAGNOSIS — M25511 Pain in right shoulder: Secondary | ICD-10-CM | POA: Diagnosis not present

## 2017-11-19 ENCOUNTER — Other Ambulatory Visit (INDEPENDENT_AMBULATORY_CARE_PROVIDER_SITE_OTHER): Payer: PPO

## 2017-11-19 DIAGNOSIS — R7989 Other specified abnormal findings of blood chemistry: Secondary | ICD-10-CM

## 2017-11-19 DIAGNOSIS — R945 Abnormal results of liver function studies: Secondary | ICD-10-CM

## 2017-11-19 LAB — HEPATIC FUNCTION PANEL
ALBUMIN: 3.5 g/dL (ref 3.5–5.2)
ALT: 20 U/L (ref 0–35)
AST: 29 U/L (ref 0–37)
Alkaline Phosphatase: 34 U/L — ABNORMAL LOW (ref 39–117)
BILIRUBIN DIRECT: 0.3 mg/dL (ref 0.0–0.3)
TOTAL PROTEIN: 6.3 g/dL (ref 6.0–8.3)
Total Bilirubin: 0.7 mg/dL (ref 0.2–1.2)

## 2017-11-21 DIAGNOSIS — M4120 Other idiopathic scoliosis, site unspecified: Secondary | ICD-10-CM | POA: Diagnosis not present

## 2017-11-21 DIAGNOSIS — M25511 Pain in right shoulder: Secondary | ICD-10-CM | POA: Diagnosis not present

## 2017-11-21 DIAGNOSIS — M40209 Unspecified kyphosis, site unspecified: Secondary | ICD-10-CM | POA: Diagnosis not present

## 2017-11-28 DIAGNOSIS — M4120 Other idiopathic scoliosis, site unspecified: Secondary | ICD-10-CM | POA: Diagnosis not present

## 2017-11-28 DIAGNOSIS — M40209 Unspecified kyphosis, site unspecified: Secondary | ICD-10-CM | POA: Diagnosis not present

## 2017-11-28 DIAGNOSIS — M25511 Pain in right shoulder: Secondary | ICD-10-CM | POA: Diagnosis not present

## 2017-12-06 DIAGNOSIS — M25511 Pain in right shoulder: Secondary | ICD-10-CM | POA: Diagnosis not present

## 2017-12-06 DIAGNOSIS — M4120 Other idiopathic scoliosis, site unspecified: Secondary | ICD-10-CM | POA: Diagnosis not present

## 2017-12-06 DIAGNOSIS — M40209 Unspecified kyphosis, site unspecified: Secondary | ICD-10-CM | POA: Diagnosis not present

## 2017-12-12 DIAGNOSIS — M4120 Other idiopathic scoliosis, site unspecified: Secondary | ICD-10-CM | POA: Diagnosis not present

## 2017-12-12 DIAGNOSIS — M25511 Pain in right shoulder: Secondary | ICD-10-CM | POA: Diagnosis not present

## 2017-12-12 DIAGNOSIS — M40209 Unspecified kyphosis, site unspecified: Secondary | ICD-10-CM | POA: Diagnosis not present

## 2017-12-14 ENCOUNTER — Other Ambulatory Visit: Payer: Self-pay

## 2017-12-14 ENCOUNTER — Inpatient Hospital Stay: Payer: PPO | Attending: Oncology

## 2017-12-14 DIAGNOSIS — D473 Essential (hemorrhagic) thrombocythemia: Secondary | ICD-10-CM | POA: Insufficient documentation

## 2017-12-14 DIAGNOSIS — D75839 Thrombocytosis, unspecified: Secondary | ICD-10-CM

## 2017-12-14 DIAGNOSIS — D509 Iron deficiency anemia, unspecified: Secondary | ICD-10-CM | POA: Diagnosis not present

## 2017-12-14 LAB — CBC WITH DIFFERENTIAL/PLATELET
BASOS ABS: 0 10*3/uL (ref 0–0.1)
BASOS PCT: 1 %
EOS ABS: 0.3 10*3/uL (ref 0–0.7)
Eosinophils Relative: 6 %
HEMATOCRIT: 35 % (ref 35.0–47.0)
HEMOGLOBIN: 11.9 g/dL — AB (ref 12.0–16.0)
Lymphocytes Relative: 24 %
Lymphs Abs: 1.1 10*3/uL (ref 1.0–3.6)
MCH: 30.5 pg (ref 26.0–34.0)
MCHC: 34 g/dL (ref 32.0–36.0)
MCV: 89.8 fL (ref 80.0–100.0)
Monocytes Absolute: 0.5 10*3/uL (ref 0.2–0.9)
Monocytes Relative: 10 %
NEUTROS ABS: 2.7 10*3/uL (ref 1.4–6.5)
NEUTROS PCT: 59 %
Platelets: 347 10*3/uL (ref 150–440)
RBC: 3.9 MIL/uL (ref 3.80–5.20)
RDW: 13.8 % (ref 11.5–14.5)
WBC: 4.7 10*3/uL (ref 3.6–11.0)

## 2017-12-14 LAB — IRON AND TIBC
Iron: 99 ug/dL (ref 28–170)
SATURATION RATIOS: 26 % (ref 10.4–31.8)
TIBC: 380 ug/dL (ref 250–450)
UIBC: 281 ug/dL

## 2017-12-14 LAB — FERRITIN: FERRITIN: 230 ng/mL (ref 11–307)

## 2017-12-28 ENCOUNTER — Telehealth: Payer: Self-pay

## 2017-12-28 DIAGNOSIS — M545 Low back pain: Secondary | ICD-10-CM | POA: Diagnosis not present

## 2017-12-28 DIAGNOSIS — M546 Pain in thoracic spine: Secondary | ICD-10-CM | POA: Diagnosis not present

## 2017-12-28 DIAGNOSIS — M542 Cervicalgia: Secondary | ICD-10-CM | POA: Diagnosis not present

## 2017-12-28 NOTE — Telephone Encounter (Signed)
Patient called back, she did request the back brace.

## 2017-12-28 NOTE — Telephone Encounter (Signed)
I did not see this in my folder.

## 2017-12-28 NOTE — Telephone Encounter (Signed)
Left message to return call, ok for pec to speak to patient to ask her if she requested a back brace from [facility]. We received a form regarding.

## 2017-12-28 NOTE — Telephone Encounter (Signed)
fyi placed in red folder

## 2017-12-31 NOTE — Telephone Encounter (Signed)
Placed in your red folder.

## 2018-01-02 ENCOUNTER — Telehealth: Payer: Self-pay | Admitting: Family Medicine

## 2018-01-02 ENCOUNTER — Telehealth: Payer: Self-pay

## 2018-01-02 NOTE — Telephone Encounter (Signed)
Letter completed. Please fax.

## 2018-01-02 NOTE — Telephone Encounter (Unsigned)
Copied from [place] #[id number]. >> Jan 02, 2018  2:59 PM Carolyn Stare wrote:  Andee Poles with [facility] call to say she is giving info for company that will issue pt back [place]  Fax number [phone]

## 2018-01-02 NOTE — Telephone Encounter (Signed)
fyi

## 2018-01-02 NOTE — Telephone Encounter (Signed)
Copied from [place] [id number]. Topic: General - Other >> Jan 02, 2018  2:59 PM Carolyn Stare wrote:  Andee Poles with [facility] call to say she is giving info for company that will issue pt back [place]  Fax number [phone]

## 2018-01-03 DIAGNOSIS — M546 Pain in thoracic spine: Secondary | ICD-10-CM | POA: Diagnosis not present

## 2018-01-03 DIAGNOSIS — M545 Low back pain: Secondary | ICD-10-CM | POA: Diagnosis not present

## 2018-01-03 DIAGNOSIS — M542 Cervicalgia: Secondary | ICD-10-CM | POA: Diagnosis not present

## 2018-01-03 NOTE — Telephone Encounter (Signed)
faxed

## 2018-01-08 DIAGNOSIS — M546 Pain in thoracic spine: Secondary | ICD-10-CM | POA: Diagnosis not present

## 2018-01-08 DIAGNOSIS — M545 Low back pain: Secondary | ICD-10-CM | POA: Diagnosis not present

## 2018-01-08 DIAGNOSIS — M542 Cervicalgia: Secondary | ICD-10-CM | POA: Diagnosis not present

## 2018-01-15 DIAGNOSIS — M546 Pain in thoracic spine: Secondary | ICD-10-CM | POA: Diagnosis not present

## 2018-01-15 DIAGNOSIS — M545 Low back pain: Secondary | ICD-10-CM | POA: Diagnosis not present

## 2018-01-15 DIAGNOSIS — M542 Cervicalgia: Secondary | ICD-10-CM | POA: Diagnosis not present

## 2018-02-11 ENCOUNTER — Ambulatory Visit: Payer: Self-pay | Admitting: *Deleted

## 2018-02-11 ENCOUNTER — Other Ambulatory Visit: Payer: Self-pay

## 2018-02-11 NOTE — Telephone Encounter (Signed)
Called patient and advised her per Dr. Caryl Bis that she need to be evaluated at the ED today or at least and urgent care he doesn't feel that she should wait for appt with Dr. Aundra Dubin. Patient states that insurance wont cover her going to an urgent care. So I informed that she should go to the ED. Patient states that her legs has been like this for a few day she can wait until tomorrow. I informed her that event though it has been a few days due to symptoms she should be seen. Patient declined. She states that she will see Dr. Aundra Dubin tomorrow

## 2018-02-11 NOTE — Telephone Encounter (Signed)
Pt called with complaints left ankle/leg redness and swelling x 1 week; she says that leg is sore from her ankle to 2 inches below her knee; orginally described as broken blood vessels under the skin but that has gotten worse"; the pt says it is tender to touch and feels warm to the touch;she also states that she has tried elevating leg, ice to site, and taken ibuprofen with no change in symptoms; recommendations made per nurse triage protocol to include seeing a physician within 24 hours; the pt normally sees Dr Caryl Bis but he has no availability; pt offered and accepted appointment with Dr Tyler Aas, [place], 02/12/18 at 0930; she verbalizes understanding; will route to office for notification of this encounter.   Reason for Disposition . [1] MODERATE leg swelling (e.g., swelling extends up to knees) AND [2] new onset or worsening  Answer Assessment - Initial Assessment Questions 1. ONSET: "When did the swelling start?" (e.g., minutes, hours, days)     02/04/18 2. LOCATION: "What part of the leg is swollen?"  "Are both legs swollen or just one leg?"     Left leg ankle to 2 inches below knee 3. SEVERITY: "How bad is the swelling?" (e.g., localized; mild, moderate, severe)  - Localized - small area of swelling localized to one leg  - MILD pedal edema - swelling limited to foot and ankle, pitting edema < 1/4 inch (6 mm) deep, rest and elevation eliminate most or all swelling  - MODERATE edema - swelling of lower leg to knee, pitting edema > 1/4 inch (6 mm) deep, rest and elevation only partially reduce swelling  - SEVERE edema - swelling extends above knee, facial or hand swelling present      moderate 4. REDNESS: "Does the swelling look red or infected?"     Yes; looks like broken blood vessels under the skin 5. PAIN: "Is the swelling painful to touch?" If so, ask: "How painful is it?"   (Scale 1-10; mild, moderate or severe)     5 out of 10 6. FEVER: "Do you have a fever?" If so, ask:  "What is it, how was it measured, and when did it start?"      no 7. CAUSE: "What do you think is causing the leg swelling?"     unsure 8. MEDICAL HISTORY: "Do you have a history of heart failure, kidney disease, liver failure, or cancer?"   no   9. RECURRENT SYMPTOM: "Have you had leg swelling before?" If so, ask: "When was the last time?" "What happened that time?"     Yes; at baseline  10. OTHER SYMPTOMS: "Do you have any other symptoms?" (e.g., chest pain, difficulty breathing)       Tired; skin in area feels tight 11. PREGNANCY: "Is there any chance you are pregnant?" "When was your last menstrual period?"      no  Protocols used: LEG SWELLING AND EDEMA-A-AH

## 2018-02-12 ENCOUNTER — Ambulatory Visit
Admission: RE | Admit: 2018-02-12 | Discharge: 2018-02-12 | Disposition: A | Discharge status: Home / Self Care | Payer: PPO | Source: Ambulatory Visit | Attending: Internal Medicine | Admitting: Internal Medicine

## 2018-02-12 ENCOUNTER — Encounter: Payer: Self-pay | Admitting: Internal Medicine

## 2018-02-12 ENCOUNTER — Ambulatory Visit (INDEPENDENT_AMBULATORY_CARE_PROVIDER_SITE_OTHER): Payer: PPO | Admitting: Internal Medicine

## 2018-02-12 VITALS — BP 122/82 | HR 80 | Temp 97.7°F | Ht 60.5 in | Wt 124.6 lb

## 2018-02-12 DIAGNOSIS — R600 Localized edema: Secondary | ICD-10-CM | POA: Diagnosis not present

## 2018-02-12 DIAGNOSIS — I776 Arteritis, unspecified: Secondary | ICD-10-CM | POA: Diagnosis not present

## 2018-02-12 DIAGNOSIS — M79662 Pain in left lower leg: Secondary | ICD-10-CM | POA: Diagnosis not present

## 2018-02-12 DIAGNOSIS — M7989 Other specified soft tissue disorders: Secondary | ICD-10-CM | POA: Diagnosis not present

## 2018-02-12 DIAGNOSIS — M2550 Pain in unspecified joint: Secondary | ICD-10-CM

## 2018-02-12 LAB — SEDIMENTATION RATE: SED RATE: 13 mm/h (ref 0–30)

## 2018-02-12 LAB — C-REACTIVE PROTEIN: CRP: 0.6 mg/dL (ref 0.5–20.0)

## 2018-02-12 MED ORDER — PREDNISONE 20 MG PO TABS: ORAL_TABLET | ORAL | 0 refills | Status: DC

## 2018-02-12 NOTE — Patient Instructions (Signed)
Will refer dermatology and vascular  F/u as scheduled with Dr. Chauncey Cruel   Vasculitis Vasculitis is swelling (inflammation) of the blood vessels. With vasculitis, the blood vessels can become thick, narrow, scarred, or weak, and enough blood may not be able to flow through them. This can cause damage to the muscles, kidneys, lungs, brain, and other parts of the body. There are many types of vasculitis. Some last only a short time while others last a long time. What are the causes? The exact cause is unknown, but vasculitis can develop when the body's immune system attacks its own blood vessels. This attack can be caused by:  An infection.  An immune system disease, such as lupus, rheumatoid arthritis, or scleroderma.  An allergic reaction to a medicine.  Cancer that affects blood cells, such as leukemia and lymphoma.  What increases the risk?  Being a smoker.  Being under stress.  Having a physical injury. What are the signs or symptoms? Symptoms vary depending on the type of vasculitis you have. Symptoms that are common to all types of vasculitis include:  Fever.  Poor appetite.  Weight loss.  Feeling very tired.  Having aches and pains.  Weakness.  Numbness in an area of your body.  Symptoms for specific types of vasculitis include:  Skin problems, such as sores, spots, or rashes.  Trouble seeing.  Trouble breathing.  Blood in your urine.  Headaches.  Stomach pain.  Stuffy or bloody nose.  How is this diagnosed? Your health care provider will ask about your symptoms and do a physical exam. You may have tests done, such as:  A complete blood count (CBC).  Erythrocyte sedimentation, also called sed rate test.  C-reactive protein (CRP).  Antineutrophil cytoplasmic antibodies (ANCA).  A urine test.  A biopsy of a blood vessel.  A nerve conduction study.  Imaging tests, such as: ? X-rays. ? A CT scan. ? An ultrasound. ? An MRI. ? Angiography.  How  is this treated? Treatment will depend on the type of vasculitis you have and how severe it is. Sometimes treatment is not needed. Treatment often includes:  Medicines.  Physical therapy or occupational therapy. This helps strengthen muscles that were weakened by the disease.  You will need to see your health care provider while you are being treated. During follow-up visits, your health care provider will:  Perform blood tests and bone density tests.  Check your blood pressure and blood sugar.  Check for side effects of any medicines you are taking.  Vasculitis cannot always be cured. Sometimes symptoms go away but the disease does not (the disease goes in remission). If symptoms return, increased treatment may be needed. Follow these instructions at home:  Take medicines only as directed by your health care provider.  Keep all follow-up visits as directed by your health care provider. This is important.  Exercise. Talk with your health care provider about what exercises are okay for you to do. Usually exercises that increase your heart rate (aerobic exercise), such as walking, are recommended. Aerobic exercise helps control your blood pressure and prevent bone loss.  Follow a healthy diet. Include healthy sources of protein, fruits, vegetables, and whole grains in your diet.  Learn as much as you can about vasculitis, and consider joining a support group. Understanding your condition and talking with others who have it may help you cope. Talk with your health care provider if you feel stressed, anxious, or depressed. Contact a health care provider if:  Your symptoms return, or you have new symptoms.  Your fever, fatigue, headache, or weight loss gets worse.  You have signs of infection, such as fever, warmth, tenderness, redness, or swelling. Get help right away if:  Your vision gets worse.  Your pain does not go away, even after you take pain medicine.  You have chest or  stomach pain.  You have trouble breathing.  One side of your face or body suddenly becomes weak or numb.  Your nose bleeds.  There is blood in your urine. This information is not intended to replace advice given to you by your health care provider. Make sure you discuss any questions you have with your health care provider. Document Released: 04/22/2009 Document Revised: 12/02/2015 Document Reviewed: 08/20/2013 Elsevier Interactive Patient Education  [employer].

## 2018-02-12 NOTE — Progress Notes (Signed)
Pre visit review using our clinic review tool, if applicable. No additional management support is needed unless otherwise documented below in the visit note. 

## 2018-02-12 NOTE — Progress Notes (Signed)
Chief Complaint  Patient presents with  . Leg Pain    left leg swollen and red.   Acute  C/o left leg swelling and pain and red rash which starts out as spots but then spreads together which is painful no new meds sx's x 1 week nothing tried. Leg >right leg swelling and ankles >leg   Review of Systems  Constitutional: Positive for malaise/fatigue. Negative for fever and weight loss.  Respiratory: Negative for shortness of breath.   Cardiovascular: Negative for chest pain.  Musculoskeletal: Positive for joint pain.       +left leg pain and swelling   Skin: Positive for rash. Negative for itching.  Neurological: Negative for headaches.   Past Medical History:  Diagnosis Date  . Abdominal pain   . Anemia   . Anxiety    adjustment disorder with anxiety  . Arthritis   . BPPV (benign paroxysmal positional vertigo)   . Cataract   . Chronic diarrhea   . Complication of anesthesia   . Coronary artery disease   . Dysphasia   . Gastric ulcer 1980's  . GERD (gastroesophageal reflux disease)   . High cholesterol   . Hypertension   . Osteoporosis   . Schatzki's ring 05/02/2013  . Scoliosis    Past Surgical History:  Procedure Laterality Date  . ABDOMINAL HYSTERECTOMY    . CARDIAC CATHETERIZATION    . CHOLECYSTECTOMY    . COLONOSCOPY    . COLONOSCOPY N/A 11/12/2014   Procedure: COLONOSCOPY;  Surgeon: Manya Silvas, MD;  Location: [facility] ENDOSCOPY;  Service: Endoscopy;  Laterality: N/A;  . DIAGNOSTIC LAPAROSCOPY     paraesphogeal hernia  . ESOPHAGOGASTRODUODENOSCOPY    . ESOPHAGOGASTRODUODENOSCOPY (EGD) WITH PROPOFOL N/A 04/16/2017   Procedure: ESOPHAGOGASTRODUODENOSCOPY (EGD) WITH PROPOFOL;  Surgeon: Manya Silvas, MD;  Location: [facility] ENDOSCOPY;  Service: Endoscopy;  Laterality: N/A;  . HERNIA REPAIR     inguinal hernia   Family History  Problem Relation Age of Onset  . Diabetes Mother   . Parkinson's disease Mother   . Cancer Father        Pancreatic  . Cancer Sister         Lung  . Diabetes Sister   . Alzheimer's disease Paternal Aunt        x2 sisters  . Diabetes Sister   . Alzheimer's disease Sister   . Tics Sister   . Breast cancer Neg Hx    Social History   Socioeconomic History  . Marital status: Single    Spouse name: Not on file  . Number of children: Not on file  . Years of education: Not on file  . Highest education level: Not on file  Occupational History  . Not on file  Social Needs  . Financial resource strain: Not hard at all  . Food insecurity:    Worry: Never true    Inability: Never true  . Transportation needs:    Medical: No    Non-medical: No  Tobacco Use  . Smoking status: Never Smoker  . Smokeless tobacco: Never Used  Substance and Sexual Activity  . Alcohol use: No    Alcohol/week: 0.0 oz  . Drug use: No  . Sexual activity: Never  Lifestyle  . Physical activity:    Days per week: Not on file    Minutes per session: Not on file  . Stress: Not on file  Relationships  . Social connections:    Talks on phone: Not on file  Gets together: Not on file    Attends religious service: Not on file    Active member of club or organization: Not on file    Attends meetings of clubs or organizations: Not on file    Relationship status: Not on file  . Intimate partner violence:    Fear of current or ex partner: Not on file    Emotionally abused: Not on file    Physically abused: Not on file    Forced sexual activity: Not on file  Other Topics Concern  . Not on file  Social History Narrative   Divorced   Retired as a Scientist, research (medical)   12 + years of school    Current Meds  Medication Sig  . aspirin 81 MG tablet Take 81 mg by mouth daily.  . cholestyramine light 4 G POWD Take by mouth 1 day or 1 dose.  . clobetasol ointment (TEMOVATE) 8.58 % Apply 1 application topically 2 (two) times daily as needed. For rash. Avoid face.  . conjugated estrogens (PREMARIN) vaginal cream Place 1 Applicatorful vaginally 2 (two)  times a week. Dispense a 90 day supply.  . Cyanocobalamin (VITAMIN B 12 PO) Take by mouth.  . fenofibrate 160 MG tablet TAKE ONE TABLET BY MOUTH ONCE DAILY  . ferrous sulfate 325 (65 FE) MG tablet Take 325 mg by mouth 2 (two) times daily with a meal.  . ketoconazole (NIZORAL) 2 % cream Apply 1 application topically daily as needed.   . meclizine (ANTIVERT) 25 MG tablet Take 1 tablet (25 mg total) by mouth 2 (two) times daily as needed for dizziness.  . methocarbamol (ROBAXIN) 500 MG tablet   . metoprolol tartrate (LOPRESSOR) 100 MG tablet TAKE ONE TABLET BY MOUTH TWICE DAILY  . Misc Natural Products (OSTEO BI-FLEX/5-LOXIN ADVANCED) TABS Take 1 tablet by mouth 2 (two) times daily.  . Multiple Vitamins-Minerals (PRESERVISION AREDS 2 PO) Take by mouth.  . mupirocin ointment (BACTROBAN) 2 % Place 1 application into the nose 2 (two) times daily.  . naproxen sodium (ANAPROX) 220 MG tablet Take 220 mg by mouth daily. Sometimes pt takes twice a day if needed from pain  . Omega-3 Fatty Acids (OMEGA 3 500 PO) Take 1 capsule by mouth daily.   . pantoprazole (PROTONIX) 40 MG tablet Take 1 tablet (40 mg total) by mouth daily.  . potassium chloride (K-DUR) 10 MEQ tablet Take 10 mEq by mouth daily.  . Probiotic Product (PROBIOTIC DAILY) CAPS Take 1 capsule by mouth.  . simvastatin (ZOCOR) 40 MG tablet TAKE ONE TABLET BY MOUTH ONCE DAILY AT  6PM  . triamcinolone cream (KENALOG) 0.1 % Apply 1 application topically 2 (two) times daily as needed. For rash. Avoid face.   No Known Allergies Recent Results (from the past 2160 hour(s))  Hepatic function panel     Status: Abnormal   Collection Time: 11/19/17 10:32 AM  Result Value Ref Range   Total Bilirubin 0.7 0.2 - 1.2 mg/dL   Bilirubin, Direct 0.3 0.0 - 0.3 mg/dL   Alkaline Phosphatase 34 (L) 39 - 117 U/L   AST 29 0 - 37 U/L   ALT 20 0 - 35 U/L   Total Protein 6.3 6.0 - 8.3 g/dL   Albumin 3.5 3.5 - 5.2 g/dL  Iron and TIBC     Status: None   Collection  Time: 12/14/17 11:07 AM  Result Value Ref Range   Iron 99 28 - 170 ug/dL   TIBC 380 250 - 450  ug/dL   Saturation Ratios 26 10.4 - 31.8 %   UIBC 281 ug/dL    Comment: Performed at [facility], [address] [place], [place] [address]  Ferritin     Status: None   Collection Time: 12/14/17 11:07 AM  Result Value Ref Range   Ferritin 230 11 - 307 ng/mL    Comment: Performed at [facility], [address] [place], [place] [address]  CBC with Differential     Status: Abnormal   Collection Time: 12/14/17 11:07 AM  Result Value Ref Range   WBC 4.7 3.6 - 11.0 K/uL   RBC 3.90 3.80 - 5.20 MIL/uL   Hemoglobin 11.9 (L) 12.0 - 16.0 g/dL   HCT 35.0 35.0 - 47.0 %   MCV 89.8 80.0 - 100.0 fL   MCH 30.5 26.0 - 34.0 pg   MCHC 34.0 32.0 - 36.0 g/dL   RDW 13.8 11.5 - 14.5 %   Platelets 347 150 - 440 K/uL   Neutrophils Relative % 59 %   Neutro Abs 2.7 1.4 - 6.5 K/uL   Lymphocytes Relative 24 %   Lymphs Abs 1.1 1.0 - 3.6 K/uL   Monocytes Relative 10 %   Monocytes Absolute 0.5 0.2 - 0.9 K/uL   Eosinophils Relative 6 %   Eosinophils Absolute 0.3 0 - 0.7 K/uL   Basophils Relative 1 %   Basophils Absolute 0.0 0 - 0.1 K/uL    Comment: Performed at [facility], [address] [place], [place] [address]   Objective  Body mass index is 23.93 kg/m. Wt Readings from Last 3 Encounters:  02/12/18 124 lb 9.6 oz (56.5 kg)  10/02/17 132 lb 12.8 oz (60.2 kg)  09/20/17 135 lb 9.6 oz (61.5 kg)   Temp Readings from Last 3 Encounters:  02/12/18 97.7 F (36.5 C) (Oral)  10/02/17 98.2 F (36.8 C) (Oral)  09/20/17 (!) 97.4 F (36.3 C) (Oral)   BP Readings from Last 3 Encounters:  02/12/18 122/82  10/02/17 106/64  09/20/17 106/66   Pulse Readings from Last 3 Encounters:  02/12/18 80  10/02/17 62  09/20/17 60    Physical Exam  Constitutional: She is oriented to person, place, and time. Vital signs are normal. She appears well-developed and well-nourished. She is  cooperative.  HENT:  Head: Normocephalic and atraumatic.  Mouth/Throat: Oropharynx is clear and moist and mucous membranes are normal.  Eyes: Pupils are equal, round, and reactive to light. Conjunctivae are normal.  Cardiovascular: Normal rate, regular rhythm and normal heart sounds.  Left leg swelling   Pulmonary/Chest: Effort normal and breath sounds normal.  Neurological: She is alert and oriented to person, place, and time. Gait normal.  Skin: Skin is warm and dry. Rash noted.  LLE c/w vasculitis  Due to leg edema and pain r/o DVT  Psychiatric: She has a normal mood and affect. Her speech is normal and behavior is normal. Judgment and thought content normal. Cognition and memory are normal.  Nursing note and vitals reviewed.   Assessment   1. Leg leg swelling and pain and rash r/o DVT, vs vasculitis  2. Arthralgia  Plan   1. [place] lle r/o DVT  Labs RA labs, UA, C4, hold CXR, hcv neg 09/20/15  Refer to dermatology for bx and vascular w/u and r/o PAD vs other cause leg edema  Prednisone taper x 2 weeks   2. RA w/u   Provider: Dr. Olivia Mackie McLean-Scocuz[facility]

## 2018-02-13 ENCOUNTER — Ambulatory Visit: Payer: PPO

## 2018-02-13 LAB — URINALYSIS, ROUTINE W REFLEX MICROSCOPIC
Bilirubin, UA: NEGATIVE
GLUCOSE, UA: NEGATIVE
Ketones, UA: NEGATIVE
Leukocytes, UA: NEGATIVE
NITRITE UA: NEGATIVE
Protein, UA: NEGATIVE
RBC, UA: NEGATIVE
Specific Gravity, UA: 1.015 (ref 1.005–1.030)
UUROB: 0.2 mg/dL (ref 0.2–1.0)
pH, UA: 8.5 — ABNORMAL HIGH (ref 5.0–7.5)

## 2018-02-13 LAB — RHEUMATOID FACTOR: Rhuematoid fact SerPl-aCnc: <14 IU/mL (ref <14)

## 2018-02-13 LAB — CYCLIC CITRUL PEPTIDE ANTIBODY, IGG

## 2018-02-14 LAB — ANA: Anti Nuclear Antibody(ANA): NEGATIVE

## 2018-02-14 LAB — C3 AND C4
C3 Complement: 109 mg/dL (ref 83–193)
C4 COMPLEMENT: 28 mg/dL (ref 15–57)

## 2018-02-14 LAB — ANCA SCREEN W REFLEX TITER
ANCA Screen: POSITIVE — AB
C-ANCA Titer: 1:40 {titer} — ABNORMAL HIGH

## 2018-02-15 ENCOUNTER — Telehealth: Payer: Self-pay

## 2018-02-15 NOTE — Telephone Encounter (Signed)
Copied from [place] [id number]. Topic: Referral - Status >> Feb 14, 2018  3:42 PM Synthia Innocent wrote: Reason for CRM: Patient is checking status of dermatology referral

## 2018-02-18 ENCOUNTER — Ambulatory Visit (INDEPENDENT_AMBULATORY_CARE_PROVIDER_SITE_OTHER): Payer: PPO | Admitting: Vascular Surgery

## 2018-02-18 ENCOUNTER — Telehealth: Payer: Self-pay | Admitting: Family Medicine

## 2018-02-18 ENCOUNTER — Encounter (INDEPENDENT_AMBULATORY_CARE_PROVIDER_SITE_OTHER): Payer: Self-pay | Admitting: Vascular Surgery

## 2018-02-18 VITALS — BP 121/76 | HR 61 | Resp 16 | Ht 60.0 in | Wt 121.0 lb

## 2018-02-18 DIAGNOSIS — I83813 Varicose veins of bilateral lower extremities with pain: Secondary | ICD-10-CM | POA: Diagnosis not present

## 2018-02-18 DIAGNOSIS — M79604 Pain in right leg: Secondary | ICD-10-CM

## 2018-02-18 DIAGNOSIS — M79605 Pain in left leg: Secondary | ICD-10-CM

## 2018-02-18 DIAGNOSIS — R600 Localized edema: Secondary | ICD-10-CM | POA: Diagnosis not present

## 2018-02-18 NOTE — Telephone Encounter (Unsigned)
Copied from [place] [phone]. Topic: Quick Communication - See Telephone Encounter >> Feb 18, 2018  5:15 PM Neva Seat wrote: Pt hasn't heard yet from the rheumatologist referral.  Please call pt back to let her know.

## 2018-02-18 NOTE — Progress Notes (Signed)
Subjective:    Patient ID: Lindsay Tran, female    DOB: Nov 01, 1945, 72 y.o.   MRN: [id number] Chief Complaint  Patient presents with  . New Patient (Initial Visit)    left leg pain   Presents as a new patient referred by Dr. Jacklynn Lewis for evaluation of bilateral lower extremity edema and painful varicose veins.  The patient reports a long-standing history of experiencing lower extremity edema however has noticed this is progressively worsening over the last few years.  The patient also notes experiencing a heaviness, tingling and burning to her bilateral legs.  The patient has diffuse varicosities to the bilateral lower extremity which also cause her discomfort.  The patient's discomfort and IMA worsen towards the end of the day or with sitting or standing for long periods of time.  The patient notes that her discomfort has progressed to the point that she is unable to function on a daily basis.  Recent DVT study of left lower extremity completed at [facility] ED was negative.  At this time, the patient does not engage in conservative therapy including wearing medical grade 1 compression socks, elevating her legs or remaining active on a daily basis.  The patient denies any recent surgery or trauma, DVT history, recurrent cellulitis, rest pain or ulcer formation to the bilateral lower extremity.  The patient does experience calf cramping usually at night.  Patient denies any fever, nausea vomiting.  Review of Systems  Constitutional: Negative.   HENT: Negative.   Eyes: Negative.   Respiratory: Negative.   Cardiovascular: Positive for leg swelling.       Painful varicose veins  Gastrointestinal: Negative.   Endocrine: Negative.   Genitourinary: Negative.   Musculoskeletal: Negative.   Skin: Negative.   Allergic/Immunologic: Negative.   Neurological: Negative.   Hematological: Negative.   Psychiatric/Behavioral: Negative.       Objective:   Physical Exam    Constitutional: She is oriented to person, place, and time. She appears well-developed and well-nourished. No distress.  HENT:  Head: Normocephalic and atraumatic.  Right Ear: External ear normal.  Left Ear: External ear normal.  Eyes: Pupils are equal, round, and reactive to light. Conjunctivae and EOM are normal.  Neck: Normal range of motion.  Cardiovascular: Normal rate, regular rhythm, normal heart sounds and intact distal pulses.  Pulses:      Radial pulses are 2+ on the right side, and 2+ on the left side.  Hard to palpate pedal pulses however the bilateral feet are warm  Pulmonary/Chest: Effort normal and breath sounds normal.  Musculoskeletal: Normal range of motion. She exhibits edema (Mild nonpitting bilateral lower extremity edema).  Neurological: She is alert and oriented to person, place, and time.  Skin: She is not diaphoretic.  Greater than 1 cm less than 1 cm diffuse varicosities to the bilateral lower extremity.  There is no active cellulitis, ulceration, stasis dermatitis or fibrosis noted to the bilateral legs  Psychiatric: She has a normal mood and affect. Her behavior is normal. Judgment and thought content normal.  Vitals reviewed.  BP 121/76   Pulse 61   Resp 16   Ht 5' (1.524 m)   Wt 121 lb (54.9 kg)   BMI 23.63 kg/m   Past Medical History:  Diagnosis Date  . Abdominal pain   . Anemia   . Anxiety    adjustment disorder with anxiety  . Arthritis   . BPPV (benign paroxysmal positional vertigo)   . Cataract   .  Chronic diarrhea   . Complication of anesthesia   . Coronary artery disease   . Dysphasia   . Gastric ulcer 1980's  . GERD (gastroesophageal reflux disease)   . High cholesterol   . Hypertension   . Osteoporosis   . Schatzki's ring 05/02/2013  . Scoliosis    Social History   Socioeconomic History  . Marital status: Single    Spouse name: Not on file  . Number of children: Not on file  . Years of education: Not on file  . Highest  education level: Not on file  Occupational History  . Not on file  Social Needs  . Financial resource strain: Not hard at all  . Food insecurity:    Worry: Never true    Inability: Never true  . Transportation needs:    Medical: No    Non-medical: No  Tobacco Use  . Smoking status: Never Smoker  . Smokeless tobacco: Never Used  Substance and Sexual Activity  . Alcohol use: No    Alcohol/week: 0.0 standard drinks  . Drug use: No  . Sexual activity: Never  Lifestyle  . Physical activity:    Days per week: Not on file    Minutes per session: Not on file  . Stress: Not on file  Relationships  . Social connections:    Talks on phone: Not on file    Gets together: Not on file    Attends religious service: Not on file    Active member of club or organization: Not on file    Attends meetings of clubs or organizations: Not on file    Relationship status: Not on file  . Intimate partner violence:    Fear of current or ex partner: Not on file    Emotionally abused: Not on file    Physically abused: Not on file    Forced sexual activity: Not on file  Other Topics Concern  . Not on file  Social History Narrative   Divorced   Retired as a Scientist, research (medical)   12 + years of school    Past Surgical History:  Procedure Laterality Date  . ABDOMINAL HYSTERECTOMY    . CARDIAC CATHETERIZATION    . CHOLECYSTECTOMY    . COLONOSCOPY    . COLONOSCOPY N/A 11/12/2014   Procedure: COLONOSCOPY;  Surgeon: Manya Silvas, MD;  Location: [facility] ENDOSCOPY;  Service: Endoscopy;  Laterality: N/A;  . DIAGNOSTIC LAPAROSCOPY     paraesphogeal hernia  . ESOPHAGOGASTRODUODENOSCOPY    . ESOPHAGOGASTRODUODENOSCOPY (EGD) WITH PROPOFOL N/A 04/16/2017   Procedure: ESOPHAGOGASTRODUODENOSCOPY (EGD) WITH PROPOFOL;  Surgeon: Manya Silvas, MD;  Location: [facility] ENDOSCOPY;  Service: Endoscopy;  Laterality: N/A;  . HERNIA REPAIR     inguinal hernia   Family History  Problem Relation Age of Onset  . Diabetes  Mother   . Parkinson's disease Mother   . Cancer Father        Pancreatic  . Cancer Sister        Lung  . Diabetes Sister   . Alzheimer's disease Paternal Aunt        x2 sisters  . Diabetes Sister   . Alzheimer's disease Sister   . Tics Sister   . Breast cancer Neg Hx    No Known Allergies     Assessment & Plan:  Presents as a new patient referred by Dr. Jacklynn Lewis for evaluation of bilateral lower extremity edema and painful varicose veins.  The patient reports a long-standing history of  experiencing lower extremity edema however has noticed this is progressively worsening over the last few years.  The patient also notes experiencing a heaviness, tingling and burning to her bilateral legs.  The patient has diffuse varicosities to the bilateral lower extremity which also cause her discomfort.  The patient's discomfort and IMA worsen towards the end of the day or with sitting or standing for long periods of time.  The patient notes that her discomfort has progressed to the point that she is unable to function on a daily basis.  Recent DVT study of left lower extremity completed at [facility] ED was negative.  At this time, the patient does not engage in conservative therapy including wearing medical grade 1 compression socks, elevating her legs or remaining active on a daily basis.  The patient denies any recent surgery or trauma, DVT history, recurrent cellulitis, rest pain or ulcer formation to the bilateral lower extremity.  The patient does experience calf cramping usually at night.  Patient denies any fever, nausea vomiting.  1. Varicose veins of both lower extremities with pain - New The patient was encouraged to wear graduated compression stockings (20-30 mmHg) on a daily basis. The patient was instructed to begin wearing the stockings first thing in the morning and removing them in the evening. The patient was instructed specifically not to sleep in the stockings.  Prescription given.  In addition, behavioral modification including elevation during the day will be initiated. Anti-inflammatories for pain. I will bring the patient back and have her undergo bilateral lower extremity venous duplex to rule out venous disease versus lymphatic The patient will follow up in one month to asses conservative management.  Information on chronic venous insufficiency and compression stockings was given to the patient. The patient was instructed to call the office in the interim if any worsening edema or ulcerations to the legs, feet or toes occurs. The patient expresses their understanding.  - VAS [place] LOWER EXTREMITY VENOUS REFLUX; Future  2. Bilateral lower extremity edema - New As Above  - VAS [place] LOWER EXTREMITY VENOUS REFLUX; Future  3. Pain in both lower extremities - New Patient with multiple risk factors for peripheral artery disease Unable to palpate pedal pulses on exam The patient does experience bilateral calf cramping especially at night Bring the patient back and have her undergo an ABI to assess her arterial patency I have discussed with the patient at length the risk factors for and pathogenesis of atherosclerotic disease and encouraged a healthy diet, regular exercise regimen and blood pressure / glucose control.  The patient was encouraged to call the office in the interim if he experiences any claudication like symptoms, rest pain or ulcers to his feet / toes.  - VAS [place] ABI WITH/WO TBI; Future  Current Outpatient Medications on File Prior to Visit  Medication Sig Dispense Refill  . aspirin 81 MG tablet Take 81 mg by mouth daily.    . cholestyramine light 4 G POWD Take by mouth 1 day or 1 dose.    . clobetasol ointment (TEMOVATE) 1.93 % Apply 1 application topically 2 (two) times daily as needed. For rash. Avoid face. 180 g 1  . conjugated estrogens (PREMARIN) vaginal cream Place 1 Applicatorful vaginally 2 (two) times a week. Dispense a 90 day  supply. 60 g 2  . Cyanocobalamin (VITAMIN B 12 PO) Take by mouth.    . fenofibrate 160 MG tablet TAKE ONE TABLET BY MOUTH ONCE DAILY 90 tablet 3  .  ketoconazole (NIZORAL) 2 % cream Apply 1 application topically daily as needed.     . meclizine (ANTIVERT) 25 MG tablet Take 1 tablet (25 mg total) by mouth 2 (two) times daily as needed for dizziness. 20 tablet 0  . methocarbamol (ROBAXIN) 500 MG tablet   1  . metoprolol tartrate (LOPRESSOR) 100 MG tablet TAKE ONE TABLET BY MOUTH TWICE DAILY 180 tablet 3  . Misc Natural Products (OSTEO BI-FLEX/5-LOXIN ADVANCED) TABS Take 1 tablet by mouth 2 (two) times daily.    . Multiple Vitamins-Minerals (PRESERVISION AREDS 2 PO) Take by mouth.    . mupirocin ointment (BACTROBAN) 2 % Place 1 application into the nose 2 (two) times daily. 22 g 1  . naproxen sodium (ANAPROX) 220 MG tablet Take 220 mg by mouth daily. Sometimes pt takes twice a day if needed from pain    . Omega-3 Fatty Acids (OMEGA 3 500 PO) Take 1 capsule by mouth daily.     . pantoprazole (PROTONIX) 40 MG tablet Take 1 tablet (40 mg total) by mouth daily. 90 tablet 1  . potassium chloride (K-DUR) 10 MEQ tablet Take 10 mEq by mouth daily.    . predniSONE (DELTASONE) 20 MG tablet 40 mg (2 pills) x 5 days with food, 20 mg (1 pill) x 5 days and 10 mg (1/2 pill x 5 days) 18 tablet 0  . Probiotic Product (PROBIOTIC DAILY) CAPS Take 1 capsule by mouth.    . simvastatin (ZOCOR) 40 MG tablet TAKE ONE TABLET BY MOUTH ONCE DAILY AT  6PM 90 tablet 3  . triamcinolone cream (KENALOG) 0.1 % Apply 1 application topically 2 (two) times daily as needed. For rash. Avoid face. 80 g 2  . ferrous sulfate 325 (65 FE) MG tablet Take 325 mg by mouth 2 (two) times daily with a meal.     No current facility-administered medications on file prior to visit.    There are no Patient Instructions on file for this visit. No follow-ups on file.  Stevan Eberwein A Hoang Pettingill, PA-C

## 2018-02-19 ENCOUNTER — Telehealth: Payer: Self-pay

## 2018-02-19 ENCOUNTER — Other Ambulatory Visit: Payer: Self-pay | Admitting: Internal Medicine

## 2018-02-19 DIAGNOSIS — M79605 Pain in left leg: Principal | ICD-10-CM

## 2018-02-19 DIAGNOSIS — I8312 Varicose veins of left lower extremity with inflammation: Secondary | ICD-10-CM | POA: Diagnosis not present

## 2018-02-19 DIAGNOSIS — D692 Other nonthrombocytopenic purpura: Secondary | ICD-10-CM | POA: Diagnosis not present

## 2018-02-19 DIAGNOSIS — R21 Rash and other nonspecific skin eruption: Secondary | ICD-10-CM | POA: Diagnosis not present

## 2018-02-19 DIAGNOSIS — M79604 Pain in right leg: Secondary | ICD-10-CM

## 2018-02-19 NOTE — Telephone Encounter (Signed)
Patient saw Dr.Mclean

## 2018-02-19 NOTE — Telephone Encounter (Signed)
Please place rheumatology referral.  Did not see referral in chart.

## 2018-02-19 NOTE — Telephone Encounter (Signed)
Copied from [place] [id number]. Topic: Referral - Status >> Feb 18, 2018  5:18 PM Neva Seat wrote:  Pt hasn't heard yet from the rheumatologist referral.  Please call pt back to let her know the stauts

## 2018-02-19 NOTE — Telephone Encounter (Signed)
Order placed for rheumatology referral.  

## 2018-02-19 NOTE — Progress Notes (Signed)
Order placed for rheumatology referral.  

## 2018-02-20 DIAGNOSIS — M19072 Primary osteoarthritis, left ankle and foot: Secondary | ICD-10-CM | POA: Diagnosis not present

## 2018-03-22 DIAGNOSIS — I8393 Asymptomatic varicose veins of bilateral lower extremities: Secondary | ICD-10-CM | POA: Diagnosis not present

## 2018-03-22 DIAGNOSIS — D692 Other nonthrombocytopenic purpura: Secondary | ICD-10-CM | POA: Diagnosis not present

## 2018-03-22 DIAGNOSIS — I8312 Varicose veins of left lower extremity with inflammation: Secondary | ICD-10-CM | POA: Diagnosis not present

## 2018-03-22 DIAGNOSIS — L905 Scar conditions and fibrosis of skin: Secondary | ICD-10-CM | POA: Diagnosis not present

## 2018-03-27 ENCOUNTER — Ambulatory Visit (INDEPENDENT_AMBULATORY_CARE_PROVIDER_SITE_OTHER): Payer: PPO | Admitting: Family Medicine

## 2018-03-27 ENCOUNTER — Encounter: Payer: Self-pay | Admitting: Family Medicine

## 2018-03-27 VITALS — BP 102/64 | HR 59 | Temp 97.7°F | Ht 60.0 in | Wt 119.6 lb

## 2018-03-27 DIAGNOSIS — K140 Glossitis: Secondary | ICD-10-CM | POA: Diagnosis not present

## 2018-03-27 DIAGNOSIS — K219 Gastro-esophageal reflux disease without esophagitis: Secondary | ICD-10-CM | POA: Diagnosis not present

## 2018-03-27 DIAGNOSIS — R300 Dysuria: Secondary | ICD-10-CM

## 2018-03-27 DIAGNOSIS — R768 Other specified abnormal immunological findings in serum: Secondary | ICD-10-CM

## 2018-03-27 DIAGNOSIS — H8113 Benign paroxysmal vertigo, bilateral: Secondary | ICD-10-CM | POA: Diagnosis not present

## 2018-03-27 DIAGNOSIS — Z1239 Encounter for other screening for malignant neoplasm of breast: Secondary | ICD-10-CM

## 2018-03-27 DIAGNOSIS — I10 Essential (primary) hypertension: Secondary | ICD-10-CM

## 2018-03-27 DIAGNOSIS — Z1231 Encounter for screening mammogram for malignant neoplasm of breast: Secondary | ICD-10-CM

## 2018-03-27 DIAGNOSIS — R42 Dizziness and giddiness: Secondary | ICD-10-CM | POA: Diagnosis not present

## 2018-03-27 DIAGNOSIS — Z23 Encounter for immunization: Secondary | ICD-10-CM

## 2018-03-27 LAB — POCT URINALYSIS DIPSTICK
BILIRUBIN UA: NEGATIVE
Blood, UA: NEGATIVE
GLUCOSE UA: NEGATIVE
KETONES UA: NEGATIVE
Leukocytes, UA: NEGATIVE
Nitrite, UA: NEGATIVE
PH UA: 7.5 (ref 5.0–8.0)
Protein, UA: NEGATIVE
Spec Grav, UA: 1.015 (ref 1.010–1.025)
UROBILINOGEN UA: 0.2 U/dL

## 2018-03-27 NOTE — Progress Notes (Signed)
Tommi Rumps, MD Phone: [phone]  Lindsay Tran is a 72 y.o. female who presents today for follow-up.  CC: GERD, vertigo, hypertension, dysuria, tongue ulcer  GERD: Taking Protonix.  No reflux, abdominal pain, blood in her stool, or dysphagia.  Vertigo: Patient notes a couple weeks ago she had vertigo with the room spinning.  This would occur if she leaned her head back.  Meclizine helps with this.  Started about the same time she started on prednisone previously.  She has had vertigo intermittently for the past year.  Previously felt to be BPPV related.  Dysuria: This has been going on 1 week.  Some urgency.  No frequency.  No blood in her urine.  No fevers.  No abdominal pain.  Hypertension: Well-controlled at home.  Taking metoprolol.  No chest pain or shortness of breath.  She has chronic mild lower extremity edema.  She reports she was previously referred to rheumatology in [place] though does not want to go that far.  It looks like she had positive ANCA test.  She possibly has had vasculitis.  She did see dermatology though no biopsy was done given that she was on prednisone.  Tongue ulcer: Noted on exam.  She notes her dentist has been watching this as well.  They are unsure of the cause.  She has had them intermittently over the last year.  Social History   Tobacco Use  Smoking Status Never Smoker  Smokeless Tobacco Never Used     ROS see history of present illness  Objective  Physical Exam Vitals:   03/27/18 1053  BP: 102/64  Pulse: (!) 59  Temp: 97.7 F (36.5 C)  SpO2: 97%    BP Readings from Last 3 Encounters:  03/27/18 102/64  02/18/18 121/76  02/12/18 122/82   Wt Readings from Last 3 Encounters:  03/27/18 119 lb 9.6 oz (54.3 kg)  02/18/18 121 lb (54.9 kg)  02/12/18 124 lb 9.6 oz (56.5 kg)    Physical Exam  Constitutional: No distress.  HENT:  Small ulceration on the left mid aspect of her tongue  Cardiovascular: Normal rate, regular  rhythm and normal heart sounds.  Pulmonary/Chest: Effort normal and breath sounds normal.  Abdominal: Soft. Bowel sounds are normal. She exhibits no distension. There is no tenderness. There is no rebound and no guarding.  Musculoskeletal: She exhibits no edema.  Neurological: She is alert.  Skin: Skin is warm and dry. She is not diaphoretic.     Assessment/Plan: Please see individual problem list.  BPPV (benign paroxysmal positional vertigo) Symptoms could be BPPV related.  They have resolved at this point.  Given recurrence we will refer to ENT.  Acid reflux Currently well controlled.  Continue current regimen.  HTN (hypertension) Adequately controlled.  Continue current regimen.  Dysuria Check urinalysis.  Tongue ulcer Refer to ENT for evaluation.  Antineutrophil cytoplasmic antibody (ANCA) positive We will refer to rheumatology locally.   Health Maintenance: mammogram ordered. Patient to call and schedule.  Orders Placed This Encounter  Procedures  . MM 3D SCREEN BREAST BILATERAL    Standing Status:   Future    Standing Expiration Date:   05/28/2019    Order Specific Question:   Reason for Exam (SYMPTOM  OR DIAGNOSIS REQUIRED)    Answer:   breast cancer screening    Order Specific Question:   Preferred imaging location?    Answer:   [facility]  . Flu vaccine HIGH DOSE PF (Fluzone High dose)  . Flu  vaccine HIGH DOSE PF  . Ambulatory referral to ENT    Referral Priority:   Routine    Referral Type:   Consultation    Referral Reason:   Specialty Services Required    Requested Specialty:   Otolaryngology    Number of Visits Requested:   1  . Ambulatory referral to Rheumatology    Referral Priority:   Routine    Referral Type:   Consultation    Referral Reason:   Specialty Services Required    Requested Specialty:   Rheumatology    Number of Visits Requested:   1  . POCT Urinalysis Dipstick    No orders of the defined types were placed in this  encounter.    Tommi Rumps, MD [place]

## 2018-03-27 NOTE — Patient Instructions (Signed)
Nice to see you. We will check your urine today and contact you with the results. We will get you to see rheumatology and ENT.

## 2018-03-28 DIAGNOSIS — K140 Glossitis: Secondary | ICD-10-CM | POA: Insufficient documentation

## 2018-03-28 DIAGNOSIS — R768 Other specified abnormal immunological findings in serum: Secondary | ICD-10-CM | POA: Insufficient documentation

## 2018-03-28 DIAGNOSIS — R7689 Other specified abnormal immunological findings in serum: Secondary | ICD-10-CM | POA: Insufficient documentation

## 2018-03-28 NOTE — Assessment & Plan Note (Signed)
Refer to ENT for evaluation 

## 2018-03-28 NOTE — Assessment & Plan Note (Signed)
Currently well controlled.  Continue current regimen.

## 2018-03-28 NOTE — Assessment & Plan Note (Signed)
Symptoms could be BPPV related.  They have resolved at this point.  Given recurrence we will refer to ENT.

## 2018-03-28 NOTE — Assessment & Plan Note (Signed)
We will refer to rheumatology locally.

## 2018-03-28 NOTE — Assessment & Plan Note (Signed)
Check urinalysis. 

## 2018-03-28 NOTE — Assessment & Plan Note (Signed)
Adequately controlled.  Continue current regimen. 

## 2018-03-29 DIAGNOSIS — Z23 Encounter for immunization: Secondary | ICD-10-CM | POA: Diagnosis not present

## 2018-03-29 DIAGNOSIS — R300 Dysuria: Secondary | ICD-10-CM | POA: Diagnosis not present

## 2018-03-29 DIAGNOSIS — R768 Other specified abnormal immunological findings in serum: Secondary | ICD-10-CM | POA: Diagnosis not present

## 2018-03-29 DIAGNOSIS — I10 Essential (primary) hypertension: Secondary | ICD-10-CM | POA: Diagnosis not present

## 2018-03-29 DIAGNOSIS — K140 Glossitis: Secondary | ICD-10-CM | POA: Diagnosis not present

## 2018-03-29 DIAGNOSIS — K219 Gastro-esophageal reflux disease without esophagitis: Secondary | ICD-10-CM | POA: Diagnosis not present

## 2018-03-29 DIAGNOSIS — H8113 Benign paroxysmal vertigo, bilateral: Secondary | ICD-10-CM | POA: Diagnosis not present

## 2018-03-29 DIAGNOSIS — R42 Dizziness and giddiness: Secondary | ICD-10-CM | POA: Diagnosis not present

## 2018-03-29 DIAGNOSIS — Z1231 Encounter for screening mammogram for malignant neoplasm of breast: Secondary | ICD-10-CM | POA: Diagnosis not present

## 2018-04-01 ENCOUNTER — Other Ambulatory Visit: Payer: Self-pay | Admitting: *Deleted

## 2018-04-01 ENCOUNTER — Other Ambulatory Visit: Payer: Self-pay | Admitting: Family Medicine

## 2018-04-01 NOTE — Telephone Encounter (Signed)
Refill of antivert LRF 12/24/15  #20 0 refills  Refill of Bactroban 2 % LRF 01/02/17  22G  1 refill  LOV 03/27/18 Dr. Caryl Bis Intermittent vertigo noted at that appt.   [facility] [address], [place]            [phone] (Phone) [phone] (Fax)

## 2018-04-01 NOTE — Telephone Encounter (Signed)
Copied from [place] [id number]. Topic: Quick Communication - Rx Refill/Question >> Apr 01, 2018  3:54 PM Hassell Done, [place], NT wrote: Medication: pantoprazole (PROTONIX) 40 MG tablet,mupirocin ointment (BACTROBAN) 2 % meclizine (ANTIVERT) 25 MG tablet  Has the patient contacted their pharmacy? No. (Agent: If no, request that the patient contact the pharmacy for the refill.) (Agent: If yes, when and what did the pharmacy advise?)  Preferred Pharmacy (with phone number or street name): [facility] [address] [place] [phone] (Phone) [phone] (Fax)    Agent: Please be advised that RX refills may take up to 3 business days. We ask that you follow-up with your pharmacy.

## 2018-04-02 NOTE — Telephone Encounter (Signed)
rx request 

## 2018-04-03 DIAGNOSIS — H903 Sensorineural hearing loss, bilateral: Secondary | ICD-10-CM | POA: Diagnosis not present

## 2018-04-03 DIAGNOSIS — R42 Dizziness and giddiness: Secondary | ICD-10-CM | POA: Diagnosis not present

## 2018-04-03 DIAGNOSIS — H6123 Impacted cerumen, bilateral: Secondary | ICD-10-CM | POA: Diagnosis not present

## 2018-04-03 DIAGNOSIS — D649 Anemia, unspecified: Secondary | ICD-10-CM | POA: Diagnosis not present

## 2018-04-03 DIAGNOSIS — I10 Essential (primary) hypertension: Secondary | ICD-10-CM | POA: Diagnosis not present

## 2018-04-03 DIAGNOSIS — R2689 Other abnormalities of gait and mobility: Secondary | ICD-10-CM | POA: Diagnosis not present

## 2018-04-04 DIAGNOSIS — M810 Age-related osteoporosis without current pathological fracture: Secondary | ICD-10-CM | POA: Insufficient documentation

## 2018-04-04 DIAGNOSIS — M4185 Other forms of scoliosis, thoracolumbar region: Secondary | ICD-10-CM | POA: Diagnosis not present

## 2018-04-04 DIAGNOSIS — I8393 Asymptomatic varicose veins of bilateral lower extremities: Secondary | ICD-10-CM | POA: Insufficient documentation

## 2018-04-04 DIAGNOSIS — R760 Raised antibody titer: Secondary | ICD-10-CM | POA: Diagnosis not present

## 2018-04-04 DIAGNOSIS — R768 Other specified abnormal immunological findings in serum: Secondary | ICD-10-CM | POA: Diagnosis not present

## 2018-04-10 DIAGNOSIS — M810 Age-related osteoporosis without current pathological fracture: Secondary | ICD-10-CM | POA: Diagnosis not present

## 2018-04-18 DIAGNOSIS — I8393 Asymptomatic varicose veins of bilateral lower extremities: Secondary | ICD-10-CM | POA: Diagnosis not present

## 2018-04-18 DIAGNOSIS — M810 Age-related osteoporosis without current pathological fracture: Secondary | ICD-10-CM | POA: Diagnosis not present

## 2018-04-23 ENCOUNTER — Ambulatory Visit
Admission: RE | Admit: 2018-04-23 | Discharge: 2018-04-23 | Disposition: A | Discharge status: Home / Self Care | Payer: PPO | Source: Ambulatory Visit | Attending: Family Medicine | Admitting: Family Medicine

## 2018-04-23 DIAGNOSIS — Z1231 Encounter for screening mammogram for malignant neoplasm of breast: Secondary | ICD-10-CM | POA: Diagnosis not present

## 2018-04-23 DIAGNOSIS — Z1239 Encounter for other screening for malignant neoplasm of breast: Secondary | ICD-10-CM

## 2018-04-24 ENCOUNTER — Ambulatory Visit (INDEPENDENT_AMBULATORY_CARE_PROVIDER_SITE_OTHER): Payer: PPO

## 2018-04-24 ENCOUNTER — Ambulatory Visit (INDEPENDENT_AMBULATORY_CARE_PROVIDER_SITE_OTHER): Payer: PPO | Admitting: Nurse Practitioner

## 2018-04-24 ENCOUNTER — Encounter (INDEPENDENT_AMBULATORY_CARE_PROVIDER_SITE_OTHER): Payer: Self-pay | Admitting: Nurse Practitioner

## 2018-04-24 VITALS — BP 126/73 | HR 80 | Resp 17 | Ht 64.0 in | Wt 120.0 lb

## 2018-04-24 DIAGNOSIS — I10 Essential (primary) hypertension: Secondary | ICD-10-CM

## 2018-04-24 DIAGNOSIS — R600 Localized edema: Secondary | ICD-10-CM

## 2018-04-24 DIAGNOSIS — M79604 Pain in right leg: Secondary | ICD-10-CM

## 2018-04-24 DIAGNOSIS — M79605 Pain in left leg: Secondary | ICD-10-CM | POA: Diagnosis not present

## 2018-04-24 DIAGNOSIS — E785 Hyperlipidemia, unspecified: Secondary | ICD-10-CM

## 2018-04-24 DIAGNOSIS — I83813 Varicose veins of bilateral lower extremities with pain: Secondary | ICD-10-CM

## 2018-04-25 ENCOUNTER — Encounter (INDEPENDENT_AMBULATORY_CARE_PROVIDER_SITE_OTHER): Payer: Self-pay | Admitting: Nurse Practitioner

## 2018-04-25 NOTE — Progress Notes (Signed)
Subjective:    Patient ID: Lindsay Tran, female    DOB: 1946/01/31, 72 y.o.   MRN: [id number] Chief Complaint  Patient presents with  . Follow-up    1 month ABI and venous reflux    HPI  Lindsay Tran is a 72 y.o. female that is seen for following up for symptomatic varicose veins. The patient relates burning and stinging which worsened steadily throughout the course of the day, particularly with standing. The patient also notes an aching and throbbing pain over the varicosities, particularly with prolonged dependent positions.  The left is worse than the right.  The symptoms are significantly improved with elevation.  The patient also notes that during hot weather the symptoms are greatly intensified. The patient states the pain from the varicose veins interferes with work, daily exercise, shopping and household maintenance. At this point, the symptoms are persistent and severe enough that they're having a negative impact on lifestyle and are interfering with daily activities.  There is no history of DVT, PE or superficial thrombophlebitis. There is no history of ulceration or hemorrhage. The patient denies a significant family history of varicose veins.  The patient has  worn graduated compression since 02/18/2018.  She has been utilizing conservative therapy, such as elevation, NSAIDs and exercise.  The patient underwent bilateral ABIs to rule out peripheral arterial disease as part of a cause for her pain.  The right ABI was 1.41, the left 1.32.  The bilateral lower extremities had triphasic waveforms.   The patient also underwent bilateral venous reflux study, the right lower extremity has reflux in the common femoral vein and saphenofemoral junction.  At the left lower extremity has reflux in the great saphenous vein and saphenofemoral junction.  There are no DVTs or superficial venous thrombosis in either lower extremity.  Past Medical History:  Diagnosis Date  . Abdominal pain     . Anemia   . Anxiety    adjustment disorder with anxiety  . Arthritis   . BPPV (benign paroxysmal positional vertigo)   . Cataract   . Chronic diarrhea   . Complication of anesthesia   . Coronary artery disease   . Dysphasia   . Gastric ulcer 1980's  . GERD (gastroesophageal reflux disease)   . High cholesterol   . Hypertension   . Osteoporosis   . Schatzki's ring 05/02/2013  . Scoliosis     Social History   Socioeconomic History  . Marital status: Single    Spouse name: Not on file  . Number of children: Not on file  . Years of education: Not on file  . Highest education level: Not on file  Occupational History  . Not on file  Social Needs  . Financial resource strain: Not hard at all  . Food insecurity:    Worry: Never true    Inability: Never true  . Transportation needs:    Medical: No    Non-medical: No  Tobacco Use  . Smoking status: Never Smoker  . Smokeless tobacco: Never Used  Substance and Sexual Activity  . Alcohol use: No    Alcohol/week: 0.0 standard drinks  . Drug use: No  . Sexual activity: Never  Lifestyle  . Physical activity:    Days per week: Not on file    Minutes per session: Not on file  . Stress: Not on file  Relationships  . Social connections:    Talks on phone: Not on file    Gets together: Not  on file    Attends religious service: Not on file    Active member of club or organization: Not on file    Attends meetings of clubs or organizations: Not on file    Relationship status: Not on file  . Intimate partner violence:    Fear of current or ex partner: Not on file    Emotionally abused: Not on file    Physically abused: Not on file    Forced sexual activity: Not on file  Other Topics Concern  . Not on file  Social History Narrative   Divorced   Retired as a Scientist, research (medical)   12 + years of school     Past Surgical History:  Procedure Laterality Date  . ABDOMINAL HYSTERECTOMY    . CARDIAC CATHETERIZATION    .  CHOLECYSTECTOMY    . COLONOSCOPY    . COLONOSCOPY N/A 11/12/2014   Procedure: COLONOSCOPY;  Surgeon: Manya Silvas, MD;  Location: [facility] ENDOSCOPY;  Service: Endoscopy;  Laterality: N/A;  . DIAGNOSTIC LAPAROSCOPY     paraesphogeal hernia  . ESOPHAGOGASTRODUODENOSCOPY    . ESOPHAGOGASTRODUODENOSCOPY (EGD) WITH PROPOFOL N/A 04/16/2017   Procedure: ESOPHAGOGASTRODUODENOSCOPY (EGD) WITH PROPOFOL;  Surgeon: Manya Silvas, MD;  Location: [facility] ENDOSCOPY;  Service: Endoscopy;  Laterality: N/A;  . HERNIA REPAIR     inguinal hernia    Family History  Problem Relation Age of Onset  . Diabetes Mother   . Parkinson's disease Mother   . Cancer Father        Pancreatic  . Cancer Sister        Lung  . Diabetes Sister   . Alzheimer's disease Paternal Aunt        x2 sisters  . Diabetes Sister   . Alzheimer's disease Sister   . Tics Sister   . Breast cancer Neg Hx     No Known Allergies   Review of Systems   Review of Systems: Negative Unless Checked Constitutional: [] Weight loss  [] Fever  [] Chills Cardiac: [] Chest pain   []  Atrial Fibrillation  [] Palpitations   [] Shortness of breath when laying flat   [] Shortness of breath with exertion. Vascular:  [] Pain in legs with walking   [x] Pain in legs with standing  [] History of DVT   [] Phlebitis   [] Swelling in legs   [x] Varicose veins   [] Non-healing ulcers Pulmonary:   [] Uses home oxygen   [] Productive cough   [] Hemoptysis   [] Wheeze  [] COPD   [] Asthma Neurologic:  [] Dizziness   [] Seizures   [] History of stroke   [] History of TIA  [] Aphasia   [] Vissual changes   [] Weakness or numbness in arm   [] Weakness or numbness in leg Musculoskeletal:   [] Joint swelling   [] Joint pain   [] Low back pain  []  History of Knee Replacement Hematologic:  [] Easy bruising  [] Easy bleeding   [] Hypercoagulable state   [] Anemic Gastrointestinal:  [] Diarrhea   [] Vomiting  [] Gastroesophageal reflux/heartburn   [] Difficulty swallowing. Genitourinary:  [] Chronic kidney  disease   [] Difficult urination  [] Anuric   [] Blood in urine Skin:  [] Rashes   [] Ulcers  Psychological:  [] History of anxiety   []  History of major depression  []  Memory Difficulties     Objective:   Physical Exam  BP 126/73 (BP Location: Right Arm, Patient Position: Sitting)   Pulse 80   Resp 17   Ht 5\' 4"  (1.626 m)   Wt 120 lb (54.4 kg)   BMI 20.60 kg/m   Gen: WD/WN, NAD Head:  [place]/AT, No temporalis wasting.  Ear/Nose/Throat: Hearing grossly intact, nares w/o erythema or drainage Eyes: PER, EOMI, sclera nonicteric.  Neck: Supple, no masses.  No JVD.  Pulmonary:  Good air movement, no use of accessory muscles.  Cardiac: RRR Vascular: medium-small varicosities, L>R Vessel Right Left  Radial Palpable Palpable  Dorsalis Pedis Palpable Palpable  Posterior Tibial Palpable Palpable   Gastrointestinal: soft, non-distended. No guarding/no peritoneal signs.  Musculoskeletal: M/S 5/5 throughout.  No deformity or atrophy.  Neurologic: Pain and light touch intact in extremities.  Symmetrical.  Speech is fluent. Motor exam as listed above. Psychiatric: Judgment intact, Mood & affect appropriate for pt's clinical situation. Dermatologic: No Venous rashes. No Ulcers Noted.  No changes consistent with cellulitis. Lymph : No Cervical lymphadenopathy, no lichenification or skin changes of chronic lymphedema.      Assessment & Plan:   1. Varicose veins of both lower extremities with pain The patient also underwent bilateral venous reflux study, the right lower extremity has reflux in the common femoral vein and saphenofemoral junction.  At the left lower extremity has reflux in the great saphenous vein and saphenofemoral junction.  There are no DVTs or superficial venous thrombosis in either lower extremity.    Recommend:  The patient has large symptomatic varicose veins that are painful and associated with swelling.  I have had a long discussion with the patient regarding  varicose veins  and why they cause symptoms.  Patient will continue wearing graduated compression stockings class 1 on a daily basis, beginning first thing in the morning and removing them in the evening. The patient is instructed specifically not to sleep in the stockings.    The patient  will also continue  using over-the-counter analgesics such as Motrin 600 mg po TID to help control the symptoms.    In addition, behavioral modification including elevation during the day will be continued.     Pending the results of these changes the  patient will be reevaluated in one month.     Further plans will be based on whether conservative therapies are successful at eliminating the pain and swelling.   2. Essential hypertension Continue antihypertensive medications as already ordered, these medications have been reviewed and there are no changes at this time.   3. Hyperlipidemia, unspecified hyperlipidemia type Continue statin as ordered and reviewed, no changes at this time    Current Outpatient Medications on File Prior to Visit  Medication Sig Dispense Refill  . aspirin 81 MG tablet Take 81 mg by mouth daily.    . cholestyramine light 4 G POWD Take by mouth 1 day or 1 dose.    . clobetasol ointment (TEMOVATE) 6.94 % Apply 1 application topically 2 (two) times daily as needed. For rash. Avoid face. 180 g 1  . conjugated estrogens (PREMARIN) vaginal cream Place 1 Applicatorful vaginally 2 (two) times a week. Dispense a 90 day supply. 60 g 2  . Cyanocobalamin (VITAMIN B 12 PO) Take by mouth.    . fenofibrate 160 MG tablet TAKE ONE TABLET BY MOUTH ONCE DAILY 90 tablet 3  . ferrous sulfate 325 (65 FE) MG tablet Take 325 mg by mouth 2 (two) times daily with a meal.    . ketoconazole (NIZORAL) 2 % cream Apply 1 application topically daily as needed.     . meclizine (ANTIVERT) 25 MG tablet Take 1 tablet (25 mg total) by mouth 2 (two) times daily as needed for dizziness. 20 tablet 0  . methocarbamol (ROBAXIN) 500  MG tablet   1  . metoprolol tartrate (LOPRESSOR) 100 MG tablet TAKE ONE TABLET BY MOUTH TWICE DAILY 180 tablet 3  . Misc Natural Products (OSTEO BI-FLEX/5-LOXIN ADVANCED) TABS Take 1 tablet by mouth 2 (two) times daily.    . Multiple Vitamins-Minerals (PRESERVISION AREDS 2 PO) Take by mouth.    . mupirocin ointment (BACTROBAN) 2 % Place 1 application into the nose 2 (two) times daily. 22 g 1  . naproxen sodium (ANAPROX) 220 MG tablet Take 220 mg by mouth daily. Sometimes pt takes twice a day if needed from pain    . Omega-3 Fatty Acids (OMEGA 3 500 PO) Take 1 capsule by mouth daily.     . pantoprazole (PROTONIX) 40 MG tablet Take 1 tablet (40 mg total) by mouth daily. 90 tablet 1  . potassium chloride (K-DUR) 10 MEQ tablet Take 10 mEq by mouth daily.    . predniSONE (DELTASONE) 20 MG tablet 40 mg (2 pills) x 5 days with food, 20 mg (1 pill) x 5 days and 10 mg (1/2 pill x 5 days) 18 tablet 0  . Probiotic Product (PROBIOTIC DAILY) CAPS Take 1 capsule by mouth.    . simvastatin (ZOCOR) 40 MG tablet TAKE ONE TABLET BY MOUTH ONCE DAILY AT  6PM 90 tablet 3  . triamcinolone cream (KENALOG) 0.1 % Apply 1 application topically 2 (two) times daily as needed. For rash. Avoid face. 80 g 2   No current facility-administered medications on file prior to visit.     There are no Patient Instructions on file for this visit. Return in about 4 weeks (around 05/22/2018).   Kris Hartmann, NP

## 2018-04-26 ENCOUNTER — Other Ambulatory Visit: Payer: Self-pay | Admitting: Family Medicine

## 2018-05-02 DIAGNOSIS — H353131 Nonexudative age-related macular degeneration, bilateral, early dry stage: Secondary | ICD-10-CM | POA: Diagnosis not present

## 2018-05-23 ENCOUNTER — Encounter (INDEPENDENT_AMBULATORY_CARE_PROVIDER_SITE_OTHER): Payer: PPO

## 2018-05-23 ENCOUNTER — Encounter

## 2018-05-23 ENCOUNTER — Ambulatory Visit (INDEPENDENT_AMBULATORY_CARE_PROVIDER_SITE_OTHER): Payer: PPO | Admitting: Vascular Surgery

## 2018-05-27 ENCOUNTER — Ambulatory Visit (INDEPENDENT_AMBULATORY_CARE_PROVIDER_SITE_OTHER): Payer: PPO | Admitting: Vascular Surgery

## 2018-05-27 ENCOUNTER — Encounter (INDEPENDENT_AMBULATORY_CARE_PROVIDER_SITE_OTHER): Payer: Self-pay | Admitting: Vascular Surgery

## 2018-05-27 VITALS — BP 112/70 | HR 63 | Resp 14 | Ht 62.0 in | Wt 122.0 lb

## 2018-05-27 DIAGNOSIS — I10 Essential (primary) hypertension: Secondary | ICD-10-CM | POA: Diagnosis not present

## 2018-05-27 DIAGNOSIS — K219 Gastro-esophageal reflux disease without esophagitis: Secondary | ICD-10-CM | POA: Diagnosis not present

## 2018-05-27 DIAGNOSIS — I83813 Varicose veins of bilateral lower extremities with pain: Secondary | ICD-10-CM | POA: Diagnosis not present

## 2018-05-27 DIAGNOSIS — I2510 Atherosclerotic heart disease of native coronary artery without angina pectoris: Secondary | ICD-10-CM | POA: Diagnosis not present

## 2018-05-27 NOTE — Progress Notes (Signed)
MRN : [id number]  Lindsay Tran is a 72 y.o. ([birth year]) female who presents with chief complaint of  Chief Complaint  Patient presents with  . Follow-up    1 month f/u  .  History of Present Illness:   The patient returns for followup evaluation 3 months after the initial visit. The patient continues to have pain in the lower extremities with dependency. The pain is lessened with elevation. Graduated compression stockings, Class I (20-30 mmHg), have been worn but the stockings do not eliminate the leg pain. Over-the-counter analgesics do not improve the symptoms. The degree of discomfort continues to interfere with daily activities. The patient notes the pain in the legs is causing problems with daily exercise, at the workplace and even with household activities and maintenance such as standing in the kitchen preparing meals and doing dishes.   Venous ultrasound shows normal deep venous system, no evidence of acute or chronic DVT.  Superficial reflux is present in the left GSV  No outpatient medications have been marked as taking for the 05/27/18 encounter (Office Visit) with Delana Meyer, Dolores Lory, MD.    Past Medical History:  Diagnosis Date  . Abdominal pain   . Anemia   . Anxiety    adjustment disorder with anxiety  . Arthritis   . BPPV (benign paroxysmal positional vertigo)   . Cataract   . Chronic diarrhea   . Complication of anesthesia   . Coronary artery disease   . Dysphasia   . Gastric ulcer 1980's  . GERD (gastroesophageal reflux disease)   . High cholesterol   . Hypertension   . Osteoporosis   . Schatzki's ring 05/02/2013  . Scoliosis     Past Surgical History:  Procedure Laterality Date  . ABDOMINAL HYSTERECTOMY    . CARDIAC CATHETERIZATION    . CHOLECYSTECTOMY    . COLONOSCOPY    . COLONOSCOPY N/A 11/12/2014   Procedure: COLONOSCOPY;  Surgeon: Manya Silvas, MD;  Location: [facility] ENDOSCOPY;  Service: Endoscopy;  Laterality: N/A;  . DIAGNOSTIC  LAPAROSCOPY     paraesphogeal hernia  . ESOPHAGOGASTRODUODENOSCOPY    . ESOPHAGOGASTRODUODENOSCOPY (EGD) WITH PROPOFOL N/A 04/16/2017   Procedure: ESOPHAGOGASTRODUODENOSCOPY (EGD) WITH PROPOFOL;  Surgeon: Manya Silvas, MD;  Location[facility] ENDOSCOPY;  Service: Endoscopy;  Laterality: N/A;  . HERNIA REPAIR     inguinal hernia    Social History Social History   Tobacco Use  . Smoking status: Never Smoker  . Smokeless tobacco: Never Used  Substance Use Topics  . Alcohol use: No    Alcohol/week: 0.0 standard drinks  . Drug use: No    Family History Family History  Problem Relation Age of Onset  . Diabetes Mother   . Parkinson's disease Mother   . Cancer Father        Pancreatic  . Cancer Sister        Lung  . Diabetes Sister   . Alzheimer's disease Paternal Aunt        x2 sisters  . Diabetes Sister   . Alzheimer's disease Sister   . Tics Sister   . Breast cancer Neg Hx     No Known Allergies   REVIEW OF SYSTEMS (Negative unless checked)  Constitutional: [] Weight loss  [] Fever  [] Chills Cardiac: [] Chest pain   [] Chest pressure   [] Palpitations   [] Shortness of breath when laying flat   [] Shortness of breath with exertion. Vascular:  [] Pain in legs with walking   [x] Pain in legs with  dependency  [] History of DVT   [] Phlebitis   [x] Swelling in legs   [x] Varicose veins   [] Non-healing ulcers Pulmonary:   [] Uses home oxygen   [] Productive cough   [] Hemoptysis   [] Wheeze  [] COPD   [] Asthma Neurologic:  [] Dizziness   [] Seizures   [] History of stroke   [] History of TIA  [] Aphasia   [] Vissual changes   [] Weakness or numbness in arm   [] Weakness or numbness in leg Musculoskeletal:   [] Joint swelling   [] Joint pain   [] Low back pain Hematologic:  [] Easy bruising  [] Easy bleeding   [] Hypercoagulable state   [] Anemic Gastrointestinal:  [] Diarrhea   [] Vomiting  [] Gastroesophageal reflux/heartburn   [] Difficulty swallowing. Genitourinary:  [] Chronic kidney disease   [] Difficult  urination  [] Frequent urination   [] Blood in urine Skin:  [x] Rashes   [] Ulcers  Psychological:  [] History of anxiety   []  History of major depression.  Physical Examination  Vitals:   05/27/18 1102  BP: 112/70  Pulse: 63  Resp: 14  Weight: 122 lb (55.3 kg)  Height: 5\' 2"  (1.575 m)   Body mass index is 22.31 kg/m. Gen: WD/WN, NAD Head: [place]/AT, No temporalis wasting.  Ear/Nose/Throat: Hearing grossly intact, nares w/o erythema or drainage Eyes: PER, EOMI, sclera nonicteric.  Neck: Supple, no large masses.   Pulmonary:  Good air movement, no audible wheezing bilaterally, no use of accessory muscles.  Cardiac: RRR, no JVD Vascular: Large varicosities present extensively greater than 10 mm left.  Moderate venous stasis changes to the legs bilaterally.  2+ soft pitting edema Vessel Right Left  Radial Palpable Palpable  Gastrointestinal: Non-distended. No guarding/no peritoneal signs.  Musculoskeletal: M/S 5/5 throughout.  No deformity or atrophy.  Neurologic: CN 2-12 intact. Symmetrical.  Speech is fluent. Motor exam as listed above. Psychiatric: Judgment intact, Mood & affect appropriate for pt's clinical situation. Dermatologic: No rashes or ulcers noted.  No changes consistent with cellulitis. Lymph : No lichenification or skin changes of chronic lymphedema.  CBC Lab Results  Component Value Date   WBC 4.7 12/14/2017   HGB 11.9 (L) 12/14/2017   HCT 35.0 12/14/2017   MCV 89.8 12/14/2017   PLT 347 12/14/2017    BMET    Component Value Date/Time   NA 137 09/20/2017 1130   K 4.3 09/20/2017 1130   CL 102 09/20/2017 1130   CO2 31 09/20/2017 1130   GLUCOSE 97 09/20/2017 1130   BUN 20 09/20/2017 1130   CREATININE 0.65 09/20/2017 1130   CALCIUM 9.5 09/20/2017 1130   CrCl cannot be calculated (Patient's most recent lab result is older than the maximum 21 days allowed.).  COAG No results found for: INR, PROTIME  Radiology No results found.   Assessment/Plan 1.  Varicose veins of both lower extremities with pain Recommend  I have reviewed my previous  discussion with the patient regarding  varicose veins and why they cause symptoms. Patient will continue  wearing graduated compression stockings class 1 on a daily basis, beginning first thing in the morning and removing them in the evening.    In addition, behavioral modification including elevation during the day was again discussed and this will continue.  The patient has utilized over the counter pain medications and has been exercising.  However, at this time conservative therapy has not alleviated the patient's symptoms of leg pain and swelling  Recommend: laser ablation of the left great saphenous vein to eliminate the symptoms of pain and swelling of the lower extremities caused by the  severe superficial venous reflux disease.   2. Essential hypertension Continue antihypertensive medications as already ordered, these medications have been reviewed and there are no changes at this time.   3. CAD in native artery Continue cardiac and antihypertensive medications as already ordered and reviewed, no changes at this time.  Continue statin as ordered and reviewed, no changes at this time  Nitrates PRN for chest pain   4. Gastroesophageal reflux disease without esophagitis Continue PPI as already ordered, this medication has been reviewed and there are no changes at this time.  Avoidence of caffeine and alcohol  Moderate elevation of the head of the bed    Hortencia Pilar, MD  05/27/2018 11:24 AM

## 2018-06-16 ENCOUNTER — Other Ambulatory Visit: Payer: Self-pay | Admitting: *Deleted

## 2018-06-16 DIAGNOSIS — D473 Essential (hemorrhagic) thrombocythemia: Secondary | ICD-10-CM

## 2018-06-16 DIAGNOSIS — D75839 Thrombocytosis, unspecified: Secondary | ICD-10-CM

## 2018-06-18 ENCOUNTER — Inpatient Hospital Stay: Payer: PPO | Attending: Oncology | Admitting: Oncology

## 2018-06-18 ENCOUNTER — Encounter: Payer: Self-pay | Admitting: Oncology

## 2018-06-18 ENCOUNTER — Inpatient Hospital Stay: Payer: PPO

## 2018-06-18 VITALS — BP 133/81 | HR 63 | Temp 97.7°F | Resp 18 | Ht 62.0 in | Wt 122.5 lb

## 2018-06-18 DIAGNOSIS — D649 Anemia, unspecified: Secondary | ICD-10-CM | POA: Diagnosis not present

## 2018-06-18 DIAGNOSIS — D75839 Thrombocytosis, unspecified: Secondary | ICD-10-CM

## 2018-06-18 DIAGNOSIS — D473 Essential (hemorrhagic) thrombocythemia: Secondary | ICD-10-CM | POA: Insufficient documentation

## 2018-06-18 DIAGNOSIS — M1990 Unspecified osteoarthritis, unspecified site: Secondary | ICD-10-CM | POA: Diagnosis not present

## 2018-06-18 DIAGNOSIS — R5381 Other malaise: Secondary | ICD-10-CM | POA: Diagnosis not present

## 2018-06-18 DIAGNOSIS — K219 Gastro-esophageal reflux disease without esophagitis: Secondary | ICD-10-CM

## 2018-06-18 DIAGNOSIS — Z79899 Other long term (current) drug therapy: Secondary | ICD-10-CM | POA: Diagnosis not present

## 2018-06-18 DIAGNOSIS — I10 Essential (primary) hypertension: Secondary | ICD-10-CM

## 2018-06-18 DIAGNOSIS — R5383 Other fatigue: Secondary | ICD-10-CM | POA: Diagnosis not present

## 2018-06-18 DIAGNOSIS — E7800 Pure hypercholesterolemia, unspecified: Secondary | ICD-10-CM | POA: Insufficient documentation

## 2018-06-18 DIAGNOSIS — Z7982 Long term (current) use of aspirin: Secondary | ICD-10-CM | POA: Insufficient documentation

## 2018-06-18 DIAGNOSIS — I2510 Atherosclerotic heart disease of native coronary artery without angina pectoris: Secondary | ICD-10-CM | POA: Insufficient documentation

## 2018-06-18 LAB — CBC WITH DIFFERENTIAL/PLATELET
ABS IMMATURE GRANULOCYTES: 0.01 10*3/uL (ref 0.00–0.07)
Basophils Absolute: 0 10*3/uL (ref 0.0–0.1)
Basophils Relative: 1 %
EOS PCT: 2 %
Eosinophils Absolute: 0.1 10*3/uL (ref 0.0–0.5)
HEMATOCRIT: 36.9 % (ref 36.0–46.0)
HEMOGLOBIN: 11.5 g/dL — AB (ref 12.0–15.0)
Immature Granulocytes: 0 %
LYMPHS PCT: 20 %
Lymphs Abs: 1 10*3/uL (ref 0.7–4.0)
MCH: 29 pg (ref 26.0–34.0)
MCHC: 31.2 g/dL (ref 30.0–36.0)
MCV: 93.2 fL (ref 80.0–100.0)
MONOS PCT: 11 %
Monocytes Absolute: 0.5 10*3/uL (ref 0.1–1.0)
NEUTROS ABS: 3.4 10*3/uL (ref 1.7–7.7)
Neutrophils Relative %: 66 %
Platelets: 282 10*3/uL (ref 150–400)
RBC: 3.96 MIL/uL (ref 3.87–5.11)
RDW: 13.4 % (ref 11.5–15.5)
WBC: 5.1 10*3/uL (ref 4.0–10.5)
nRBC: 0 % (ref 0.0–0.2)

## 2018-06-18 NOTE — Progress Notes (Signed)
No new changes noted today 

## 2018-06-20 NOTE — Progress Notes (Signed)
Hematology/Oncology Consult note [facility]  Telephone:([phone] [phone]  Patient Care Team: Leone Haven, MD as PCP - General (Family Medicine)   Name of the patient: Lindsay Tran  [id number]  1946-05-21   Date of visit: 06/20/18  Diagnosis- thrombocytosis likely secondary etiology is unclear- resolved  Chief complaint/ Reason for visit-routine follow-up of thrombocytosis  Heme/Onc history: patient is a 72 year old female who was been referred to [place] for evaluation and management of thrombocytosis. 2 prior values over the last 1 year had shown that she has mild isolated thrombocytosis in the absence of high white count and high hemoglobin. Her prior platelet count in 2016 April was normal at 409. No prior h/o thrombosis or MI or strokes  Results of blood work from the 16th 2018 were as follows: CBC showed white count of 6.8, H&H of 12.1/36.2 with a platelet count of 446. Testing for BCR able was negative. Jak 2 CALR and MPL testing was negative. Ferritin was low at 15. Iron studies showed a low iron saturation of 9% an elevated TIBC of 514.esr was normal   Interval history-overall she is doing well and denies any specific complaints today.  She has mild fatigue.  Her appetite is good and her weight is stable.  ECOG PS- 1 Pain scale- 0 Opioid associated constipation- no  Review of systems- Review of Systems  Constitutional: Positive for malaise/fatigue. Negative for chills, fever and weight loss.  HENT: Negative for congestion, ear discharge and nosebleeds.   Eyes: Negative for blurred vision.  Respiratory: Negative for cough, hemoptysis, sputum production, shortness of breath and wheezing.   Cardiovascular: Negative for chest pain, palpitations, orthopnea and claudication.  Gastrointestinal: Negative for abdominal pain, blood in stool, constipation, diarrhea, heartburn, melena, nausea and vomiting.  Genitourinary: Negative for  dysuria, flank pain, frequency, hematuria and urgency.  Musculoskeletal: Negative for back pain, joint pain and myalgias.  Skin: Negative for rash.  Neurological: Negative for dizziness, tingling, focal weakness, seizures, weakness and headaches.  Endo/Heme/Allergies: Does not bruise/bleed easily.  Psychiatric/Behavioral: Negative for depression and suicidal ideas. The patient does not have insomnia.        No Known Allergies   Past Medical History:  Diagnosis Date  . Abdominal pain   . Anemia   . Anxiety    adjustment disorder with anxiety  . Arthritis   . BPPV (benign paroxysmal positional vertigo)   . Cataract   . Chronic diarrhea   . Complication of anesthesia   . Coronary artery disease   . Dysphasia   . Gastric ulcer 1980's  . GERD (gastroesophageal reflux disease)   . High cholesterol   . Hypertension   . Osteoporosis   . Schatzki's ring 05/02/2013  . Scoliosis      Past Surgical History:  Procedure Laterality Date  . ABDOMINAL HYSTERECTOMY    . CARDIAC CATHETERIZATION    . CHOLECYSTECTOMY    . COLONOSCOPY    . COLONOSCOPY N/A 11/12/2014   Procedure: COLONOSCOPY;  Surgeon: Manya Silvas, MD;  Location: [facility] ENDOSCOPY;  Service: Endoscopy;  Laterality: N/A;  . DIAGNOSTIC LAPAROSCOPY     paraesphogeal hernia  . ESOPHAGOGASTRODUODENOSCOPY    . ESOPHAGOGASTRODUODENOSCOPY (EGD) WITH PROPOFOL N/A 04/16/2017   Procedure: ESOPHAGOGASTRODUODENOSCOPY (EGD) WITH PROPOFOL;  Surgeon: Manya Silvas, MD;  Location: [facility] ENDOSCOPY;  Service: Endoscopy;  Laterality: N/A;  . HERNIA REPAIR     inguinal hernia    Social History   Socioeconomic History  . Marital  status: Single    Spouse name: Not on file  . Number of children: Not on file  . Years of education: Not on file  . Highest education level: Not on file  Occupational History  . Not on file  Social Needs  . Financial resource strain: Not hard at all  . Food insecurity:    Worry: Never true     Inability: Never true  . Transportation needs:    Medical: No    Non-medical: No  Tobacco Use  . Smoking status: Never Smoker  . Smokeless tobacco: Never Used  Substance and Sexual Activity  . Alcohol use: No    Alcohol/week: 0.0 standard drinks  . Drug use: No  . Sexual activity: Never  Lifestyle  . Physical activity:    Days per week: Not on file    Minutes per session: Not on file  . Stress: Not on file  Relationships  . Social connections:    Talks on phone: Not on file    Gets together: Not on file    Attends religious service: Not on file    Active member of club or organization: Not on file    Attends meetings of clubs or organizations: Not on file    Relationship status: Not on file  . Intimate partner violence:    Fear of current or ex partner: Not on file    Emotionally abused: Not on file    Physically abused: Not on file    Forced sexual activity: Not on file  Other Topics Concern  . Not on file  Social History Narrative   Divorced   Retired as a Scientist, research (medical)   12 + years of school     Family History  Problem Relation Age of Onset  . Diabetes Mother   . Parkinson's disease Mother   . Cancer Father        Pancreatic  . Cancer Sister        Lung  . Diabetes Sister   . Alzheimer's disease Paternal Aunt        x2 sisters  . Diabetes Sister   . Alzheimer's disease Sister   . Tics Sister   . Breast cancer Neg Hx      Current Outpatient Medications:  .  aspirin 81 MG tablet, Take 81 mg by mouth daily., Disp: , Rfl:  .  cholestyramine light 4 G POWD, Take by mouth 1 day or 1 dose., Disp: , Rfl:  .  clobetasol ointment (TEMOVATE) 6.21 %, Apply 1 application topically 2 (two) times daily as needed. For rash. Avoid face., Disp: 180 g, Rfl: 1 .  Cyanocobalamin (VITAMIN B 12 PO), Take by mouth., Disp: , Rfl:  .  fenofibrate 160 MG tablet, TAKE ONE TABLET BY MOUTH ONCE DAILY, Disp: 90 tablet, Rfl: 3 .  metoprolol tartrate (LOPRESSOR) 100 MG tablet, TAKE  ONE TABLET BY MOUTH TWICE DAILY, Disp: 180 tablet, Rfl: 3 .  Misc Natural Products (OSTEO BI-FLEX/5-LOXIN ADVANCED) TABS, Take 1 tablet by mouth 2 (two) times daily., Disp: , Rfl:  .  Multiple Vitamins-Minerals (PRESERVISION AREDS 2 PO), Take by mouth., Disp: , Rfl:  .  mupirocin ointment (BACTROBAN) 2 %, Place 1 application into the nose 2 (two) times daily., Disp: 22 g, Rfl: 1 .  Omega-3 Fatty Acids (OMEGA 3 500 PO), Take 1 capsule by mouth daily. , Disp: , Rfl:  .  pantoprazole (PROTONIX) 40 MG tablet, TAKE 1 TABLET BY MOUTH ONCE DAILY, Disp: 90  tablet, Rfl: 1 .  potassium chloride (K-DUR) 10 MEQ tablet, Take 10 mEq by mouth daily., Disp: , Rfl:  .  Probiotic Product (PROBIOTIC DAILY) CAPS, Take 1 capsule by mouth., Disp: , Rfl:  .  simvastatin (ZOCOR) 40 MG tablet, TAKE ONE TABLET BY MOUTH ONCE DAILY AT  6PM, Disp: 90 tablet, Rfl: 3 .  conjugated estrogens (PREMARIN) vaginal cream, Place 1 Applicatorful vaginally 2 (two) times a week. Dispense a 90 day supply. (Patient not taking: Reported on 06/18/2018), Disp: 60 g, Rfl: 2 .  ferrous sulfate 325 (65 FE) MG tablet, Take 325 mg by mouth 2 (two) times daily with a meal., Disp: , Rfl:  .  ketoconazole (NIZORAL) 2 % cream, Apply 1 application topically daily as needed. , Disp: , Rfl:  .  meclizine (ANTIVERT) 25 MG tablet, Take 1 tablet (25 mg total) by mouth 2 (two) times daily as needed for dizziness. (Patient not taking: Reported on 06/18/2018), Disp: 20 tablet, Rfl: 0 .  methocarbamol (ROBAXIN) 500 MG tablet, , Disp: , Rfl: 1 .  naproxen sodium (ANAPROX) 220 MG tablet, Take 220 mg by mouth daily. Sometimes pt takes twice a day if needed from pain, Disp: , Rfl:  .  predniSONE (DELTASONE) 20 MG tablet, 40 mg (2 pills) x 5 days with food, 20 mg (1 pill) x 5 days and 10 mg (1/2 pill x 5 days) (Patient not taking: Reported on 06/18/2018), Disp: 18 tablet, Rfl: 0 .  triamcinolone cream (KENALOG) 0.1 %, Apply 1 application topically 2 (two) times daily  as needed. For rash. Avoid face. (Patient not taking: Reported on 06/18/2018), Disp: 80 g, Rfl: 2  Physical exam:  Vitals:   06/18/18 1150  BP: 133/81  Pulse: 63  Resp: 18  Temp: 97.7 F (36.5 C)  TempSrc: Tympanic  SpO2: 97%  Weight: 122 lb 8 oz (55.6 kg)  Height: '5\' 2"'$  (1.575 m)   Physical Exam Constitutional:      General: She is not in acute distress. HENT:     Head: Normocephalic and atraumatic.  Eyes:     Pupils: Pupils are equal, round, and reactive to light.  Neck:     Musculoskeletal: Normal range of motion.  Cardiovascular:     Rate and Rhythm: Normal rate and regular rhythm.     Heart sounds: Normal heart sounds.  Pulmonary:     Effort: Pulmonary effort is normal.     Breath sounds: Normal breath sounds.  Abdominal:     General: Bowel sounds are normal.     Palpations: Abdomen is soft.  Skin:    General: Skin is warm and dry.  Neurological:     Mental Status: She is oriented to person, place, and time.      CMP Latest Ref Rng & Units 11/19/2017  Glucose 70 - 99 mg/dL -  BUN 6 - 23 mg/dL -  Creatinine 0.40 - 1.20 mg/dL -  Sodium 135 - 145 mEq/L -  Potassium 3.5 - 5.1 mEq/L -  Chloride 96 - 112 mEq/L -  CO2 19 - 32 mEq/L -  Calcium 8.4 - 10.5 mg/dL -  Total Protein 6.0 - 8.3 g/dL 6.3  Total Bilirubin 0.2 - 1.2 mg/dL 0.7  Alkaline Phos 39 - 117 U/L 34(L)  AST 0 - 37 U/L 29  ALT 0 - 35 U/L 20   CBC Latest Ref Rng & Units 06/18/2018  WBC 4.0 - 10.5 K/uL 5.1  Hemoglobin 12.0 - 15.0 g/dL 11.5(L)  Hematocrit  36.0 - 46.0 % 36.9  Platelets 150 - 400 K/uL 282     Assessment and plan- Patient is a 72 y.o. female referred for thrombocytosis likely secondary now resolved  1.  Thrombocytosis: Patient's platelet count was in the high 400s between 20 17-20 18.  She did have some evidence of iron deficiency for which she started taking oral iron.  That may have been the cause of her thrombocytosis.  Since December 2018 her platelet counts have now been normal  and she therefore does not require hematology follow-up at this time.  2.  I do notice that she has a mild anemia with a hemoglobin of 11.5 noted on today's labs.  Her baseline hemoglobin runs around 12 and therefore 11.5 is not too far from her baseline.  She can therefore follow-up with Dr. Caryl Bis at this time and if there is any concern for worsening anemia in the future she can be referred back to [place]   Visit Diagnosis 1. Thrombocytosis ([place])   2. Normocytic anemia      Dr. Randa Evens, MD, MPH [facility] [phone] 06/20/2018 8:28 AM

## 2018-07-12 ENCOUNTER — Telehealth (INDEPENDENT_AMBULATORY_CARE_PROVIDER_SITE_OTHER): Payer: Self-pay

## 2018-07-12 NOTE — Telephone Encounter (Signed)
Patient called wanting to know why she had to have someone stay with her for 24 hours after her laser procedure and why she couldn't drive. I attempted to explain that because she will take xanax before her procedure in our office having someone to drive her and stay with her for 24 hours is because she will be considered under the influence with the xanax and we don't want her getting hurt. The patient stated she probably won't have anyone staying with her because she doesn't have anywhere for them to sleep.

## 2018-07-17 ENCOUNTER — Ambulatory Visit (INDEPENDENT_AMBULATORY_CARE_PROVIDER_SITE_OTHER): Payer: PPO | Admitting: Family Medicine

## 2018-07-17 ENCOUNTER — Encounter: Payer: Self-pay | Admitting: Family Medicine

## 2018-07-17 VITALS — BP 116/78 | HR 64 | Temp 97.8°F | Ht 62.0 in | Wt 122.2 lb

## 2018-07-17 DIAGNOSIS — H8113 Benign paroxysmal vertigo, bilateral: Secondary | ICD-10-CM | POA: Diagnosis not present

## 2018-07-17 DIAGNOSIS — K219 Gastro-esophageal reflux disease without esophagitis: Secondary | ICD-10-CM | POA: Diagnosis not present

## 2018-07-17 DIAGNOSIS — M419 Scoliosis, unspecified: Secondary | ICD-10-CM | POA: Diagnosis not present

## 2018-07-17 DIAGNOSIS — I10 Essential (primary) hypertension: Secondary | ICD-10-CM

## 2018-07-17 DIAGNOSIS — I83813 Varicose veins of bilateral lower extremities with pain: Secondary | ICD-10-CM

## 2018-07-17 DIAGNOSIS — K5900 Constipation, unspecified: Secondary | ICD-10-CM | POA: Diagnosis not present

## 2018-07-17 DIAGNOSIS — E785 Hyperlipidemia, unspecified: Secondary | ICD-10-CM | POA: Diagnosis not present

## 2018-07-17 LAB — BASIC METABOLIC PANEL
BUN: 17 mg/dL (ref 6–23)
CALCIUM: 9.4 mg/dL (ref 8.4–10.5)
CO2: 29 mEq/L (ref 19–32)
CREATININE: 0.82 mg/dL (ref 0.40–1.20)
Chloride: 102 mEq/L (ref 96–112)
GFR: 72.25 mL/min (ref >60.00)
Glucose, Bld: 98 mg/dL (ref 70–99)
Potassium: 4.1 mEq/L (ref 3.5–5.1)
Sodium: 137 mEq/L (ref 135–145)

## 2018-07-17 NOTE — Assessment & Plan Note (Signed)
Patient reports she would like to see a specialist at [facility] once her vascular surgery is completed.

## 2018-07-17 NOTE — Assessment & Plan Note (Signed)
Adequately controlled.  She is not orthostatic on exam today.  She will continue her current medication.  She will monitor her lightheadedness/vertigo and if worsening or recurring let us know.

## 2018-07-17 NOTE — Progress Notes (Signed)
Tommi Rumps, MD Phone: [phone]  Lindsay Tran is a 73 y.o. female who presents today for follow-up.  CC: Hypertension, hyperlipidemia, GERD, varicose veins, constipation, weight loss, vertigo  Hypertension: Typically in a good range though occasionally does drop into the 90s over 60s.  She does take metoprolol.  No chest pain or shortness of breath.  She does note chronic bilateral lower extremity edema for which she is following with vascular surgery.  She plans to have a vein surgery tomorrow.  Vertigo: She does note occasional spinning sensation though she saw ENT and reports they advised her she had no inner ear issues.  She states they felt it was a blood pressure issue.  Last occurred last week though does not occur very frequently.  Hyperlipidemia: Taking fenofibrate and simvastatin.  No right upper quadrant pain or myalgias.  GERD: Taking Protonix.  She denies any significant reflux symptoms.  No abdominal pain.  No blood in her stool.  Constipation: Notes she was having issues with this and having to strain and having small round balls of stool.  She was having daily bowel movements.  She started on a stool softener and this has helped with her symptoms significantly.  Weight loss: Patient has been attempting to lose weight.  She has switched to a low carbohydrate diet.  She is cut out sweets.  She tries to stay as active as she can.  Social History   Tobacco Use  Smoking Status Never Smoker  Smokeless Tobacco Never Used     ROS see history of present illness  Objective  Physical Exam Vitals:   07/17/18 1053  BP: 116/78  Pulse: 64  Temp: 97.8 F (36.6 C)  SpO2: 95%    BP Readings from Last 3 Encounters:  07/17/18 116/78  06/18/18 133/81  05/27/18 112/70   Wt Readings from Last 3 Encounters:  07/17/18 122 lb 3.2 oz (55.4 kg)  06/18/18 122 lb 8 oz (55.6 kg)  05/27/18 122 lb (55.3 kg)    Physical Exam Constitutional:      General: She is not in  acute distress.    Appearance: She is not diaphoretic.  Cardiovascular:     Rate and Rhythm: Normal rate and regular rhythm.     Heart sounds: Normal heart sounds.  Pulmonary:     Effort: Pulmonary effort is normal.     Breath sounds: Normal breath sounds.  Abdominal:     General: Bowel sounds are normal. There is no distension.     Palpations: Abdomen is soft.     Tenderness: There is no abdominal tenderness.  Skin:    General: Skin is warm and dry.  Neurological:     Mental Status: She is alert.      Assessment/Plan: Please see individual problem list.  Benign essential HTN Adequately controlled.  She is not orthostatic on exam today.  She will continue her current medication.  She will monitor her lightheadedness/vertigo and if worsening or recurring let us know.  Varicose veins of both lower extremities with pain She will continue to see vascular surgery.  Acid reflux Continue Protonix.  Scoliosis Patient reports she would like to see a specialist at [facility] once her vascular surgery is completed.  Hyperlipidemia Continue current regimen.  Continue diet.  Encouraged exercise.  Constipation Improved with as needed Senokot.  She will monitor.  BPPV (benign paroxysmal positional vertigo) This potentially could be vertigo versus lightheadedness from her blood pressure.  She will monitor and if it is  recurrent or worsening let us know.   Orders Placed This Encounter  Procedures  . Basic Metabolic Panel (BMET)    No orders of the defined types were placed in this encounter.    Tommi Rumps, MD [place]

## 2018-07-17 NOTE — Assessment & Plan Note (Signed)
She will continue to see vascular surgery. 

## 2018-07-17 NOTE — Assessment & Plan Note (Signed)
This potentially could be vertigo versus lightheadedness from her blood pressure.  She will monitor and if it is recurrent or worsening let us know.

## 2018-07-17 NOTE — Assessment & Plan Note (Signed)
Improved with as needed Senokot.  She will monitor.

## 2018-07-17 NOTE — Assessment & Plan Note (Signed)
Continue Protonix °

## 2018-07-17 NOTE — Patient Instructions (Signed)
Nice to see you. We will check lab work today and contact you with the results. Please monitor your constipation if it does not continue to improve please let us know. Please monitor your lightheadedness and if this recurs please let us know.

## 2018-07-17 NOTE — Assessment & Plan Note (Addendum)
Continue current regimen.  Continue diet.  Encouraged exercise.

## 2018-07-18 ENCOUNTER — Ambulatory Visit (INDEPENDENT_AMBULATORY_CARE_PROVIDER_SITE_OTHER): Payer: PPO | Admitting: Vascular Surgery

## 2018-07-18 ENCOUNTER — Encounter (INDEPENDENT_AMBULATORY_CARE_PROVIDER_SITE_OTHER): Payer: Self-pay | Admitting: Vascular Surgery

## 2018-07-18 VITALS — BP 124/72 | HR 58 | Resp 14 | Ht 60.0 in | Wt 121.0 lb

## 2018-07-18 DIAGNOSIS — I83813 Varicose veins of bilateral lower extremities with pain: Secondary | ICD-10-CM | POA: Diagnosis not present

## 2018-07-18 NOTE — Progress Notes (Signed)
    MRN : [id number]  ANAB VIVAR is a 73 y.o. (September 23, 1945) female who presents with chief complaint of painful varicose veins.    The patient's left lower extremity was sterilely prepped and draped.  The ultrasound machine was used to visualize the left great saphenous vein throughout its course.  A segment of the GSV at the knee was selected for access.  The saphenous vein was accessed without difficulty using ultrasound guidance with a micropuncture needle.   An 0.018  wire was placed beyond the saphenofemoral junction through the sheath and the microneedle was removed.  The 65 cm sheath was then placed over the wire and the wire and dilator were removed.  The laser fiber was placed through the sheath and its tip was placed approximately 2 cm below the saphenofemoral junction.  Tumescent anesthesia was then created with a dilute lidocaine solution.  Laser energy was then delivered with constant withdrawal of the sheath and laser fiber.  Approximately 1076 Joules of energy were delivered over a length of 32 cm.  Sterile dressings were placed.  The patient tolerated the procedure well without complications.

## 2018-07-19 ENCOUNTER — Other Ambulatory Visit (INDEPENDENT_AMBULATORY_CARE_PROVIDER_SITE_OTHER): Payer: Self-pay | Admitting: Vascular Surgery

## 2018-07-19 DIAGNOSIS — Z9889 Other specified postprocedural states: Secondary | ICD-10-CM

## 2018-07-22 ENCOUNTER — Ambulatory Visit (INDEPENDENT_AMBULATORY_CARE_PROVIDER_SITE_OTHER): Payer: PPO

## 2018-07-22 DIAGNOSIS — Z9889 Other specified postprocedural states: Secondary | ICD-10-CM

## 2018-07-30 ENCOUNTER — Other Ambulatory Visit: Payer: Self-pay | Admitting: Family Medicine

## 2018-07-30 DIAGNOSIS — L409 Psoriasis, unspecified: Secondary | ICD-10-CM

## 2018-08-19 ENCOUNTER — Ambulatory Visit (INDEPENDENT_AMBULATORY_CARE_PROVIDER_SITE_OTHER): Payer: PPO | Admitting: Vascular Surgery

## 2018-08-19 ENCOUNTER — Encounter (INDEPENDENT_AMBULATORY_CARE_PROVIDER_SITE_OTHER): Payer: Self-pay | Admitting: Vascular Surgery

## 2018-08-19 VITALS — BP 124/77 | HR 63 | Resp 14 | Ht 60.0 in | Wt 123.4 lb

## 2018-08-19 DIAGNOSIS — I2510 Atherosclerotic heart disease of native coronary artery without angina pectoris: Secondary | ICD-10-CM | POA: Diagnosis not present

## 2018-08-19 DIAGNOSIS — M79605 Pain in left leg: Secondary | ICD-10-CM

## 2018-08-19 DIAGNOSIS — I83813 Varicose veins of bilateral lower extremities with pain: Secondary | ICD-10-CM

## 2018-08-19 DIAGNOSIS — M79604 Pain in right leg: Secondary | ICD-10-CM | POA: Diagnosis not present

## 2018-08-19 DIAGNOSIS — K219 Gastro-esophageal reflux disease without esophagitis: Secondary | ICD-10-CM | POA: Diagnosis not present

## 2018-08-19 DIAGNOSIS — I10 Essential (primary) hypertension: Secondary | ICD-10-CM

## 2018-08-19 NOTE — Progress Notes (Signed)
MRN : [id number]  Lindsay Tran is a 73 y.o. (Sep 14, 1945) female who presents with chief complaint of  Chief Complaint  Patient presents with  . Follow-up  .  History of Present Illness:   The patient returns to the office for followup status post laser ablation of the left saphenous vein on 07/18/2018.  The patient note significant improvement in the lower extremity pain but not resolution of the symptoms. The patient notes multiple residual varicosities bilaterally which continued to hurt with dependent positions and remained tender to palpation. The patient's swelling is minimally from preoperative status. The patient continues to wear graduated compression stockings on a daily basis but these are not eliminating the pain and discomfort. The patient continues to use over-the-counter anti-inflammatory medications to treat the pain and related symptoms but this has not given the patient relief. The patient notes the pain in the lower extremities is causing problems with daily exercise, problems at work and even with household activities such as preparing meals and doing dishes.  The patient is otherwise done well and there have been no complications related to the laser procedure or interval changes in the patient's overall   Post laser ultrasound shows successful ablation of the left GSV    Current Meds  Medication Sig  . aspirin 81 MG tablet Take 81 mg by mouth daily.  . clobetasol ointment (TEMOVATE) 4.09 % Apply 1 application topically 2 (two) times daily as needed. For rash. Avoid face.  . conjugated estrogens (PREMARIN) vaginal cream Place 1 Applicatorful vaginally 2 (two) times a week. Dispense a 90 day supply.  . Cyanocobalamin (VITAMIN B 12 PO) Take by mouth.  . fenofibrate 160 MG tablet TAKE 1 TABLET BY MOUTH ONCE DAILY  . ferrous sulfate 325 (65 FE) MG tablet Take 325 mg by mouth 2 (two) times daily with a meal.  . ketoconazole (NIZORAL) 2 % cream Apply 1 application topically  daily as needed.   . meclizine (ANTIVERT) 25 MG tablet Take 1 tablet (25 mg total) by mouth 2 (two) times daily as needed for dizziness.  . methocarbamol (ROBAXIN) 500 MG tablet   . metoprolol tartrate (LOPRESSOR) 100 MG tablet TAKE 1 TABLET BY MOUTH TWICE DAILY  . Misc Natural Products (OSTEO BI-FLEX/5-LOXIN ADVANCED) TABS Take 1 tablet by mouth 2 (two) times daily.  . Multiple Vitamins-Minerals (PRESERVISION AREDS 2 PO) Take by mouth.  . mupirocin ointment (BACTROBAN) 2 % Place 1 application into the nose 2 (two) times daily.  . naproxen sodium (ANAPROX) 220 MG tablet Take 220 mg by mouth daily. Sometimes pt takes twice a day if needed from pain  . Omega-3 Fatty Acids (OMEGA 3 500 PO) Take 1 capsule by mouth daily.   . pantoprazole (PROTONIX) 40 MG tablet TAKE 1 TABLET BY MOUTH ONCE DAILY  . potassium chloride (K-DUR) 10 MEQ tablet Take 10 mEq by mouth daily.  . Probiotic Product (PROBIOTIC DAILY) CAPS Take 1 capsule by mouth.  . simvastatin (ZOCOR) 40 MG tablet TAKE 1 TABLET BY MOUTH ONCE DAILY AT 6PM  . triamcinolone cream (KENALOG) 0.1 % APPLY TOPICALLY TWICE DAILY AS NEEDED FOR RASH. AVOID FACE    Past Medical History:  Diagnosis Date  . Abdominal pain   . Anemia   . Anxiety    adjustment disorder with anxiety  . Arthritis   . BPPV (benign paroxysmal positional vertigo)   . Cataract   . Chronic diarrhea   . Complication of anesthesia   . Coronary artery  disease   . Dysphasia   . Gastric ulcer 1980's  . GERD (gastroesophageal reflux disease)   . High cholesterol   . Hypertension   . Osteoporosis   . Schatzki's ring 05/02/2013  . Scoliosis     Past Surgical History:  Procedure Laterality Date  . ABDOMINAL HYSTERECTOMY    . CARDIAC CATHETERIZATION    . CHOLECYSTECTOMY    . COLONOSCOPY    . COLONOSCOPY N/A 11/12/2014   Procedure: COLONOSCOPY;  Surgeon: Manya Silvas, MD;  Location: [facility] ENDOSCOPY;  Service: Endoscopy;  Laterality: N/A;  . DIAGNOSTIC LAPAROSCOPY      paraesphogeal hernia  . ESOPHAGOGASTRODUODENOSCOPY    . ESOPHAGOGASTRODUODENOSCOPY (EGD) WITH PROPOFOL N/A 04/16/2017   Procedure: ESOPHAGOGASTRODUODENOSCOPY (EGD) WITH PROPOFOL;  Surgeon: Manya Silvas, MD;  Location: [facility] ENDOSCOPY;  Service: Endoscopy;  Laterality: N/A;  . HERNIA REPAIR     inguinal hernia    Social History Social History   Tobacco Use  . Smoking status: Never Smoker  . Smokeless tobacco: Never Used  Substance Use Topics  . Alcohol use: No    Alcohol/week: 0.0 standard drinks  . Drug use: No    Family History Family History  Problem Relation Age of Onset  . Diabetes Mother   . Parkinson's disease Mother   . Cancer Father        Pancreatic  . Cancer Sister        Lung  . Diabetes Sister   . Alzheimer's disease Paternal Aunt        x2 sisters  . Diabetes Sister   . Alzheimer's disease Sister   . Tics Sister   . Breast cancer Neg Hx     No Known Allergies   REVIEW OF SYSTEMS (Negative unless checked)  Constitutional: [] Weight loss  [] Fever  [] Chills Cardiac: [] Chest pain   [] Chest pressure   [] Palpitations   [] Shortness of breath when laying flat   [] Shortness of breath with exertion. Vascular:  [] Pain in legs with walking   [x] Pain in legs with standing  [] History of DVT   [] Phlebitis   [x] Swelling in legs   [x] Varicose veins   [] Non-healing ulcers Pulmonary:   [] Uses home oxygen   [] Productive cough   [] Hemoptysis   [] Wheeze  [] COPD   [] Asthma Neurologic:  [] Dizziness   [] Seizures   [] History of stroke   [] History of TIA  [] Aphasia   [] Vissual changes   [] Weakness or numbness in arm   [] Weakness or numbness in leg Musculoskeletal:   [] Joint swelling   [x] Joint pain   [] Low back pain Hematologic:  [] Easy bruising  [] Easy bleeding   [] Hypercoagulable state   [] Anemic Gastrointestinal:  [] Diarrhea   [] Vomiting  [] Gastroesophageal reflux/heartburn   [] Difficulty swallowing. Genitourinary:  [] Chronic kidney disease   [] Difficult urination   [] Frequent urination   [] Blood in urine Skin:  [] Rashes   [] Ulcers  Psychological:  [] History of anxiety   []  History of major depression.  Physical Examination  Vitals:   08/19/18 1109  BP: 124/77  Pulse: 63  Resp: 14  Weight: 123 lb 6.4 oz (56 kg)  Height: 5' (1.524 m)   Body mass index is 24.1 kg/m. Gen: WD/WN, NAD Head: [place]/AT, No temporalis wasting.  Ear/Nose/Throat: Hearing grossly intact, nares w/o erythema or drainage Eyes: PER, EOMI, sclera nonicteric.  Neck: Supple, no large masses.   Pulmonary:  Good air movement, no audible wheezing bilaterally, no use of accessory muscles.  Cardiac: RRR, no JVD Vascular: Large varicosities present extensively greater  than 10 mm left leg.  Mild venous stasis changes to the legs bilaterally.  2+ soft pitting edema Vessel Right Left  Radial Palpable Palpable  PT Palpable Palpable  DP Palpable Palpable  Gastrointestinal: Non-distended. No guarding/no peritoneal signs.  Musculoskeletal: M/S 5/5 throughout.  No deformity or atrophy.  Neurologic: CN 2-12 intact. Symmetrical.  Speech is fluent. Motor exam as listed above. Psychiatric: Judgment intact, Mood & affect appropriate for pt's clinical situation. Dermatologic: Mild venous rashes no ulcers noted.  No changes consistent with cellulitis. Lymph : No lichenification or skin changes of chronic lymphedema.  CBC Lab Results  Component Value Date   WBC 5.1 06/18/2018   HGB 11.5 (L) 06/18/2018   HCT 36.9 06/18/2018   MCV 93.2 06/18/2018   PLT 282 06/18/2018    BMET    Component Value Date/Time   NA 137 07/17/2018 1133   K 4.1 07/17/2018 1133   CL 102 07/17/2018 1133   CO2 29 07/17/2018 1133   GLUCOSE 98 07/17/2018 1133   BUN 17 07/17/2018 1133   CREATININE 0.82 07/17/2018 1133   CALCIUM 9.4 07/17/2018 1133   CrCl cannot be calculated (Patient's most recent lab result is older than the maximum 21 days allowed.).  COAG No results found for: INR, PROTIME  Radiology Vas  [place] Lower Ext Venous Post Ablation  Result Date: 07/22/2018  Lower Venous Study Comparison Study: none Performing Technologist: Concha Norway RVT  Examination Guidelines: A complete evaluation includes B-mode imaging, spectral Doppler, color Doppler, and power Doppler as needed of all accessible portions of each vessel. Bilateral testing is considered an integral part of a complete examination. Limited examinations for reoccurring indications may be performed as noted.  Left Venous Findings: +---------+---------------+---------+-----------+----------+-------+          CompressibilityPhasicitySpontaneityPropertiesSummary +---------+---------------+---------+-----------+----------+-------+ CFV      Full                                                 +---------+---------------+---------+-----------+----------+-------+ SFJ      Full                                                 +---------+---------------+---------+-----------+----------+-------+ FV Prox  Full                                                 +---------+---------------+---------+-----------+----------+-------+ FV Mid   Full                                                 +---------+---------------+---------+-----------+----------+-------+ FV DistalFull                                                 +---------+---------------+---------+-----------+----------+-------+ POP      Full                                                 +---------+---------------+---------+-----------+----------+-------+  Summary: Left: There is no evidence of deep vein thrombosis in the lower extremity.Successful vein closure.  *See table(s) above for measurements and observations. Electronically signed by Hortencia Pilar MD on 07/22/2018 at 5:10:08 PM.    Final      Assessment/Plan 1. Varicose veins of both lower extremities with pain Recommend:  The patient has had successful ablation of the previously incompetent  saphenous venous system but still has persistent symptoms of pain and swelling that are having a negative impact on daily life and daily activities.  Patient should undergo injection sclerotherapy to treat the residual varicosities.  The risks, benefits and alternative therapies were reviewed in detail with the patient.  All questions were answered.  The patient agrees to proceed with sclerotherapy at their convenience.  The patient will continue wearing the graduated compression stockings and using the over-the-counter pain medications to treat her symptoms.   2. Pain in both lower extremities See #1  3. CAD in native artery Continue cardiac and antihypertensive medications as already ordered and reviewed, no changes at this time.  Continue statin as ordered and reviewed, no changes at this time  Nitrates PRN for chest pain   4. Benign essential HTN Continue antihypertensive medications as already ordered, these medications have been reviewed and there are no changes at this time.   5. Gastroesophageal reflux disease without esophagitis Continue PPI as already ordered, this medication has been reviewed and there are no changes at this time.  Avoidence of caffeine and alcohol  Moderate elevation of the head of the bed     Hortencia Pilar, MD  08/19/2018 11:24 AM

## 2018-09-05 ENCOUNTER — Telehealth: Payer: Self-pay | Admitting: Family Medicine

## 2018-09-05 DIAGNOSIS — Z1239 Encounter for other screening for malignant neoplasm of breast: Secondary | ICD-10-CM

## 2018-09-08 NOTE — Telephone Encounter (Signed)
Please check with the patient to see if she has consistently been using this over the last several years as it has not been refilled since 2018.  Please also let her know that she needs to have a mammogram completed as well.  If she is willing I can place orders for the mammogram.  Thanks.

## 2018-09-10 NOTE — Telephone Encounter (Signed)
LMTCB

## 2018-09-11 ENCOUNTER — Encounter: Payer: Self-pay | Admitting: Family Medicine

## 2018-09-15 MED ORDER — MUPIROCIN 2 % EX OINT: 1.0000 "application " | TOPICAL_OINTMENT | Freq: Two times a day (BID) | CUTANEOUS | 1 refills | Status: DC

## 2018-09-15 NOTE — Telephone Encounter (Signed)
Noted. Sent to pharmacy. Please contact her to let her know she needs a mammogram. I have placed and order for this. She should contact [facility] to schedule this. Please provide her with the number to call. Thanks.

## 2018-09-17 ENCOUNTER — Ambulatory Visit (INDEPENDENT_AMBULATORY_CARE_PROVIDER_SITE_OTHER): Payer: PPO | Admitting: Nurse Practitioner

## 2018-09-17 ENCOUNTER — Encounter (INDEPENDENT_AMBULATORY_CARE_PROVIDER_SITE_OTHER): Payer: Self-pay | Admitting: Nurse Practitioner

## 2018-09-17 VITALS — BP 115/72 | HR 62 | Resp 10 | Ht 60.0 in | Wt 123.0 lb

## 2018-09-17 DIAGNOSIS — I83813 Varicose veins of bilateral lower extremities with pain: Secondary | ICD-10-CM | POA: Diagnosis not present

## 2018-09-17 NOTE — Telephone Encounter (Signed)
LM that prescriptions had been sent in. I asked that she call & schedule mammogram at [facility]. I also provided that number.

## 2018-09-17 NOTE — Telephone Encounter (Signed)
Sent to PCP to advise why another MM is needed before the 1 year mark. Thanks   Last MM was normal.

## 2018-09-17 NOTE — Telephone Encounter (Signed)
Pt states that she had her mammogram back in October and can only have one a year. She is wanting to know if there is a reason she should have another?

## 2018-09-17 NOTE — Progress Notes (Signed)
Varicose veins of left lower extremity with inflammation (454.1  I83.10) Current Plans   Indication: Patient presents with symptomatic varicose veins of the left lower extremity.   Procedure: Sclerotherapy using hypertonic saline mixed with 1% Lidocaine was performed on the left lower extremity. Compression wraps were placed. The patient tolerated the procedure well. 

## 2018-09-17 NOTE — Telephone Encounter (Signed)
Noted.  I must have overlooked the mammogram from October.  I apologize for this.  She does not need an additional mammogram.

## 2018-09-18 NOTE — Telephone Encounter (Signed)
Called and spoke with patient. Pt advised and voiced understanding.  

## 2018-10-04 ENCOUNTER — Ambulatory Visit: Payer: PPO

## 2018-10-08 ENCOUNTER — Ambulatory Visit (INDEPENDENT_AMBULATORY_CARE_PROVIDER_SITE_OTHER): Payer: PPO | Admitting: Nurse Practitioner

## 2018-10-29 ENCOUNTER — Ambulatory Visit (INDEPENDENT_AMBULATORY_CARE_PROVIDER_SITE_OTHER): Payer: PPO | Admitting: Nurse Practitioner

## 2018-10-30 ENCOUNTER — Other Ambulatory Visit: Payer: Self-pay | Admitting: Family Medicine

## 2018-10-30 DIAGNOSIS — L409 Psoriasis, unspecified: Secondary | ICD-10-CM

## 2019-01-14 ENCOUNTER — Other Ambulatory Visit: Payer: Self-pay | Admitting: Family Medicine

## 2019-01-14 ENCOUNTER — Other Ambulatory Visit: Payer: Self-pay

## 2019-01-14 MED ORDER — CLOBETASOL PROPIONATE 0.05 % EX OINT: 1.0000 "application " | TOPICAL_OINTMENT | Freq: Two times a day (BID) | CUTANEOUS | 1 refills | Status: DC | PRN

## 2019-01-15 ENCOUNTER — Other Ambulatory Visit: Payer: Self-pay

## 2019-01-15 ENCOUNTER — Encounter: Payer: Self-pay | Admitting: Family Medicine

## 2019-01-15 ENCOUNTER — Ambulatory Visit (INDEPENDENT_AMBULATORY_CARE_PROVIDER_SITE_OTHER): Payer: PPO

## 2019-01-15 ENCOUNTER — Ambulatory Visit (INDEPENDENT_AMBULATORY_CARE_PROVIDER_SITE_OTHER): Payer: PPO | Admitting: Family Medicine

## 2019-01-15 DIAGNOSIS — L409 Psoriasis, unspecified: Secondary | ICD-10-CM | POA: Diagnosis not present

## 2019-01-15 DIAGNOSIS — E611 Iron deficiency: Secondary | ICD-10-CM

## 2019-01-15 DIAGNOSIS — E785 Hyperlipidemia, unspecified: Secondary | ICD-10-CM | POA: Diagnosis not present

## 2019-01-15 DIAGNOSIS — I10 Essential (primary) hypertension: Secondary | ICD-10-CM

## 2019-01-15 DIAGNOSIS — Z0000 Encounter for general adult medical examination without abnormal findings: Secondary | ICD-10-CM

## 2019-01-15 DIAGNOSIS — R768 Other specified abnormal immunological findings in serum: Secondary | ICD-10-CM

## 2019-01-15 MED ORDER — CLOBETASOL PROPIONATE 0.05 % EX FOAM: Freq: Two times a day (BID) | CUTANEOUS | 0 refills | Status: DC

## 2019-01-15 NOTE — Assessment & Plan Note (Signed)
Stable.  Refills of clobetasol ointment and foam provided.

## 2019-01-15 NOTE — Assessment & Plan Note (Signed)
Continue current medications.   Check labs

## 2019-01-15 NOTE — Assessment & Plan Note (Signed)
Patient has been evaluated by rheumatology.

## 2019-01-15 NOTE — Patient Instructions (Addendum)
  Lindsay Tran , Thank you for taking time to come for your Medicare Wellness Visit. I appreciate your ongoing commitment to your health goals. Please review the following plan we discussed and let me know if I can assist you in the future.   These are the goals we discussed: Goals    . Increase physical activity     Walk for exercise Ride the exercise bike  150 minutes per week       This is a list of the screening recommended for you and due dates:  Health Maintenance  Topic Date Due  . Flu Shot  02/08/2019  . Colon Cancer Screening  11/11/2024  . Tetanus Vaccine  07/20/2025  . DEXA scan (bone density measurement)  Completed  .  Hepatitis C: One time screening is recommended by Center for Disease Control  (CDC) for  adults born from [birth year] through 1967.   Completed  . Pneumonia vaccines  Completed

## 2019-01-15 NOTE — Assessment & Plan Note (Signed)
No longer on iron supplement.  Will recheck labs.

## 2019-01-15 NOTE — Progress Notes (Signed)
Subjective:   Lindsay Tran is a 73 y.o. female who presents for Medicare Annual (Subsequent) preventive examination.  Review of Systems:  No ROS.  Medicare Wellness Virtual Visit.  Visual/audio telehealth visit, UTA vital signs.   See social history for additional risk factors.   Cardiac Risk Factors include: advanced age (>56men, >80 women);hypertension     Objective:     Vitals: There were no vitals taken for this visit.  There is no height or weight on file to calculate BMI.  Advanced Directives 01/15/2019 06/18/2018 10/02/2017 06/15/2017 05/01/2017 04/16/2017 02/13/2017  Does Patient Have a Medical Advance Directive? Yes No Yes Yes No No Yes  Type of Paramedic of [place];Living will - [place];Living will [place];Living will - - [place];Living will  Does patient want to make changes to medical advance directive? No - Patient declined - No - Patient declined - - - No - Patient declined  Copy of [place] in Chart? Yes - validated most recent copy scanned in chart (See row information) - Yes Yes - - No - copy requested  Would patient like information on creating a medical advance directive? - No - Patient declined - - - - -    Tobacco Social History   Tobacco Use  Smoking Status Never Smoker  Smokeless Tobacco Never Used     Counseling given: Not Answered   Clinical Intake:  Pre-visit preparation completed: Yes        Diabetes: No  How often do you need to have someone help you when you read instructions, pamphlets, or other written materials from your doctor or pharmacy?: 1 - Never  Interpreter Needed?: No     Past Medical History:  Diagnosis Date  . Abdominal pain   . Anemia   . Anxiety    adjustment disorder with anxiety  . Arthritis   . BPPV (benign paroxysmal positional vertigo)   . Cataract   . Chronic diarrhea   . Complication of anesthesia    . Coronary artery disease   . Dysphasia   . Gastric ulcer 1980's  . GERD (gastroesophageal reflux disease)   . High cholesterol   . Hypertension   . Osteoporosis   . Schatzki's ring 05/02/2013  . Scoliosis    Past Surgical History:  Procedure Laterality Date  . ABDOMINAL HYSTERECTOMY    . CARDIAC CATHETERIZATION    . CHOLECYSTECTOMY    . COLONOSCOPY    . COLONOSCOPY N/A 11/12/2014   Procedure: COLONOSCOPY;  Surgeon: Manya Silvas, MD;  Location: [facility] ENDOSCOPY;  Service: Endoscopy;  Laterality: N/A;  . DIAGNOSTIC LAPAROSCOPY     paraesphogeal hernia  . ESOPHAGOGASTRODUODENOSCOPY    . ESOPHAGOGASTRODUODENOSCOPY (EGD) WITH PROPOFOL N/A 04/16/2017   Procedure: ESOPHAGOGASTRODUODENOSCOPY (EGD) WITH PROPOFOL;  Surgeon: Manya Silvas, MD;  Location: [facility] ENDOSCOPY;  Service: Endoscopy;  Laterality: N/A;  . HERNIA REPAIR     inguinal hernia   Family History  Problem Relation Age of Onset  . Diabetes Mother   . Parkinson's disease Mother   . Cancer Father        Pancreatic  . Cancer Sister        Lung  . Diabetes Sister   . Alzheimer's disease Paternal Aunt        x2 sisters  . Diabetes Sister   . Alzheimer's disease Sister   . Tics Sister   . Breast cancer Neg Hx  Social History   Socioeconomic History  . Marital status: Single    Spouse name: Not on file  . Number of children: Not on file  . Years of education: Not on file  . Highest education level: Not on file  Occupational History  . Not on file  Social Needs  . Financial resource strain: Not hard at all  . Food insecurity    Worry: Never true    Inability: Never true  . Transportation needs    Medical: No    Non-medical: No  Tobacco Use  . Smoking status: Never Smoker  . Smokeless tobacco: Never Used  Substance and Sexual Activity  . Alcohol use: No    Alcohol/week: 0.0 standard drinks  . Drug use: No  . Sexual activity: Never  Lifestyle  . Physical activity    Days per week: Not on file     Minutes per session: Not on file  . Stress: Not at all  Relationships  . Social Herbalist on phone: Not on file    Gets together: Not on file    Attends religious service: Not on file    Active member of club or organization: Not on file    Attends meetings of clubs or organizations: Not on file    Relationship status: Not on file  Other Topics Concern  . Not on file  Social History Narrative   Divorced   Retired as a Scientist, research (medical)   12 + years of school     Outpatient Encounter Medications as of 01/15/2019  Medication Sig  . aspirin 81 MG tablet Take 81 mg by mouth daily.  . clobetasol ointment (TEMOVATE) 5.00 % Apply 1 application topically 2 (two) times daily as needed. For rash. Avoid face.  . Cyanocobalamin (VITAMIN B 12 PO) Take by mouth.  . fenofibrate 160 MG tablet Take 1 tablet by mouth once daily  . ferrous sulfate 325 (65 FE) MG tablet Take 325 mg by mouth 2 (two) times daily with a meal.  . ketoconazole (NIZORAL) 2 % cream Apply 1 application topically daily as needed.   . meclizine (ANTIVERT) 25 MG tablet Take 1 tablet (25 mg total) by mouth 2 (two) times daily as needed for dizziness.  . methocarbamol (ROBAXIN) 500 MG tablet   . metoprolol tartrate (LOPRESSOR) 100 MG tablet Take 1 tablet by mouth twice daily  . Misc Natural Products (OSTEO BI-FLEX/5-LOXIN ADVANCED) TABS Take 1 tablet by mouth 2 (two) times daily.  . Multiple Vitamins-Minerals (PRESERVISION AREDS 2 PO) Take by mouth.  . mupirocin ointment (BACTROBAN) 2 % Place 1 application into the nose 2 (two) times daily.  . naproxen sodium (ANAPROX) 220 MG tablet Take 220 mg by mouth daily. Sometimes pt takes twice a day if needed from pain  . Omega-3 Fatty Acids (OMEGA 3 500 PO) Take 1 capsule by mouth daily.   . pantoprazole (PROTONIX) 40 MG tablet Take 1 tablet by mouth once daily  . potassium chloride (K-DUR) 10 MEQ tablet Take 10 mEq by mouth daily.  Marland Kitchen PREMARIN vaginal cream INSERT ONE APPLICATORFUL  VAGINALLY TWICE WEEKLY  . Probiotic Product (PROBIOTIC DAILY) CAPS Take 1 capsule by mouth.  . simvastatin (ZOCOR) 40 MG tablet TAKE 1 TABLET BY MOUTH ONCE DAILY AT  6PM  . triamcinolone cream (KENALOG) 0.1 % APPLY TOPICALLY TWICE DAILY AS NEEDED FOR RASH. AVOID FACE.   No facility-administered encounter medications on file as of 01/15/2019.     Activities of  Daily Living In your present state of health, do you have any difficulty performing the following activities: 01/15/2019  Hearing? N  Vision? N  Difficulty concentrating or making decisions? N  Walking or climbing stairs? N  Dressing or bathing? N  Doing errands, shopping? N  Preparing Food and eating ? N  Using the Toilet? N  In the past six months, have you accidently leaked urine? N  Do you have problems with loss of bowel control? N  Managing your Medications? N  Managing your Finances? N  Housekeeping or managing your Housekeeping? N  Some recent data might be hidden    Patient Care Team: Leone Haven, MD as PCP - General (Family Medicine)    Assessment:   This is a routine wellness examination for [place].  I connected with patient 01/15/19 at 10:00 AM EDT by an audio enabled telemedicine application and verified that I am speaking with the correct person using two identifiers. Patient stated full name and DOB. Patient gave permission to continue with virtual visit. Patient's location was at home and Nurse's location was at [place] office.   Health Screenings  Mammogram - 04/2018 Colonoscopy - 11/2014 Bone Density - 10/2015 Glaucoma -none Hearing -demonstrates normal hearing during visit. Hemoglobin A1C - 09/2017 Labs followed by pcp Dental- visit every 4 months Vision- visits within the last 12 months.  Social  Alcohol intake - no          Smoking history- never    Smokers in home? none Illicit drug use? none Physical activity- walking around her home Diet - healthy diet Sexually Active -never BMI-  discussed the importance of a healthy diet, water intake and the benefits of aerobic exercise.  Educational material provided.   Safety  Patient feels safe at home- yes Patient does have smoke detectors at home- yes Patient does wear sunscreen or protective clothing when in direct sunlight -yes Patient does wear seat belt when in a moving vehicle -yes Patient drives- yes  [id number] precautions and sickness symptoms discussed.   Activities of Daily Living Patient denies needing assistance with: driving, household chores, feeding themselves, getting from bed to chair, getting to the toilet, bathing/showering, dressing, managing money, or preparing meals.  No new identified risk were noted.    Depression Screen Patient denies losing interest in daily life, feeling hopeless, or crying easily over simple problems.   Medication-taking as directed and without issues.   Fall Screen Patient denies being afraid of falling or falling in the last year.   Memory Screen Patient is alert.  Patient denies difficulty focusing, concentrating or misplacing items. Correctly identified the president of the [place], season and recall. Patient likes to paint for brain stimulation.  Immunizations The following Immunizations were discussed: Influenza, shingles, pneumonia, and tetanus.   Other Providers Patient Care Team: Leone Haven, MD as PCP - General (Family Medicine)  Exercise Activities and Dietary recommendations Current Exercise Habits: Home exercise routine, Type of exercise: walking, Intensity: Mild  Goals    . Increase physical activity     Walk for exercise Ride the exercise bike  150 minutes per week       Fall Risk Fall Risk  01/15/2019 02/12/2018 10/02/2017 09/29/2016 09/30/2015  Falls in the past year? 0 No No No No   Depression Screen PHQ 2/9 Scores 01/15/2019 02/12/2018 10/02/2017 09/29/2016  PHQ - 2 Score 0 0 0 0     Cognitive Function MMSE - Mini Mental State Exam 09/29/2016  09/30/2015  Orientation to time 5 5  Orientation to Place 5 5  Registration 3 3  Attention/ Calculation 5 5  Recall 3 3  Language- name 2 objects 2 2  Language- repeat 1 1  Language- follow 3 step command 3 3  Language- read & follow direction 1 1  Write a sentence 1 1  Copy design 1 1  Total score 30 30     6CIT Screen 01/15/2019 10/02/2017  What Year? 0 points 0 points  What month? 0 points 0 points  What time? 0 points 0 points  Count back from 20 0 points 0 points  Months in reverse 0 points 0 points  Repeat phrase 0 points 0 points  Total Score 0 0    Immunization History  Administered Date(s) Administered  . Influenza, High Dose Seasonal PF 03/31/2016, 04/06/2017, 03/27/2018, 03/29/2018  . Influenza-Unspecified 04/14/2015  . Pneumococcal Conjugate-13 09/30/2015  . Pneumococcal-Unspecified 07/17/2011  . Tdap 07/21/2015  . Zoster 08/17/2011   Screening Tests Health Maintenance  Topic Date Due  . INFLUENZA VACCINE  02/08/2019  . COLONOSCOPY  11/11/2024  . TETANUS/TDAP  07/20/2025  . DEXA SCAN  Completed  . Hepatitis C Screening  Completed  . PNA vac Low Risk Adult  Completed      Plan:    End of life planning; Advance aging; Advanced directives discussed.  Copy of current HCPOA/Living Will on file.    I have personally reviewed and noted the following in the patient's chart:   . Medical and social history . Use of alcohol, tobacco or illicit drugs  . Current medications and supplements . Functional ability and status . Nutritional status . Physical activity . Advanced directives . List of other physicians . Hospitalizations, surgeries, and ER visits in previous 12 months . Vitals . Screenings to include cognitive, depression, and falls . Referrals and appointments  In addition, I have reviewed and discussed with patient certain preventive protocols, quality metrics, and best practice recommendations. A written personalized care plan for preventive  services as well as general preventive health recommendations were provided to patient.     Varney Biles, LPN  08/16/9483

## 2019-01-15 NOTE — Progress Notes (Signed)
Virtual Visit via telephone Note  This visit type was conducted due to national recommendations for restrictions regarding the COVID-19 pandemic (e.g. social distancing).  This format is felt to be most appropriate for this patient at this time.  All issues noted in this document were discussed and addressed.  No physical exam was performed (except for noted visual exam findings with Video Visits).   I connected with today at 10:30 AM EDT by telephone and verified that I am speaking with the correct person using two identifiers. Location patient: home Location provider: home office Persons participating in the virtual visit: patient, provider  I discussed the limitations, risks, security and privacy concerns of performing an evaluation and management service by telephone and the availability of in person appointments. I also discussed with the patient that there may be a patient responsible charge related to this service. The patient expressed understanding and agreed to proceed.  Interactive audio and video telecommunications were attempted between this provider and patient, however failed, due to patient having technical difficulties OR patient did not have access to video capability.  We continued and completed visit with audio only.   Reason for visit: follow-up  HPI: HYPERLIPIDEMIA Symptoms Chest pain on exertion:  no Medications: Compliance- simvastatin, fenofibrate  Right upper quadrant pain- no  Muscle aches- no  HYPERTENSION  Disease Monitoring  Home BP Monitoring 135/75 Chest pain- no    Dyspnea- no Medications  Compliance-  Taking metoprolol.  Edema- no changes to chronic edema  Psoriasis: Patient notes this is adequately controlled with her clobetasol foam and clobetasol ointment.  She uses a foam in her scalp and the ointment over the rest of her skin as needed.  She did see dermatology last year and they recommended she use a less strong cream on her skin and if that did  not work she can use the clobetasol.  ANCA positive: She reports she did see rheumatology and they found no abnormalities.  Iron deficiency: She is no longer on an iron supplement.  She notes no bleeding from anywhere.  She has had an EGD and a colonoscopy.  She was seeing hematology for this though they discharged her from clinic to follow-up as needed.   ROS: See pertinent positives and negatives per HPI.  Past Medical History:  Diagnosis Date  . Abdominal pain   . Anemia   . Anxiety    adjustment disorder with anxiety  . Arthritis   . BPPV (benign paroxysmal positional vertigo)   . Cataract   . Chronic diarrhea   . Complication of anesthesia   . Coronary artery disease   . Dysphasia   . Gastric ulcer 1980's  . GERD (gastroesophageal reflux disease)   . High cholesterol   . Hypertension   . Osteoporosis   . Schatzki's ring 05/02/2013  . Scoliosis     Past Surgical History:  Procedure Laterality Date  . ABDOMINAL HYSTERECTOMY    . CARDIAC CATHETERIZATION    . CHOLECYSTECTOMY    . COLONOSCOPY    . COLONOSCOPY N/A 11/12/2014   Procedure: COLONOSCOPY;  Surgeon: Manya Silvas, MD;  Location: [facility] ENDOSCOPY;  Service: Endoscopy;  Laterality: N/A;  . DIAGNOSTIC LAPAROSCOPY     paraesphogeal hernia  . ESOPHAGOGASTRODUODENOSCOPY    . ESOPHAGOGASTRODUODENOSCOPY (EGD) WITH PROPOFOL N/A 04/16/2017   Procedure: ESOPHAGOGASTRODUODENOSCOPY (EGD) WITH PROPOFOL;  Surgeon: Manya Silvas, MD;  Location: [facility] ENDOSCOPY;  Service: Endoscopy;  Laterality: N/A;  . HERNIA REPAIR     inguinal  hernia    Family History  Problem Relation Age of Onset  . Diabetes Mother   . Parkinson's disease Mother   . Cancer Father        Pancreatic  . Cancer Sister        Lung  . Diabetes Sister   . Alzheimer's disease Paternal Aunt        x2 sisters  . Diabetes Sister   . Alzheimer's disease Sister   . Tics Sister   . Breast cancer Neg Hx     SOCIAL HX: Non-smoker   Current  Outpatient Medications:  .  aspirin 81 MG tablet, Take 81 mg by mouth daily., Disp: , Rfl:  .  clobetasol ointment (TEMOVATE) 8.41 %, Apply 1 application topically 2 (two) times daily as needed. For rash. Avoid face., Disp: 180 g, Rfl: 1 .  Cyanocobalamin (VITAMIN B 12 PO), Take by mouth., Disp: , Rfl:  .  fenofibrate 160 MG tablet, Take 1 tablet by mouth once daily, Disp: 90 tablet, Rfl: 0 .  ferrous sulfate 325 (65 FE) MG tablet, Take 325 mg by mouth 2 (two) times daily with a meal., Disp: , Rfl:  .  ketoconazole (NIZORAL) 2 % cream, Apply 1 application topically daily as needed. , Disp: , Rfl:  .  meclizine (ANTIVERT) 25 MG tablet, Take 1 tablet (25 mg total) by mouth 2 (two) times daily as needed for dizziness., Disp: 20 tablet, Rfl: 0 .  methocarbamol (ROBAXIN) 500 MG tablet, , Disp: , Rfl: 1 .  metoprolol tartrate (LOPRESSOR) 100 MG tablet, Take 1 tablet by mouth twice daily, Disp: 180 tablet, Rfl: 0 .  Misc Natural Products (OSTEO BI-FLEX/5-LOXIN ADVANCED) TABS, Take 1 tablet by mouth 2 (two) times daily., Disp: , Rfl:  .  Multiple Vitamins-Minerals (PRESERVISION AREDS 2 PO), Take by mouth., Disp: , Rfl:  .  mupirocin ointment (BACTROBAN) 2 %, Place 1 application into the nose 2 (two) times daily., Disp: 22 g, Rfl: 1 .  naproxen sodium (ANAPROX) 220 MG tablet, Take 220 mg by mouth daily. Sometimes pt takes twice a day if needed from pain, Disp: , Rfl:  .  Omega-3 Fatty Acids (OMEGA 3 500 PO), Take 1 capsule by mouth daily. , Disp: , Rfl:  .  pantoprazole (PROTONIX) 40 MG tablet, Take 1 tablet by mouth once daily, Disp: 90 tablet, Rfl: 0 .  potassium chloride (K-DUR) 10 MEQ tablet, Take 10 mEq by mouth daily., Disp: , Rfl:  .  PREMARIN vaginal cream, INSERT ONE APPLICATORFUL VAGINALLY TWICE WEEKLY, Disp: 60 g, Rfl: 0 .  Probiotic Product (PROBIOTIC DAILY) CAPS, Take 1 capsule by mouth., Disp: , Rfl:  .  simvastatin (ZOCOR) 40 MG tablet, TAKE 1 TABLET BY MOUTH ONCE DAILY AT  6PM, Disp: 90  tablet, Rfl: 0 .  triamcinolone cream (KENALOG) 0.1 %, APPLY TOPICALLY TWICE DAILY AS NEEDED FOR RASH. AVOID FACE., Disp: 240 g, Rfl: 0 .  clobetasol (OLUX) 0.05 % topical foam, Apply topically 2 (two) times daily., Disp: 50 g, Rfl: 0  EXAM: This was a telehealth telephone visit and thus no physical exam was completed.  ASSESSMENT AND PLAN:  Discussed the following assessment and plan:  Benign essential HTN Adequately controlled.  Continue current medication.  She will come in for lab work.  Psoriasis Stable.  Refills of clobetasol ointment and foam provided.  Antineutrophil cytoplasmic antibody (ANCA) positive Patient has been evaluated by rheumatology.  Hyperlipidemia Continue current medications.  Check labs.  Iron deficiency No  longer on iron supplement.  Will recheck labs.  Social distancing precautions and sick precautions given regarding COVID-19.   I discussed the assessment and treatment plan with the patient. The patient was provided an opportunity to ask questions and all were answered. The patient agreed with the plan and demonstrated an understanding of the instructions.   The patient was advised to call back or seek an in-person evaluation if the symptoms worsen or if the condition fails to improve as anticipated.  I provided 15 minutes of non-face-to-face time during this encounter.   Tommi Rumps, MD

## 2019-01-15 NOTE — Assessment & Plan Note (Signed)
Adequately controlled.  Continue current medication.  She will come in for lab work.

## 2019-01-15 NOTE — Progress Notes (Signed)
I have reviewed the above note and agree.  Oliva Montecalvo, M.D.  

## 2019-01-29 ENCOUNTER — Other Ambulatory Visit: Payer: Self-pay | Admitting: Family Medicine

## 2019-01-29 ENCOUNTER — Other Ambulatory Visit: Payer: Self-pay

## 2019-01-29 ENCOUNTER — Encounter (INDEPENDENT_AMBULATORY_CARE_PROVIDER_SITE_OTHER): Payer: Self-pay | Admitting: Vascular Surgery

## 2019-01-29 ENCOUNTER — Ambulatory Visit (INDEPENDENT_AMBULATORY_CARE_PROVIDER_SITE_OTHER): Payer: PPO | Admitting: Vascular Surgery

## 2019-01-29 VITALS — BP 152/75 | HR 57 | Resp 16 | Wt 129.4 lb

## 2019-01-29 DIAGNOSIS — I83813 Varicose veins of bilateral lower extremities with pain: Secondary | ICD-10-CM

## 2019-01-29 NOTE — Progress Notes (Signed)
.  Varicose veins of left lower extremity with inflammation (454.1  I83.10) Current Plans   Indication: Patient presents with symptomatic varicose veins of the left lower extremity.   Procedure: Sclerotherapy using hypertonic saline mixed with 1% Lidocaine was performed on the left lower extremity. Compression wraps were placed. The patient tolerated the procedure well. 

## 2019-01-30 DIAGNOSIS — L308 Other specified dermatitis: Secondary | ICD-10-CM | POA: Diagnosis not present

## 2019-01-30 DIAGNOSIS — D692 Other nonthrombocytopenic purpura: Secondary | ICD-10-CM | POA: Diagnosis not present

## 2019-01-30 DIAGNOSIS — I8312 Varicose veins of left lower extremity with inflammation: Secondary | ICD-10-CM | POA: Diagnosis not present

## 2019-01-30 DIAGNOSIS — I8393 Asymptomatic varicose veins of bilateral lower extremities: Secondary | ICD-10-CM | POA: Diagnosis not present

## 2019-01-30 DIAGNOSIS — L400 Psoriasis vulgaris: Secondary | ICD-10-CM | POA: Diagnosis not present

## 2019-01-31 ENCOUNTER — Other Ambulatory Visit: Payer: Self-pay | Admitting: Family Medicine

## 2019-02-07 ENCOUNTER — Other Ambulatory Visit (INDEPENDENT_AMBULATORY_CARE_PROVIDER_SITE_OTHER): Payer: PPO

## 2019-02-07 ENCOUNTER — Other Ambulatory Visit: Payer: Self-pay

## 2019-02-07 DIAGNOSIS — E611 Iron deficiency: Secondary | ICD-10-CM | POA: Diagnosis not present

## 2019-02-07 DIAGNOSIS — E785 Hyperlipidemia, unspecified: Secondary | ICD-10-CM | POA: Diagnosis not present

## 2019-02-07 LAB — CBC
HCT: 38.5 % (ref 36.0–46.0)
Hemoglobin: 12.7 g/dL (ref 12.0–15.0)
MCHC: 32.9 g/dL (ref 30.0–36.0)
MCV: 88.2 fl (ref 78.0–100.0)
Platelets: 353 10*3/uL (ref 150.0–400.0)
RBC: 4.37 Mil/uL (ref 3.87–5.11)
RDW: 14 % (ref 11.5–15.5)
WBC: 5.1 10*3/uL (ref 4.0–10.5)

## 2019-02-07 LAB — IBC + FERRITIN
Ferritin: 106.7 ng/mL (ref 10.0–291.0)
Iron: 145 ug/dL (ref 42–145)
Saturation Ratios: 32 % (ref 20.0–50.0)
Transferrin: 324 mg/dL (ref 212.0–360.0)

## 2019-02-07 LAB — COMPREHENSIVE METABOLIC PANEL
ALT: 11 U/L (ref 0–35)
AST: 21 U/L (ref 0–37)
Albumin: 4.1 g/dL (ref 3.5–5.2)
Alkaline Phosphatase: 41 U/L (ref 39–117)
BUN: 14 mg/dL (ref 6–23)
CO2: 27 mEq/L (ref 19–32)
Calcium: 9.6 mg/dL (ref 8.4–10.5)
Chloride: 99 mEq/L (ref 96–112)
Creatinine, Ser: 0.82 mg/dL (ref 0.40–1.20)
GFR: 67.87 mL/min (ref >60.00)
Glucose, Bld: 96 mg/dL (ref 70–99)
Potassium: 4.6 mEq/L (ref 3.5–5.1)
Sodium: 133 mEq/L — ABNORMAL LOW (ref 135–145)
Total Bilirubin: 0.7 mg/dL (ref 0.2–1.2)
Total Protein: 6.5 g/dL (ref 6.0–8.3)

## 2019-02-07 LAB — LIPID PANEL
Cholesterol: 104 mg/dL (ref 0–200)
HDL: 41.7 mg/dL (ref >39.00)
LDL Cholesterol: 45 mg/dL (ref 0–99)
NonHDL: 62.15
Total CHOL/HDL Ratio: 2
Triglycerides: 87 mg/dL (ref 0.0–149.0)
VLDL: 17.4 mg/dL (ref 0.0–40.0)

## 2019-02-09 ENCOUNTER — Other Ambulatory Visit: Payer: Self-pay | Admitting: Family Medicine

## 2019-02-09 DIAGNOSIS — E871 Hypo-osmolality and hyponatremia: Secondary | ICD-10-CM

## 2019-02-17 ENCOUNTER — Other Ambulatory Visit (INDEPENDENT_AMBULATORY_CARE_PROVIDER_SITE_OTHER): Payer: PPO

## 2019-02-17 ENCOUNTER — Other Ambulatory Visit: Payer: Self-pay

## 2019-02-17 DIAGNOSIS — E871 Hypo-osmolality and hyponatremia: Secondary | ICD-10-CM

## 2019-02-17 LAB — BASIC METABOLIC PANEL
BUN: 16 mg/dL (ref 6–23)
CO2: 29 mEq/L (ref 19–32)
Calcium: 9.9 mg/dL (ref 8.4–10.5)
Chloride: 99 mEq/L (ref 96–112)
Creatinine, Ser: 0.77 mg/dL (ref 0.40–1.20)
GFR: 72.98 mL/min (ref >60.00)
Glucose, Bld: 92 mg/dL (ref 70–99)
Potassium: 4.2 mEq/L (ref 3.5–5.1)
Sodium: 134 mEq/L — ABNORMAL LOW (ref 135–145)

## 2019-02-19 ENCOUNTER — Other Ambulatory Visit: Payer: Self-pay

## 2019-02-19 ENCOUNTER — Ambulatory Visit (INDEPENDENT_AMBULATORY_CARE_PROVIDER_SITE_OTHER): Payer: PPO | Admitting: Vascular Surgery

## 2019-02-19 ENCOUNTER — Encounter (INDEPENDENT_AMBULATORY_CARE_PROVIDER_SITE_OTHER): Payer: Self-pay | Admitting: Vascular Surgery

## 2019-02-19 VITALS — BP 110/70 | HR 60 | Resp 12 | Ht 60.0 in | Wt 129.0 lb

## 2019-02-19 DIAGNOSIS — I83812 Varicose veins of left lower extremities with pain: Secondary | ICD-10-CM | POA: Diagnosis not present

## 2019-02-19 DIAGNOSIS — I83813 Varicose veins of bilateral lower extremities with pain: Secondary | ICD-10-CM

## 2019-02-19 NOTE — Progress Notes (Signed)
Varicose veins of left lower extremity with inflammation (454.1  I83.10) Current Plans   Indication: Patient presents with symptomatic varicose veins of the left lower extremity.   Procedure: Sclerotherapy using hypertonic saline mixed with 1% Lidocaine was performed on the left lower extremity. Compression wraps were placed. The patient tolerated the procedure well. 

## 2019-03-17 IMAGING — US US EXTREM LOW VENOUS*L*
1 series · 13 of 24 positions shown · non-contrast
Comparison: None.

CLINICAL DATA: Left lower extremity pain and edema for the past
week. Evaluate for DVT.



[Series 1: us extrem low venous*left* · 13 of 54 slices shown]
[im 1/54]
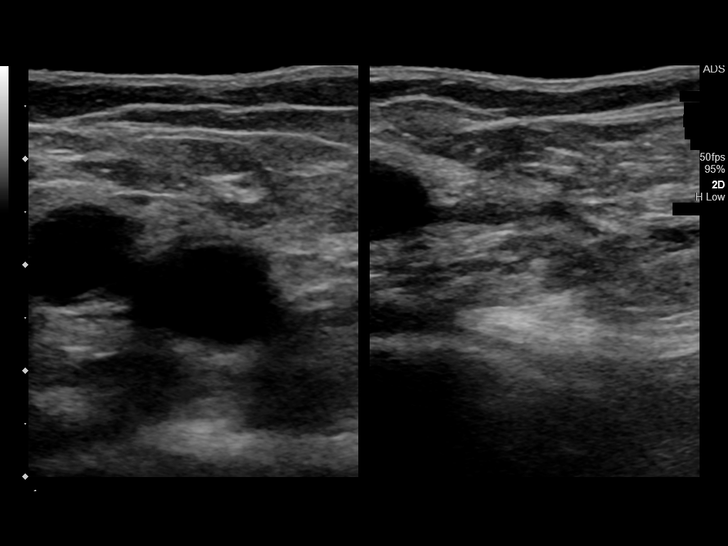
[im 5/54]
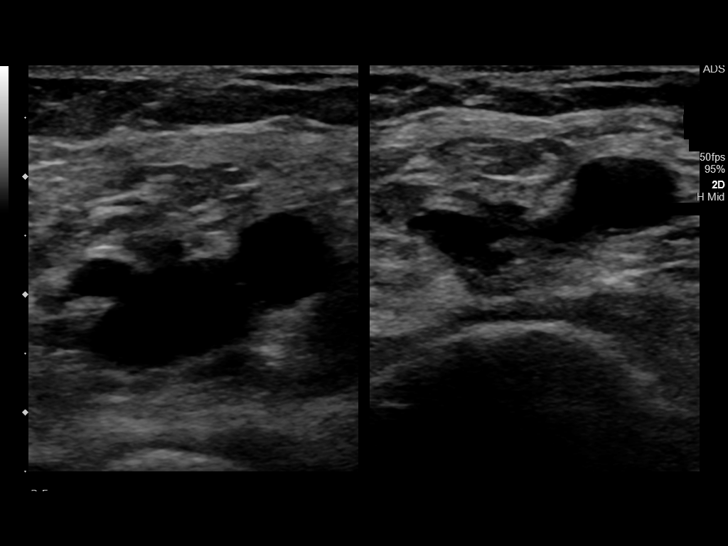
[im 10/54]
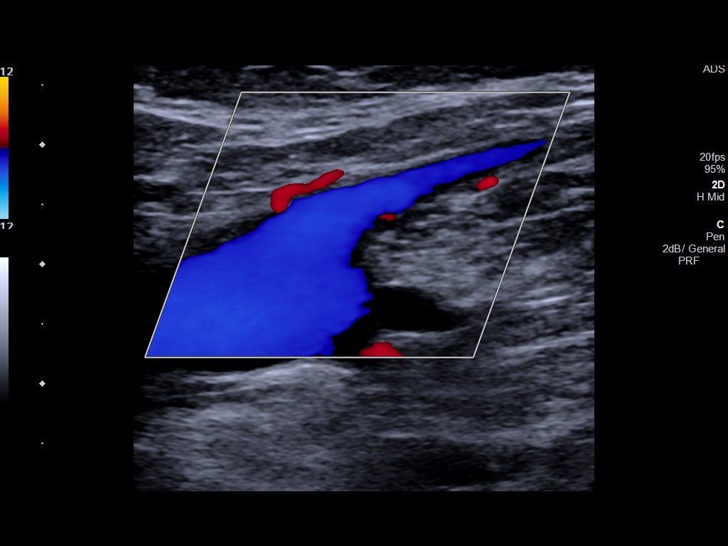
[im 14/54]
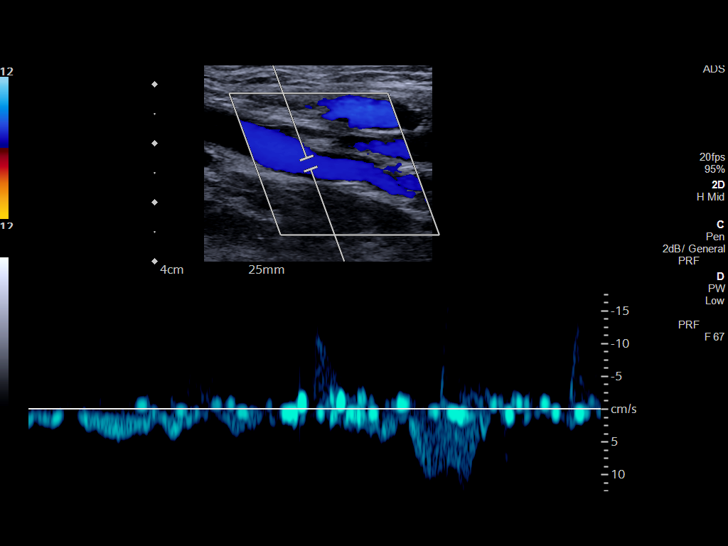
[im 19/54]
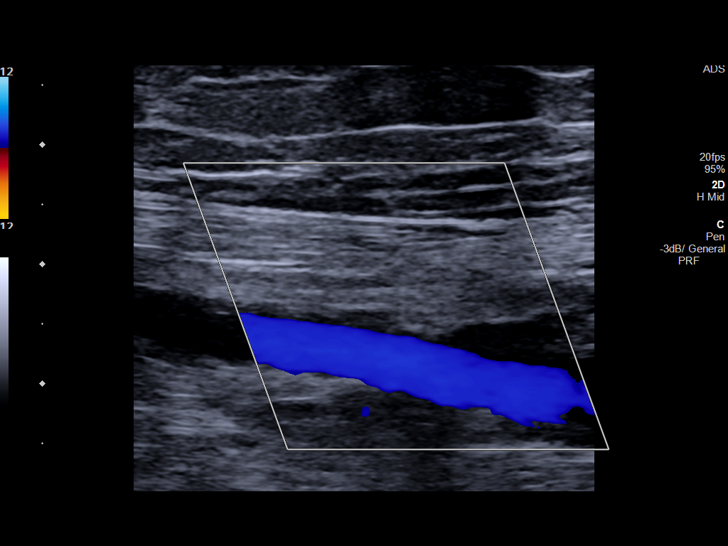
[im 24/54]
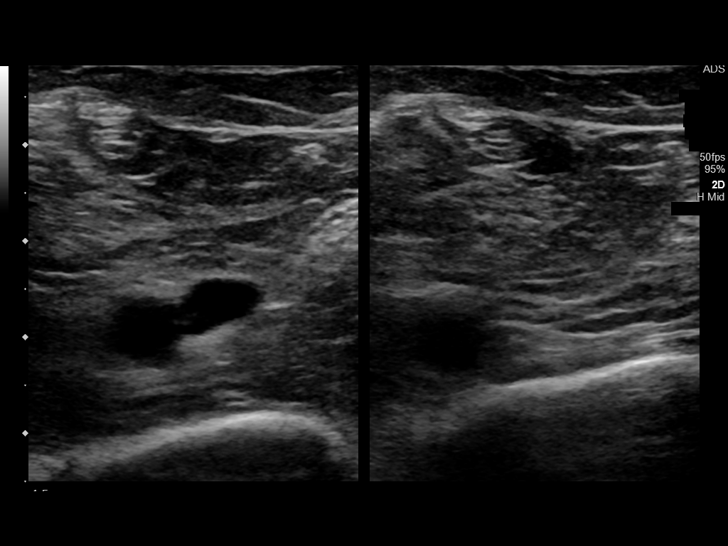
[im 28/54]
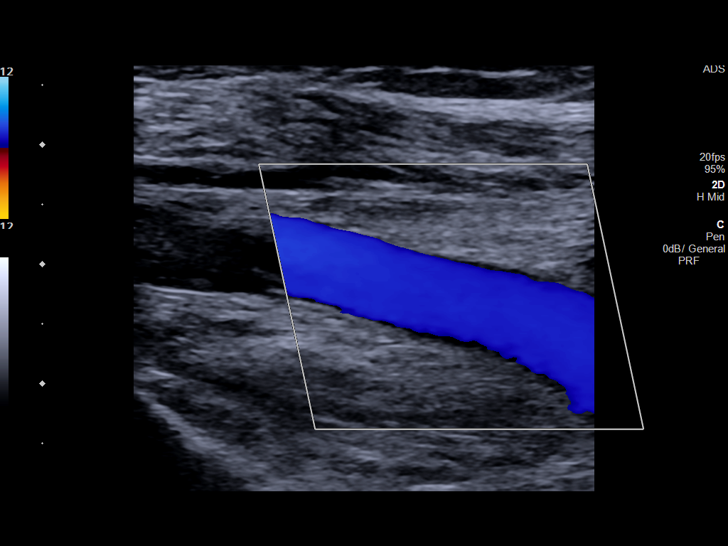
[im 30/54]
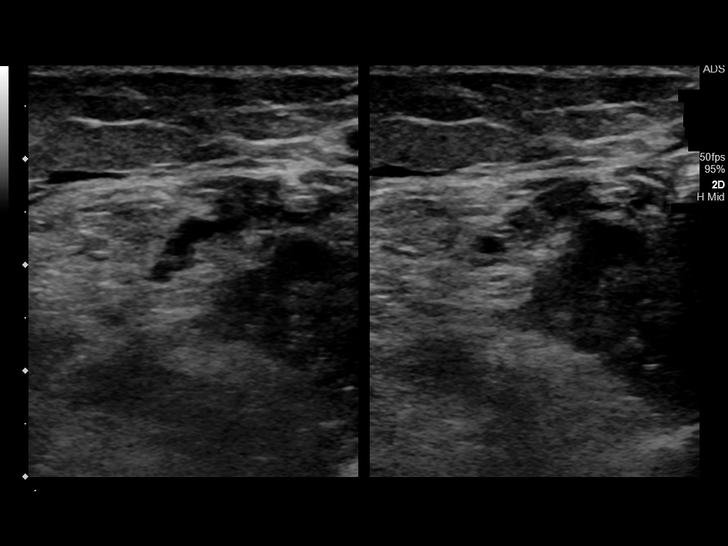
[im 35/54]
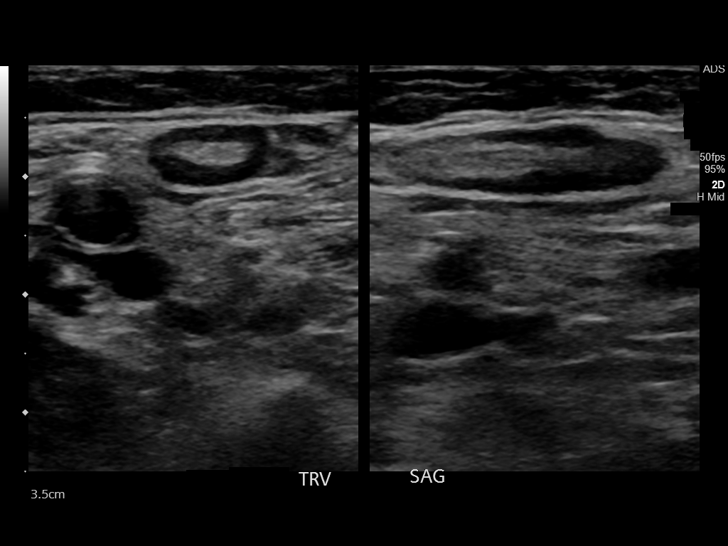
[im 40/54]
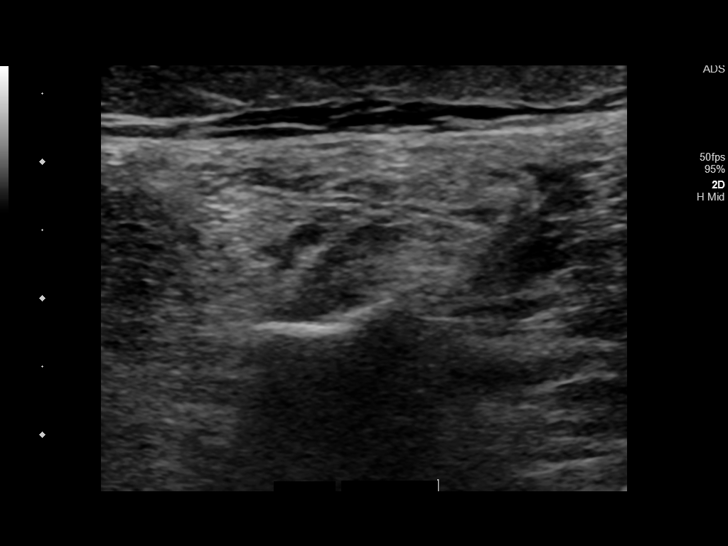
[im 44/54]
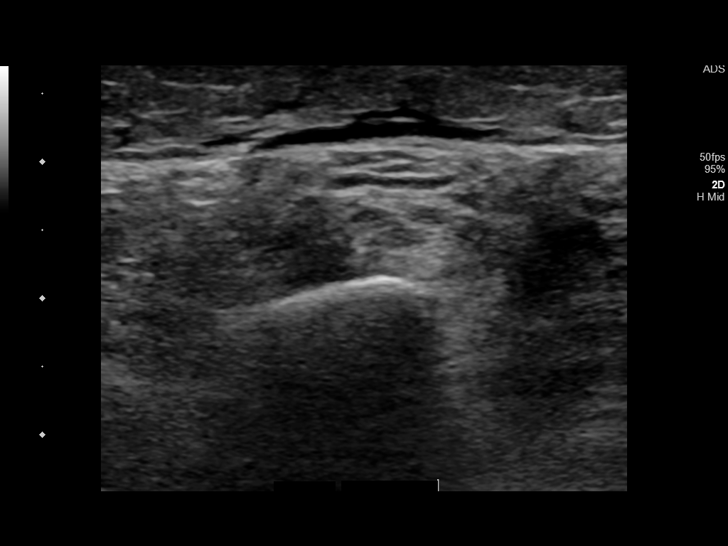
[im 49/54]
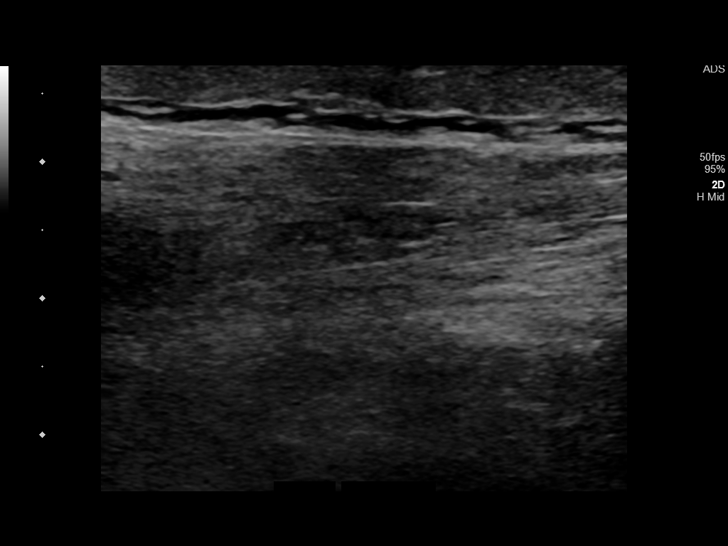
[im 54/54]
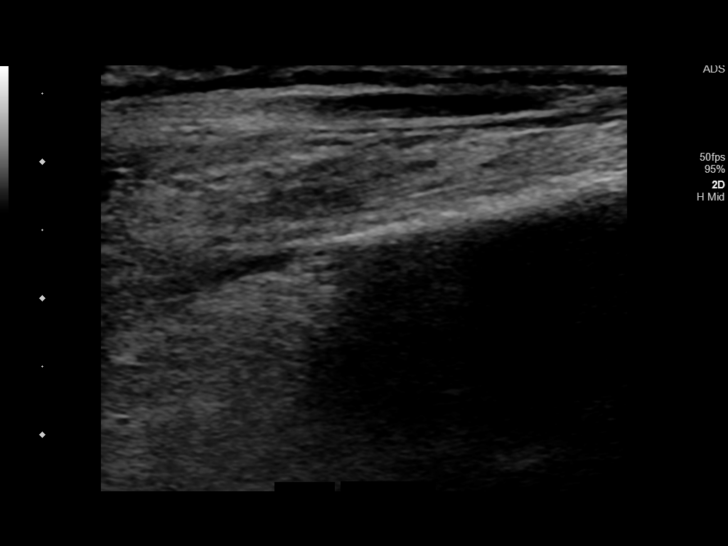

[13 of 24 positions shown; findings below may reference images not displayed]

FINDINGS: Contralateral Common Femoral Vein: Respiratory phasicity is normal
and symmetric with the symptomatic side. No evidence of thrombus.
Normal compressibility.

Common Femoral Vein: No evidence of thrombus. Normal
compressibility, respiratory phasicity and response to augmentation.

Saphenofemoral Junction: No evidence of thrombus. Normal
compressibility and flow on color Doppler imaging.

Profunda Femoral Vein: No evidence of thrombus. Normal
compressibility and flow on color Doppler imaging.

Femoral Vein: No evidence of thrombus. Normal compressibility,
respiratory phasicity and response to augmentation.

Popliteal Vein: No evidence of thrombus. Normal compressibility,
respiratory phasicity and response to augmentation.

Calf Veins: No evidence of thrombus. Normal compressibility and flow
on color Doppler imaging.

Superficial Great Saphenous Vein: No evidence of thrombus. Normal
compressibility.

Venous Reflux:  None.

Other Findings: Note is made of a prominent though benign appearing
left inguinal lymph node which is not enlarged by size criteria
measuring 0.6 cm in greatest short axis diameter and maintains a
benign fatty hilum. A minimal amount of subcutaneous edema
correlates with the patient's area discomfort involving lateral
aspect lower leg and ankle. No definable/drainable fluid collection.
IMPRESSION: 1. No evidence of DVT within the left lower extremity.
2. There is minimal amount of subcutaneous edema which correlates
with the patient's area discomfort involving the lateral aspect
lower leg and ankle. No definable/drainable fluid collection.

## 2019-03-24 ENCOUNTER — Other Ambulatory Visit: Payer: Self-pay | Admitting: Family Medicine

## 2019-03-27 ENCOUNTER — Other Ambulatory Visit: Payer: Self-pay | Admitting: Family Medicine

## 2019-03-31 ENCOUNTER — Ambulatory Visit (INDEPENDENT_AMBULATORY_CARE_PROVIDER_SITE_OTHER): Payer: PPO | Admitting: *Deleted

## 2019-03-31 ENCOUNTER — Other Ambulatory Visit: Payer: Self-pay | Admitting: Family Medicine

## 2019-03-31 ENCOUNTER — Other Ambulatory Visit: Payer: Self-pay

## 2019-03-31 DIAGNOSIS — L400 Psoriasis vulgaris: Secondary | ICD-10-CM | POA: Diagnosis not present

## 2019-03-31 DIAGNOSIS — S51002A Unspecified open wound of left elbow, initial encounter: Secondary | ICD-10-CM | POA: Diagnosis not present

## 2019-03-31 DIAGNOSIS — L209 Atopic dermatitis, unspecified: Secondary | ICD-10-CM | POA: Diagnosis not present

## 2019-03-31 DIAGNOSIS — Z23 Encounter for immunization: Secondary | ICD-10-CM | POA: Diagnosis not present

## 2019-03-31 DIAGNOSIS — I8312 Varicose veins of left lower extremity with inflammation: Secondary | ICD-10-CM | POA: Diagnosis not present

## 2019-03-31 MED ORDER — PREMARIN 0.625 MG/GM VA CREA: TOPICAL_CREAM | VAGINAL | 2 refills | Status: DC

## 2019-04-25 ENCOUNTER — Other Ambulatory Visit: Payer: Self-pay | Admitting: Family Medicine

## 2019-04-25 DIAGNOSIS — Z1231 Encounter for screening mammogram for malignant neoplasm of breast: Secondary | ICD-10-CM

## 2019-04-28 ENCOUNTER — Ambulatory Visit
Admission: RE | Admit: 2019-04-28 | Discharge: 2019-04-28 | Disposition: A | Discharge status: Home / Self Care | Payer: PPO | Source: Ambulatory Visit | Attending: Family Medicine | Admitting: Family Medicine

## 2019-04-28 DIAGNOSIS — Z1231 Encounter for screening mammogram for malignant neoplasm of breast: Secondary | ICD-10-CM | POA: Diagnosis not present

## 2019-05-05 ENCOUNTER — Other Ambulatory Visit: Payer: Self-pay | Admitting: Family Medicine

## 2019-05-06 ENCOUNTER — Other Ambulatory Visit: Payer: Self-pay | Admitting: Family Medicine

## 2019-05-06 MED ORDER — SIMVASTATIN 40 MG PO TABS: ORAL_TABLET | ORAL | 0 refills | Status: DC

## 2019-05-06 MED ORDER — PANTOPRAZOLE SODIUM 40 MG PO TBEC: 40.0000 mg | DELAYED_RELEASE_TABLET | Freq: Every day | ORAL | 0 refills | Status: DC

## 2019-05-06 MED ORDER — FENOFIBRATE 160 MG PO TABS: 160.0000 mg | ORAL_TABLET | Freq: Every day | ORAL | 0 refills | Status: DC

## 2019-05-06 MED ORDER — METOPROLOL TARTRATE 100 MG PO TABS: 100.0000 mg | ORAL_TABLET | Freq: Two times a day (BID) | ORAL | 0 refills | Status: DC

## 2019-05-06 NOTE — Telephone Encounter (Signed)
Copied from [place] [id number]. Topic: Quick Communication - Rx Refill/Question >> May 06, 2019  2:07 PM Leward Quan A wrote: Medication: fenofibrate 160 MG tablet, metoprolol tartrate (LOPRESSOR) 100 MG tablet, pantoprazole (PROTONIX) 40 MG tablet, simvastatin (ZOCOR) 40 MG tablet   Per patient she requested refill last week only have one pill left and need a refill today   Has the patient contacted their pharmacy? Yes.   (Agent: If no, request that the patient contact the pharmacy for the refill.) (Agent: If yes, when and what did the pharmacy advise?)  Preferred Pharmacy (with phone number or street name): [facility] [address], [place] - [place] [phone] (Phone) [phone] (Fax)    Agent: Please be advised that RX refills may take up to 3 business days. We ask that you follow-up with your pharmacy.

## 2019-05-30 ENCOUNTER — Other Ambulatory Visit: Payer: Self-pay

## 2019-07-14 ENCOUNTER — Other Ambulatory Visit: Payer: Self-pay | Admitting: Family Medicine

## 2019-07-18 ENCOUNTER — Other Ambulatory Visit: Payer: Self-pay

## 2019-07-18 ENCOUNTER — Encounter: Payer: Self-pay | Admitting: Family Medicine

## 2019-07-18 ENCOUNTER — Ambulatory Visit (INDEPENDENT_AMBULATORY_CARE_PROVIDER_SITE_OTHER): Payer: PPO | Admitting: Family Medicine

## 2019-07-18 DIAGNOSIS — N952 Postmenopausal atrophic vaginitis: Secondary | ICD-10-CM | POA: Diagnosis not present

## 2019-07-18 DIAGNOSIS — I10 Essential (primary) hypertension: Secondary | ICD-10-CM

## 2019-07-18 DIAGNOSIS — K219 Gastro-esophageal reflux disease without esophagitis: Secondary | ICD-10-CM

## 2019-07-18 MED ORDER — PREMARIN 0.625 MG/GM VA CREA: TOPICAL_CREAM | VAGINAL | 2 refills | Status: DC

## 2019-07-18 NOTE — Progress Notes (Signed)
Virtual Visit via video Note  This visit type was conducted due to national recommendations for restrictions regarding the COVID-19 pandemic (e.g. social distancing).  This format is felt to be most appropriate for this patient at this time.  All issues noted in this document were discussed and addressed.  No physical exam was performed (except for noted visual exam findings with Video Visits).   I connected with Lindsay Tran today at 10:30 AM EST by a video enabled telemedicine application or telephone and verified that I am speaking with the correct person using two identifiers. Location patient: home Location provider: work Persons participating in the virtual visit: patient, provider  I discussed the limitations, risks, security and privacy concerns of performing an evaluation and management service by telephone and the availability of in person appointments. I also discussed with the patient that there may be a patient responsible charge related to this service. The patient expressed understanding and agreed to proceed.  Reason for visit: follow-up  HPI: HYPERTENSION  Disease Monitoring  Home BP Monitoring 121/78 Chest pain- no    Dyspnea- no Medications  Compliance-  Taking metoprolol.  Edema- no  GERD: Taking Protonix.  She has no reflux symptoms if she takes this daily.  No abdominal pain.  No blood in her stool.  She has chronic mild dysphagia that is stable over the last 2 years.  She had an EGD previously and had this dilated though she notes that the GI physician advised her that he would not recommend any more dilations for her.  Atrophic vaginitis: Patient notes Premarin is quite beneficial.  She does have some cost concerns with this and would like a 90-day supply.  She has run out of it and does have some irritation and feels raw which does typically occur when she is not using the medication.     ROS: See pertinent positives and negatives per HPI.  Past Medical  History:  Diagnosis Date  . Abdominal pain   . Anemia   . Anxiety    adjustment disorder with anxiety  . Arthritis   . BPPV (benign paroxysmal positional vertigo)   . Cataract   . Chronic diarrhea   . Complication of anesthesia   . Coronary artery disease   . Dysphasia   . Gastric ulcer 1980's  . GERD (gastroesophageal reflux disease)   . High cholesterol   . Hypertension   . Osteoporosis   . Schatzki's ring 05/02/2013  . Scoliosis     Past Surgical History:  Procedure Laterality Date  . ABDOMINAL HYSTERECTOMY    . CARDIAC CATHETERIZATION    . CHOLECYSTECTOMY    . COLONOSCOPY    . COLONOSCOPY N/A 11/12/2014   Procedure: COLONOSCOPY;  Surgeon: Manya Silvas, MD;  Location[facility] ENDOSCOPY;  Service: Endoscopy;  Laterality: N/A;  . DIAGNOSTIC LAPAROSCOPY     paraesphogeal hernia  . ESOPHAGOGASTRODUODENOSCOPY    . ESOPHAGOGASTRODUODENOSCOPY (EGD) WITH PROPOFOL N/A 04/16/2017   Procedure: ESOPHAGOGASTRODUODENOSCOPY (EGD) WITH PROPOFOL;  Surgeon: Manya Silvas, MD;  Location: [facility] ENDOSCOPY;  Service: Endoscopy;  Laterality: N/A;  . HERNIA REPAIR     inguinal hernia    Family History  Problem Relation Age of Onset  . Diabetes Mother   . Parkinson's disease Mother   . Cancer Father        Pancreatic  . Cancer Sister        Lung  . Diabetes Sister   . Alzheimer's disease Paternal Aunt  x2 sisters  . Diabetes Sister   . Alzheimer's disease Sister   . Tics Sister   . Breast cancer Neg Hx     SOCIAL HX: Non-smoker   Current Outpatient Medications:  .  aspirin 81 MG tablet, Take 81 mg by mouth daily., Disp: , Rfl:  .  clobetasol (OLUX) 0.05 % topical foam, Apply topically 2 (two) times daily., Disp: 50 g, Rfl: 0 .  clobetasol ointment (TEMOVATE) [id number] %, Apply 1 application topically 2 (two) times daily as needed. For rash. Avoid face., Disp: 180 g, Rfl: 1 .  conjugated estrogens (PREMARIN) vaginal cream, INSERT ONE APPLICATORFUL VAGINALLY TWICE WEEKLY,  Disp: 90 g, Rfl: 2 .  Cyanocobalamin (VITAMIN B 12 PO), Take by mouth., Disp: , Rfl:  .  fenofibrate 160 MG tablet, Take 1 tablet by mouth once daily, Disp: 90 tablet, Rfl: 0 .  ketoconazole (NIZORAL) 2 % cream, Apply 1 application topically daily as needed. , Disp: , Rfl:  .  meclizine (ANTIVERT) 25 MG tablet, Take 1 tablet (25 mg total) by mouth 2 (two) times daily as needed for dizziness., Disp: 20 tablet, Rfl: 0 .  methocarbamol (ROBAXIN) 500 MG tablet, , Disp: , Rfl: 1 .  metoprolol tartrate (LOPRESSOR) 100 MG tablet, Take 1 tablet by mouth twice daily, Disp: 180 tablet, Rfl: 0 .  Misc Natural Products (OSTEO BI-FLEX/5-LOXIN ADVANCED) TABS, Take 1 tablet by mouth 2 (two) times daily., Disp: , Rfl:  .  Multiple Vitamins-Minerals (PRESERVISION AREDS 2 PO), Take by mouth., Disp: , Rfl:  .  mupirocin ointment (BACTROBAN) 2 %, Place 1 application into the nose 2 (two) times daily., Disp: 22 g, Rfl: 1 .  naproxen sodium (ANAPROX) 220 MG tablet, Take 220 mg by mouth daily. Sometimes pt takes twice a day if needed from pain, Disp: , Rfl:  .  Omega-3 Fatty Acids (OMEGA 3 500 PO), Take 1 capsule by mouth daily. , Disp: , Rfl:  .  pantoprazole (PROTONIX) 40 MG tablet, Take 1 tablet by mouth once daily, Disp: 90 tablet, Rfl: 0 .  potassium chloride (K-DUR) 10 MEQ tablet, Take 10 mEq by mouth daily., Disp: , Rfl:  .  Probiotic Product (PROBIOTIC DAILY) CAPS, Take 1 capsule by mouth., Disp: , Rfl:  .  simvastatin (ZOCOR) 40 MG tablet, TAKE 1 TABLET BY MOUTH ONCE DAILY AT  6PM, Disp: 90 tablet, Rfl: 0 .  triamcinolone cream (KENALOG) 0.1 %, APPLY TOPICALLY TWICE DAILY AS NEEDED FOR RASH. AVOID FACE., Disp: 240 g, Rfl: 0  EXAM:  VITALS per patient if applicable:  GENERAL: alert, oriented, appears well and in no acute distress  HEENT: atraumatic, conjunttiva clear, no obvious abnormalities on inspection of external nose and ears  NECK: normal movements of the head and neck  LUNGS: on inspection no  signs of respiratory distress, breathing rate appears normal, no obvious gross SOB, gasping or wheezing  CV: no obvious cyanosis  MS: moves all visible extremities without noticeable abnormality  PSYCH/NEURO: pleasant and cooperative, no obvious depression or anxiety, speech and thought processing grossly intact  ASSESSMENT AND PLAN:  Discussed the following assessment and plan:  Atrophic vaginitis Refill of Premarin.  She will let us know if it is excessively expensive.  Benign essential HTN Adequately controlled.  Continue current regimen.  Acid reflux Stable.  Asymptomatic on medication.  Discussed monitoring her swallowing issues and if they change at all letting us know so we can get her evaluated.   No orders of the defined  types were placed in this encounter.   Meds ordered this encounter  Medications  . conjugated estrogens (PREMARIN) vaginal cream    Sig: INSERT ONE APPLICATORFUL VAGINALLY TWICE WEEKLY    Dispense:  90 g    Refill:  2    90 day supply     I discussed the assessment and treatment plan with the patient. The patient was provided an opportunity to ask questions and all were answered. The patient agreed with the plan and demonstrated an understanding of the instructions.   The patient was advised to call back or seek an in-person evaluation if the symptoms worsen or if the condition fails to improve as anticipated.   Tommi Rumps, MD

## 2019-07-18 NOTE — Assessment & Plan Note (Signed)
Refill of Premarin.  She will let us know if it is excessively expensive.

## 2019-07-18 NOTE — Assessment & Plan Note (Signed)
Stable.  Asymptomatic on medication.  Discussed monitoring her swallowing issues and if they change at all letting us know so we can get her evaluated.

## 2019-07-18 NOTE — Assessment & Plan Note (Signed)
Adequately controlled.  Continue current regimen. 

## 2019-08-04 ENCOUNTER — Ambulatory Visit: Payer: PPO

## 2019-09-16 ENCOUNTER — Other Ambulatory Visit: Payer: Self-pay | Admitting: Family Medicine

## 2019-09-16 DIAGNOSIS — L409 Psoriasis, unspecified: Secondary | ICD-10-CM

## 2019-10-27 ENCOUNTER — Other Ambulatory Visit: Payer: Self-pay | Admitting: Family Medicine

## 2019-12-17 ENCOUNTER — Encounter: Payer: Self-pay | Admitting: Family Medicine

## 2019-12-17 ENCOUNTER — Ambulatory Visit (INDEPENDENT_AMBULATORY_CARE_PROVIDER_SITE_OTHER): Payer: PPO | Admitting: Family Medicine

## 2019-12-17 ENCOUNTER — Other Ambulatory Visit: Payer: Self-pay | Admitting: Family Medicine

## 2019-12-17 ENCOUNTER — Other Ambulatory Visit: Payer: Self-pay

## 2019-12-17 VITALS — BP 140/80 | HR 67 | Temp 96.6°F | Ht 60.0 in | Wt 134.8 lb

## 2019-12-17 DIAGNOSIS — E785 Hyperlipidemia, unspecified: Secondary | ICD-10-CM

## 2019-12-17 DIAGNOSIS — R09A2 Foreign body sensation, throat: Secondary | ICD-10-CM | POA: Insufficient documentation

## 2019-12-17 DIAGNOSIS — E611 Iron deficiency: Secondary | ICD-10-CM

## 2019-12-17 DIAGNOSIS — D72819 Decreased white blood cell count, unspecified: Secondary | ICD-10-CM

## 2019-12-17 DIAGNOSIS — K219 Gastro-esophageal reflux disease without esophagitis: Secondary | ICD-10-CM

## 2019-12-17 DIAGNOSIS — I10 Essential (primary) hypertension: Secondary | ICD-10-CM

## 2019-12-17 DIAGNOSIS — L608 Other nail disorders: Secondary | ICD-10-CM | POA: Diagnosis not present

## 2019-12-17 DIAGNOSIS — R0989 Other specified symptoms and signs involving the circulatory and respiratory systems: Secondary | ICD-10-CM

## 2019-12-17 LAB — LIPID PANEL
Cholesterol: 93 mg/dL (ref 0–200)
HDL: 35.3 mg/dL — ABNORMAL LOW (ref >39.00)
LDL Cholesterol: 44 mg/dL (ref 0–99)
NonHDL: 58.12
Total CHOL/HDL Ratio: 3
Triglycerides: 72 mg/dL (ref 0.0–149.0)
VLDL: 14.4 mg/dL (ref 0.0–40.0)

## 2019-12-17 LAB — COMPREHENSIVE METABOLIC PANEL
ALT: 13 U/L (ref 0–35)
AST: 25 U/L (ref 0–37)
Albumin: 3.8 g/dL (ref 3.5–5.2)
Alkaline Phosphatase: 35 U/L — ABNORMAL LOW (ref 39–117)
BUN: 14 mg/dL (ref 6–23)
CO2: 28 mEq/L (ref 19–32)
Calcium: 9 mg/dL (ref 8.4–10.5)
Chloride: 105 mEq/L (ref 96–112)
Creatinine, Ser: 0.72 mg/dL (ref 0.40–1.20)
GFR: 78.68 mL/min (ref >60.00)
Glucose, Bld: 97 mg/dL (ref 70–99)
Potassium: 4.1 mEq/L (ref 3.5–5.1)
Sodium: 137 mEq/L (ref 135–145)
Total Bilirubin: 0.7 mg/dL (ref 0.2–1.2)
Total Protein: 6.1 g/dL (ref 6.0–8.3)

## 2019-12-17 LAB — IBC + FERRITIN
Ferritin: 70 ng/mL (ref 10.0–291.0)
Iron: 98 ug/dL (ref 42–145)
Saturation Ratios: 23.3 % (ref 20.0–50.0)
Transferrin: 301 mg/dL (ref 212.0–360.0)

## 2019-12-17 LAB — CBC
HCT: 36.3 % (ref 36.0–46.0)
Hemoglobin: 11.9 g/dL — ABNORMAL LOW (ref 12.0–15.0)
MCHC: 32.9 g/dL (ref 30.0–36.0)
MCV: 88.8 fl (ref 78.0–100.0)
Platelets: 297 10*3/uL (ref 150.0–400.0)
RBC: 4.08 Mil/uL (ref 3.87–5.11)
RDW: 14.5 % (ref 11.5–15.5)
WBC: 3.7 10*3/uL — ABNORMAL LOW (ref 4.0–10.5)

## 2019-12-17 MED ORDER — SIMVASTATIN 40 MG PO TABS: ORAL_TABLET | ORAL | 3 refills | Status: DC

## 2019-12-17 MED ORDER — PANTOPRAZOLE SODIUM 40 MG PO TBEC: 40.0000 mg | DELAYED_RELEASE_TABLET | Freq: Every day | ORAL | 3 refills | Status: DC

## 2019-12-17 MED ORDER — FENOFIBRATE 160 MG PO TABS: 160.0000 mg | ORAL_TABLET | Freq: Every day | ORAL | 3 refills | Status: DC

## 2019-12-17 MED ORDER — METOPROLOL TARTRATE 100 MG PO TABS: 100.0000 mg | ORAL_TABLET | Freq: Two times a day (BID) | ORAL | 3 refills | Status: DC

## 2019-12-17 NOTE — Assessment & Plan Note (Signed)
Check lipid panel  

## 2019-12-17 NOTE — Assessment & Plan Note (Signed)
Adequate control at home.  Continue current medication.

## 2019-12-17 NOTE — Assessment & Plan Note (Signed)
Refer to [employer].

## 2019-12-17 NOTE — Patient Instructions (Signed)
Nice to see you. We will check lab work today and contact you with the results. I referred you to podiatry and ear nose and throat.

## 2019-12-17 NOTE — Assessment & Plan Note (Signed)
Check iron labs.

## 2019-12-17 NOTE — Assessment & Plan Note (Signed)
Well-controlled.  Continue Protonix.

## 2019-12-17 NOTE — Assessment & Plan Note (Signed)
Refer to ENT

## 2019-12-17 NOTE — Progress Notes (Signed)
Tommi Rumps, MD Phone: [phone]  Lindsay Tran is a 74 y.o. female who presents today for f/u.  HYPERTENSION  Disease Monitoring  Home BP Monitoring 235-573 systolic at home Chest pain- no    Dyspnea- no Medications  Compliance-  Taking metoprolol.  Edema- no  GERD:   Reflux symptoms: no   Abd pain: no   Blood in stool: no  Dysphagia: no   EGD: 2018  Medication: protonix  Globus sensation: Patient notes recently she has felt as though there is been a lump inside her throat.  Occasionally she will stick her finger back there and feel like she will get a tonsil stone dislodged and will feel better for a day or 2 though it will come back.  She does have chronic postnasal drip.  Bunion/overriding second toe/toenail issue right foot: This has been going on for some time now.  She notes the toenail gets sore and is growing in an odd direction.     Social History   Tobacco Use  Smoking Status Never Smoker  Smokeless Tobacco Never Used     ROS see history of present illness  Objective  Physical Exam Vitals:   12/17/19 1030 12/17/19 1046  BP: 140/70 140/80  Pulse: 67   Temp: (!) 96.6 F (35.9 C)   SpO2: 96%     BP Readings from Last 3 Encounters:  12/17/19 140/80  02/19/19 110/70  01/29/19 (!) 152/75   Wt Readings from Last 3 Encounters:  12/17/19 134 lb 12.8 oz (61.1 kg)  07/18/19 140 lb (63.5 kg)  02/19/19 129 lb (58.5 kg)    Physical Exam Constitutional:      General: She is not in acute distress.    Appearance: She is not diaphoretic.  HENT:     Mouth/Throat:     Mouth: Mucous membranes are moist.     Pharynx: Oropharynx is clear. No oropharyngeal exudate or posterior oropharyngeal erythema.  Cardiovascular:     Rate and Rhythm: Normal rate and regular rhythm.     Heart sounds: Normal heart sounds.  Pulmonary:     Effort: Pulmonary effort is normal.     Breath sounds: Normal breath sounds.  Musculoskeletal:     Comments: Right second  toe overriding right great toe, toenail growing towards the tibial aspect of her right great toe, bunion on the right foot  Lymphadenopathy:     Cervical: No cervical adenopathy.  Skin:    General: Skin is warm and dry.  Neurological:     Mental Status: She is alert.      Assessment/Plan: Please see individual problem list.  Benign essential HTN Adequate control at home.  Continue current medication.  Acid reflux Well-controlled.  Continue Protonix.  Globus sensation Refer to ENT.  Toenail deformity Refer to [employer].  Iron deficiency Check iron labs.  Hyperlipidemia Check lipid panel.   Orders Placed This Encounter  Procedures  . Comp Met (CMET)  . Lipid panel  . IBC + Ferritin  . CBC  . Ambulatory referral to Podiatry    Referral Priority:   Routine    Referral Type:   Consultation    Referral Reason:   Specialty Services Required    Requested Specialty:   Podiatry    Number of Visits Requested:   1  . Ambulatory referral to ENT    Referral Priority:   Routine    Referral Type:   Consultation    Referral Reason:   Specialty Services Required  Requested Specialty:   Otolaryngology    Number of Visits Requested:   1    Meds ordered this encounter  Medications  . fenofibrate 160 MG tablet    Sig: Take 1 tablet (160 mg total) by mouth daily.    Dispense:  90 tablet    Refill:  3  . metoprolol tartrate (LOPRESSOR) 100 MG tablet    Sig: Take 1 tablet (100 mg total) by mouth 2 (two) times daily.    Dispense:  180 tablet    Refill:  3  . pantoprazole (PROTONIX) 40 MG tablet    Sig: Take 1 tablet (40 mg total) by mouth daily.    Dispense:  90 tablet    Refill:  3  . simvastatin (ZOCOR) 40 MG tablet    Sig: TAKE 1 TABLET BY MOUTH ONCE DAILY AT  6  PM.    Dispense:  90 tablet    Refill:  3    This visit occurred during the SARS-CoV-2 public health emergency.  Safety protocols were in place, including screening questions prior to the visit,  additional usage of staff PPE, and extensive cleaning of exam room while observing appropriate contact time as indicated for disinfecting solutions.    Tommi Rumps, MD [place]

## 2019-12-18 ENCOUNTER — Telehealth: Payer: Self-pay

## 2019-12-18 NOTE — Telephone Encounter (Signed)
-----   Message from Leone Haven, MD sent at 12/17/2019  3:55 PM EDT ----- Please let the patient know that her WBC is mildly low. I would like to recheck this to see if it returns to normal. Her iron levels are acceptable. Her hemoglobin is barely low though relatively stable to her baseline. Her other labs are acceptable. Order placed.

## 2019-12-22 ENCOUNTER — Other Ambulatory Visit: Payer: Self-pay

## 2019-12-22 ENCOUNTER — Other Ambulatory Visit (INDEPENDENT_AMBULATORY_CARE_PROVIDER_SITE_OTHER): Payer: PPO

## 2019-12-22 DIAGNOSIS — D72819 Decreased white blood cell count, unspecified: Secondary | ICD-10-CM

## 2019-12-22 LAB — CBC WITH DIFFERENTIAL/PLATELET
Basophils Absolute: 0 10*3/uL (ref 0.0–0.1)
Basophils Relative: 0.8 % (ref 0.0–3.0)
Eosinophils Absolute: 0.1 10*3/uL (ref 0.0–0.7)
Eosinophils Relative: 3.3 % (ref 0.0–5.0)
HCT: 37.7 % (ref 36.0–46.0)
Hemoglobin: 12.4 g/dL (ref 12.0–15.0)
Lymphocytes Relative: 32.9 % (ref 12.0–46.0)
Lymphs Abs: 1.5 10*3/uL (ref 0.7–4.0)
MCHC: 33 g/dL (ref 30.0–36.0)
MCV: 88.5 fl (ref 78.0–100.0)
Monocytes Absolute: 0.5 10*3/uL (ref 0.1–1.0)
Monocytes Relative: 10.9 % (ref 3.0–12.0)
Neutro Abs: 2.3 10*3/uL (ref 1.4–7.7)
Neutrophils Relative %: 52.1 % (ref 43.0–77.0)
Platelets: 357 10*3/uL (ref 150.0–400.0)
RBC: 4.26 Mil/uL (ref 3.87–5.11)
RDW: 14.4 % (ref 11.5–15.5)
WBC: 4.5 10*3/uL (ref 4.0–10.5)

## 2019-12-29 DIAGNOSIS — M216X2 Other acquired deformities of left foot: Secondary | ICD-10-CM | POA: Diagnosis not present

## 2019-12-29 DIAGNOSIS — M216X1 Other acquired deformities of right foot: Secondary | ICD-10-CM | POA: Diagnosis not present

## 2019-12-29 DIAGNOSIS — M79674 Pain in right toe(s): Secondary | ICD-10-CM | POA: Diagnosis not present

## 2019-12-29 DIAGNOSIS — M249 Joint derangement, unspecified: Secondary | ICD-10-CM | POA: Diagnosis not present

## 2019-12-29 DIAGNOSIS — L601 Onycholysis: Secondary | ICD-10-CM | POA: Diagnosis not present

## 2019-12-29 DIAGNOSIS — M2011 Hallux valgus (acquired), right foot: Secondary | ICD-10-CM | POA: Diagnosis not present

## 2019-12-29 DIAGNOSIS — M2142 Flat foot [pes planus] (acquired), left foot: Secondary | ICD-10-CM | POA: Diagnosis not present

## 2019-12-29 DIAGNOSIS — M2041 Other hammer toe(s) (acquired), right foot: Secondary | ICD-10-CM | POA: Diagnosis not present

## 2019-12-29 DIAGNOSIS — M2141 Flat foot [pes planus] (acquired), right foot: Secondary | ICD-10-CM | POA: Diagnosis not present

## 2019-12-29 DIAGNOSIS — M2042 Other hammer toe(s) (acquired), left foot: Secondary | ICD-10-CM | POA: Diagnosis not present

## 2019-12-29 DIAGNOSIS — B351 Tinea unguium: Secondary | ICD-10-CM | POA: Diagnosis not present

## 2019-12-31 ENCOUNTER — Encounter (INDEPENDENT_AMBULATORY_CARE_PROVIDER_SITE_OTHER): Payer: Self-pay | Admitting: Otolaryngology

## 2019-12-31 ENCOUNTER — Other Ambulatory Visit: Payer: Self-pay

## 2019-12-31 ENCOUNTER — Ambulatory Visit (INDEPENDENT_AMBULATORY_CARE_PROVIDER_SITE_OTHER): Payer: PPO | Admitting: Otolaryngology

## 2019-12-31 VITALS — Temp 97.2°F

## 2019-12-31 DIAGNOSIS — R0989 Other specified symptoms and signs involving the circulatory and respiratory systems: Secondary | ICD-10-CM

## 2019-12-31 DIAGNOSIS — K219 Gastro-esophageal reflux disease without esophagitis: Secondary | ICD-10-CM

## 2019-12-31 NOTE — Progress Notes (Signed)
HPI: Lindsay Tran is a 74 y.o. female who presents is referred by Tommi Rumps for evaluation of globus sensation.  Patient has had a sensation of something in her throat on the right side for the past 6 months.  She points to an area just below the tonsil on the right side in the region of the base of tongue.  Sensation comes and goes. Patient apparently has had hiatal hernia repair several years ago because of reflux symptoms. She denies any sore throat.  She does not have any trouble swallowing or eating food..  Past Medical History:  Diagnosis Date  . Abdominal pain   . Anemia   . Anxiety    adjustment disorder with anxiety  . Arthritis   . BPPV (benign paroxysmal positional vertigo)   . Cataract   . Chronic diarrhea   . Complication of anesthesia   . Coronary artery disease   . Dysphasia   . Gastric ulcer 1980's  . GERD (gastroesophageal reflux disease)   . High cholesterol   . Hypertension   . Osteoporosis   . Schatzki's ring 05/02/2013  . Scoliosis    Past Surgical History:  Procedure Laterality Date  . ABDOMINAL HYSTERECTOMY    . CARDIAC CATHETERIZATION    . CHOLECYSTECTOMY    . COLONOSCOPY    . COLONOSCOPY N/A 11/12/2014   Procedure: COLONOSCOPY;  Surgeon: Manya Silvas, MD;  Location: [facility] ENDOSCOPY;  Service: Endoscopy;  Laterality: N/A;  . DIAGNOSTIC LAPAROSCOPY     paraesphogeal hernia  . ESOPHAGOGASTRODUODENOSCOPY    . ESOPHAGOGASTRODUODENOSCOPY (EGD) WITH PROPOFOL N/A 04/16/2017   Procedure: ESOPHAGOGASTRODUODENOSCOPY (EGD) WITH PROPOFOL;  Surgeon: Manya Silvas, MD;  Location: [facility] ENDOSCOPY;  Service: Endoscopy;  Laterality: N/A;  . HERNIA REPAIR     inguinal hernia   Social History   Socioeconomic History  . Marital status: Single    Spouse name: Not on file  . Number of children: Not on file  . Years of education: Not on file  . Highest education level: Not on file  Occupational History  . Not on file  Tobacco Use  . Smoking status:  Never Smoker  . Smokeless tobacco: Never Used  Vaping Use  . Vaping Use: Never used  Substance and Sexual Activity  . Alcohol use: No    Alcohol/week: 0.0 standard drinks  . Drug use: No  . Sexual activity: Never  Other Topics Concern  . Not on file  Social History Narrative   Divorced   Retired as a Scientist, research (medical)   12 + years of school    Social Determinants of Radio broadcast assistant Strain:   . Difficulty of Paying Living Expenses:   Food Insecurity:   . Worried About Charity fundraiser in the Last Year:   . Arboriculturist in the Last Year:   Transportation Needs:   . Film/video editor (Medical):   Marland Kitchen Lack of Transportation (Non-Medical):   Physical Activity:   . Days of Exercise per Week:   . Minutes of Exercise per Session:   Stress: No Stress Concern Present  . Feeling of Stress : Not at all  Social Connections:   . Frequency of Communication with Friends and Family:   . Frequency of Social Gatherings with Friends and Family:   . Attends Religious Services:   . Active Member of Clubs or Organizations:   . Attends Archivist Meetings:   Marland Kitchen Marital Status:    Family  History  Problem Relation Age of Onset  . Diabetes Mother   . Parkinson's disease Mother   . Cancer Father        Pancreatic  . Cancer Sister        Lung  . Diabetes Sister   . Alzheimer's disease Paternal Aunt        x2 sisters  . Diabetes Sister   . Alzheimer's disease Sister   . Tics Sister   . Breast cancer Neg Hx    No Known Allergies Prior to Admission medications   Medication Sig Start Date End Date Taking? Authorizing Provider  aspirin 81 MG tablet Take 81 mg by mouth daily.   Yes [provider]  clobetasol (OLUX) 0.05 % topical foam Apply topically 2 (two) times daily. 01/15/19  Yes Leone Haven, MD  clobetasol ointment (TEMOVATE) 2.87 % Apply 1 application topically 2 (two) times daily as needed. For rash. Avoid face. 01/14/19  Yes Leone Haven, MD  conjugated estrogens (PREMARIN) vaginal cream INSERT ONE APPLICATORFUL VAGINALLY TWICE WEEKLY 07/18/19  Yes Leone Haven, MD  Cyanocobalamin (VITAMIN B 12 PO) Take by mouth.   Yes [provider]  fenofibrate 160 MG tablet Take 1 tablet (160 mg total) by mouth daily. 12/17/19  Yes Leone Haven, MD  ketoconazole (NIZORAL) 2 % cream Apply 1 application topically daily as needed.  08/07/16  Yes [provider]  meclizine (ANTIVERT) 25 MG tablet Take 1 tablet (25 mg total) by mouth 2 (two) times daily as needed for dizziness. 12/24/15  Yes Leone Haven, MD  methocarbamol (ROBAXIN) 500 MG tablet  10/04/17  Yes [provider]  metoprolol tartrate (LOPRESSOR) 100 MG tablet Take 1 tablet (100 mg total) by mouth 2 (two) times daily. 12/17/19  Yes Leone Haven, MD  Misc Natural Products (OSTEO BI-FLEX/5-LOXIN ADVANCED) TABS Take 1 tablet by mouth 2 (two) times daily.   Yes [provider]  Multiple Vitamins-Minerals (PRESERVISION AREDS 2 PO) Take by mouth.   Yes [provider]  mupirocin ointment (BACTROBAN) 2 % Place 1 application into the nose 2 (two) times daily. 09/15/18  Yes Leone Haven, MD  naproxen sodium (ANAPROX) 220 MG tablet Take 220 mg by mouth daily. Sometimes pt takes twice a day if needed from pain   Yes [provider]  Omega-3 Fatty Acids (OMEGA 3 500 PO) Take 1 capsule by mouth daily.    Yes [provider]  pantoprazole (PROTONIX) 40 MG tablet Take 1 tablet (40 mg total) by mouth daily. 12/17/19  Yes Leone Haven, MD  potassium chloride (K-DUR) 10 MEQ tablet Take 10 mEq by mouth daily.   Yes [provider]  Probiotic Product (PROBIOTIC DAILY) CAPS Take 1 capsule by mouth.   Yes [provider]  simvastatin (ZOCOR) 40 MG tablet TAKE 1 TABLET BY MOUTH ONCE DAILY AT  6  PM. 12/17/19  Yes Leone Haven, MD  triamcinolone cream (KENALOG) 0.1 % APPLY TOPICALLY TWICE DAILY AS  NEEDED FOR RASH. AVOID FACE 09/16/19  Yes Leone Haven, MD     Positive ROS: Otherwise negative  All other systems have been reviewed and were otherwise negative with the exception of those mentioned in the HPI and as above.  Physical Exam: Constitutional: Alert, well-appearing, no acute distress Ears: External ears without lesions or tenderness. Ear canals are clear bilaterally with intact, clear TMs.  Nasal: External nose without lesions. Septum midline. Clear nasal passages bilaterally  with no signs of infection. Oral: Lips and gums without lesions. Tongue and palate mucosa without lesions. Posterior oropharynx clear.  Tonsil regions appear benign bilaterally.  Indirect laryngoscopy revealed a clear base of tongue vallecula and epiglottis.  Palpation of the tonsil region base of tongue vallecula was normal to palpation with no palpable masses. Fiberoptic laryngoscopy was performed through the right nostril.  The nasopharynx was clear.  The base of tongue vallecula and epiglottis were normal on appearance.  Piriform sinuses were clear bilaterally.  AE folds were normal.  Vocal cords were clear bilaterally. Neck: No palpable adenopathy or masses.  No palpable adenopathy on the right sided neck. Respiratory: Breathing comfortably  Skin: No facial/neck lesions or rash noted.  Laryngoscopy  Date/Time: 12/31/2019 12:21 PM Performed by: Rozetta Nunnery, MD Authorized by: Rozetta Nunnery, MD   Consent:    Consent obtained:  Verbal   Consent given by:  Patient Procedure details:    Indications: direct visualization of the upper aerodigestive tract     Medication:  Afrin   Instrument: flexible fiberoptic laryngoscope     Scope location: right nare   Sinus:    Right nasopharynx: normal   Mouth:    Oropharynx: normal     Vallecula: normal     Base of tongue: normal   Throat:    Pyriform sinus: normal     True vocal cords: normal   Comments:     On fiberoptic  laryngoscopy hypopharynx and larynx was clear with no abnormalities noted.    Assessment: Right-sided globus type symptoms.  Possibly related to GE reflux disease. Clear upper airway examination clinically with no evidence of neoplasm or infection.  Plan: Reassured patient of normal upper airway examination with no evidence of anatomical abnormality or neoplasm. Suggested possibly gargling with Listerine twice a day for 1 week. She is already taking pantoprazole.   Radene Journey, MD   CC:

## 2020-01-07 ENCOUNTER — Telehealth: Payer: Self-pay

## 2020-01-07 NOTE — Telephone Encounter (Signed)
spoken to pharmacy, they would like to know if it is ok for her to take the finofibrate and simvastain? or one or the other. [facility].

## 2020-01-07 NOTE — Telephone Encounter (Signed)
Message sent to brock through secure chat.

## 2020-01-16 ENCOUNTER — Ambulatory Visit (INDEPENDENT_AMBULATORY_CARE_PROVIDER_SITE_OTHER): Payer: PPO

## 2020-01-16 VITALS — Ht 60.0 in | Wt 134.0 lb

## 2020-01-16 DIAGNOSIS — Z0000 Encounter for general adult medical examination without abnormal findings: Secondary | ICD-10-CM | POA: Diagnosis not present

## 2020-01-16 NOTE — Progress Notes (Signed)
Subjective:   Lindsay Tran is a 74 y.o. female who presents for Medicare Annual (Subsequent) preventive examination.  Review of Systems    No ROS.  Medicare Wellness Virtual Visit.   Cardiac Risk Factors include: advanced age (>24men, >71 women)     Objective:    Today's Vitals   01/16/20 1416  Weight: 134 lb (60.8 kg)  Height: 5' (1.524 m)   Body mass index is 26.17 kg/m.  Advanced Directives 01/16/2020 01/15/2019 06/18/2018 10/02/2017 06/15/2017 05/01/2017 04/16/2017  Does Patient Have a Medical Advance Directive? Yes Yes No Yes Yes No No  Type of Paramedic of [place];Living will [place];Living will - [place];Living will [place];Living will - -  Does patient want to make changes to medical advance directive? No - Patient declined No - Patient declined - No - Patient declined - - -  Copy of [place] in Chart? Yes - validated most recent copy scanned in chart (See row information) Yes - validated most recent copy scanned in chart (See row information) - Yes Yes - -  Would patient like information on creating a medical advance directive? - - No - Patient declined - - - -    Current Medications (verified) Outpatient Encounter Medications as of 01/16/2020  Medication Sig  . aspirin 81 MG tablet Take 81 mg by mouth daily.  . clobetasol (OLUX) 0.05 % topical foam Apply topically 2 (two) times daily.  . clobetasol ointment (TEMOVATE) 8.11 % Apply 1 application topically 2 (two) times daily as needed. For rash. Avoid face.  . conjugated estrogens (PREMARIN) vaginal cream INSERT ONE APPLICATORFUL VAGINALLY TWICE WEEKLY  . Cyanocobalamin (VITAMIN B 12 PO) Take by mouth.  . fenofibrate 160 MG tablet Take 1 tablet (160 mg total) by mouth daily.  Marland Kitchen ketoconazole (NIZORAL) 2 % cream Apply 1 application topically daily as needed.   . meclizine (ANTIVERT) 25 MG tablet Take 1 tablet (25 mg  total) by mouth 2 (two) times daily as needed for dizziness.  . methocarbamol (ROBAXIN) 500 MG tablet   . metoprolol tartrate (LOPRESSOR) 100 MG tablet Take 1 tablet (100 mg total) by mouth 2 (two) times daily.  . Misc Natural Products (OSTEO BI-FLEX/5-LOXIN ADVANCED) TABS Take 1 tablet by mouth 2 (two) times daily.  . Multiple Vitamins-Minerals (PRESERVISION AREDS 2 PO) Take by mouth.  . mupirocin ointment (BACTROBAN) 2 % Place 1 application into the nose 2 (two) times daily.  . naproxen sodium (ANAPROX) 220 MG tablet Take 220 mg by mouth daily. Sometimes pt takes twice a day if needed from pain  . Omega-3 Fatty Acids (OMEGA 3 500 PO) Take 1 capsule by mouth daily.   . pantoprazole (PROTONIX) 40 MG tablet Take 1 tablet (40 mg total) by mouth daily.  . potassium chloride (K-DUR) 10 MEQ tablet Take 10 mEq by mouth daily.  . Probiotic Product (PROBIOTIC DAILY) CAPS Take 1 capsule by mouth.  . simvastatin (ZOCOR) 40 MG tablet TAKE 1 TABLET BY MOUTH ONCE DAILY AT  6  PM.  . triamcinolone cream (KENALOG) 0.1 % APPLY TOPICALLY TWICE DAILY AS NEEDED FOR RASH. AVOID FACE   No facility-administered encounter medications on file as of 01/16/2020.    Allergies (verified) Patient has no known allergies.   History: Past Medical History:  Diagnosis Date  . Abdominal pain   . Anemia   . Anxiety    adjustment disorder with anxiety  . Arthritis   .  BPPV (benign paroxysmal positional vertigo)   . Cataract   . Chronic diarrhea   . Complication of anesthesia   . Coronary artery disease   . Dysphasia   . Gastric ulcer 1980's  . GERD (gastroesophageal reflux disease)   . High cholesterol   . Hypertension   . Osteoporosis   . Schatzki's ring 05/02/2013  . Scoliosis    Past Surgical History:  Procedure Laterality Date  . ABDOMINAL HYSTERECTOMY    . CARDIAC CATHETERIZATION    . CHOLECYSTECTOMY    . COLONOSCOPY    . COLONOSCOPY N/A 11/12/2014   Procedure: COLONOSCOPY;  Surgeon: Manya Silvas,  MD;  Location: [facility] ENDOSCOPY;  Service: Endoscopy;  Laterality: N/A;  . DIAGNOSTIC LAPAROSCOPY     paraesphogeal hernia  . ESOPHAGOGASTRODUODENOSCOPY    . ESOPHAGOGASTRODUODENOSCOPY (EGD) WITH PROPOFOL N/A 04/16/2017   Procedure: ESOPHAGOGASTRODUODENOSCOPY (EGD) WITH PROPOFOL;  Surgeon: Manya Silvas, MD;  Location: [facility] ENDOSCOPY;  Service: Endoscopy;  Laterality: N/A;  . HERNIA REPAIR     inguinal hernia   Family History  Problem Relation Age of Onset  . Diabetes Mother   . Parkinson's disease Mother   . Cancer Father        Pancreatic  . Cancer Sister        Lung  . Diabetes Sister   . Alzheimer's disease Paternal Aunt        x2 sisters  . Diabetes Sister   . Alzheimer's disease Sister   . Tics Sister   . Breast cancer Neg Hx    Social History   Socioeconomic History  . Marital status: Single    Spouse name: Not on file  . Number of children: Not on file  . Years of education: Not on file  . Highest education level: Not on file  Occupational History  . Not on file  Tobacco Use  . Smoking status: Never Smoker  . Smokeless tobacco: Never Used  Vaping Use  . Vaping Use: Never used  Substance and Sexual Activity  . Alcohol use: No    Alcohol/week: 0.0 standard drinks  . Drug use: No  . Sexual activity: Never  Other Topics Concern  . Not on file  Social History Narrative   Divorced   Retired as a Scientist, research (medical)   12 + years of school    Social Determinants of Radio broadcast assistant Strain:   . Difficulty of Paying Living Expenses:   Food Insecurity:   . Worried About Charity fundraiser in the Last Year:   . Arboriculturist in the Last Year:   Transportation Needs:   . Film/video editor (Medical):   Marland Kitchen Lack of Transportation (Non-Medical):   Physical Activity:   . Days of Exercise per Week:   . Minutes of Exercise per Session:   Stress:   . Feeling of Stress :   Social Connections: Unknown  . Frequency of Communication with Friends and  Family: More than three times a week  . Frequency of Social Gatherings with Friends and Family: More than three times a week  . Attends Religious Services: Not on file  . Active Member of Clubs or Organizations: Not on file  . Attends Archivist Meetings: Not on file  . Marital Status: Not on file    Tobacco Counseling Counseling given: Not Answered   Clinical Intake:  Pre-visit preparation completed: Yes        Diabetes: No  How often do  you need to have someone help you when you read instructions, pamphlets, or other written materials from your doctor or pharmacy?: 1 - Never Interpreter Needed?: No      Activities of Daily Living In your present state of health, do you have any difficulty performing the following activities: 01/16/2020  Hearing? N  Vision? N  Difficulty concentrating or making decisions? N  Walking or climbing stairs? N  Dressing or bathing? N  Doing errands, shopping? N  Preparing Food and eating ? N  Using the Toilet? N  In the past six months, have you accidently leaked urine? N  Do you have problems with loss of bowel control? N  Managing your Medications? N  Managing your Finances? N  Housekeeping or managing your Housekeeping? N  Some recent data might be hidden    Patient Care Team: Leone Haven, MD as PCP - General (Family Medicine)  Indicate any recent Medical Services you may have received from other than [facility] providers in the past year (date may be approximate).     Assessment:   This is a routine wellness examination for Lindsay Tran.  I connected with [place] today by telephone and verified that I am speaking with the correct person using two identifiers. Location patient: home Location provider: work Persons participating in the virtual visit: patient, Marine scientist.    I discussed the limitations, risks, security and privacy concerns of performing an evaluation and management service by telephone and the availability of in  person appointments. The patient expressed understanding and verbally consented to this telephonic visit.    Interactive audio and video telecommunications were attempted between this provider and patient, however failed, due to patient having technical difficulties OR patient did not have access to video capability.  We continued and completed visit with audio only.  Some vital signs may be absent or patient reported.   Hearing/Vision screen  Hearing Screening   125Hz  250Hz  500Hz  1000Hz  2000Hz  3000Hz  4000Hz  6000Hz  8000Hz   Right ear:           Left ear:           Comments: Patient is able to hear conversational tones without difficulty.  No issues reported.  Vision Screening Comments: Followed by [facility] Wears corrective lenses Visual acuity not assessed, virtual visit.  They have seen their ophthalmologist in the last 12 months.   Dietary issues and exercise activities discussed:  Healthy diet Good water intake  Goals    . Maintain Healthy Lifestyle     Stay active Stay hydrated Healthy diet      Depression Screen PHQ 2/9 Scores 01/16/2020 12/17/2019 07/18/2019 01/15/2019 02/12/2018 10/02/2017 09/29/2016  PHQ - 2 Score 1 0 0 0 0 0 0    Fall Risk Fall Risk  01/16/2020 12/17/2019 07/18/2019 05/30/2019 01/15/2019  Falls in the past year? 0 0 0 0 0  Comment - - - Emmi Telephone Survey: data to providers prior to load -  Number falls in past yr: 0 0 0 - -  Follow up Falls evaluation completed Falls evaluation completed Falls evaluation completed - -   Handrails in use when climbing stairs? Yes  Home free of loose throw rugs in walkways, pet beds, electrical cords, etc? Yes  Adequate lighting in your home to reduce risk of falls? Yes   ASSISTIVE DEVICES UTILIZED TO PREVENT FALLS: Life alert? No  Use of a cane, walker or w/c? Yes , cane as needed.  Grab bars in the bathroom? Yes  Shower chair or bench in shower? No  Elevated toilet seat or a handicapped toilet? Yes   TIMED UP  AND GO: Was the test performed? No . Virtual visit.  Cognitive Function: MMSE - Mini Mental State Exam 09/29/2016 09/30/2015  Orientation to time 5 5  Orientation to Place 5 5  Registration 3 3  Attention/ Calculation 5 5  Recall 3 3  Language- name 2 objects 2 2  Language- repeat 1 1  Language- follow 3 step command 3 3  Language- read & follow direction 1 1  Write a sentence 1 1  Copy design 1 1  Total score 30 30     6CIT Screen 01/16/2020 01/15/2019 10/02/2017  What Year? 0 points 0 points 0 points  What month? 0 points 0 points 0 points  What time? 0 points 0 points 0 points  Count back from 20 - 0 points 0 points  Months in reverse 0 points 0 points 0 points  Repeat phrase 0 points 0 points 0 points  Total Score - 0 0   Immunizations Immunization History  Administered Date(s) Administered  . Fluad Quad(high Dose 65+) 03/31/2019  . Influenza, High Dose Seasonal PF 03/31/2016, 04/06/2017, 03/27/2018, 03/29/2018  . Influenza-Unspecified 04/14/2015  . Pneumococcal Conjugate-13 09/30/2015  . Pneumococcal-Unspecified 07/17/2011  . Tdap 07/21/2015  . Zoster 08/17/2011   Health Maintenance Health Maintenance  Topic Date Due  . COVID-19 Vaccine (1) Never done  . INFLUENZA VACCINE  02/08/2020  . TETANUS/TDAP  07/20/2025  . DEXA SCAN  Completed  . Hepatitis C Screening  Completed  . PNA vac Low Risk Adult  Completed   Dental Screening: Recommended annual dental exams for proper oral hygiene  Community Resource Referral / Chronic Care Management: CRR required this visit?  No   CCM required this visit?  No      Plan:   Keep all routine maintenance appointments.   Follow up 06/21/20 @ 10:30  I have personally reviewed and noted the following in the patient's chart:   . Medical and social history . Use of alcohol, tobacco or illicit drugs  . Current medications and supplements . Functional ability and status . Nutritional status . Physical activity . Advanced  directives . List of other physicians . Hospitalizations, surgeries, and ER visits in previous 12 months . Vitals . Screenings to include cognitive, depression, and falls . Referrals and appointments  In addition, I have reviewed and discussed with patient certain preventive protocols, quality metrics, and best practice recommendations. A written personalized care plan for preventive services as well as general preventive health recommendations were provided to patient via mychart.     Varney Biles, LPN   0/12/43

## 2020-01-16 NOTE — Patient Instructions (Addendum)
Lindsay Tran , Thank you for taking time to come for your Medicare Wellness Visit. I appreciate your ongoing commitment to your health goals. Please review the following plan we discussed and let me know if I can assist you in the future.   These are the goals we discussed: Goals     Maintain Healthy Lifestyle     Stay active Stay hydrated Healthy diet       This is a list of the screening recommended for you and due dates:  Health Maintenance  Topic Date Due   COVID-19 Vaccine (1) Never done   Flu Shot  02/08/2020   Tetanus Vaccine  07/20/2025   DEXA scan (bone density measurement)  Completed    Hepatitis C: One time screening is recommended by Center for Disease Control  (CDC) for  adults born from [birth year] through 1967.   Completed   Pneumonia vaccines  Completed    Immunizations Immunization History  Administered Date(s) Administered   Fluad Quad(high Dose 65+) 03/31/2019   Influenza, High Dose Seasonal PF 03/31/2016, 04/06/2017, 03/27/2018, 03/29/2018   Influenza-Unspecified 04/14/2015   Pneumococcal Conjugate-13 09/30/2015   Pneumococcal-Unspecified 07/17/2011   Tdap 07/21/2015   Zoster 08/17/2011   Advanced directives: yes, on file  Conditions/risks identified: none new.   Follow up in one year for your annual wellness visit.  Keep all routine maintenance appointments.   Follow up 06/21/20 @ 10:30  Preventive Care 65 Years and Older, Female Preventive care refers to lifestyle choices and visits with your health care provider that can promote health and wellness. What does preventive care include?  A yearly physical exam. This is also called an annual well check.  Dental exams once or twice a year.  Routine eye exams. Ask your health care provider how often you should have your eyes checked.  Personal lifestyle choices, including:  Daily care of your teeth and gums.  Regular physical activity.  Eating a healthy diet.  Avoiding tobacco and  drug use.  Limiting alcohol use.  Practicing safe sex.  Taking low-dose aspirin every day.  Taking vitamin and mineral supplements as recommended by your health care provider. What happens during an annual well check? The services and screenings done by your health care provider during your annual well check will depend on your age, overall health, lifestyle risk factors, and family history of disease. Counseling  Your health care provider may ask you questions about your:  Alcohol use.  Tobacco use.  Drug use.  Emotional well-being.  Home and relationship well-being.  Sexual activity.  Eating habits.  History of falls.  Memory and ability to understand (cognition).  Work and work Statistician.  Reproductive health. Screening  You may have the following tests or measurements:  Height, weight, and BMI.  Blood pressure.  Lipid and cholesterol levels. These may be checked every 5 years, or more frequently if you are over 4 years old.  Skin check.  Lung cancer screening. You may have this screening every year starting at age 65 if you have a 30-pack-year history of smoking and currently smoke or have quit within the past 15 years.  Fecal occult blood test (FOBT) of the stool. You may have this test every year starting at age 74.  Flexible sigmoidoscopy or colonoscopy. You may have a sigmoidoscopy every 5 years or a colonoscopy every 10 years starting at age 52.  Hepatitis C blood test.  Hepatitis B blood test.  Sexually transmitted disease (STD) testing.  Diabetes screening. This  is done by checking your blood sugar (glucose) after you have not eaten for a while (fasting). You may have this done every 1-3 years.  Bone density scan. This is done to screen for osteoporosis. You may have this done starting at age 97.  Mammogram. This may be done every 1-2 years. Talk to your health care provider about how often you should have regular mammograms. Talk with your  health care provider about your test results, treatment options, and if necessary, the need for more tests. Vaccines  Your health care provider may recommend certain vaccines, such as:  Influenza vaccine. This is recommended every year.  Tetanus, diphtheria, and acellular pertussis (Tdap, Td) vaccine. You may need a Td booster every 10 years.  Zoster vaccine. You may need this after age 58.  Pneumococcal 13-valent conjugate (PCV13) vaccine. One dose is recommended after age 53.  Pneumococcal polysaccharide (PPSV23) vaccine. One dose is recommended after age 35. Talk to your health care provider about which screenings and vaccines you need and how often you need them. This information is not intended to replace advice given to you by your health care provider. Make sure you discuss any questions you have with your health care provider. Document Released: 07/23/2015 Document Revised: 03/15/2016 Document Reviewed: 04/27/2015 Elsevier Interactive Patient Education  2017 [place] Prevention in the Home Falls can cause injuries. They can happen to people of all ages. There are many things you can do to make your home safe and to help prevent falls. What can I do on the outside of my home?  Regularly fix the edges of walkways and driveways and fix any cracks.  Remove anything that might make you trip as you walk through a door, such as a raised step or threshold.  Trim any bushes or trees on the path to your home.  Use bright outdoor lighting.  Clear any walking paths of anything that might make someone trip, such as rocks or tools.  Regularly check to see if handrails are loose or broken. Make sure that both sides of any steps have handrails.  Any raised decks and porches should have guardrails on the edges.  Have any leaves, snow, or ice cleared regularly.  Use sand or salt on walking paths during winter.  Clean up any spills in your garage right away. This includes oil  or grease spills. What can I do in the bathroom?  Use night lights.  Install grab bars by the toilet and in the tub and shower. Do not use towel bars as grab bars.  Use non-skid mats or decals in the tub or shower.  If you need to sit down in the shower, use a plastic, non-slip stool.  Keep the floor dry. Clean up any water that spills on the floor as soon as it happens.  Remove soap buildup in the tub or shower regularly.  Attach bath mats securely with double-sided non-slip rug tape.  Do not have throw rugs and other things on the floor that can make you trip. What can I do in the bedroom?  Use night lights.  Make sure that you have a light by your bed that is easy to reach.  Do not use any sheets or blankets that are too big for your bed. They should not hang down onto the floor.  Have a firm chair that has side arms. You can use this for support while you get dressed.  Do not have throw rugs and other  things on the floor that can make you trip. What can I do in the kitchen?  Clean up any spills right away.  Avoid walking on wet floors.  Keep items that you use a lot in easy-to-reach places.  If you need to reach something above you, use a strong step stool that has a grab bar.  Keep electrical cords out of the way.  Do not use floor polish or wax that makes floors slippery. If you must use wax, use non-skid floor wax.  Do not have throw rugs and other things on the floor that can make you trip. What can I do with my stairs?  Do not leave any items on the stairs.  Make sure that there are handrails on both sides of the stairs and use them. Fix handrails that are broken or loose. Make sure that handrails are as long as the stairways.  Check any carpeting to make sure that it is firmly attached to the stairs. Fix any carpet that is loose or worn.  Avoid having throw rugs at the top or bottom of the stairs. If you do have throw rugs, attach them to the floor with  carpet tape.  Make sure that you have a light switch at the top of the stairs and the bottom of the stairs. If you do not have them, ask someone to add them for you. What else can I do to help prevent falls?  Wear shoes that:  Do not have high heels.  Have rubber bottoms.  Are comfortable and fit you well.  Are closed at the toe. Do not wear sandals.  If you use a stepladder:  Make sure that it is fully opened. Do not climb a closed stepladder.  Make sure that both sides of the stepladder are locked into place.  Ask someone to hold it for you, if possible.  Clearly mark and make sure that you can see:  Any grab bars or handrails.  First and last steps.  Where the edge of each step is.  Use tools that help you move around (mobility aids) if they are needed. These include:  Canes.  Walkers.  Scooters.  Crutches.  Turn on the lights when you go into a dark area. Replace any light bulbs as soon as they burn out.  Set up your furniture so you have a clear path. Avoid moving your furniture around.  If any of your floors are uneven, fix them.  If there are any pets around you, be aware of where they are.  Review your medicines with your doctor. Some medicines can make you feel dizzy. This can increase your chance of falling. Ask your doctor what other things that you can do to help prevent falls. This information is not intended to replace advice given to you by your health care provider. Make sure you discuss any questions you have with your health care provider. Document Released: 04/22/2009 Document Revised: 12/02/2015 Document Reviewed: 07/31/2014 Elsevier Interactive Patient Education  2017 [employer].

## 2020-01-29 DIAGNOSIS — M1712 Unilateral primary osteoarthritis, left knee: Secondary | ICD-10-CM | POA: Diagnosis not present

## 2020-02-13 DIAGNOSIS — M25562 Pain in left knee: Secondary | ICD-10-CM | POA: Diagnosis not present

## 2020-02-13 DIAGNOSIS — M25662 Stiffness of left knee, not elsewhere classified: Secondary | ICD-10-CM | POA: Diagnosis not present

## 2020-02-17 DIAGNOSIS — M1712 Unilateral primary osteoarthritis, left knee: Secondary | ICD-10-CM | POA: Diagnosis not present

## 2020-02-24 DIAGNOSIS — M25662 Stiffness of left knee, not elsewhere classified: Secondary | ICD-10-CM | POA: Diagnosis not present

## 2020-02-24 DIAGNOSIS — M1712 Unilateral primary osteoarthritis, left knee: Secondary | ICD-10-CM | POA: Diagnosis not present

## 2020-02-24 DIAGNOSIS — M25562 Pain in left knee: Secondary | ICD-10-CM | POA: Diagnosis not present

## 2020-03-01 DIAGNOSIS — M25662 Stiffness of left knee, not elsewhere classified: Secondary | ICD-10-CM | POA: Diagnosis not present

## 2020-03-01 DIAGNOSIS — M25562 Pain in left knee: Secondary | ICD-10-CM | POA: Diagnosis not present

## 2020-03-01 DIAGNOSIS — R600 Localized edema: Secondary | ICD-10-CM | POA: Diagnosis not present

## 2020-03-02 DIAGNOSIS — M1712 Unilateral primary osteoarthritis, left knee: Secondary | ICD-10-CM | POA: Diagnosis not present

## 2020-03-08 DIAGNOSIS — M25562 Pain in left knee: Secondary | ICD-10-CM | POA: Diagnosis not present

## 2020-03-08 DIAGNOSIS — M25662 Stiffness of left knee, not elsewhere classified: Secondary | ICD-10-CM | POA: Diagnosis not present

## 2020-03-08 DIAGNOSIS — M1712 Unilateral primary osteoarthritis, left knee: Secondary | ICD-10-CM | POA: Diagnosis not present

## 2020-04-06 ENCOUNTER — Other Ambulatory Visit: Payer: Self-pay | Admitting: Family Medicine

## 2020-04-06 DIAGNOSIS — Z1231 Encounter for screening mammogram for malignant neoplasm of breast: Secondary | ICD-10-CM

## 2020-04-14 DIAGNOSIS — K219 Gastro-esophageal reflux disease without esophagitis: Secondary | ICD-10-CM | POA: Diagnosis not present

## 2020-04-14 DIAGNOSIS — R143 Flatulence: Secondary | ICD-10-CM | POA: Diagnosis not present

## 2020-04-14 DIAGNOSIS — Z8371 Family history of colonic polyps: Secondary | ICD-10-CM | POA: Diagnosis not present

## 2020-04-14 DIAGNOSIS — R1319 Other dysphagia: Secondary | ICD-10-CM | POA: Diagnosis not present

## 2020-04-14 DIAGNOSIS — K591 Functional diarrhea: Secondary | ICD-10-CM | POA: Diagnosis not present

## 2020-04-14 DIAGNOSIS — R140 Abdominal distension (gaseous): Secondary | ICD-10-CM | POA: Diagnosis not present

## 2020-04-30 ENCOUNTER — Ambulatory Visit
Admission: RE | Admit: 2020-04-30 | Discharge: 2020-04-30 | Disposition: A | Discharge status: Home / Self Care | Payer: PPO | Source: Ambulatory Visit | Attending: Family Medicine | Admitting: Family Medicine

## 2020-04-30 ENCOUNTER — Other Ambulatory Visit: Payer: Self-pay

## 2020-04-30 DIAGNOSIS — Z1231 Encounter for screening mammogram for malignant neoplasm of breast: Secondary | ICD-10-CM | POA: Diagnosis not present

## 2020-05-20 ENCOUNTER — Other Ambulatory Visit
Admission: RE | Admit: 2020-05-20 | Discharge: 2020-05-20 | Disposition: A | Discharge status: Home / Self Care | Payer: PPO | Source: Ambulatory Visit | Attending: Internal Medicine | Admitting: Internal Medicine

## 2020-05-20 ENCOUNTER — Other Ambulatory Visit: Payer: Self-pay

## 2020-05-20 DIAGNOSIS — Z20822 Contact with and (suspected) exposure to covid-19: Secondary | ICD-10-CM | POA: Insufficient documentation

## 2020-05-20 DIAGNOSIS — Z01812 Encounter for preprocedural laboratory examination: Secondary | ICD-10-CM | POA: Insufficient documentation

## 2020-05-21 ENCOUNTER — Encounter: Payer: Self-pay | Admitting: Internal Medicine

## 2020-05-21 LAB — SARS CORONAVIRUS 2 (TAT 6-24 HRS): SARS Coronavirus 2: NEGATIVE

## 2020-05-24 ENCOUNTER — Ambulatory Visit: Payer: PPO | Admitting: Anesthesiology

## 2020-05-24 ENCOUNTER — Ambulatory Visit
Admission: RE | Admit: 2020-05-24 | Discharge: 2020-05-24 | Disposition: A | Discharge status: Home / Self Care | Payer: PPO | Attending: Internal Medicine | Admitting: Internal Medicine

## 2020-05-24 ENCOUNTER — Encounter
Admission: RE | Disposition: A | Discharge status: Home / Self Care | Payer: Self-pay | Source: Home / Self Care | Attending: Internal Medicine

## 2020-05-24 ENCOUNTER — Encounter: Payer: Self-pay | Admitting: Internal Medicine

## 2020-05-24 ENCOUNTER — Other Ambulatory Visit: Payer: Self-pay

## 2020-05-24 DIAGNOSIS — K635 Polyp of colon: Secondary | ICD-10-CM | POA: Diagnosis not present

## 2020-05-24 DIAGNOSIS — I2510 Atherosclerotic heart disease of native coronary artery without angina pectoris: Secondary | ICD-10-CM | POA: Insufficient documentation

## 2020-05-24 DIAGNOSIS — K5730 Diverticulosis of large intestine without perforation or abscess without bleeding: Secondary | ICD-10-CM | POA: Insufficient documentation

## 2020-05-24 DIAGNOSIS — K649 Unspecified hemorrhoids: Secondary | ICD-10-CM | POA: Diagnosis not present

## 2020-05-24 DIAGNOSIS — K640 First degree hemorrhoids: Secondary | ICD-10-CM | POA: Diagnosis not present

## 2020-05-24 DIAGNOSIS — Z7982 Long term (current) use of aspirin: Secondary | ICD-10-CM | POA: Insufficient documentation

## 2020-05-24 DIAGNOSIS — Z800 Family history of malignant neoplasm of digestive organs: Secondary | ICD-10-CM | POA: Diagnosis not present

## 2020-05-24 DIAGNOSIS — Z1211 Encounter for screening for malignant neoplasm of colon: Secondary | ICD-10-CM | POA: Diagnosis not present

## 2020-05-24 DIAGNOSIS — Z8371 Family history of colonic polyps: Secondary | ICD-10-CM | POA: Insufficient documentation

## 2020-05-24 DIAGNOSIS — D126 Benign neoplasm of colon, unspecified: Secondary | ICD-10-CM | POA: Diagnosis not present

## 2020-05-24 DIAGNOSIS — D125 Benign neoplasm of sigmoid colon: Secondary | ICD-10-CM | POA: Diagnosis not present

## 2020-05-24 DIAGNOSIS — Z831 Family history of other infectious and parasitic diseases: Secondary | ICD-10-CM | POA: Diagnosis not present

## 2020-05-24 DIAGNOSIS — K279 Peptic ulcer, site unspecified, unspecified as acute or chronic, without hemorrhage or perforation: Secondary | ICD-10-CM | POA: Insufficient documentation

## 2020-05-24 DIAGNOSIS — Z79899 Other long term (current) drug therapy: Secondary | ICD-10-CM | POA: Diagnosis not present

## 2020-05-24 DIAGNOSIS — K219 Gastro-esophageal reflux disease without esophagitis: Secondary | ICD-10-CM | POA: Insufficient documentation

## 2020-05-24 DIAGNOSIS — E7800 Pure hypercholesterolemia, unspecified: Secondary | ICD-10-CM | POA: Insufficient documentation

## 2020-05-24 DIAGNOSIS — I10 Essential (primary) hypertension: Secondary | ICD-10-CM | POA: Diagnosis not present

## 2020-05-24 HISTORY — DX: Localized edema: R60.0

## 2020-05-24 HISTORY — DX: Postmenopausal atrophic vaginitis: N95.2

## 2020-05-24 HISTORY — DX: Psoriasis, unspecified: L40.9

## 2020-05-24 HISTORY — DX: Family history of malignant neoplasm of digestive organs: Z80.0

## 2020-05-24 HISTORY — DX: Adjustment disorder with anxiety: F43.22

## 2020-05-24 HISTORY — DX: Diaphragmatic hernia without obstruction or gangrene: K44.9

## 2020-05-24 HISTORY — DX: Dysuria: R30.0

## 2020-05-24 HISTORY — DX: Dysphagia, unspecified: R13.10

## 2020-05-24 HISTORY — DX: Urinary tract infection, site not specified: N39.0

## 2020-05-24 HISTORY — DX: Unspecified macular degeneration: H35.30

## 2020-05-24 HISTORY — DX: Other chronic pain: G89.29

## 2020-05-24 HISTORY — DX: Hyperlipidemia, unspecified: E78.5

## 2020-05-24 HISTORY — DX: Atherosclerotic heart disease of native coronary artery without angina pectoris: I25.10

## 2020-05-24 HISTORY — PX: COLONOSCOPY: SHX5424

## 2020-05-24 LAB — HM COLONOSCOPY

## 2020-05-24 SURGERY — COLONOSCOPY
Anesthesia: General

## 2020-05-24 MED ORDER — PROPOFOL 500 MG/50ML IV EMUL
INTRAVENOUS | Status: AC
  Filled 2020-05-24: qty 50

## 2020-05-24 MED ORDER — SODIUM CHLORIDE 0.9 % IV SOLN: INTRAVENOUS | Status: DC

## 2020-05-24 MED ORDER — LIDOCAINE HCL (CARDIAC) PF 100 MG/5ML IV SOSY
PREFILLED_SYRINGE | INTRAVENOUS | Status: DC | PRN
  Administered 2020-05-24: 50 mg via INTRAVENOUS

## 2020-05-24 MED ORDER — PROPOFOL 10 MG/ML IV BOLUS
INTRAVENOUS | Status: DC | PRN
  Administered 2020-05-24: 40 mg via INTRAVENOUS
  Administered 2020-05-24: 60 mg via INTRAVENOUS

## 2020-05-24 MED ORDER — PROPOFOL 500 MG/50ML IV EMUL
INTRAVENOUS | Status: DC | PRN
  Administered 2020-05-24: 150 ug/kg/min via INTRAVENOUS

## 2020-05-24 NOTE — Anesthesia Postprocedure Evaluation (Signed)
Anesthesia Post Note  Patient: TAI SYFERT  Procedure(s) Performed: COLONOSCOPY (N/A )  Patient location during evaluation: Endoscopy Anesthesia Type: General Level of consciousness: awake and alert and oriented Pain management: pain level controlled Vital Signs Assessment: post-procedure vital signs reviewed and stable Respiratory status: spontaneous breathing, nonlabored ventilation and respiratory function stable Cardiovascular status: blood pressure returned to baseline and stable Postop Assessment: no signs of nausea or vomiting Anesthetic complications: no   No complications documented.   Last Vitals:  Vitals:   05/24/20 1318 05/24/20 1440  BP: (!) 142/78 (!) 96/53  Pulse: 65 66  Resp:  18  Temp: (!) 36.4 C 36.8 C  SpO2: 99% 96%    Last Pain:  Vitals:   05/24/20 1440  TempSrc: Temporal  PainSc: Asleep                 Dewarren Ledbetter

## 2020-05-24 NOTE — Transfer of Care (Signed)
Immediate Anesthesia Transfer of Care Note  Patient: Lindsay Tran  Procedure(s) Performed: COLONOSCOPY (N/A )  Patient Location: PACU  Anesthesia Type:General  Level of Consciousness: sedated  Airway & Oxygen Therapy: Patient Spontanous Breathing  Post-op Assessment: Report given to RN and Post -op Vital signs reviewed and stable  Post vital signs: Reviewed and stable  Last Vitals:  Vitals Value Taken Time  BP 96/53 05/24/20 1440  Temp    Pulse 66 05/24/20 1440  Resp 18 05/24/20 1440  SpO2 96 % 05/24/20 1440  Vitals shown include unvalidated device data.  Last Pain:  Vitals:   05/24/20 1440  TempSrc: (P) Temporal  PainSc:          Complications: No complications documented.

## 2020-05-24 NOTE — Anesthesia Procedure Notes (Signed)
Date/Time: 05/24/2020 2:20 PM Performed by: Johnna Acosta, CRNA Pre-anesthesia Checklist: Patient identified, Emergency Drugs available, Suction available, Patient being monitored and Timeout performed Patient Re-evaluated:Patient Re-evaluated prior to induction Oxygen Delivery Method: Nasal cannula Preoxygenation: Pre-oxygenation with 100% oxygen Induction Type: IV induction

## 2020-05-24 NOTE — Anesthesia Preprocedure Evaluation (Signed)
Anesthesia Evaluation  Patient identified by MRN, date of birth, ID band Patient awake    Reviewed: Allergy & Precautions, NPO status , Patient's Chart, lab work & pertinent test results  History of Anesthesia Complications (+) PONV and history of anesthetic complications  Airway Mallampati: II  TM Distance: >3 FB Neck ROM: Full    Dental no notable dental hx.    Pulmonary neg pulmonary ROS, neg sleep apnea, neg COPD,    breath sounds clear to auscultation- rhonchi (-) wheezing      Cardiovascular hypertension, Pt. on medications (-) CAD, (-) Past MI, (-) Cardiac Stents and (-) CABG  Rhythm:Regular Rate:Normal - Systolic murmurs and - Diastolic murmurs    Neuro/Psych neg Seizures PSYCHIATRIC DISORDERS Anxiety negative neurological ROS     GI/Hepatic Neg liver ROS, hiatal hernia, PUD, GERD  ,  Endo/Other  negative endocrine ROSneg diabetes  Renal/GU negative Renal ROS     Musculoskeletal  (+) Arthritis ,   Abdominal (+) - obese,   Peds  Hematology  (+) anemia ,   Anesthesia Other Findings Past Medical History: No date: Abdominal pain No date: Adjustment disorder with anxiety No date: Anemia No date: Anxiety     Comment:  adjustment disorder with anxiety No date: Arthritis No date: Atrophic vaginitis No date: BPPV (benign paroxysmal positional vertigo) No date: CAD in native artery No date: Cataract No date: Chronic diarrhea No date: Chronic pain of right knee No date: Complication of anesthesia No date: Coronary artery disease No date: Dysphagia No date: Dysphasia No date: Dysuria No date: Family history of colon cancer 1980's: Gastric ulcer No date: Gastric ulcer No date: Gastroesophageal hernia No date: GERD (gastroesophageal reflux disease) No date: High cholesterol No date: Hyperlipidemia No date: Hypertension No date: Macular degeneration No date: Osteoporosis No date: Osteoporosis      Comment:  post-menopausal No date: Pedal edema No date: Psoriasis 05/02/2013: Schatzki's ring No date: Schatzki's ring No date: Scoliosis No date: Scoliosis No date: UTI (urinary tract infection)   Reproductive/Obstetrics                             Anesthesia Physical Anesthesia Plan  ASA: II  Anesthesia Plan: General   Post-op Pain Management:    Induction: Intravenous  PONV Risk Score and Plan: 3 and Propofol infusion  Airway Management Planned: Natural Airway  Additional Equipment:   Intra-op Plan:   Post-operative Plan:   Informed Consent: I have reviewed the patients History and Physical, chart, labs and discussed the procedure including the risks, benefits and alternatives for the proposed anesthesia with the patient or authorized representative who has indicated his/her understanding and acceptance.     Dental advisory given  Plan Discussed with: CRNA and Anesthesiologist  Anesthesia Plan Comments:         Anesthesia Quick Evaluation

## 2020-05-24 NOTE — Interval H&P Note (Signed)
History and Physical Interval Note:  05/24/2020 2:13 PM  Lindsay Tran  has presented today for surgery, with the diagnosis of FAMILY HX.OF COLON POLYPS.  The various methods of treatment have been discussed with the patient and family. After consideration of risks, benefits and other options for treatment, the patient has consented to  Procedure(s): COLONOSCOPY (N/A) as a surgical intervention.  The patient's history has been reviewed, patient examined, no change in status, stable for surgery.  I have reviewed the patient's chart and labs.  Questions were answered to the patient's satisfaction.     [name], [place]

## 2020-05-24 NOTE — H&P (Signed)
Outpatient short stay form Pre-procedure 05/24/2020 2:12 PM Teasha Murrillo K. Alice Reichert, M.D.  Primary Physician: Tommi Rumps, M.D.  Reason for visit:  Family history of colon polyps  History of present illness:                            Patient presents for colonoscopy for a family hx of colon polyps (Brother). The patient denies abdominal pain, abnormal weight loss or rectal bleeding.      Current Facility-Administered Medications:  .  0.9 %  sodium chloride infusion, , Intravenous, Continuous, [place], Benay Pike, MD, Last Rate: 20 mL/hr at 05/24/20 1331, New Bag at 05/24/20 1331  Medications Prior to Admission  Medication Sig Dispense Refill Last Dose  . conjugated estrogens (PREMARIN) vaginal cream INSERT ONE APPLICATORFUL VAGINALLY TWICE WEEKLY 90 g 2 Past Week at Unknown time  . Cyanocobalamin (VITAMIN B 12 PO) Take by mouth.   Past Week at Unknown time  . ketoconazole (NIZORAL) 2 % cream Apply 1 application topically daily as needed.    05/23/2020 at Unknown time  . metoprolol tartrate (LOPRESSOR) 100 MG tablet Take 1 tablet (100 mg total) by mouth 2 (two) times daily. 180 tablet 3 05/24/2020 at Unknown time  . mupirocin ointment (BACTROBAN) 2 % Place 1 application into the nose 2 (two) times daily. 22 g 1 Past Week at Unknown time  . Omega-3 Fatty Acids (OMEGA 3 500 PO) Take 1 capsule by mouth daily.    Past Week at Unknown time  . pantoprazole (PROTONIX) 40 MG tablet Take 1 tablet (40 mg total) by mouth daily. 90 tablet 3 05/23/2020 at Unknown time  . Probiotic Product (PROBIOTIC DAILY) CAPS Take 1 capsule by mouth.   Past Week at Unknown time  . triamcinolone cream (KENALOG) 0.1 % APPLY TOPICALLY TWICE DAILY AS NEEDED FOR RASH. AVOID FACE 240 g 0 Past Week at Unknown time  . aspirin 81 MG tablet Take 81 mg by mouth daily.     . clobetasol (OLUX) 0.05 % topical foam Apply topically 2 (two) times daily. 50 g 0   . clobetasol ointment (TEMOVATE) 0.63 % Apply 1 application topically 2  (two) times daily as needed. For rash. Avoid face. 180 g 1   . fenofibrate 160 MG tablet Take 1 tablet (160 mg total) by mouth daily. 90 tablet 3 05/22/2020  . meclizine (ANTIVERT) 25 MG tablet Take 1 tablet (25 mg total) by mouth 2 (two) times daily as needed for dizziness. 20 tablet 0   . methocarbamol (ROBAXIN) 500 MG tablet   1   . Misc Natural Products (OSTEO BI-FLEX/5-LOXIN ADVANCED) TABS Take 1 tablet by mouth 2 (two) times daily.   05/22/2020  . Multiple Vitamins-Minerals (PRESERVISION AREDS 2 PO) Take by mouth.   05/22/2020  . naproxen sodium (ANAPROX) 220 MG tablet Take 220 mg by mouth daily. Sometimes pt takes twice a day if needed from pain     . potassium chloride (K-DUR) 10 MEQ tablet Take 10 mEq by mouth daily.   05/22/2020  . simvastatin (ZOCOR) 40 MG tablet TAKE 1 TABLET BY MOUTH ONCE DAILY AT  6  PM. 90 tablet 3 05/22/2020     No Known Allergies   Past Medical History:  Diagnosis Date  . Abdominal pain   . Adjustment disorder with anxiety   . Anemia   . Anxiety    adjustment disorder with anxiety  . Arthritis   . Atrophic vaginitis   .  BPPV (benign paroxysmal positional vertigo)   . CAD in native artery   . Cataract   . Chronic diarrhea   . Chronic pain of right knee   . Complication of anesthesia   . Coronary artery disease   . Dysphagia   . Dysphasia   . Dysuria   . Family history of colon cancer   . Gastric ulcer 1980's  . Gastric ulcer   . Gastroesophageal hernia   . GERD (gastroesophageal reflux disease)   . High cholesterol   . Hyperlipidemia   . Hypertension   . Macular degeneration   . Osteoporosis   . Osteoporosis    post-menopausal  . Pedal edema   . Psoriasis   . Schatzki's ring 05/02/2013  . Schatzki's ring   . Scoliosis   . Scoliosis   . UTI (urinary tract infection)     Review of systems:  Otherwise negative.    Physical Exam  Gen: Alert, oriented. Appears stated age.  HEENT: [place]/AT. PERRLA. Lungs: CTA, no wheezes. CV: RR  nl S1, S2. Abd: soft, benign, no masses. BS+ Ext: No edema. Pulses 2+    Planned procedures: Proceed with colonoscopy. The patient understands the nature of the planned procedure, indications, risks, alternatives and potential complications including but not limited to bleeding, infection, perforation, damage to internal organs and possible oversedation/side effects from anesthesia. The patient agrees and gives consent to proceed.  Please refer to procedure notes for findings, recommendations and patient disposition/instructions.     Makaylin Carlo K. Alice Reichert, M.D. Gastroenterology 05/24/2020  2:12 PM

## 2020-05-24 NOTE — Op Note (Signed)
[facility] Gastroenterology Patient Name: Lindsay Tran Procedure Date: 05/24/2020 2:11 PM MRN: [id number] Account #: [id number] Date of Birth: 1945-09-30 Admit Type: Outpatient Age: 74 Room: [facility] ENDO ROOM 2 Gender: Female Note Status: Finalized Procedure:             Colonoscopy Indications:           Colon cancer screening in patient at increased risk:                         Family history of 1st-degree relative with colon polyps Providers:             Benay Pike. Alice Reichert MD, MD Referring MD:          Angela Adam. Caryl Bis (Referring MD) Medicines:             Propofol per Anesthesia Complications:         No immediate complications. Procedure:             Pre-Anesthesia Assessment:                        - The risks and benefits of the procedure and the                         sedation options and risks were discussed with the                         patient. All questions were answered and informed                         consent was obtained.                        - Patient identification and proposed procedure were                         verified prior to the procedure by the nurse. The                         procedure was verified in the procedure room.                        - ASA Grade Assessment: III - A patient with severe                         systemic disease.                        - After reviewing the risks and benefits, the patient                         was deemed in satisfactory condition to undergo the                         procedure.                        After obtaining informed consent, the colonoscope was                         passed  under direct vision. Throughout the procedure,                         the patient's blood pressure, pulse, and oxygen                         saturations were monitored continuously. The                         Colonoscope was introduced through the anus and                         advanced to the  the cecum, identified by appendiceal                         orifice and ileocecal valve. The colonoscopy was                         performed without difficulty. The patient tolerated                         the procedure well. The quality of the bowel                         preparation was good. The ileocecal valve, appendiceal                         orifice, and rectum were photographed. Findings:      The perianal and digital rectal examinations were normal. Pertinent       negatives include normal sphincter tone and no palpable rectal lesions.      Non-bleeding internal hemorrhoids were found during retroflexion. The       hemorrhoids were Grade I (internal hemorrhoids that do not prolapse).      A few small-mouthed diverticula were found in the sigmoid colon.      A 4 mm polyp was found in the proximal sigmoid colon. The polyp was       sessile. The polyp was removed with a jumbo cold forceps. Resection and       retrieval were complete.      The exam was otherwise without abnormality. Impression:            - Non-bleeding internal hemorrhoids.                        - Diverticulosis in the sigmoid colon.                        - One 4 mm polyp in the proximal sigmoid colon,                         removed with a jumbo cold forceps. Resected and                         retrieved.                        - The examination was otherwise normal. Recommendation:        - Patient has a contact number available for  emergencies. The signs and symptoms of potential                         delayed complications were discussed with the patient.                         Return to normal activities tomorrow. Written                         discharge instructions were provided to the patient.                        - Resume previous diet.                        - Continue present medications.                        - If polyps are benign or adenomatous without                          dysplasia, I will advise NO further colonoscopy due to                         advanced age and/or severe comorbidity.                        - Return to GI office PRN.                        - The findings and recommendations were discussed with                         the patient. Procedure Code(s):     --- Professional ---                        [id number], Colonoscopy, flexible; with biopsy, single or                         multiple Diagnosis Code(s):     --- Professional ---                        K57.30, Diverticulosis of large intestine without                         perforation or abscess without bleeding                        K63.5, Polyp of colon                        K64.0, First degree hemorrhoids                        Z83.71, Family history of colonic polyps CPT copyright 2019 [employer]. All rights reserved. The codes documented in this report are preliminary and upon coder review may  be revised to meet current compliance requirements. Efrain Sella MD, MD 05/24/2020 2:39:18 PM This report has been signed electronically. Number of Addenda: 0 Note Initiated On: 05/24/2020 2:11  PM Scope Withdrawal Time: 0 hours 6 minutes 19 seconds  Total Procedure Duration: 0 hours 10 minutes 42 seconds  Estimated Blood Loss:  Estimated blood loss: none.      [facility]

## 2020-05-26 LAB — SURGICAL PATHOLOGY

## 2020-06-21 ENCOUNTER — Encounter: Payer: Self-pay | Admitting: Family Medicine

## 2020-06-21 ENCOUNTER — Other Ambulatory Visit: Payer: Self-pay

## 2020-06-21 ENCOUNTER — Ambulatory Visit (INDEPENDENT_AMBULATORY_CARE_PROVIDER_SITE_OTHER): Payer: PPO | Admitting: Family Medicine

## 2020-06-21 VITALS — BP 130/70 | HR 61 | Temp 97.5°F | Ht 60.0 in | Wt 138.4 lb

## 2020-06-21 DIAGNOSIS — G250 Essential tremor: Secondary | ICD-10-CM | POA: Diagnosis not present

## 2020-06-21 DIAGNOSIS — L409 Psoriasis, unspecified: Secondary | ICD-10-CM | POA: Diagnosis not present

## 2020-06-21 DIAGNOSIS — E785 Hyperlipidemia, unspecified: Secondary | ICD-10-CM

## 2020-06-21 DIAGNOSIS — K219 Gastro-esophageal reflux disease without esophagitis: Secondary | ICD-10-CM | POA: Diagnosis not present

## 2020-06-21 DIAGNOSIS — N952 Postmenopausal atrophic vaginitis: Secondary | ICD-10-CM | POA: Diagnosis not present

## 2020-06-21 DIAGNOSIS — I10 Essential (primary) hypertension: Secondary | ICD-10-CM

## 2020-06-21 DIAGNOSIS — R7303 Prediabetes: Secondary | ICD-10-CM | POA: Diagnosis not present

## 2020-06-21 DIAGNOSIS — H938X1 Other specified disorders of right ear: Secondary | ICD-10-CM | POA: Diagnosis not present

## 2020-06-21 DIAGNOSIS — H938X9 Other specified disorders of ear, unspecified ear: Secondary | ICD-10-CM | POA: Insufficient documentation

## 2020-06-21 MED ORDER — PREMARIN 0.625 MG/GM VA CREA
TOPICAL_CREAM | VAGINAL | 2 refills | Status: DC
Start: 2020-06-21 — End: 2020-11-15

## 2020-06-21 MED ORDER — CLOBETASOL PROPIONATE 0.05 % EX OINT: 1.0000 "application " | TOPICAL_OINTMENT | Freq: Two times a day (BID) | CUTANEOUS | 1 refills | Status: DC | PRN

## 2020-06-21 NOTE — Assessment & Plan Note (Signed)
We will have her discontinue the fenofibrate and will plan on rechecking a lipid panel in one month. Continue simvastatin 40 mg daily.

## 2020-06-21 NOTE — Assessment & Plan Note (Signed)
Well controlled. Continue premarin twice weekly.

## 2020-06-21 NOTE — Patient Instructions (Signed)
Nice to see you.  Please stop the fenofibrate.  We will check labs in one month.

## 2020-06-21 NOTE — Assessment & Plan Note (Signed)
Well controlled on metoprolol.

## 2020-06-21 NOTE — Assessment & Plan Note (Signed)
Benign exam. Notes no symptoms currently. Plan to monitor.

## 2020-06-21 NOTE — Assessment & Plan Note (Signed)
Adequately controlled. Clobetasol refilled.

## 2020-06-21 NOTE — Assessment & Plan Note (Signed)
Stable. Continue protonix 40 mg daily 

## 2020-06-21 NOTE — Progress Notes (Signed)
Tommi Rumps, MD Phone: [phone]  Lindsay Tran is a 74 y.o. female who presents today for f/u.  HYPERTENSION  Disease Monitoring  Home BP Monitoring 120s/70s Chest pain- no    Dyspnea- no Medications  Compliance-  Taking metoprolol 100 mg BID.  Edema- rare, much improved from previously, wears compression stockings  Essential tremor: metoprolol controls this relatively well. Minimal symptoms.  Atrophic vaginitis: premarin helps significantly. She can tell when it is the time to use it due to feeling irritated and raw. No vaginal bleeding.   Psoriasis: scattered rash on back, upper arms, and chest. Clobetasol works well. She is not interested in systemic therapy for this.   HLD: notes he pharmacy wondered if she should still be on fenofibrate. She also takes simvastatin. Occasional muscle aches.   GERD:   Reflux symptoms: no    Dysphagia: none recently   EGD: 2018 for stenosis  Medication: protonix Recently saw GI and they discussed barium swallow.   Right ear fullness: patient feels that there may be some wax in her ear. She notes some occasional itching in her ear. The fullness comes and goes.    Social History   Tobacco Use  Smoking Status Never Smoker  Smokeless Tobacco Never Used     ROS see history of present illness  Objective  Physical Exam Vitals:   06/21/20 1039  BP: 130/70  Pulse: 61  Temp: (!) 97.5 F (36.4 C)  SpO2: 99%    BP Readings from Last 3 Encounters:  06/21/20 130/70  05/24/20 (!) 96/53  12/17/19 140/80   Wt Readings from Last 3 Encounters:  06/21/20 138 lb 6.4 oz (62.8 kg)  05/24/20 130 lb (59 kg)  01/16/20 134 lb (60.8 kg)    Physical Exam Constitutional:      General: She is not in acute distress.    Appearance: She is not diaphoretic.  HENT:     Right Ear: Tympanic membrane and ear canal normal.     Left Ear: Tympanic membrane, ear canal and external ear normal.  Cardiovascular:     Rate and Rhythm: Normal rate  and regular rhythm.     Heart sounds: Normal heart sounds.  Pulmonary:     Effort: Pulmonary effort is normal.     Breath sounds: Normal breath sounds.  Musculoskeletal:        General: No edema.  Skin:    General: Skin is warm and dry.     Comments: Scattered faint rash on her upper back, plaque like rash  Neurological:     Mental Status: She is alert.      Assessment/Plan: Please see individual problem list.  Problem List Items Addressed This Visit    Acid reflux    Stable. Continue protonix 40 mg daily.       Atrophic vaginitis    Well controlled. Continue premarin twice weekly.       Benign essential HTN    Well controlled. Continue metoprolol 100 mg BID.       Ear fullness    Benign exam. Notes no symptoms currently. Plan to monitor.       Essential tremor    Well controlled on metoprolol.       Hyperlipidemia - Primary    We will have her discontinue the fenofibrate and will plan on rechecking a lipid panel in one month. Continue simvastatin 40 mg daily.       Relevant Orders   Lipid panel   Psoriasis  Adequately controlled. Clobetasol refilled.        Other Visit Diagnoses    Prediabetes       Relevant Orders   Hemoglobin A1c      This visit occurred during the SARS-CoV-2 public health emergency.  Safety protocols were in place, including screening questions prior to the visit, additional usage of staff PPE, and extensive cleaning of exam room while observing appropriate contact time as indicated for disinfecting solutions.    Tommi Rumps, MD [place]

## 2020-06-21 NOTE — Assessment & Plan Note (Signed)
Well controlled. Continue metoprolol 100 mg BID.

## 2020-06-30 DIAGNOSIS — H353131 Nonexudative age-related macular degeneration, bilateral, early dry stage: Secondary | ICD-10-CM | POA: Diagnosis not present

## 2020-07-22 ENCOUNTER — Other Ambulatory Visit (INDEPENDENT_AMBULATORY_CARE_PROVIDER_SITE_OTHER): Payer: PPO

## 2020-07-22 ENCOUNTER — Other Ambulatory Visit: Payer: Self-pay

## 2020-07-22 DIAGNOSIS — R7303 Prediabetes: Secondary | ICD-10-CM

## 2020-07-22 DIAGNOSIS — E785 Hyperlipidemia, unspecified: Secondary | ICD-10-CM | POA: Diagnosis not present

## 2020-07-22 LAB — LIPID PANEL
Cholesterol: 126 mg/dL (ref 0–200)
HDL: 63.3 mg/dL (ref >39.00)
LDL Cholesterol: 46 mg/dL (ref 0–99)
NonHDL: 62.54
Total CHOL/HDL Ratio: 2
Triglycerides: 83 mg/dL (ref 0.0–149.0)
VLDL: 16.6 mg/dL (ref 0.0–40.0)

## 2020-07-22 LAB — HEMOGLOBIN A1C: Hgb A1c MFr Bld: 5.4 % (ref 4.6–6.5)

## 2020-11-12 ENCOUNTER — Other Ambulatory Visit: Payer: Self-pay | Admitting: Family Medicine

## 2020-11-30 DIAGNOSIS — M1712 Unilateral primary osteoarthritis, left knee: Secondary | ICD-10-CM | POA: Diagnosis not present

## 2020-12-20 ENCOUNTER — Other Ambulatory Visit: Payer: Self-pay

## 2020-12-20 ENCOUNTER — Encounter: Payer: Self-pay | Admitting: Family Medicine

## 2020-12-20 ENCOUNTER — Other Ambulatory Visit: Payer: Self-pay | Admitting: Family Medicine

## 2020-12-20 ENCOUNTER — Ambulatory Visit (INDEPENDENT_AMBULATORY_CARE_PROVIDER_SITE_OTHER): Payer: PPO | Admitting: Family Medicine

## 2020-12-20 DIAGNOSIS — R3915 Urgency of urination: Secondary | ICD-10-CM | POA: Diagnosis not present

## 2020-12-20 DIAGNOSIS — I10 Essential (primary) hypertension: Secondary | ICD-10-CM

## 2020-12-20 DIAGNOSIS — L409 Psoriasis, unspecified: Secondary | ICD-10-CM | POA: Diagnosis not present

## 2020-12-20 DIAGNOSIS — R143 Flatulence: Secondary | ICD-10-CM | POA: Diagnosis not present

## 2020-12-20 DIAGNOSIS — M1990 Unspecified osteoarthritis, unspecified site: Secondary | ICD-10-CM

## 2020-12-20 DIAGNOSIS — E611 Iron deficiency: Secondary | ICD-10-CM | POA: Diagnosis not present

## 2020-12-20 DIAGNOSIS — N760 Acute vaginitis: Secondary | ICD-10-CM

## 2020-12-20 LAB — COMPREHENSIVE METABOLIC PANEL
ALT: 12 U/L (ref 0–35)
AST: 21 U/L (ref 0–37)
Albumin: 3.9 g/dL (ref 3.5–5.2)
Alkaline Phosphatase: 60 U/L (ref 39–117)
BUN: 27 mg/dL — ABNORMAL HIGH (ref 6–23)
CO2: 26 mEq/L (ref 19–32)
Calcium: 9 mg/dL (ref 8.4–10.5)
Chloride: 100 mEq/L (ref 96–112)
Creatinine, Ser: 0.83 mg/dL (ref 0.40–1.20)
GFR: 68.03 mL/min (ref >60.00)
Glucose, Bld: 86 mg/dL (ref 70–99)
Potassium: 4.6 mEq/L (ref 3.5–5.1)
Sodium: 135 mEq/L (ref 135–145)
Total Bilirubin: 0.6 mg/dL (ref 0.2–1.2)
Total Protein: 6.4 g/dL (ref 6.0–8.3)

## 2020-12-20 LAB — CBC
HCT: 41 % (ref 36.0–46.0)
Hemoglobin: 13.4 g/dL (ref 12.0–15.0)
MCHC: 32.7 g/dL (ref 30.0–36.0)
MCV: 84.8 fl (ref 78.0–100.0)
Platelets: 238 10*3/uL (ref 150.0–400.0)
RBC: 4.83 Mil/uL (ref 3.87–5.11)
RDW: 15.9 % — ABNORMAL HIGH (ref 11.5–15.5)
WBC: 5.3 10*3/uL (ref 4.0–10.5)

## 2020-12-20 LAB — IBC + FERRITIN
Ferritin: 14.5 ng/mL (ref 10.0–291.0)
Iron: 71 ug/dL (ref 42–145)
Saturation Ratios: 16.6 % — ABNORMAL LOW (ref 20.0–50.0)
Transferrin: 306 mg/dL (ref 212.0–360.0)

## 2020-12-20 LAB — POCT URINALYSIS DIPSTICK
Bilirubin, UA: NEGATIVE
Blood, UA: NEGATIVE
Glucose, UA: NEGATIVE
Ketones, UA: NEGATIVE
Leukocytes, UA: NEGATIVE
Nitrite, UA: NEGATIVE
Protein, UA: NEGATIVE
Spec Grav, UA: 1.01
Urobilinogen, UA: 0.2 U/dL
pH, UA: 5.5

## 2020-12-20 MED ORDER — FLUCONAZOLE 150 MG PO TABS: 150.0000 mg | ORAL_TABLET | ORAL | 0 refills | Status: AC

## 2020-12-20 NOTE — Assessment & Plan Note (Signed)
I suspect yeast vaginitis based on her recent antibiotic exposure.  We will treat with empiric Diflucan.  If she is not improving she will let us know.

## 2020-12-20 NOTE — Assessment & Plan Note (Signed)
Question of whether or not the patient has osteoarthritis versus psoriatic arthritis.  We will refer her to rheumatology to get their input.  Discussed that she needs to remain on Protonix if she is going to take Aleve and she should take the Aleve with food.  If she develops any abdominal pain she needs to let us know right away.

## 2020-12-20 NOTE — Assessment & Plan Note (Signed)
Adequate control at home.  She will continue to monitor.  If it trends up we will follow-up 140s over 80s she will let us know.  She will continue metoprolol 100 mg twice daily.

## 2020-12-20 NOTE — Assessment & Plan Note (Addendum)
Possibly related to her vaginitis though we will check a urinalysis.

## 2020-12-20 NOTE — Assessment & Plan Note (Signed)
I suspect dietary mediated gas or related to bacterial overgrowth.  Her symptoms have improved at this point.  I encouraged her to continue to monitor diet and to monitor for recurrence of symptoms.  Discussed that H. pylori should not cause this type of symptom and thus I do not feel that testing for this at this time is necessary.

## 2020-12-20 NOTE — Progress Notes (Addendum)
Marikay Alar, MD Phone: [phone]  Lindsay Tran is a 75 y.o. female who presents today for f/u.  HYPERTENSION Disease Monitoring Home BP Monitoring 130/70s  Chest pain- no    Dyspnea- no Medications Compliance-  taking metoprolol 100 mg BID.   Edema- chronic and stable  Arthritis: Patient notes she has worse discomfort in her knees, shoulders, hands.  She is seeing orthopedics for her knees and they wanted to do an injection.  She has started back with the lead notes some benefit from this.  She also notes her psoriasis seems to get worse when her joints get worse.  She wonders about starting on something to treat her psoriasis and her arthritis.  Excessive flatulence: Patient notes stomach problems for greater than 4 months.  She notes recently she was treated with 2 courses of antibiotics for a dental infection and the excessive gas has improved over the last 3 to 4 weeks after completing her antibiotic therapy.  She had cramping with this.  No epigastric pain.  She notes the smell to the flatulence was strange.  She noted constipation to start with though that resolved.  No diarrhea.  No nausea.  No vomiting.  She does take a probiotic.  She does follow a FODMAP diet.  She eliminated turmeric as she noted that was the only new thing that she had started.  She wondered if this was related to H. pylori.  Vaginal itching: Patient reports this started recently after finishing her antibiotic.  She notes some burning as well.  She does note some urinary urgency and this morning she had some discomfort in her bladder area though no dysuria.  No urinary frequency.  Notes no vaginal discharge.  No blood in her urine or vaginal bleeding.   Social History   Tobacco Use  Smoking Status Never  Smokeless Tobacco Never    Current Outpatient Medications on File Prior to Visit  Medication Sig Dispense Refill   clobetasol (OLUX) 0.05 % topical foam Apply topically 2 (two) times daily. 50 g 0    clobetasol ointment (TEMOVATE) 0.05 % Apply 1 application topically 2 (two) times daily as needed. For rash. Avoid face. 180 g 1   Cyanocobalamin (VITAMIN B 12 PO) Take by mouth.     ketoconazole (NIZORAL) 2 % cream Apply 1 application topically daily as needed.      Multiple Vitamins-Minerals (PRESERVISION AREDS 2 PO) Take by mouth.     mupirocin ointment (BACTROBAN) 2 % Place 1 application into the nose 2 (two) times daily. 22 g 1   Omega-3 Fatty Acids (OMEGA 3 500 PO) Take 1 capsule by mouth daily.      potassium chloride (K-DUR) 10 MEQ tablet Take 10 mEq by mouth daily.     PREMARIN vaginal cream INSERT ONE APPLICATORFUL VAGINALLY TWICE WEEKLY 90 g 2   Probiotic Product (PROBIOTIC DAILY) CAPS Take 1 capsule by mouth.     No current facility-administered medications on file prior to visit.     ROS see history of present illness  Objective  Physical Exam Vitals:   12/20/20 1047 12/20/20 1126  BP: 140/80 (!) 156/80  Pulse: (!) 57   Temp: 98 F (36.7 C)   SpO2: 95%     BP Readings from Last 3 Encounters:  12/20/20 (!) 156/80  06/21/20 130/70  05/24/20 (!) 96/53   Wt Readings from Last 3 Encounters:  12/20/20 141 lb 12.8 oz (64.3 kg)  06/21/20 138 lb 6.4 oz (62.8 kg)  05/24/20 130 lb (59 kg)    Physical Exam Constitutional:      General: She is not in acute distress.    Appearance: She is not diaphoretic.  Cardiovascular:     Rate and Rhythm: Normal rate and regular rhythm.     Heart sounds: Normal heart sounds.  Pulmonary:     Effort: Pulmonary effort is normal.     Breath sounds: Normal breath sounds.  Abdominal:     General: Bowel sounds are normal. There is no distension.     Palpations: Abdomen is soft.     Tenderness: There is no abdominal tenderness. There is no guarding or rebound.  Skin:    General: Skin is warm and dry.  Neurological:     Mental Status: She is alert.     Assessment/Plan: Please see individual problem list.  Problem List Items  Addressed This Visit     Benign essential HTN    Adequate control at home.  She will continue to monitor.  If it trends up we will follow-up 140s over 80s she will let us know.  She will continue metoprolol 100 mg twice daily.       Relevant Orders   Comp Met (CMET) (Completed)   Psoriasis    The patient can continue clobetasol and triamcinolone topically.  We are referring her to rheumatology to get their input on her arthritis and whether or not psoriatic arthritis could be contributing.       Relevant Orders   Ambulatory referral to Rheumatology   Iron deficiency    Recheck iron levels.  Prior values were acceptable.       Relevant Orders   IBC + Ferritin (Completed)   CBC (Completed)   Arthritis    Question of whether or not the patient has osteoarthritis versus psoriatic arthritis.  We will refer her to rheumatology to get their input.  Discussed that she needs to remain on Protonix if she is going to take Aleve and she should take the Aleve with food.  If she develops any abdominal pain she needs to let us know right away.       Relevant Orders   Ambulatory referral to Rheumatology   Excessive gas    I suspect dietary mediated gas or related to bacterial overgrowth.  Her symptoms have improved at this point.  I encouraged her to continue to monitor diet and to monitor for recurrence of symptoms.  Discussed that H. pylori should not cause this type of symptom and thus I do not feel that testing for this at this time is necessary.       Vaginitis    I suspect yeast vaginitis based on her recent antibiotic exposure.  We will treat with empiric Diflucan.  If she is not improving she will let us know.       Relevant Medications   fluconazole (DIFLUCAN) 150 MG tablet   Urinary urgency    Possibly related to her vaginitis though we will check a urinalysis.       Relevant Orders   POCT Urinalysis Dipstick (Completed)    Return in about 6 months (around 06/21/2021)  for Hypertension.  This visit occurred during the SARS-CoV-2 public health emergency.  Safety protocols were in place, including screening questions prior to the visit, additional usage of staff PPE, and extensive cleaning of exam room while observing appropriate contact time as indicated for disinfecting solutions.   I have spent 45 minutes in the care of this  patient regarding history taking, documentation, discussion of plan, review of up-to-date regarding symptoms of H. pylori, completion of physical exam, placing orders.   Tommi Rumps, MD [place]

## 2020-12-20 NOTE — Patient Instructions (Signed)
Nice to see you. We will check lab work today. Please continue to monitor your blood pressure.  If it trends up over the 140s over 80s please let us know. If your gas returns please let us know. I referred you to a rheumatologist.

## 2020-12-20 NOTE — Assessment & Plan Note (Signed)
Recheck iron levels.  Prior values were acceptable.

## 2020-12-20 NOTE — Assessment & Plan Note (Signed)
The patient can continue clobetasol and triamcinolone topically.  We are referring her to rheumatology to get their input on her arthritis and whether or not psoriatic arthritis could be contributing.

## 2020-12-23 ENCOUNTER — Telehealth: Payer: Self-pay | Admitting: Family Medicine

## 2020-12-23 ENCOUNTER — Telehealth: Payer: Self-pay

## 2020-12-23 ENCOUNTER — Other Ambulatory Visit: Payer: Self-pay

## 2020-12-23 DIAGNOSIS — L409 Psoriasis, unspecified: Secondary | ICD-10-CM

## 2020-12-23 MED ORDER — TRIAMCINOLONE ACETONIDE 0.1 % EX OINT: 1.0000 "application " | TOPICAL_OINTMENT | Freq: Two times a day (BID) | CUTANEOUS | 0 refills | Status: DC

## 2020-12-23 NOTE — Telephone Encounter (Signed)
[facility] on [address] called and would like to change patient's triamcinolone cream (KENALOG) 0.1 % to a ointment.

## 2020-12-24 DIAGNOSIS — M170 Bilateral primary osteoarthritis of knee: Secondary | ICD-10-CM | POA: Diagnosis not present

## 2020-12-24 DIAGNOSIS — M1711 Unilateral primary osteoarthritis, right knee: Secondary | ICD-10-CM | POA: Diagnosis not present

## 2020-12-24 DIAGNOSIS — M1712 Unilateral primary osteoarthritis, left knee: Secondary | ICD-10-CM | POA: Diagnosis not present

## 2020-12-24 MED ORDER — TRIAMCINOLONE ACETONIDE 0.1 % EX OINT: 1.0000 "application " | TOPICAL_OINTMENT | Freq: Two times a day (BID) | CUTANEOUS | 1 refills | Status: DC

## 2020-12-24 NOTE — Telephone Encounter (Signed)
PT called to advise that she has been trying for awhile to get her triamcinolone cream (KENALOG) 0.1 % to a ointment. PT would like for future references for the cream to be called in as a 3 month supply that way she can get 1 free through her insurance instead of just getting 2 tubes. PT states that if they can can they go ahead and call it in so the Rx can hold it for next time they need a refill.

## 2020-12-24 NOTE — Telephone Encounter (Signed)
Patient called back and I asked if she was having any vaginal itching and she stated she did and I informed her that the provider wanted to see her and she is scheduled for next week.  Also patient is waiting on a referral for iron fusions and she asked could she take OVC iron pills until her appointment and I informed her that would be okay to do.  Prerna Harold,cma

## 2020-12-24 NOTE — Addendum Note (Signed)
Addended by: Caryl Bis Cainen Burnham G on: 12/24/2020 12:43 PM   Modules accepted: Orders

## 2020-12-24 NOTE — Telephone Encounter (Signed)
Noted. She will need a follow-up as we will need to complete an exam if she has continued to have vaginal itching. Please get more details on her persistent symptoms.

## 2020-12-24 NOTE — Telephone Encounter (Signed)
Sent to the pharmacy

## 2020-12-26 ENCOUNTER — Other Ambulatory Visit: Payer: Self-pay | Admitting: Family Medicine

## 2020-12-26 DIAGNOSIS — E611 Iron deficiency: Secondary | ICD-10-CM

## 2020-12-29 ENCOUNTER — Ambulatory Visit: Payer: PPO | Admitting: Family Medicine

## 2020-12-30 DIAGNOSIS — M7541 Impingement syndrome of right shoulder: Secondary | ICD-10-CM | POA: Diagnosis not present

## 2020-12-30 DIAGNOSIS — M1712 Unilateral primary osteoarthritis, left knee: Secondary | ICD-10-CM | POA: Diagnosis not present

## 2020-12-30 DIAGNOSIS — M1711 Unilateral primary osteoarthritis, right knee: Secondary | ICD-10-CM | POA: Diagnosis not present

## 2020-12-30 DIAGNOSIS — M19019 Primary osteoarthritis, unspecified shoulder: Secondary | ICD-10-CM | POA: Diagnosis not present

## 2021-01-04 ENCOUNTER — Other Ambulatory Visit: Payer: Self-pay

## 2021-01-04 ENCOUNTER — Inpatient Hospital Stay: Payer: PPO

## 2021-01-04 ENCOUNTER — Inpatient Hospital Stay: Payer: PPO | Attending: Oncology | Admitting: Oncology

## 2021-01-04 ENCOUNTER — Other Ambulatory Visit: Payer: Self-pay | Admitting: Family Medicine

## 2021-01-04 VITALS — BP 145/85 | HR 54 | Temp 97.8°F | Resp 20 | Wt 139.9 lb

## 2021-01-04 DIAGNOSIS — E611 Iron deficiency: Secondary | ICD-10-CM

## 2021-01-04 DIAGNOSIS — L409 Psoriasis, unspecified: Secondary | ICD-10-CM

## 2021-01-04 DIAGNOSIS — I10 Essential (primary) hypertension: Secondary | ICD-10-CM | POA: Diagnosis not present

## 2021-01-04 DIAGNOSIS — E7800 Pure hypercholesterolemia, unspecified: Secondary | ICD-10-CM | POA: Diagnosis not present

## 2021-01-04 DIAGNOSIS — Z79899 Other long term (current) drug therapy: Secondary | ICD-10-CM | POA: Insufficient documentation

## 2021-01-04 DIAGNOSIS — D509 Iron deficiency anemia, unspecified: Secondary | ICD-10-CM | POA: Diagnosis not present

## 2021-01-04 DIAGNOSIS — I2510 Atherosclerotic heart disease of native coronary artery without angina pectoris: Secondary | ICD-10-CM | POA: Insufficient documentation

## 2021-01-06 DIAGNOSIS — M1712 Unilateral primary osteoarthritis, left knee: Secondary | ICD-10-CM | POA: Diagnosis not present

## 2021-01-07 ENCOUNTER — Encounter: Payer: Self-pay | Admitting: Oncology

## 2021-01-07 NOTE — Progress Notes (Signed)
Hematology/Oncology Consult note [facility]  [phone] [phone]  Patient Care Team: Leone Haven, MD as PCP - General (Family Medicine)   Name of the patient: Lindsay Tran  [id number]  1946-03-03   Date of visit: 01/07/21  Diagnosis-iron deficiency anemia  Chief complaint/ Reason for visit- seen in the past for thrombocytosis now referred for anemia   Heme/Onc history: Patient is a 75 year old female who was seen in the past for thrombocytosis which subsequently resolved.  Presently she has been referred to [place] for iron deficiency anemia.  Most recent CBC from 12/20/2020 Showed white count of 5.3, H&H of 13.4/41 with a platelet count of 238.  Ferritin was low at 14.5 with an iron saturation of 16.6.  Patient reports having significant fatigue.  Denies any consistent use of NSAIDs.  Denies any blood loss in her stool or urine.  She has undergone colonoscopy with Dr. Alice Reichert in mid November 2021 which showed nonbleeding internal hemorrhoids and diverticulosis in the sigmoid colon.  Last endoscopy was in 2018 which showed evidence of gastritis but otherwise normal esophagus.  Interval history-patient reports ongoing fatigue.  Denies any blood loss in stool or urine.  ECOG PS- 1 Pain scale- 0  Review of systems- Review of Systems  Constitutional:  Positive for malaise/fatigue. Negative for chills, fever and weight loss.  HENT:  Negative for congestion, ear discharge and nosebleeds.   Eyes:  Negative for blurred vision.  Respiratory:  Negative for cough, hemoptysis, sputum production, shortness of breath and wheezing.   Cardiovascular:  Negative for chest pain, palpitations, orthopnea and claudication.  Gastrointestinal:  Negative for abdominal pain, blood in stool, constipation, diarrhea, heartburn, melena, nausea and vomiting.  Genitourinary:  Negative for dysuria, flank pain, frequency, hematuria and urgency.  Musculoskeletal:   Negative for back pain, joint pain and myalgias.  Skin:  Negative for rash.  Neurological:  Negative for dizziness, tingling, focal weakness, seizures, weakness and headaches.  Endo/Heme/Allergies:  Does not bruise/bleed easily.  Psychiatric/Behavioral:  Negative for depression and suicidal ideas. The patient does not have insomnia.      Allergies  Allergen Reactions   Gluten Meal Rash     Past Medical History:  Diagnosis Date   Abdominal pain    Adjustment disorder with anxiety    Anemia    Anxiety    adjustment disorder with anxiety   Arthritis    Atrophic vaginitis    BPPV (benign paroxysmal positional vertigo)    CAD in native artery    Cataract    Chronic diarrhea    Chronic pain of right knee    Complication of anesthesia    Coronary artery disease    Dysphagia    Dysphasia    Dysuria    Family history of colon cancer    Gastric ulcer 1980's   Gastric ulcer    Gastroesophageal hernia    GERD (gastroesophageal reflux disease)    High cholesterol    Hyperlipidemia    Hypertension    Macular degeneration    Osteoporosis    Osteoporosis    post-menopausal   Pedal edema    Psoriasis    Schatzki's ring 05/02/2013   Schatzki's ring    Scoliosis    Scoliosis    UTI (urinary tract infection)      Past Surgical History:  Procedure Laterality Date   ABDOMINAL HYSTERECTOMY     CARDIAC CATHETERIZATION     CHOLECYSTECTOMY     COLONOSCOPY  COLONOSCOPY N/A 11/12/2014   Procedure: COLONOSCOPY;  Surgeon: Manya Silvas, MD;  Location: [facility] ENDOSCOPY;  Service: Endoscopy;  Laterality: N/A;   COLONOSCOPY N/A 05/24/2020   Procedure: COLONOSCOPY;  Surgeon: Toledo, Benay Pike, MD;  Location: [facility] ENDOSCOPY;  Service: Endoscopy;  Laterality: N/A;   DIAGNOSTIC LAPAROSCOPY     paraesphogeal hernia   ESOPHAGOGASTRODUODENOSCOPY     ESOPHAGOGASTRODUODENOSCOPY (EGD) WITH PROPOFOL N/A 04/16/2017   Procedure: ESOPHAGOGASTRODUODENOSCOPY (EGD) WITH PROPOFOL;  Surgeon:  Manya Silvas, MD;  Location: [facility] ENDOSCOPY;  Service: Endoscopy;  Laterality: N/A;   HERNIA REPAIR     inguinal hernia   HERNIA REPAIR     umbilical hernia    Social History   Socioeconomic History   Marital status: Single    Spouse name: Not on file   Number of children: Not on file   Years of education: Not on file   Highest education level: Not on file  Occupational History   Not on file  Tobacco Use   Smoking status: Never   Smokeless tobacco: Never  Vaping Use   Vaping Use: Never used  Substance and Sexual Activity   Alcohol use: No    Alcohol/week: 0.0 standard drinks   Drug use: No   Sexual activity: Never  Other Topics Concern   Not on file  Social History Narrative   Divorced   Retired as a Scientist, research (medical)   12 + years of school    Social Determinants of Radio broadcast assistant Strain: Not on Art therapist Insecurity: Not on file  Transportation Needs: Not on file  Physical Activity: Not on file  Stress: Not on file  Social Connections: Unknown   Frequency of Communication with Friends and Family: More than three times a week   Frequency of Social Gatherings with Friends and Family: More than three times a week   Attends Religious Services: Not on Electrical engineer or Organizations: Not on file   Attends Archivist Meetings: Not on file   Marital Status: Not on file  Intimate Partner Violence: Not At Risk   Fear of Current or Ex-Partner: No   Emotionally Abused: No   Physically Abused: No   Sexually Abused: No    Family History  Problem Relation Age of Onset   Diabetes Mother    Parkinson's disease Mother    Cancer Father        Pancreatic   Cancer Sister        Lung   Diabetes Sister    Alzheimer's disease Paternal Aunt        x2 sisters   Diabetes Sister    Alzheimer's disease Sister    Tics Sister    Breast cancer Neg Hx      Current Outpatient Medications:    clobetasol (OLUX) 0.05 % topical foam, Apply  topically 2 (two) times daily., Disp: 50 g, Rfl: 0   clobetasol ointment (TEMOVATE) 5.73 %, Apply 1 application topically 2 (two) times daily as needed. For rash. Avoid face., Disp: 180 g, Rfl: 1   Cyanocobalamin (VITAMIN B 12 PO), Take by mouth., Disp: , Rfl:    metoprolol tartrate (LOPRESSOR) 100 MG tablet, Take 1 tablet by mouth twice daily, Disp: 180 tablet, Rfl: 0   Multiple Vitamins-Minerals (PRESERVISION AREDS 2 PO), Take by mouth., Disp: , Rfl:    mupirocin ointment (BACTROBAN) 2 %, Place 1 application into the nose 2 (two) times daily., Disp: 22 g, Rfl:  1   Omega-3 Fatty Acids (OMEGA 3 500 PO), Take 1 capsule by mouth daily. , Disp: , Rfl:    pantoprazole (PROTONIX) 40 MG tablet, Take 1 tablet by mouth once daily, Disp: 90 tablet, Rfl: 0   potassium chloride (K-DUR) 10 MEQ tablet, Take 10 mEq by mouth daily., Disp: , Rfl:    PREMARIN vaginal cream, INSERT ONE APPLICATORFUL VAGINALLY TWICE WEEKLY, Disp: 90 g, Rfl: 2   Probiotic Product (PROBIOTIC DAILY) CAPS, Take 1 capsule by mouth., Disp: , Rfl:    simvastatin (ZOCOR) 40 MG tablet, TAKE 1 TABLET BY MOUTH ONCE DAILY AT  6PM, Disp: 90 tablet, Rfl: 0   ketoconazole (NIZORAL) 2 % cream, Apply 1 application topically daily as needed.  (Patient not taking: Reported on 01/04/2021), Disp: , Rfl:    triamcinolone ointment (KENALOG) 0.1 %, Apply topically 2 (two) times daily., Disp: 240 g, Rfl: 0  Physical exam:  Vitals:   01/04/21 1116  BP: (!) 145/85  Pulse: (!) 54  Resp: 20  Temp: 97.8 F (36.6 C)  TempSrc: Tympanic  SpO2: 98%  Weight: 139 lb 14.4 oz (63.5 kg)   Physical Exam Constitutional:      General: She is not in acute distress. Cardiovascular:     Rate and Rhythm: Normal rate and regular rhythm.     Heart sounds: Normal heart sounds.  Pulmonary:     Effort: Pulmonary effort is normal.     Breath sounds: Normal breath sounds.  Abdominal:     General: Bowel sounds are normal.     Palpations: Abdomen is soft.  Skin:     General: Skin is warm and dry.  Neurological:     Mental Status: She is alert and oriented to person, place, and time.     CMP Latest Ref Rng & Units 12/20/2020  Glucose 70 - 99 mg/dL 86  BUN 6 - 23 mg/dL 27(H)  Creatinine 0.40 - 1.20 mg/dL 0.83  Sodium 135 - 145 mEq/L 135  Potassium 3.5 - 5.1 mEq/L 4.6  Chloride 96 - 112 mEq/L 100  CO2 19 - 32 mEq/L 26  Calcium 8.4 - 10.5 mg/dL 9.0  Total Protein 6.0 - 8.3 g/dL 6.4  Total Bilirubin 0.2 - 1.2 mg/dL 0.6  Alkaline Phos 39 - 117 U/L 60  AST 0 - 37 U/L 21  ALT 0 - 35 U/L 12   CBC Latest Ref Rng & Units 12/20/2020  WBC 4.0 - 10.5 K/uL 5.3  Hemoglobin 12.0 - 15.0 g/dL 13.4  Hematocrit 36.0 - 46.0 % 41.0  Platelets 150.0 - 400.0 K/uL 238.0     Assessment and plan- Patient is a 75 y.o. female referred for iron deficiency without overt anemia  Patient is not presently anemic with a hemoglobin of 13.4 but ferritin levels were low at 14 with an iron saturation of 16%.  Patient reports having significant fatigue.She has had problems tolerating IV iron in the past and I will therefore proceed with 2 doses of Feraheme 510 mg IV weekly.  Discussed risks and benefits of Feraheme including all but not limited to headache, dizziness, leg spasms and possible risk of infusion reaction.  Patient understands and agrees to proceed as planned.  I will see her back in 8 weeks with CBC ferritin iron studies B12 and folate for video visit.  She will need to speak to [facility] about consideration for capsule endoscopy since that has not been done and patient has ongoing evidence of iron deficiency.  Her  recent UA was negative for blood.   Visit Diagnosis 1. Iron deficiency      Dr. Randa Evens, MD, MPH [facility] [phone] 01/07/2021 8:10 AM

## 2021-01-18 ENCOUNTER — Inpatient Hospital Stay: Payer: PPO | Attending: Oncology

## 2021-01-18 ENCOUNTER — Telehealth: Payer: Self-pay | Admitting: Family Medicine

## 2021-01-18 ENCOUNTER — Ambulatory Visit: Payer: PPO

## 2021-01-18 VITALS — BP 126/62 | HR 56 | Temp 96.7°F | Resp 20

## 2021-01-18 DIAGNOSIS — E611 Iron deficiency: Secondary | ICD-10-CM | POA: Insufficient documentation

## 2021-01-18 DIAGNOSIS — L409 Psoriasis, unspecified: Secondary | ICD-10-CM

## 2021-01-18 MED ORDER — SODIUM CHLORIDE 0.9 % IV SOLN
Freq: Once | INTRAVENOUS | Status: AC
  Filled 2021-01-18: qty 250

## 2021-01-18 MED ORDER — SODIUM CHLORIDE 0.9 % IV SOLN
510.0000 mg | INTRAVENOUS | Status: DC
  Administered 2021-01-18: 510 mg via INTRAVENOUS
  Filled 2021-01-18: qty 510

## 2021-01-18 NOTE — Telephone Encounter (Signed)
Patient came into the office and is requesting a prescription for Triamcinolone Acetonide Ointment USP, 0.1%. She wants a large size, and 90 day supply with refills.

## 2021-01-18 NOTE — Patient Instructions (Signed)
Ferumoxytol injection What is this medication? FERUMOXYTOL is an iron complex. Iron is used to make healthy red blood cells, which carry oxygen and nutrients throughout the body. This medicine is used totreat iron deficiency anemia. This medicine may be used for other purposes; ask your health care provider orpharmacist if you have questions. COMMON BRAND NAME(S): Feraheme What should I tell my care team before I take this medication? They need to know if you have any of these conditions: anemia not caused by low iron levels high levels of iron in the blood magnetic resonance imaging (MRI) test scheduled an unusual or allergic reaction to iron, other medicines, foods, dyes, or preservatives pregnant or trying to get pregnant breast-feeding How should I use this medication? This medicine is for injection into a vein. It is given by a health careprofessional in a hospital or clinic setting. Talk to your pediatrician regarding the use of this medicine in children.Special care may be needed. Overdosage: If you think you have taken too much of this medicine contact apoison control center or emergency room at once. NOTE: This medicine is only for you. Do not share this medicine with others. What if I miss a dose? It is important not to miss your dose. Call your doctor or health careprofessional if you are unable to keep an appointment. What may interact with this medication? This medicine may interact with the following medications: other iron products This list may not describe all possible interactions. Give your health care provider a list of all the medicines, herbs, non-prescription drugs, or dietary supplements you use. Also tell them if you smoke, drink alcohol, or use illegaldrugs. Some items may interact with your medicine. What should I watch for while using this medication? Visit your doctor or healthcare professional regularly. Tell your doctor or healthcare professional if your  symptoms do not start to get better or if theyget worse. You may need blood work done while you are taking this medicine. You may need to follow a special diet. Talk to your doctor. Foods that contain iron include: whole grains/cereals, dried fruits, beans, or peas, leafy greenvegetables, and organ meats (liver, kidney). What side effects may I notice from receiving this medication? Side effects that you should report to your doctor or health care professionalas soon as possible: allergic reactions like skin rash, itching or hives, swelling of the face, lips, or tongue breathing problems changes in blood pressure feeling faint or lightheaded, falls fever or chills flushing, sweating, or hot feelings swelling of the ankles or feet Side effects that usually do not require medical attention (report to yourdoctor or health care professional if they continue or are bothersome): diarrhea headache nausea, vomiting stomach pain This list may not describe all possible side effects. Call your doctor for medical advice about side effects. You may report side effects to FDA at1-800-FDA-1088. Where should I keep my medication? This drug is given in a hospital or clinic and will not be stored at home. NOTE: This sheet is a summary. It may not cover all possible information. If you have questions about this medicine, talk to your doctor, pharmacist, orhealth care provider.  2022 Elsevier/Gold Standard (2016-08-14 20:21:10)  

## 2021-01-19 MED ORDER — TRIAMCINOLONE ACETONIDE 0.1 % EX OINT: TOPICAL_OINTMENT | Freq: Two times a day (BID) | CUTANEOUS | 0 refills | Status: DC

## 2021-01-19 NOTE — Telephone Encounter (Signed)
Spoken to pharmacy, they stated the 240g is usually for a month supply. But they can not go into detail because they do not know where and how much the patient is using on her body. Is she placing it head to toe or how thick she is placing on areas. The pharmacist stated that they only go up to 1 lb of the medication.

## 2021-01-19 NOTE — Telephone Encounter (Signed)
Spoken to patient. Patient has the correct dosage and 240g last her 90 days. She wanted the ointment not the cream. Rx was ointment. Pharmacy has changed medication per patient. I have printed off the script and had DOD sign for patient to pick up. What we have in epic is correct but what patient picked up is wrong. Pharmacy has our correct dose. So unsure what the miscommunication was from. Rx has been placed up front for patient pick up [id number]

## 2021-01-19 NOTE — Telephone Encounter (Signed)
Noted. Can you contact the patient and see how long the 240 g amount is lasting her? Please confirm how much of it she is using at a time. Thanks.

## 2021-01-19 NOTE — Telephone Encounter (Signed)
Please call the pharmacy and see if they can advise on what a 90 day supply of this would be. I sent in a fairly large quantity most recently and if that is not enough for 90 days then I will need some guidance on an appropriate volume.

## 2021-01-19 NOTE — Addendum Note (Signed)
Addended by: Elpidio Galea T on: 01/19/2021 02:30 PM   Modules accepted: Orders

## 2021-01-20 DIAGNOSIS — L409 Psoriasis, unspecified: Secondary | ICD-10-CM | POA: Diagnosis not present

## 2021-01-20 DIAGNOSIS — L4050 Arthropathic psoriasis, unspecified: Secondary | ICD-10-CM | POA: Diagnosis not present

## 2021-01-20 DIAGNOSIS — Z111 Encounter for screening for respiratory tuberculosis: Secondary | ICD-10-CM | POA: Diagnosis not present

## 2021-01-20 DIAGNOSIS — M170 Bilateral primary osteoarthritis of knee: Secondary | ICD-10-CM | POA: Diagnosis not present

## 2021-01-20 DIAGNOSIS — Z79899 Other long term (current) drug therapy: Secondary | ICD-10-CM | POA: Diagnosis not present

## 2021-01-25 ENCOUNTER — Other Ambulatory Visit: Payer: Self-pay

## 2021-01-25 ENCOUNTER — Inpatient Hospital Stay: Payer: PPO

## 2021-01-25 VITALS — BP 104/56 | HR 59 | Temp 96.8°F

## 2021-01-25 DIAGNOSIS — E611 Iron deficiency: Secondary | ICD-10-CM | POA: Diagnosis not present

## 2021-01-25 MED ORDER — SODIUM CHLORIDE 0.9 % IV SOLN
510.0000 mg | INTRAVENOUS | Status: DC
  Administered 2021-01-25: 510 mg via INTRAVENOUS
  Filled 2021-01-25: qty 510

## 2021-01-25 MED ORDER — SODIUM CHLORIDE 0.9 % IV SOLN
Freq: Once | INTRAVENOUS | Status: AC
  Filled 2021-01-25: qty 250

## 2021-01-25 NOTE — Patient Instructions (Signed)
Ferumoxytol injection What is this medication? FERUMOXYTOL is an iron complex. Iron is used to make healthy red blood cells, which carry oxygen and nutrients throughout the body. This medicine is used totreat iron deficiency anemia. This medicine may be used for other purposes; ask your health care provider orpharmacist if you have questions. COMMON BRAND NAME(S): Feraheme What should I tell my care team before I take this medication? They need to know if you have any of these conditions: anemia not caused by low iron levels high levels of iron in the blood magnetic resonance imaging (MRI) test scheduled an unusual or allergic reaction to iron, other medicines, foods, dyes, or preservatives pregnant or trying to get pregnant breast-feeding How should I use this medication? This medicine is for injection into a vein. It is given by a health careprofessional in a hospital or clinic setting. Talk to your pediatrician regarding the use of this medicine in children.Special care may be needed. Overdosage: If you think you have taken too much of this medicine contact apoison control center or emergency room at once. NOTE: This medicine is only for you. Do not share this medicine with others. What if I miss a dose? It is important not to miss your dose. Call your doctor or health careprofessional if you are unable to keep an appointment. What may interact with this medication? This medicine may interact with the following medications: other iron products This list may not describe all possible interactions. Give your health care provider a list of all the medicines, herbs, non-prescription drugs, or dietary supplements you use. Also tell them if you smoke, drink alcohol, or use illegaldrugs. Some items may interact with your medicine. What should I watch for while using this medication? Visit your doctor or healthcare professional regularly. Tell your doctor or healthcare professional if your  symptoms do not start to get better or if theyget worse. You may need blood work done while you are taking this medicine. You may need to follow a special diet. Talk to your doctor. Foods that contain iron include: whole grains/cereals, dried fruits, beans, or peas, leafy greenvegetables, and organ meats (liver, kidney). What side effects may I notice from receiving this medication? Side effects that you should report to your doctor or health care professionalas soon as possible: allergic reactions like skin rash, itching or hives, swelling of the face, lips, or tongue breathing problems changes in blood pressure feeling faint or lightheaded, falls fever or chills flushing, sweating, or hot feelings swelling of the ankles or feet Side effects that usually do not require medical attention (report to yourdoctor or health care professional if they continue or are bothersome): diarrhea headache nausea, vomiting stomach pain This list may not describe all possible side effects. Call your doctor for medical advice about side effects. You may report side effects to FDA at1-800-FDA-1088. Where should I keep my medication? This drug is given in a hospital or clinic and will not be stored at home. NOTE: This sheet is a summary. It may not cover all possible information. If you have questions about this medicine, talk to your doctor, pharmacist, orhealth care provider.  2022 Elsevier/Gold Standard (2016-08-14 20:21:10)  

## 2021-01-31 DIAGNOSIS — B351 Tinea unguium: Secondary | ICD-10-CM | POA: Diagnosis not present

## 2021-01-31 DIAGNOSIS — M25371 Other instability, right ankle: Secondary | ICD-10-CM | POA: Diagnosis not present

## 2021-01-31 DIAGNOSIS — M2011 Hallux valgus (acquired), right foot: Secondary | ICD-10-CM | POA: Diagnosis not present

## 2021-01-31 DIAGNOSIS — M79674 Pain in right toe(s): Secondary | ICD-10-CM | POA: Diagnosis not present

## 2021-01-31 DIAGNOSIS — M216X1 Other acquired deformities of right foot: Secondary | ICD-10-CM | POA: Diagnosis not present

## 2021-01-31 DIAGNOSIS — M2141 Flat foot [pes planus] (acquired), right foot: Secondary | ICD-10-CM | POA: Diagnosis not present

## 2021-01-31 DIAGNOSIS — M19071 Primary osteoarthritis, right ankle and foot: Secondary | ICD-10-CM | POA: Diagnosis not present

## 2021-01-31 DIAGNOSIS — M25571 Pain in right ankle and joints of right foot: Secondary | ICD-10-CM | POA: Diagnosis not present

## 2021-01-31 DIAGNOSIS — M7671 Peroneal tendinitis, right leg: Secondary | ICD-10-CM | POA: Diagnosis not present

## 2021-01-31 DIAGNOSIS — Q66221 Congenital metatarsus adductus, right foot: Secondary | ICD-10-CM | POA: Diagnosis not present

## 2021-01-31 DIAGNOSIS — M2041 Other hammer toe(s) (acquired), right foot: Secondary | ICD-10-CM | POA: Diagnosis not present

## 2021-01-31 DIAGNOSIS — M25572 Pain in left ankle and joints of left foot: Secondary | ICD-10-CM | POA: Diagnosis not present

## 2021-02-04 ENCOUNTER — Telehealth: Payer: Self-pay | Admitting: Family Medicine

## 2021-02-04 NOTE — Telephone Encounter (Signed)
Left message for patient to call back and schedule Medicare Annual Wellness Visit (AWV) in office.   If not able to come in office, please offer to do virtually or by telephone.   Last AWV:01/16/2020  Please schedule at anytime with Nurse Health Advisor.

## 2021-02-07 ENCOUNTER — Other Ambulatory Visit: Payer: Self-pay

## 2021-02-07 ENCOUNTER — Ambulatory Visit (INDEPENDENT_AMBULATORY_CARE_PROVIDER_SITE_OTHER): Payer: PPO

## 2021-02-07 VITALS — BP 119/73 | Temp 97.3°F | Resp 15 | Ht 60.0 in | Wt 141.0 lb

## 2021-02-07 DIAGNOSIS — Z0000 Encounter for general adult medical examination without abnormal findings: Secondary | ICD-10-CM | POA: Diagnosis not present

## 2021-02-07 DIAGNOSIS — Z1231 Encounter for screening mammogram for malignant neoplasm of breast: Secondary | ICD-10-CM

## 2021-02-07 NOTE — Progress Notes (Addendum)
Subjective:   TOLEEN SPRADLIN is a 75 y.o. female who presents for Medicare Annual (Subsequent) preventive examination.  Review of Systems    No ROS.  Medicare Wellness    Cardiac Risk Factors include: advanced age (>35mn, >>22women);hypertension     Objective:    Today's Vitals   02/07/21 1232  BP: 119/73  Resp: 15  Temp: (!) 97.3 F (36.3 C)  SpO2: 96%  Weight: 141 lb (64 kg)  Height: 5' (1.524 m)   Body mass index is 27.54 kg/m.  Advanced Directives 02/07/2021 05/24/2020 01/16/2020 01/15/2019 06/18/2018 10/02/2017 06/15/2017  Does Patient Have a Medical Advance Directive? Yes Yes Yes Yes No Yes Yes  Type of AParamedicof ALuedersLiving will HHarrellLiving will HGrove CityLiving will HSterlingLiving will - HFredoniaLiving will HMead ValleyLiving will  Does patient want to make changes to medical advance directive? No - Patient declined - No - Patient declined No - Patient declined - No - Patient declined -  Copy of HM[place]fordin Chart? Yes - validated most recent copy scanned in chart (See row information) Yes - validated most recent copy scanned in chart (See row information) Yes - validated most recent copy scanned in chart (See row information) Yes - validated most recent copy scanned in chart (See row information) - Yes Yes  Would patient like information on creating a medical advance directive? - - - - No - Patient declined - -    Current Medications (verified) Outpatient Encounter Medications as of 02/07/2021  Medication Sig   methotrexate (RHEUMATREX) 2.5 MG tablet Take by mouth.   clobetasol (OLUX) 0.05 % topical foam Apply topically 2 (two) times daily.   clobetasol ointment (TEMOVATE) 0AB-123456789% Apply 1 application topically 2 (two) times daily as needed. For rash. Avoid face.   Cyanocobalamin (VITAMIN B 12 PO) Take by mouth.    ketoconazole (NIZORAL) 2 % cream Apply 1 application topically daily as needed.  (Patient not taking: Reported on 01/04/2021)   metoprolol tartrate (LOPRESSOR) 100 MG tablet Take 1 tablet by mouth twice daily   Multiple Vitamins-Minerals (PRESERVISION AREDS 2 PO) Take by mouth.   mupirocin ointment (BACTROBAN) 2 % Place 1 application into the nose 2 (two) times daily.   Omega-3 Fatty Acids (OMEGA 3 500 PO) Take 1 capsule by mouth daily.    pantoprazole (PROTONIX) 40 MG tablet Take 1 tablet by mouth once daily   potassium chloride (K-DUR) 10 MEQ tablet Take 10 mEq by mouth daily.   PREMARIN vaginal cream INSERT ONE APPLICATORFUL VAGINALLY TWICE WEEKLY   Probiotic Product (PROBIOTIC DAILY) CAPS Take 1 capsule by mouth.   simvastatin (ZOCOR) 40 MG tablet TAKE 1 TABLET BY MOUTH ONCE DAILY AT  6PM   triamcinolone ointment (KENALOG) 0.1 % Apply topically 2 (two) times daily.   No facility-administered encounter medications on file as of 02/07/2021.    Allergies (verified) Gluten meal   History: Past Medical History:  Diagnosis Date   Abdominal pain    Adjustment disorder with anxiety    Anemia    Anxiety    adjustment disorder with anxiety   Arthritis    Atrophic vaginitis    BPPV (benign paroxysmal positional vertigo)    CAD in native artery    Cataract    Chronic diarrhea    Chronic pain of right knee    Complication of anesthesia    Coronary  artery disease    Dysphagia    Dysphasia    Dysuria    Family history of colon cancer    Gastric ulcer 1980's   Gastric ulcer    Gastroesophageal hernia    GERD (gastroesophageal reflux disease)    High cholesterol    Hyperlipidemia    Hypertension    Macular degeneration    Osteoporosis    Osteoporosis    post-menopausal   Pedal edema    Psoriasis    Schatzki's ring 05/02/2013   Schatzki's ring    Scoliosis    Scoliosis    UTI (urinary tract infection)    Past Surgical History:  Procedure Laterality Date   ABDOMINAL  HYSTERECTOMY     CARDIAC CATHETERIZATION     CHOLECYSTECTOMY     COLONOSCOPY     COLONOSCOPY N/A 11/12/2014   Procedure: COLONOSCOPY;  Surgeon: Manya Silvas, MD;  Location: [facility];  Service: Endoscopy;  Laterality: N/A;   COLONOSCOPY N/A 05/24/2020   Procedure: COLONOSCOPY;  Surgeon: Toledo, Benay Pike, MD;  Location: [facility] ENDOSCOPY;  Service: Endoscopy;  Laterality: N/A;   DIAGNOSTIC LAPAROSCOPY     paraesphogeal hernia   ESOPHAGOGASTRODUODENOSCOPY     ESOPHAGOGASTRODUODENOSCOPY (EGD) WITH PROPOFOL N/A 04/16/2017   Procedure: ESOPHAGOGASTRODUODENOSCOPY (EGD) WITH PROPOFOL;  Surgeon: Manya Silvas, MD;  Location: [facility] ENDOSCOPY;  Service: Endoscopy;  Laterality: N/A;   HERNIA REPAIR     inguinal hernia   HERNIA REPAIR     umbilical hernia   Family History  Problem Relation Age of Onset   Diabetes Mother    Parkinson's disease Mother    Cancer Father        Pancreatic   Cancer Sister        Lung   Diabetes Sister    Alzheimer's disease Paternal Aunt        x2 sisters   Diabetes Sister    Alzheimer's disease Sister    Tics Sister    Breast cancer Neg Hx    Social History   Socioeconomic History   Marital status: Single    Spouse name: Not on file   Number of children: Not on file   Years of education: Not on file   Highest education level: Not on file  Occupational History   Not on file  Tobacco Use   Smoking status: Never   Smokeless tobacco: Never  Vaping Use   Vaping Use: Never used  Substance and Sexual Activity   Alcohol use: No    Alcohol/week: 0.0 standard drinks   Drug use: No   Sexual activity: Never  Other Topics Concern   Not on file  Social History Narrative   Divorced   Retired as a Scientist, research (medical)   12 + years of school    Social Determinants of Radio broadcast assistant Strain: Low Risk    Difficulty of Paying Living Expenses: Not hard at all  Food Insecurity: No Food Insecurity   Worried About Charity fundraiser in the Last  Year: Never true   Arboriculturist in the Last Year: Never true  Transportation Needs: No Transportation Needs   Lack of Transportation (Medical): No   Lack of Transportation (Non-Medical): No  Physical Activity: Not on file  Stress: No Stress Concern Present   Feeling of Stress : Not at all  Social Connections: Unknown   Frequency of Communication with Friends and Family: More than three times a week   Frequency of Social  Gatherings with Friends and Family: More than three times a week   Attends Religious Services: Not on file   Active Member of Clubs or Organizations: Not on file   Attends Archivist Meetings: Not on file   Marital Status: Not on file    Tobacco Counseling Counseling given: Not Answered   Clinical Intake:  Pre-visit preparation completed: Yes        Diabetes: No  How often do you need to have someone help you when you read instructions, pamphlets, or other written materials from your doctor or pharmacy?: 1 - Never  Interpreter Needed?: No      Activities of Daily Living In your present state of health, do you have any difficulty performing the following activities: 02/07/2021  Hearing? N  Vision? N  Difficulty concentrating or making decisions? N  Walking or climbing stairs? Y  Comment Chronic knee pain; injections received.  Dressing or bathing? N  Doing errands, shopping? N  Preparing Food and eating ? N  Using the Toilet? N  In the past six months, have you accidently leaked urine? N  Do you have problems with loss of bowel control? N  Managing your Medications? N  Managing your Finances? N  Housekeeping or managing your Housekeeping? N  Some recent data might be hidden    Patient Care Team: Leone Haven, MD as PCP - General (Family Medicine) Sindy Guadeloupe, MD as Consulting Physician (Hematology and Oncology)  Indicate any recent Medical Services you may have received from other than [facility] providers in the past year  (date may be approximate).     Assessment:   This is a routine wellness examination for Nuriya.  Hearing/Vision screen Hearing Screening - Comments:: Patient is able to hear conversational tones without difficulty.  No issues reported. Vision Screening - Comments:: Followed by [facility] Wears corrective lenses  Macular degeneration  They have regular follow up with the ophthalmologist  Dietary issues and exercise activities discussed: Current Exercise Habits: Home exercise routine, Intensity: Mild [place] diet Good water intake    Goals Addressed               This Visit's Progress     Patient Stated     Maintain Healthy Lifestyle (pt-stated)        Stay active Stay hydrated Healthy diet       Depression Screen PHQ 2/9 Scores 02/07/2021 06/21/2020 01/16/2020 12/17/2019 07/18/2019 01/15/2019 02/12/2018  PHQ - 2 Score 0 0 1 0 0 0 0    Fall Risk Fall Risk  02/07/2021 12/20/2020 01/16/2020 12/17/2019 07/18/2019  Falls in the past year? 0 0 0 0 0  Comment - - - - -  Number falls in past yr: 0 0 0 0 0  Injury with Fall? - 0 - - -  Follow up Falls evaluation completed Falls evaluation completed Falls evaluation completed Falls evaluation completed Falls evaluation completed    [place]: Adequate lighting in your home to reduce risk of falls? Yes   ASSISTIVE DEVICES UTILIZED TO PREVENT FALLS: Life alert? No  Use of a cane, walker or w/c? Yes , cane as needed. Brace worn on knees as needed.  Grab bars in the bathroom? Yes  Shower chair or bench in shower? Yes Elevated toilet seat or a handicapped toilet? Yes   TIMED UP AND GO: Was the test performed? Yes .  Length of time to ambulate 10 feet: 10 sec.  Gait slow and steady without use of assistive device  Cognitive Function: MMSE - Mini Mental State Exam 09/29/2016 09/30/2015  Orientation to time 5 5  Orientation to Place 5 5  Registration 3 3  Attention/ Calculation 5 5  Recall 3 3   Language- name 2 objects 2 2  Language- repeat 1 1  Language- follow 3 step command 3 3  Language- read & follow direction 1 1  Write a sentence 1 1  Copy design 1 1  Total score 30 30     6CIT Screen 02/07/2021 01/16/2020 01/15/2019 10/02/2017  What Year? 0 points 0 points 0 points 0 points  What month? 0 points 0 points 0 points 0 points  What time? 0 points 0 points 0 points 0 points  Count back from 20 0 points - 0 points 0 points  Months in reverse 0 points 0 points 0 points 0 points  Repeat phrase 0 points 0 points 0 points 0 points  Total Score 0 - 0 0    Immunizations Immunization History  Administered Date(s) Administered   Fluad Quad(high Dose 65+) 03/31/2019   Influenza, High Dose Seasonal PF 03/31/2016, 04/06/2017, 03/27/2018, 03/29/2018, 03/30/2020   Influenza-Unspecified 04/14/2015   PFIZER Comirnaty(Gray Top)Covid-19 Tri-Sucrose Vaccine 10/12/2020   PFIZER(Purple Top)SARS-COV-2 Vaccination 07/28/2019, 08/18/2019   Pneumococcal Conjugate-13 09/30/2015   Pneumococcal-Unspecified 07/17/2011   Tdap 07/21/2015   Zoster, Live 08/17/2011   Shingles vaccine- Due, Education has been provided regarding the importance of this vaccine. Advised may receive this vaccine at local pharmacy or Health Dept. Aware to provide a copy of the vaccination record if obtained from local pharmacy or Health Dept. Verbalized acceptance and understanding.  Health Maintenance Health Maintenance  Topic Date Due   COVID-19 Vaccine (4 - Booster for Pfizer series) 01/11/2021   INFLUENZA VACCINE  02/07/2021   Zoster Vaccines- Shingrix (1 of 2) 05/10/2021 (Originally 08/14/1964)   TETANUS/TDAP  07/20/2025   DEXA SCAN  Completed   Hepatitis C Screening  Completed   PNA vac Low Risk Adult  Completed   HPV VACCINES  Aged Out   Mammogram status: Completed 04/30/20. Repeat every year. Ordered. Patient aware to schedule as appropriate.   Vision Screening: Recommended annual ophthalmology exams for early  detection of glaucoma and other disorders of the eye.  Dental Screening: Recommended annual dental exams for proper oral hygiene  Community Resource Referral / Chronic Care Management: CRR required this visit?  No   CCM required this visit?  No      Plan:   Keep all routine maintenance appointments.   I have personally reviewed and noted the following in the patient's chart:   Medical and social history Use of alcohol, tobacco or illicit drugs  Current medications and supplements including opioid prescriptions. Patient is not taking opioid.  Functional ability and status Nutritional status Physical activity Advanced directives List of other physicians Hospitalizations, surgeries, and ER visits in previous 12 months Vitals Screenings to include cognitive, depression, and falls Referrals and appointments  In addition, I have reviewed and discussed with patient certain preventive protocols, quality metrics, and best practice recommendations. A written personalized care plan for preventive services as well as general preventive health recommendations were provided to patient via mychart.     Varney Biles, LPN   [id number]

## 2021-02-07 NOTE — Patient Instructions (Addendum)
Lindsay Tran , Thank you for taking time to come for your Medicare Wellness Visit. I appreciate your ongoing commitment to your health goals. Please review the following plan we discussed and let me know if I can assist you in the future.   These are the goals we discussed:  Goals       Patient Stated     Maintain Healthy Lifestyle (pt-stated)      Stay active Stay hydrated Healthy diet        This is a list of the screening recommended for you and due dates:  Health Maintenance  Topic Date Due   COVID-19 Vaccine (4 - Booster for Pfizer series) 01/11/2021   Flu Shot  02/07/2021   Zoster (Shingles) Vaccine (1 of 2) 05/10/2021*   Tetanus Vaccine  07/20/2025   DEXA scan (bone density measurement)  Completed   Hepatitis C Screening: USPSTF Recommendation to screen - Ages 18-79 yo.  Completed   Pneumonia vaccines  Completed   HPV Vaccine  Aged Out  *Topic was postponed. The date shown is not the original due date.   Advanced directives: on file  Conditions/risks identified: none new  Follow up in one year for your annual wellness visit    Preventive Care 65 Years and Older, Female Preventive care refers to lifestyle choices and visits with your health care provider that can promote health and wellness. What does preventive care include? A yearly physical exam. This is also called an annual well check. Dental exams once or twice a year. Routine eye exams. Ask your health care provider how often you should have your eyes checked. Personal lifestyle choices, including: Daily care of your teeth and gums. Regular physical activity. Eating a healthy diet. Avoiding tobacco and drug use. Limiting alcohol use. Practicing safe sex. Taking low-dose aspirin every day. Taking vitamin and mineral supplements as recommended by your health care provider. What happens during an annual well check? The services and screenings done by your health care provider during your annual well check  will depend on your age, overall health, lifestyle risk factors, and family history of disease. Counseling  Your health care provider may ask you questions about your: Alcohol use. Tobacco use. Drug use. Emotional well-being. Home and relationship well-being. Sexual activity. Eating habits. History of falls. Memory and ability to understand (cognition). Work and work Statistician. Reproductive health. Screening  You may have the following tests or measurements: Height, weight, and BMI. Blood pressure. Lipid and cholesterol levels. These may be checked every 5 years, or more frequently if you are over 60 years old. Skin check. Lung cancer screening. You may have this screening every year starting at age 66 if you have a 30-pack-year history of smoking and currently smoke or have quit within the past 15 years. Fecal occult blood test (FOBT) of the stool. You may have this test every year starting at age 104. Flexible sigmoidoscopy or colonoscopy. You may have a sigmoidoscopy every 5 years or a colonoscopy every 10 years starting at age 33. Hepatitis C blood test. Hepatitis B blood test. Sexually transmitted disease (STD) testing. Diabetes screening. This is done by checking your blood sugar (glucose) after you have not eaten for a while (fasting). You may have this done every 1-3 years. Bone density scan. This is done to screen for osteoporosis. You may have this done starting at age 72. Mammogram. This may be done every 1-2 years. Talk to your health care provider about how often you should have  regular mammograms. Talk with your health care provider about your test results, treatment options, and if necessary, the need for more tests. Vaccines  Your health care provider may recommend certain vaccines, such as: Influenza vaccine. This is recommended every year. Tetanus, diphtheria, and acellular pertussis (Tdap, Td) vaccine. You may need a Td booster every 10 years. Zoster vaccine. You  may need this after age 29. Pneumococcal 13-valent conjugate (PCV13) vaccine. One dose is recommended after age 73. Pneumococcal polysaccharide (PPSV23) vaccine. One dose is recommended after age 41. Talk to your health care provider about which screenings and vaccines you need and how often you need them. This information is not intended to replace advice given to you by your health care provider. Make sure you discuss any questions you have with your health care provider. Document Released: 07/23/2015 Document Revised: 03/15/2016 Document Reviewed: 04/27/2015 Elsevier Interactive Patient Education  2017 [place] Prevention in the Home Falls can cause injuries. They can happen to people of all ages. There are many things you can do to make your home safe and to help prevent falls. What can I do on the outside of my home? Regularly fix the edges of walkways and driveways and fix any cracks. Remove anything that might make you trip as you walk through a door, such as a raised step or threshold. Trim any bushes or trees on the path to your home. Use bright outdoor lighting. Clear any walking paths of anything that might make someone trip, such as rocks or tools. Regularly check to see if handrails are loose or broken. Make sure that both sides of any steps have handrails. Any raised decks and porches should have guardrails on the edges. Have any leaves, snow, or ice cleared regularly. Use sand or salt on walking paths during winter. Clean up any spills in your garage right away. This includes oil or grease spills. What can I do in the bathroom? Use night lights. Install grab bars by the toilet and in the tub and shower. Do not use towel bars as grab bars. Use non-skid mats or decals in the tub or shower. If you need to sit down in the shower, use a plastic, non-slip stool. Keep the floor dry. Clean up any water that spills on the floor as soon as it happens. Remove soap buildup  in the tub or shower regularly. Attach bath mats securely with double-sided non-slip rug tape. Do not have throw rugs and other things on the floor that can make you trip. What can I do in the bedroom? Use night lights. Make sure that you have a light by your bed that is easy to reach. Do not use any sheets or blankets that are too big for your bed. They should not hang down onto the floor. Have a firm chair that has side arms. You can use this for support while you get dressed. Do not have throw rugs and other things on the floor that can make you trip. What can I do in the kitchen? Clean up any spills right away. Avoid walking on wet floors. Keep items that you use a lot in easy-to-reach places. If you need to reach something above you, use a strong step stool that has a grab bar. Keep electrical cords out of the way. Do not use floor polish or wax that makes floors slippery. If you must use wax, use non-skid floor wax. Do not have throw rugs and other things on the floor that  can make you trip. What can I do with my stairs? Do not leave any items on the stairs. Make sure that there are handrails on both sides of the stairs and use them. Fix handrails that are broken or loose. Make sure that handrails are as long as the stairways. Check any carpeting to make sure that it is firmly attached to the stairs. Fix any carpet that is loose or worn. Avoid having throw rugs at the top or bottom of the stairs. If you do have throw rugs, attach them to the floor with carpet tape. Make sure that you have a light switch at the top of the stairs and the bottom of the stairs. If you do not have them, ask someone to add them for you. What else can I do to help prevent falls? Wear shoes that: Do not have high heels. Have rubber bottoms. Are comfortable and fit you well. Are closed at the toe. Do not wear sandals. If you use a stepladder: Make sure that it is fully opened. Do not climb a closed  stepladder. Make sure that both sides of the stepladder are locked into place. Ask someone to hold it for you, if possible. Clearly mark and make sure that you can see: Any grab bars or handrails. First and last steps. Where the edge of each step is. Use tools that help you move around (mobility aids) if they are needed. These include: Canes. Walkers. Scooters. Crutches. Turn on the lights when you go into a dark area. Replace any light bulbs as soon as they burn out. Set up your furniture so you have a clear path. Avoid moving your furniture around. If any of your floors are uneven, fix them. If there are any pets around you, be aware of where they are. Review your medicines with your doctor. Some medicines can make you feel dizzy. This can increase your chance of falling. Ask your doctor what other things that you can do to help prevent falls. This information is not intended to replace advice given to you by your health care provider. Make sure you discuss any questions you have with your health care provider. Document Released: 04/22/2009 Document Revised: 12/02/2015 Document Reviewed: 07/31/2014 Elsevier Interactive Patient Education  2017 [employer].

## 2021-02-09 ENCOUNTER — Other Ambulatory Visit: Payer: Self-pay

## 2021-02-09 ENCOUNTER — Ambulatory Visit: Payer: PPO | Admitting: Gastroenterology

## 2021-02-09 ENCOUNTER — Encounter: Payer: Self-pay | Admitting: Gastroenterology

## 2021-02-09 VITALS — BP 138/83 | HR 62 | Temp 98.4°F | Ht 60.0 in | Wt 139.0 lb

## 2021-02-09 DIAGNOSIS — D509 Iron deficiency anemia, unspecified: Secondary | ICD-10-CM

## 2021-02-09 NOTE — Progress Notes (Signed)
[name] MD, MRCP(U.K) [address]  [place]  [place], [place] [address]  Main: [phone]  Fax: [phone]   Gastroenterology Consultation  Referring Provider:     Leone Haven, MD Primary Care Physician:  Leone Haven, MD Primary Gastroenterologist:  Dr. [name]  Reason for Consultation:     Iron deficiency anemia        HPI:   Lindsay Tran is a 75 y.o. y/o female referred for consultation & management  by Dr. Caryl Bis, Angela Adam, MD.    12/20/2020 CMP normal, ferritin 14, hemoglobin normal at 13.4 g/year back was 11.9 g.  Urine analysis shows no abnormalities B12 folate pending.  Last colonoscopy by Dr. Alice Reichert in November 2021 showed nonbleeding internal hemorrhoids a 4 mm polyp was found in the proximal colon which was resected.  She has received IV iron with hematology. She denies any nasal bleeds or hematemesis or melena.  She says she has had H. pylori in the past and has been treated 3 times.  She has had a hiatal hernia repair in the past.  She was known to have had gastric polyps and an ulcer in the esophagus. Past Medical History:  Diagnosis Date   Abdominal pain    Adjustment disorder with anxiety    Anemia    Anxiety    adjustment disorder with anxiety   Arthritis    Atrophic vaginitis    BPPV (benign paroxysmal positional vertigo)    CAD in native artery    Cataract    Chronic diarrhea    Chronic pain of right knee    Complication of anesthesia    Coronary artery disease    Dysphagia    Dysphasia    Dysuria    Family history of colon cancer    Gastric ulcer 1980's   Gastric ulcer    Gastroesophageal hernia    GERD (gastroesophageal reflux disease)    High cholesterol    Hyperlipidemia    Hypertension    Macular degeneration    Osteoporosis    Osteoporosis    post-menopausal   Pedal edema    Psoriasis    Schatzki's ring 05/02/2013   Schatzki's ring    Scoliosis    Scoliosis    UTI (urinary tract infection)      Past Surgical History:  Procedure Laterality Date   ABDOMINAL HYSTERECTOMY     CARDIAC CATHETERIZATION     CHOLECYSTECTOMY     COLONOSCOPY     COLONOSCOPY N/A 11/12/2014   Procedure: COLONOSCOPY;  Surgeon: Manya Silvas, MD;  Location: [facility] ENDOSCOPY;  Service: Endoscopy;  Laterality: N/A;   COLONOSCOPY N/A 05/24/2020   Procedure: COLONOSCOPY;  Surgeon: Toledo, Benay Pike, MD;  Location: [facility] ENDOSCOPY;  Service: Endoscopy;  Laterality: N/A;   DIAGNOSTIC LAPAROSCOPY     paraesphogeal hernia   ESOPHAGOGASTRODUODENOSCOPY     ESOPHAGOGASTRODUODENOSCOPY (EGD) WITH PROPOFOL N/A 04/16/2017   Procedure: ESOPHAGOGASTRODUODENOSCOPY (EGD) WITH PROPOFOL;  Surgeon: Manya Silvas, MD;  Location: [facility] ENDOSCOPY;  Service: Endoscopy;  Laterality: N/A;   HERNIA REPAIR     inguinal hernia   HERNIA REPAIR     umbilical hernia    Prior to Admission medications   Medication Sig Start Date End Date Taking? Authorizing Provider  clobetasol (OLUX) 0.05 % topical foam Apply topically 2 (two) times daily. 01/15/19   Leone Haven, MD  clobetasol ointment (TEMOVATE) AB-123456789 % Apply 1 application topically 2 (two) times daily as needed. For  rash. Avoid face. 06/21/20   Leone Haven, MD  Cyanocobalamin (VITAMIN B 12 PO) Take by mouth.    [provider]  ketoconazole (NIZORAL) 2 % cream Apply 1 application topically daily as needed.  Patient not taking: Reported on 01/04/2021 08/07/16   [provider]  methotrexate (RHEUMATREX) 2.5 MG tablet Take by mouth. 01/20/21   [provider]  metoprolol tartrate (LOPRESSOR) 100 MG tablet Take 1 tablet by mouth twice daily 12/21/20   Leone Haven, MD  Multiple Vitamins-Minerals (PRESERVISION AREDS 2 PO) Take by mouth.    [provider]  mupirocin ointment (BACTROBAN) 2 % Place 1 application into the nose 2 (two) times daily. 09/15/18   Leone Haven, MD  Omega-3 Fatty Acids (OMEGA 3 500 PO) Take 1 capsule by mouth  daily.     [provider]  pantoprazole (PROTONIX) 40 MG tablet Take 1 tablet by mouth once daily 12/21/20   Leone Haven, MD  potassium chloride (K-DUR) 10 MEQ tablet Take 10 mEq by mouth daily.    [provider]  PREMARIN vaginal cream INSERT ONE APPLICATORFUL VAGINALLY TWICE WEEKLY 11/15/20   Leone Haven, MD  Probiotic Product (PROBIOTIC DAILY) CAPS Take 1 capsule by mouth.    [provider]  simvastatin (ZOCOR) 40 MG tablet TAKE 1 TABLET BY MOUTH ONCE DAILY AT  [facility] 12/21/20   Leone Haven, MD  triamcinolone ointment (KENALOG) 0.1 % Apply topically 2 (two) times daily. 01/19/21   McLean-Scocuzza, Nino Glow, MD    Family History  Problem Relation Age of Onset   Diabetes Mother    Parkinson's disease Mother    Cancer Father        Pancreatic   Cancer Sister        Lung   Diabetes Sister    Alzheimer's disease Paternal Aunt        x2 sisters   Diabetes Sister    Alzheimer's disease Sister    Tics Sister    Breast cancer Neg Hx      Social History   Tobacco Use   Smoking status: Never   Smokeless tobacco: Never  Vaping Use   Vaping Use: Never used  Substance Use Topics   Alcohol use: No    Alcohol/week: 0.0 standard drinks   Drug use: No    Allergies as of 02/09/2021 - Review Complete 02/09/2021  Allergen Reaction Noted   Gluten meal Rash 12/20/2020    Review of Systems:    All systems reviewed and negative except where noted in HPI.   Physical Exam:  BP 138/83   Pulse 62   Temp 98.4 F (36.9 C) (Oral)   Ht 5' (1.524 m)   Wt 139 lb (63 kg)   BMI 27.15 kg/m  No LMP recorded. Patient has had a hysterectomy. Psych:  Alert and cooperative. Normal mood and affect. General:   Alert,  Well-developed, well-nourished, pleasant and cooperative in NAD Head:  Normocephalic and atraumatic. Eyes:  Sclera clear, no icterus.   Conjunctiva pink. Ears:  Normal auditory acuity. Nose:  No deformity, discharge, or lesions. Lungs:   Respirations even and unlabored.  Clear throughout to auscultation.   No wheezes, crackles, or rhonchi. No acute distress. Heart:  Regular rate and rhythm; no murmurs, clicks, rubs, or gallops. Abdomen:  Normal bowel sounds.  No bruits.  Soft, non-tender and non-distended without masses, hepatosplenomegaly or hernias noted.  No guarding or rebound tenderness.    Neurologic:  Alert and oriented x3;  grossly normal neurologically. Psych:  Alert and cooperative. Normal mood and affect.  Imaging Studies: No results found.  Assessment and Plan:   Lindsay Tran is a 75 y.o. y/o female has been referred for iron deficiency anemia.  She has had a colonoscopy in 2021 which showed no abnormalities.  She has not had a recent EGD or capsule study of small bowel.  She received IV iron with hematology.  Her history suggests she has had H. pylori as well as a hiatal hernia.   Plan  Celiac serology , H pylori breath test  EGD and if negative needs capsule study of the small bowel   I have discussed alternative options, risks & benefits,  which include, but are not limited to, bleeding, infection, perforation,respiratory complication & drug reaction.  The patient agrees with this plan & written consent will be obtained.     Follow up in 8-12 weeks   Dr [name] MD,MRCP(U.K)

## 2021-02-10 ENCOUNTER — Ambulatory Visit
Admission: RE | Admit: 2021-02-10 | Discharge: 2021-02-10 | Disposition: A | Discharge status: Home / Self Care | Payer: PPO | Attending: Gastroenterology | Admitting: Gastroenterology

## 2021-02-10 ENCOUNTER — Encounter
Admission: RE | Disposition: A | Discharge status: Home / Self Care | Payer: Self-pay | Source: Home / Self Care | Attending: Gastroenterology

## 2021-02-10 ENCOUNTER — Other Ambulatory Visit: Payer: Self-pay

## 2021-02-10 ENCOUNTER — Encounter: Payer: Self-pay | Admitting: Gastroenterology

## 2021-02-10 ENCOUNTER — Ambulatory Visit: Payer: PPO | Admitting: Anesthesiology

## 2021-02-10 DIAGNOSIS — K2970 Gastritis, unspecified, without bleeding: Secondary | ICD-10-CM | POA: Diagnosis not present

## 2021-02-10 DIAGNOSIS — D509 Iron deficiency anemia, unspecified: Secondary | ICD-10-CM | POA: Insufficient documentation

## 2021-02-10 DIAGNOSIS — Z9049 Acquired absence of other specified parts of digestive tract: Secondary | ICD-10-CM | POA: Diagnosis not present

## 2021-02-10 DIAGNOSIS — K2950 Unspecified chronic gastritis without bleeding: Secondary | ICD-10-CM | POA: Insufficient documentation

## 2021-02-10 DIAGNOSIS — K5710 Diverticulosis of small intestine without perforation or abscess without bleeding: Secondary | ICD-10-CM | POA: Diagnosis not present

## 2021-02-10 DIAGNOSIS — Z79899 Other long term (current) drug therapy: Secondary | ICD-10-CM | POA: Diagnosis not present

## 2021-02-10 DIAGNOSIS — K3189 Other diseases of stomach and duodenum: Secondary | ICD-10-CM | POA: Diagnosis not present

## 2021-02-10 HISTORY — PX: ESOPHAGOGASTRODUODENOSCOPY (EGD) WITH PROPOFOL: SHX5813

## 2021-02-10 SURGERY — ESOPHAGOGASTRODUODENOSCOPY (EGD) WITH PROPOFOL
Anesthesia: General

## 2021-02-10 MED ORDER — PROPOFOL 500 MG/50ML IV EMUL
INTRAVENOUS | Status: AC
  Filled 2021-02-10: qty 50

## 2021-02-10 MED ORDER — PROPOFOL 500 MG/50ML IV EMUL
INTRAVENOUS | Status: DC | PRN
  Administered 2021-02-10: 150 ug/kg/min via INTRAVENOUS

## 2021-02-10 MED ORDER — PROPOFOL 10 MG/ML IV BOLUS
INTRAVENOUS | Status: DC | PRN
  Administered 2021-02-10: 70 mg via INTRAVENOUS
  Administered 2021-02-10: 10 mg via INTRAVENOUS

## 2021-02-10 MED ORDER — LIDOCAINE HCL (CARDIAC) PF 100 MG/5ML IV SOSY
PREFILLED_SYRINGE | INTRAVENOUS | Status: DC | PRN
  Administered 2021-02-10: 40 mg via INTRAVENOUS

## 2021-02-10 MED ORDER — SODIUM CHLORIDE 0.9 % IV SOLN: INTRAVENOUS | Status: DC

## 2021-02-10 NOTE — Op Note (Signed)
[facility] Gastroenterology Patient Name: Lindsay Tran Procedure Date: 02/10/2021 8:25 AM MRN: [id number] Account #: [id number] Date of Birth: 1945/11/14 Admit Type: Outpatient Age: 75 Room: [facility] ENDO ROOM 3 Gender: Female Note Status: Finalized Procedure:             Upper GI endoscopy Indications:           Iron deficiency anemia Providers:             [name] MD, MD Referring MD:          Angela Adam. Caryl Bis (Referring MD) Medicines:             Monitored Anesthesia Care Complications:         No immediate complications. Procedure:             Pre-Anesthesia Assessment:                        - Prior to the procedure, a History and Physical was                         performed, and patient medications, allergies and                         sensitivities were reviewed. The patient's tolerance                         of previous anesthesia was reviewed.                        - The risks and benefits of the procedure and the                         sedation options and risks were discussed with the                         patient. All questions were answered and informed                         consent was obtained.                        - ASA Grade Assessment: II - A patient with mild                         systemic disease.                        After obtaining informed consent, the endoscope was                         passed under direct vision. Throughout the procedure,                         the patient's blood pressure, pulse, and oxygen                         saturations were monitored continuously. The Endoscope                         was introduced through the mouth, and  advanced to the                         third part of duodenum. The upper GI endoscopy was                         accomplished with ease. The patient tolerated the                         procedure well. Findings:      The esophagus was normal.      A large non-bleeding  diverticulum was found in the second portion of the       duodenum.      Normal mucosa was found in the entire duodenum. Biopsies were taken with       a cold forceps for histology.      Diffuse moderate inflammation characterized by congestion (edema),       erythema and granularity was found in the entire examined stomach.       Biopsies were taken with a cold forceps for histology.      Localized nodular mucosa was found in the gastric antrum. Biopsies were       taken with a cold forceps for histology.      The cardia and gastric fundus were normal on retroflexion. Impression:            - Normal esophagus.                        - Non-bleeding duodenal diverticulum.                        - Normal mucosa was found in the entire examined                         duodenum. Biopsied.                        - Gastritis. Biopsied.                        - Nodular mucosa in the gastric antrum. Biopsied. Recommendation:        - Await pathology results.                        - Discharge patient to home (with escort).                        - Resume previous diet.                        - Continue present medications.                        - To visualize the small bowel, perform video capsule                         endoscopy in 2 weeks. Procedure Code(s):     --- Professional ---                        [phone], Esophagogastroduodenoscopy, flexible,  transoral; with biopsy, single or multiple Diagnosis Code(s):     --- Professional ---                        K29.70, Gastritis, unspecified, without bleeding                        K31.89, Other diseases of stomach and duodenum                        D50.9, Iron deficiency anemia, unspecified                        K57.10, Diverticulosis of small intestine without                         perforation or abscess without bleeding CPT copyright 2019 American Medical Association. All rights reserved. The codes documented in  this report are preliminary and upon coder review may  be revised to meet current compliance requirements. [name], MD [name] MD, MD 02/10/2021 8:39:21 AM This report has been signed electronically. Number of Addenda: 0 Note Initiated On: 02/10/2021 8:25 AM Estimated Blood Loss:  Estimated blood loss: none.      [facility]

## 2021-02-10 NOTE — Anesthesia Postprocedure Evaluation (Signed)
Anesthesia Post Note  Patient: Lindsay Tran  Procedure(s) Performed: ESOPHAGOGASTRODUODENOSCOPY (EGD) WITH PROPOFOL  Patient location during evaluation: Endoscopy Anesthesia Type: General Level of consciousness: awake and alert Pain management: pain level controlled Vital Signs Assessment: post-procedure vital signs reviewed and stable Respiratory status: spontaneous breathing, nonlabored ventilation, respiratory function stable and patient connected to nasal cannula oxygen Cardiovascular status: blood pressure returned to baseline and stable Postop Assessment: no apparent nausea or vomiting Anesthetic complications: no   No notable events documented.   Last Vitals:  Vitals:   02/10/21 0844 02/10/21 0854  BP: 130/77 140/76  Pulse: 62   Resp: (!) 21   Temp:    SpO2: 95%     Last Pain:  Vitals:   02/10/21 0904  TempSrc:   PainSc: 0-No pain                 Martha Clan

## 2021-02-10 NOTE — Anesthesia Procedure Notes (Signed)
Date/Time: 02/10/2021 8:26 AM Performed by: Doreen Salvage, CRNA Pre-anesthesia Checklist: Patient identified, Emergency Drugs available, Suction available and Patient being monitored Patient Re-evaluated:Patient Re-evaluated prior to induction Oxygen Delivery Method: Nasal cannula Induction Type: IV induction Dental Injury: Teeth and Oropharynx as per pre-operative assessment  Comments: Nasal cannula with etCO2 monitoring

## 2021-02-10 NOTE — H&P (Signed)
Lindsay Bellows, MD [address], [place], [place], [place], [address] [address] [place], [place], [place], [address] Phone: [phone]  Fax: [phone]  Primary Care Physician:  Leone Haven, MD   Pre-Procedure History & Physical: HPI:  Lindsay Tran is a 75 y.o. female is here for an endoscopy    Past Medical History:  Diagnosis Date   Abdominal pain    Adjustment disorder with anxiety    Anemia    Anxiety    adjustment disorder with anxiety   Arthritis    Atrophic vaginitis    BPPV (benign paroxysmal positional vertigo)    CAD in native artery    Cataract    Chronic diarrhea    Chronic pain of right knee    Coronary artery disease    Dysphagia    Dysphasia    Dysuria    Family history of colon cancer    Gastric ulcer 1980's   Gastric ulcer    Gastroesophageal hernia    GERD (gastroesophageal reflux disease)    High cholesterol    Hyperlipidemia    Hypertension    Macular degeneration    Osteoporosis    Osteoporosis    post-menopausal   Pedal edema    Psoriasis    Schatzki's ring 05/02/2013   Schatzki's ring    Scoliosis    Scoliosis    UTI (urinary tract infection)     Past Surgical History:  Procedure Laterality Date   ABDOMINAL HYSTERECTOMY     CARDIAC CATHETERIZATION     CHOLECYSTECTOMY     COLONOSCOPY     COLONOSCOPY N/A 11/12/2014   Procedure: COLONOSCOPY;  Surgeon: Manya Silvas, MD;  Location: [facility] ENDOSCOPY;  Service: Endoscopy;  Laterality: N/A;   COLONOSCOPY N/A 05/24/2020   Procedure: COLONOSCOPY;  Surgeon: Toledo, Benay Pike, MD;  Location: [facility] ENDOSCOPY;  Service: Endoscopy;  Laterality: N/A;   DIAGNOSTIC LAPAROSCOPY     paraesphogeal hernia   ESOPHAGOGASTRODUODENOSCOPY     ESOPHAGOGASTRODUODENOSCOPY (EGD) WITH PROPOFOL N/A 04/16/2017   Procedure: ESOPHAGOGASTRODUODENOSCOPY (EGD) WITH PROPOFOL;  Surgeon: Manya Silvas, MD;  Location: [facility] ENDOSCOPY;  Service: Endoscopy;  Laterality: N/A;   HERNIA REPAIR      inguinal hernia   HERNIA REPAIR     umbilical hernia    Prior to Admission medications   Medication Sig Start Date End Date Taking? Authorizing Provider  clobetasol (OLUX) 0.05 % topical foam Apply topically 2 (two) times daily. 01/15/19  Yes Leone Haven, MD  clobetasol ointment (TEMOVATE) AB-123456789 % Apply 1 application topically 2 (two) times daily as needed. For rash. Avoid face. 06/21/20  Yes Leone Haven, MD  Cyanocobalamin (VITAMIN B 12 PO) Take by mouth.   Yes [provider]  folic acid (FOLVITE) 1 MG tablet Take 1 mg by mouth daily. 01/20/21 01/20/22 Yes [provider]  ketoconazole (NIZORAL) 2 % cream Apply 1 application topically daily as needed. 08/07/16  Yes [provider]  magnesium oxide (MAG-OX) 400 MG tablet Take 1 tablet by mouth daily.   Yes [provider]  methotrexate (RHEUMATREX) 2.5 MG tablet Take by mouth. 01/20/21  Yes [provider]  metoprolol tartrate (LOPRESSOR) 100 MG tablet Take 1 tablet by mouth twice daily 12/21/20  Yes Leone Haven, MD  Multiple Vitamins-Minerals (PRESERVISION AREDS 2 PO) Take by mouth.   Yes [provider]  mupirocin ointment (BACTROBAN) 2 % Place 1 application into the nose 2 (two) times daily. 09/15/18  Yes Leone Haven, MD  Omega-3 Fatty Acids (OMEGA 3 500 PO) Take 1 capsule by mouth daily.    Yes [provider]  pantoprazole (PROTONIX) 40 MG tablet Take 1 tablet by mouth once daily 12/21/20  Yes Leone Haven, MD  potassium chloride (K-DUR) 10 MEQ tablet Take 10 mEq by mouth daily.   Yes [provider]  PREMARIN vaginal cream INSERT ONE APPLICATORFUL VAGINALLY TWICE WEEKLY 11/15/20  Yes Leone Haven, MD  Probiotic Product (PROBIOTIC DAILY) CAPS Take 1 capsule by mouth.    [provider]  simvastatin (ZOCOR) 40 MG tablet TAKE 1 TABLET BY MOUTH ONCE DAILY AT  [facility] 12/21/20   Leone Haven, MD  triamcinolone ointment (KENALOG) 0.1 %  Apply topically 2 (two) times daily. 01/19/21   McLean-Scocuzza, Nino Glow, MD    Allergies as of 02/09/2021 - Review Complete 02/09/2021  Allergen Reaction Noted   Gluten meal Rash 12/20/2020    Family History  Problem Relation Age of Onset   Diabetes Mother    Parkinson's disease Mother    Cancer Father        Pancreatic   Cancer Sister        Lung   Diabetes Sister    Alzheimer's disease Paternal Aunt        x2 sisters   Diabetes Sister    Alzheimer's disease Sister    Tics Sister    Breast cancer Neg Hx     Social History   Socioeconomic History   Marital status: Single    Spouse name: Not on file   Number of children: Not on file   Years of education: Not on file   Highest education level: Not on file  Occupational History   Not on file  Tobacco Use   Smoking status: Never   Smokeless tobacco: Never  Vaping Use   Vaping Use: Never used  Substance and Sexual Activity   Alcohol use: No    Alcohol/week: 0.0 standard drinks   Drug use: No   Sexual activity: Never  Other Topics Concern   Not on file  Social History Narrative   Divorced   Retired as a Scientist, research (medical)   12 + years of school    Social Determinants of Radio broadcast assistant Strain: Low Risk    Difficulty of Paying Living Expenses: Not hard at all  Food Insecurity: No Food Insecurity   Worried About Charity fundraiser in the Last Year: Never true   Arboriculturist in the Last Year: Never true  Transportation Needs: No Transportation Needs   Lack of Transportation (Medical): No   Lack of Transportation (Non-Medical): No  Physical Activity: Not on file  Stress: No Stress Concern Present   Feeling of Stress : Not at all  Social Connections: Unknown   Frequency of Communication with Friends and Family: More than three times a week   Frequency of Social Gatherings with Friends and Family: More than three times a week   Attends Religious Services: Not on Electrical engineer or  Organizations: Not on file   Attends Archivist Meetings: Not on file   Marital Status: Not on file  Intimate Partner Violence: Not At Risk   Fear of Current or Ex-Partner: No   Emotionally Abused: No   Physically Abused: No   Sexually Abused: No    Review of Systems: See HPI, otherwise negative ROS  Physical Exam:  BP (!) 159/81   Pulse 65   Temp (!) 96.8 F (36 C) (Temporal)   Resp 18   Ht 5' (1.524 m)   Wt 63 kg   SpO2 97%   BMI 27.15 kg/m  General:   Alert,  pleasant and cooperative in NAD Head:  Normocephalic and atraumatic. Neck:  Supple; no masses or thyromegaly. Lungs:  Clear throughout to auscultation, normal respiratory effort.    Heart:  +S1, +S2, Regular rate and rhythm, No edema. Abdomen:  Soft, nontender and nondistended. Normal bowel sounds, without guarding, and without rebound.   Neurologic:  Alert and  oriented x4;  grossly normal neurologically.  Impression/Plan: Lindsay Tran is here for an endoscopy  to be performed for  evaluation of iron deficiency anemia    Risks, benefits, limitations, and alternatives regarding endoscopy have been reviewed with the patient.  Questions have been answered.  All parties agreeable.   Lindsay Bellows, MD  02/10/2021, 8:14 AM

## 2021-02-10 NOTE — Transfer of Care (Signed)
Immediate Anesthesia Transfer of Care Note  Patient: Lindsay Tran  Procedure(s) Performed: Procedure(s): ESOPHAGOGASTRODUODENOSCOPY (EGD) WITH PROPOFOL (N/A)  Patient Location: PACU and Endoscopy Unit  Anesthesia Type:General  Level of Consciousness: sedated  Airway & Oxygen Therapy: Patient Spontanous Breathing and Patient connected to nasal cannula oxygen  Post-op Assessment: Report given to RN and Post -op Vital signs reviewed and stable  Post vital signs: Reviewed and stable  Last Vitals:  Vitals:   02/10/21 0755 02/10/21 0844  BP: (!) 159/81 130/77  Pulse: 65 62  Resp: 18 (!) 21  Temp: (!) 36 C (!) 36.1 C  SpO2: [id number]    Complications: No apparent anesthesia complications

## 2021-02-10 NOTE — Anesthesia Preprocedure Evaluation (Signed)
Anesthesia Evaluation  Patient identified by MRN, date of birth, ID band Patient awake    Reviewed: Allergy & Precautions, NPO status , Patient's Chart, lab work & pertinent test results  History of Anesthesia Complications (+) PONV and history of anesthetic complications  Airway Mallampati: II  TM Distance: >3 FB Neck ROM: Full    Dental  (+) Dental Advidsory Given   Pulmonary neg pulmonary ROS, neg shortness of breath, neg sleep apnea, neg COPD, neg recent URI,    breath sounds clear to auscultation- rhonchi (-) wheezing      Cardiovascular hypertension, Pt. on medications (-) angina(-) CAD, (-) Past MI, (-) Cardiac Stents and (-) CABG  Rhythm:Regular Rate:Normal - Systolic murmurs and - Diastolic murmurs    Neuro/Psych neg Seizures PSYCHIATRIC DISORDERS Anxiety negative neurological ROS     GI/Hepatic Neg liver ROS, hiatal hernia, PUD, GERD  ,  Endo/Other  negative endocrine ROSneg diabetes  Renal/GU negative Renal ROS     Musculoskeletal  (+) Arthritis ,   Abdominal (+) - obese,   Peds  Hematology  (+) Blood dyscrasia, anemia ,   Anesthesia Other Findings Past Medical History: No date: Abdominal pain No date: Adjustment disorder with anxiety No date: Anemia No date: Anxiety     Comment:  adjustment disorder with anxiety No date: Arthritis No date: Atrophic vaginitis No date: BPPV (benign paroxysmal positional vertigo) No date: CAD in native artery No date: Cataract No date: Chronic diarrhea No date: Chronic pain of right knee No date: Complication of anesthesia No date: Coronary artery disease No date: Dysphagia No date: Dysphasia No date: Dysuria No date: Family history of colon cancer 1980's: Gastric ulcer No date: Gastric ulcer No date: Gastroesophageal hernia No date: GERD (gastroesophageal reflux disease) No date: High cholesterol No date: Hyperlipidemia No date: Hypertension No date:  Macular degeneration No date: Osteoporosis No date: Osteoporosis     Comment:  post-menopausal No date: Pedal edema No date: Psoriasis 05/02/2013: Schatzki's ring No date: Schatzki's ring No date: Scoliosis No date: Scoliosis No date: UTI (urinary tract infection)   Reproductive/Obstetrics                             Anesthesia Physical  Anesthesia Plan  ASA: 2  Anesthesia Plan: General   Post-op Pain Management:    Induction: Intravenous  PONV Risk Score and Plan: 3 and Propofol infusion and TIVA  Airway Management Planned: Natural Airway and Nasal Cannula  Additional Equipment:   Intra-op Plan:   Post-operative Plan:   Informed Consent: I have reviewed the patients History and Physical, chart, labs and discussed the procedure including the risks, benefits and alternatives for the proposed anesthesia with the patient or authorized representative who has indicated his/her understanding and acceptance.     Dental advisory given  Plan Discussed with: CRNA and Anesthesiologist  Anesthesia Plan Comments:         Anesthesia Quick Evaluation

## 2021-02-11 LAB — CELIAC DISEASE AB SCREEN W/RFX
Antigliadin Abs, IgA: 5 units (ref 0–19)
IgA/Immunoglobulin A, Serum: 193 mg/dL (ref 64–422)
Transglutaminase IgA: <2 U/mL (ref 0–3)

## 2021-02-11 LAB — H. PYLORI BREATH TEST: H pylori Breath Test: NEGATIVE

## 2021-02-11 LAB — SURGICAL PATHOLOGY

## 2021-02-14 ENCOUNTER — Encounter: Payer: Self-pay | Admitting: Gastroenterology

## 2021-02-17 ENCOUNTER — Telehealth: Payer: Self-pay | Admitting: Gastroenterology

## 2021-02-17 ENCOUNTER — Other Ambulatory Visit: Payer: Self-pay

## 2021-02-17 ENCOUNTER — Ambulatory Visit: Payer: PPO | Admitting: Gastroenterology

## 2021-02-17 DIAGNOSIS — D509 Iron deficiency anemia, unspecified: Secondary | ICD-10-CM

## 2021-02-17 NOTE — Telephone Encounter (Signed)
Patient is calling about scheduling a capsule study per Dr. Vicente Males.

## 2021-02-17 NOTE — Telephone Encounter (Signed)
Called patient back and she stated that she was calling to schedule her capsule study ordered by Dr. Vicente Males. her procedure is schedule for 03/03/2021.

## 2021-02-18 ENCOUNTER — Telehealth: Payer: Self-pay

## 2021-02-18 NOTE — Telephone Encounter (Signed)
Trish called we needed to reschedule Mrs. Brunken for a different day so she was rescheduled for 03/08/2021 by phone

## 2021-02-21 DIAGNOSIS — M25572 Pain in left ankle and joints of left foot: Secondary | ICD-10-CM | POA: Diagnosis not present

## 2021-02-21 DIAGNOSIS — M216X1 Other acquired deformities of right foot: Secondary | ICD-10-CM | POA: Diagnosis not present

## 2021-02-21 DIAGNOSIS — Q66221 Congenital metatarsus adductus, right foot: Secondary | ICD-10-CM | POA: Diagnosis not present

## 2021-02-21 DIAGNOSIS — M2011 Hallux valgus (acquired), right foot: Secondary | ICD-10-CM | POA: Diagnosis not present

## 2021-02-21 DIAGNOSIS — M2041 Other hammer toe(s) (acquired), right foot: Secondary | ICD-10-CM | POA: Diagnosis not present

## 2021-02-21 DIAGNOSIS — Q66222 Congenital metatarsus adductus, left foot: Secondary | ICD-10-CM | POA: Diagnosis not present

## 2021-02-21 DIAGNOSIS — M19071 Primary osteoarthritis, right ankle and foot: Secondary | ICD-10-CM | POA: Diagnosis not present

## 2021-02-21 DIAGNOSIS — M7672 Peroneal tendinitis, left leg: Secondary | ICD-10-CM | POA: Diagnosis not present

## 2021-02-21 DIAGNOSIS — M25371 Other instability, right ankle: Secondary | ICD-10-CM | POA: Diagnosis not present

## 2021-02-21 DIAGNOSIS — M2141 Flat foot [pes planus] (acquired), right foot: Secondary | ICD-10-CM | POA: Diagnosis not present

## 2021-02-21 DIAGNOSIS — M79674 Pain in right toe(s): Secondary | ICD-10-CM | POA: Diagnosis not present

## 2021-02-21 DIAGNOSIS — M25372 Other instability, left ankle: Secondary | ICD-10-CM | POA: Diagnosis not present

## 2021-02-25 ENCOUNTER — Inpatient Hospital Stay: Payer: PPO | Attending: Oncology

## 2021-02-25 DIAGNOSIS — E611 Iron deficiency: Secondary | ICD-10-CM

## 2021-02-25 DIAGNOSIS — I10 Essential (primary) hypertension: Secondary | ICD-10-CM | POA: Diagnosis not present

## 2021-02-25 DIAGNOSIS — E7800 Pure hypercholesterolemia, unspecified: Secondary | ICD-10-CM | POA: Insufficient documentation

## 2021-02-25 DIAGNOSIS — D509 Iron deficiency anemia, unspecified: Secondary | ICD-10-CM | POA: Insufficient documentation

## 2021-02-25 DIAGNOSIS — Z79899 Other long term (current) drug therapy: Secondary | ICD-10-CM | POA: Insufficient documentation

## 2021-02-25 LAB — VITAMIN B12: Vitamin B-12: 2108 pg/mL — ABNORMAL HIGH (ref 180–914)

## 2021-02-25 LAB — CBC WITH DIFFERENTIAL/PLATELET
Abs Immature Granulocytes: 0.02 10*3/uL (ref 0.00–0.07)
Basophils Absolute: 0 10*3/uL (ref 0.0–0.1)
Basophils Relative: 0 %
Eosinophils Absolute: 0.1 10*3/uL (ref 0.0–0.5)
Eosinophils Relative: 2 %
HCT: 44.6 % (ref 36.0–46.0)
Hemoglobin: 14.5 g/dL (ref 12.0–15.0)
Immature Granulocytes: 0 %
Lymphocytes Relative: 23 %
Lymphs Abs: 1.5 10*3/uL (ref 0.7–4.0)
MCH: 30 pg (ref 26.0–34.0)
MCHC: 32.5 g/dL (ref 30.0–36.0)
MCV: 92.3 fL (ref 80.0–100.0)
Monocytes Absolute: 0.4 10*3/uL (ref 0.1–1.0)
Monocytes Relative: 6 %
Neutro Abs: 4.5 10*3/uL (ref 1.7–7.7)
Neutrophils Relative %: 69 %
Platelets: 219 10*3/uL (ref 150–400)
RBC: 4.83 MIL/uL (ref 3.87–5.11)
RDW: 17.2 % — ABNORMAL HIGH (ref 11.5–15.5)
WBC: 6.5 10*3/uL (ref 4.0–10.5)
nRBC: 0 % (ref 0.0–0.2)

## 2021-02-25 LAB — TSH: TSH: 2.553 u[IU]/mL (ref 0.350–4.500)

## 2021-02-25 LAB — FOLATE: Folate: >100 ng/mL (ref >5.9)

## 2021-02-25 LAB — FERRITIN: Ferritin: 367 ng/mL — ABNORMAL HIGH (ref 11–307)

## 2021-02-28 ENCOUNTER — Inpatient Hospital Stay (HOSPITAL_BASED_OUTPATIENT_CLINIC_OR_DEPARTMENT_OTHER): Payer: PPO | Admitting: Oncology

## 2021-02-28 DIAGNOSIS — E611 Iron deficiency: Secondary | ICD-10-CM | POA: Diagnosis not present

## 2021-02-28 NOTE — Progress Notes (Signed)
Patient called/ pre- screened for virtual appoinment today with oncologist. No new concerns  

## 2021-02-28 NOTE — Progress Notes (Signed)
I connected with Lindsay Tran on 02/28/21 at  1:15 PM EDT by video enabled telemedicine visit and verified that I am speaking with the correct person using two identifiers.   I discussed the limitations, risks, security and privacy concerns of performing an evaluation and management service by telemedicine and the availability of in-person appointments. I also discussed with the patient that there may be a patient responsible charge related to this service. The patient expressed understanding and agreed to proceed.  Other persons participating in the visit and their role in the encounter:  none  Patient's location:  home Provider's location:  home  Chief Complaint:  routine f/u of iron deficiency anemia  History of present illness: Patient is a 75 year old female who was seen in the past for thrombocytosis which subsequently resolved.  Presently she has been referred to [place] for iron deficiency anemia.  Most recent CBC from 12/20/2020 Showed white count of 5.3, H&H of 13.4/41 with a platelet count of 238.  Ferritin was low at 14.5 with an iron saturation of 16.6.  Patient reports having significant fatigue.  Denies any consistent use of NSAIDs.  Denies any blood loss in her stool or urine.  She has undergone colonoscopy with Dr. Alice Reichert in mid November 2021 which showed nonbleeding internal hemorrhoids and diverticulosis in the sigmoid colon.  Last endoscopy was in 2018 which showed evidence of gastritis but otherwise normal esophagus.  Interval history patient reports feeling significantly better and her energy levels have improved after receiving IV iron in July 2022.She denies any complaints presently.  Colonoscopy in November 2021 and in August 2022 was unremarkable.  She is also scheduled to undergo capsule endoscopy.   Review of Systems  Constitutional:  Negative for chills, fever, malaise/fatigue and weight loss.  HENT:  Negative for congestion, ear discharge and nosebleeds.   Eyes:  Negative  for blurred vision.  Respiratory:  Negative for cough, hemoptysis, sputum production, shortness of breath and wheezing.   Cardiovascular:  Negative for chest pain, palpitations, orthopnea and claudication.  Gastrointestinal:  Negative for abdominal pain, blood in stool, constipation, diarrhea, heartburn, melena, nausea and vomiting.  Genitourinary:  Negative for dysuria, flank pain, frequency, hematuria and urgency.  Musculoskeletal:  Negative for back pain, joint pain and myalgias.  Skin:  Negative for rash.  Neurological:  Negative for dizziness, tingling, focal weakness, seizures, weakness and headaches.  Endo/Heme/Allergies:  Does not bruise/bleed easily.  Psychiatric/Behavioral:  Negative for depression and suicidal ideas. The patient does not have insomnia.    Allergies  Allergen Reactions   Gluten Meal Rash    Past Medical History:  Diagnosis Date   Abdominal pain    Adjustment disorder with anxiety    Anemia    Anxiety    adjustment disorder with anxiety   Arthritis    Atrophic vaginitis    BPPV (benign paroxysmal positional vertigo)    CAD in native artery    Cataract    Chronic diarrhea    Chronic pain of right knee    Coronary artery disease    Dysphagia    Dysphasia    Dysuria    Family history of colon cancer    Gastric ulcer 1980's   Gastric ulcer    Gastroesophageal hernia    GERD (gastroesophageal reflux disease)    High cholesterol    Hyperlipidemia    Hypertension    Macular degeneration    Osteoporosis    Osteoporosis    post-menopausal   Pedal edema  Psoriasis    Schatzki's ring 05/02/2013   Schatzki's ring    Scoliosis    Scoliosis    UTI (urinary tract infection)     Past Surgical History:  Procedure Laterality Date   ABDOMINAL HYSTERECTOMY     CARDIAC CATHETERIZATION     CHOLECYSTECTOMY     COLONOSCOPY     COLONOSCOPY N/A 11/12/2014   Procedure: COLONOSCOPY;  Surgeon: Manya Silvas, MD;  Location: [place];  Service:  Endoscopy;  Laterality: N/A;   COLONOSCOPY N/A 05/24/2020   Procedure: COLONOSCOPY;  Surgeon: Toledo, Benay Pike, MD;  Location: [facility] ENDOSCOPY;  Service: Endoscopy;  Laterality: N/A;   DIAGNOSTIC LAPAROSCOPY     paraesphogeal hernia   ESOPHAGOGASTRODUODENOSCOPY     ESOPHAGOGASTRODUODENOSCOPY (EGD) WITH PROPOFOL N/A 04/16/2017   Procedure: ESOPHAGOGASTRODUODENOSCOPY (EGD) WITH PROPOFOL;  Surgeon: Manya Silvas, MD;  Location: [facility] ENDOSCOPY;  Service: Endoscopy;  Laterality: N/A;   ESOPHAGOGASTRODUODENOSCOPY (EGD) WITH PROPOFOL N/A 02/10/2021   Procedure: ESOPHAGOGASTRODUODENOSCOPY (EGD) WITH PROPOFOL;  Surgeon: [name], MD;  Location: [facility] ENDOSCOPY;  Service: Gastroenterology;  Laterality: N/A;   HERNIA REPAIR     inguinal hernia   HERNIA REPAIR     umbilical hernia    Social History   Socioeconomic History   Marital status: Single    Spouse name: Not on file   Number of children: Not on file   Years of education: Not on file   Highest education level: Not on file  Occupational History   Not on file  Tobacco Use   Smoking status: Never   Smokeless tobacco: Never  Vaping Use   Vaping Use: Never used  Substance and Sexual Activity   Alcohol use: No    Alcohol/week: 0.0 standard drinks   Drug use: No   Sexual activity: Never  Other Topics Concern   Not on file  Social History Narrative   Divorced   Retired as a Scientist, research (medical)   12 + years of school    Social Determinants of Radio broadcast assistant Strain: Low Risk    Difficulty of Paying Living Expenses: Not hard at all  Food Insecurity: No Food Insecurity   Worried About Charity fundraiser in the Last Year: Never true   Arboriculturist in the Last Year: Never true  Transportation Needs: No Transportation Needs   Lack of Transportation (Medical): No   Lack of Transportation (Non-Medical): No  Physical Activity: Not on file  Stress: No Stress Concern Present   Feeling of Stress : Not at all  Social  Connections: Unknown   Frequency of Communication with Friends and Family: More than three times a week   Frequency of Social Gatherings with Friends and Family: More than three times a week   Attends Religious Services: Not on Electrical engineer or Organizations: Not on file   Attends Archivist Meetings: Not on file   Marital Status: Not on file  Intimate Partner Violence: Not At Risk   Fear of Current or Ex-Partner: No   Emotionally Abused: No   Physically Abused: No   Sexually Abused: No    Family History  Problem Relation Age of Onset   Diabetes Mother    Parkinson's disease Mother    Cancer Father        Pancreatic   Cancer Sister        Lung   Diabetes Sister    Alzheimer's disease Paternal Aunt  x2 sisters   Diabetes Sister    Alzheimer's disease Sister    Tics Sister    Breast cancer Neg Hx      Current Outpatient Medications:    clobetasol (OLUX) 0.05 % topical foam, Apply topically 2 (two) times daily., Disp: 50 g, Rfl: 0   clobetasol ointment (TEMOVATE) [id number] %, Apply 1 application topically 2 (two) times daily as needed. For rash. Avoid face., Disp: 180 g, Rfl: 1   Cyanocobalamin (VITAMIN B 12 PO), Take by mouth., Disp: , Rfl:    folic acid (FOLVITE) 1 MG tablet, Take 1 mg by mouth daily., Disp: , Rfl:    ketoconazole (NIZORAL) 2 % cream, Apply 1 application topically daily as needed., Disp: , Rfl:    magnesium oxide (MAG-OX) 400 MG tablet, Take 1 tablet by mouth daily., Disp: , Rfl:    methotrexate (RHEUMATREX) 2.5 MG tablet, Take by mouth., Disp: , Rfl:    metoprolol tartrate (LOPRESSOR) 100 MG tablet, Take 1 tablet by mouth twice daily, Disp: 180 tablet, Rfl: 0   Multiple Vitamins-Minerals (PRESERVISION AREDS 2 PO), Take by mouth., Disp: , Rfl:    mupirocin ointment (BACTROBAN) 2 %, Place 1 application into the nose 2 (two) times daily., Disp: 22 g, Rfl: 1   Omega-3 Fatty Acids (OMEGA 3 500 PO), Take 1 capsule by mouth daily. ,  Disp: , Rfl:    pantoprazole (PROTONIX) 40 MG tablet, Take 1 tablet by mouth once daily, Disp: 90 tablet, Rfl: 0   potassium chloride (K-DUR) 10 MEQ tablet, Take 10 mEq by mouth daily., Disp: , Rfl:    PREMARIN vaginal cream, INSERT ONE APPLICATORFUL VAGINALLY TWICE WEEKLY, Disp: 90 g, Rfl: 2   simvastatin (ZOCOR) 40 MG tablet, TAKE 1 TABLET BY MOUTH ONCE DAILY AT  6PM (Patient taking differently: TAKE 1 TABLET BY MOUTH ONCE DAILY AT  6PM), Disp: 90 tablet, Rfl: 0   triamcinolone ointment (KENALOG) 0.1 %, Apply topically 2 (two) times daily., Disp: 240 g, Rfl: 0   Probiotic Product (PROBIOTIC DAILY) CAPS, Take 1 capsule by mouth. (Patient not taking: Reported on 02/28/2021), Disp: , Rfl:   No results found.  No images are attached to the encounter.   CMP Latest Ref Rng & Units 12/20/2020  Glucose 70 - 99 mg/dL 86  BUN 6 - 23 mg/dL 27(H)  Creatinine 0.40 - 1.20 mg/dL 0.83  Sodium 135 - 145 mEq/L 135  Potassium 3.5 - 5.1 mEq/L 4.6  Chloride 96 - 112 mEq/L 100  CO2 19 - 32 mEq/L 26  Calcium 8.4 - 10.5 mg/dL 9.0  Total Protein 6.0 - 8.3 g/dL 6.4  Total Bilirubin 0.2 - 1.2 mg/dL 0.6  Alkaline Phos 39 - 117 U/L 60  AST 0 - 37 U/L 21  ALT 0 - 35 U/L 12   CBC Latest Ref Rng & Units 02/25/2021  WBC 4.0 - 10.5 K/uL 6.5  Hemoglobin 12.0 - 15.0 g/dL 14.5  Hematocrit 36.0 - 46.0 % 44.6  Platelets 150 - 400 K/uL 219     Observation/objective: Appears in no acute distress over video visit today.  Breathing is nonlabored  Assessment and plan: Patient is a 75 year old female with iron deficiency anemia and this is a routine follow-up visit  Patient is not presently anemic with a hemoglobin of 14.5 Which is overall stable.  Her iron studies are remarkably improved from a ferritin of 14.5 367.  B12 level are elevated at 08/10/2006 and patient is currently on oral B12 supplementation.  Folate levels are normal.  She does not require any IV iron at this time  Follow-up instructions: Repeat CBC  ferritin and iron studies in 3 in 6 months and I will see her back in 6 months  I discussed the assessment and treatment plan with the patient. The patient was provided an opportunity to ask questions and all were answered. The patient agreed with the plan and demonstrated an understanding of the instructions.   The patient was advised to call back or seek an in-person evaluation if the symptoms worsen or if the condition fails to improve as anticipated.    Visit Diagnosis: 1. Iron deficiency     Dr. Randa Evens, MD, MPH [facility] at [facility] Tel- [phone] 02/28/2021 6:35 PM

## 2021-03-02 ENCOUNTER — Encounter: Payer: Self-pay | Admitting: Gastroenterology

## 2021-03-02 ENCOUNTER — Other Ambulatory Visit: Payer: Self-pay

## 2021-03-02 NOTE — Progress Notes (Signed)
PRINTED OUT LETTER FOR BOWEL PREP INSTRUCTIONS MAILED AND SENT TO MY CHART NO MEDS TO BE SENT TO PHARMACY

## 2021-03-04 NOTE — Telephone Encounter (Signed)
Error

## 2021-03-08 ENCOUNTER — Ambulatory Visit
Admission: RE | Admit: 2021-03-08 | Discharge: 2021-03-08 | Disposition: A | Discharge status: Home / Self Care | Payer: PPO | Attending: Gastroenterology | Admitting: Gastroenterology

## 2021-03-08 ENCOUNTER — Encounter
Admission: RE | Disposition: A | Discharge status: Home / Self Care | Payer: Self-pay | Source: Home / Self Care | Attending: Gastroenterology

## 2021-03-08 DIAGNOSIS — D509 Iron deficiency anemia, unspecified: Secondary | ICD-10-CM | POA: Insufficient documentation

## 2021-03-08 HISTORY — PX: GIVENS CAPSULE STUDY: SHX5432

## 2021-03-08 SURGERY — IMAGING PROCEDURE, GI TRACT, INTRALUMINAL, VIA CAPSULE

## 2021-03-09 ENCOUNTER — Encounter: Payer: Self-pay | Admitting: Gastroenterology

## 2021-03-23 DIAGNOSIS — M7671 Peroneal tendinitis, right leg: Secondary | ICD-10-CM | POA: Diagnosis not present

## 2021-03-23 DIAGNOSIS — M25571 Pain in right ankle and joints of right foot: Secondary | ICD-10-CM | POA: Diagnosis not present

## 2021-03-23 DIAGNOSIS — M25572 Pain in left ankle and joints of left foot: Secondary | ICD-10-CM | POA: Diagnosis not present

## 2021-03-23 DIAGNOSIS — M7672 Peroneal tendinitis, left leg: Secondary | ICD-10-CM | POA: Diagnosis not present

## 2021-03-23 DIAGNOSIS — G8929 Other chronic pain: Secondary | ICD-10-CM | POA: Diagnosis not present

## 2021-03-23 DIAGNOSIS — M6281 Muscle weakness (generalized): Secondary | ICD-10-CM | POA: Diagnosis not present

## 2021-03-31 DIAGNOSIS — Z79899 Other long term (current) drug therapy: Secondary | ICD-10-CM | POA: Diagnosis not present

## 2021-03-31 DIAGNOSIS — L409 Psoriasis, unspecified: Secondary | ICD-10-CM | POA: Diagnosis not present

## 2021-03-31 DIAGNOSIS — L4050 Arthropathic psoriasis, unspecified: Secondary | ICD-10-CM | POA: Diagnosis not present

## 2021-03-31 DIAGNOSIS — M170 Bilateral primary osteoarthritis of knee: Secondary | ICD-10-CM | POA: Diagnosis not present

## 2021-04-10 ENCOUNTER — Other Ambulatory Visit: Payer: Self-pay | Admitting: Family Medicine

## 2021-04-18 DIAGNOSIS — M2141 Flat foot [pes planus] (acquired), right foot: Secondary | ICD-10-CM | POA: Diagnosis not present

## 2021-04-18 DIAGNOSIS — M216X2 Other acquired deformities of left foot: Secondary | ICD-10-CM | POA: Diagnosis not present

## 2021-04-18 DIAGNOSIS — Q66221 Congenital metatarsus adductus, right foot: Secondary | ICD-10-CM | POA: Diagnosis not present

## 2021-04-18 DIAGNOSIS — M25372 Other instability, left ankle: Secondary | ICD-10-CM | POA: Diagnosis not present

## 2021-04-18 DIAGNOSIS — M2042 Other hammer toe(s) (acquired), left foot: Secondary | ICD-10-CM | POA: Diagnosis not present

## 2021-04-18 DIAGNOSIS — M25572 Pain in left ankle and joints of left foot: Secondary | ICD-10-CM | POA: Diagnosis not present

## 2021-04-18 DIAGNOSIS — M216X1 Other acquired deformities of right foot: Secondary | ICD-10-CM | POA: Diagnosis not present

## 2021-04-18 DIAGNOSIS — Q66222 Congenital metatarsus adductus, left foot: Secondary | ICD-10-CM | POA: Diagnosis not present

## 2021-04-18 DIAGNOSIS — M2041 Other hammer toe(s) (acquired), right foot: Secondary | ICD-10-CM | POA: Diagnosis not present

## 2021-04-18 DIAGNOSIS — M2011 Hallux valgus (acquired), right foot: Secondary | ICD-10-CM | POA: Diagnosis not present

## 2021-04-18 DIAGNOSIS — M2142 Flat foot [pes planus] (acquired), left foot: Secondary | ICD-10-CM | POA: Diagnosis not present

## 2021-04-18 DIAGNOSIS — M25371 Other instability, right ankle: Secondary | ICD-10-CM | POA: Diagnosis not present

## 2021-05-02 ENCOUNTER — Ambulatory Visit
Admission: RE | Admit: 2021-05-02 | Discharge: 2021-05-02 | Disposition: A | Discharge status: Home / Self Care | Payer: PPO | Source: Ambulatory Visit | Attending: Family Medicine | Admitting: Family Medicine

## 2021-05-02 ENCOUNTER — Other Ambulatory Visit: Payer: Self-pay

## 2021-05-02 DIAGNOSIS — Z1231 Encounter for screening mammogram for malignant neoplasm of breast: Secondary | ICD-10-CM | POA: Diagnosis not present

## 2021-05-17 ENCOUNTER — Ambulatory Visit (INDEPENDENT_AMBULATORY_CARE_PROVIDER_SITE_OTHER): Payer: PPO | Admitting: Gastroenterology

## 2021-05-17 ENCOUNTER — Encounter: Payer: Self-pay | Admitting: Gastroenterology

## 2021-05-17 VITALS — BP 168/91 | HR 60 | Temp 98.1°F | Wt 136.8 lb

## 2021-05-17 DIAGNOSIS — D509 Iron deficiency anemia, unspecified: Secondary | ICD-10-CM | POA: Diagnosis not present

## 2021-05-17 NOTE — Progress Notes (Signed)
[name] MD, MRCP(U.K) [address]  [place]  [place], [place] [address]  Main: [phone]  Fax: [phone]   Primary Care Physician: Leone Haven, MD  Primary Gastroenterologist:  Dr. [name]   C/c : Iron deficiency anemia   HPI: Lindsay Tran is a 75 y.o. female   Summary of history :  12/20/2020 CMP normal, ferritin 14, hemoglobin normal at 13.4 g/year back was 11.9 g.  Urine analysis shows no abnormalities B12 folate pending.  Last colonoscopy by Dr. Alice Reichert in November 2021 showed nonbleeding internal hemorrhoids a 4 mm polyp was found in the proximal colon which was resected.   She has received IV iron with hematology. She denies any nasal bleeds or hematemesis or melena.   She says she has had H. pylori in the past and has been treated 3 times.  She has had a hiatal hernia repair in the past.  She was known to have had gastric polyps and an ulcer in the esophagus.  Interval history   02/09/2021-05/17/2021  02/09/2021: H. pylori breath test and celiac serology negative 02/10/2021: EGD: Diverticulum seen in the second portion of the duodenum.  Otherwise erythema seen in the examined stomach.  Biopsies taken.  Biopsies of the duodenum showed no abnormalities.  Mild chronic gastritis seen in the stomach negative for intestinal metaplasia and H. pylori. 02/25/2021: B12, ferritin, folate normal.  Hemoglobin 14.5 g. 03/2021: Capsule study of the small bowel normal  Doing well after her last visit.  Denies any complaints.  Trying to lose weight intentionally and has lost 5 pounds.  Has some issues with constipation and diarrhea.  Current Outpatient Medications  Medication Sig Dispense Refill   clobetasol (OLUX) 0.05 % topical foam Apply topically 2 (two) times daily. 50 g 0   clobetasol ointment (TEMOVATE) 1.30 % Apply 1 application topically 2 (two) times daily as needed. For rash. Avoid face. 180 g 1   Cyanocobalamin (VITAMIN B 12 PO) Take by mouth.      folic acid (FOLVITE) 1 MG tablet Take 1 mg by mouth daily.     ketoconazole (NIZORAL) 2 % cream Apply 1 application topically daily as needed.     magnesium oxide (MAG-OX) 400 MG tablet Take 1 tablet by mouth daily.     methotrexate (RHEUMATREX) 2.5 MG tablet Take by mouth.     metoprolol tartrate (LOPRESSOR) 100 MG tablet Take 1 tablet by mouth twice daily 180 tablet 0   Multiple Vitamins-Minerals (PRESERVISION AREDS 2 PO) Take by mouth.     mupirocin ointment (BACTROBAN) 2 % Place 1 application into the nose 2 (two) times daily. 22 g 1   Omega-3 Fatty Acids (OMEGA 3 500 PO) Take 1 capsule by mouth daily.      pantoprazole (PROTONIX) 40 MG tablet Take 1 tablet by mouth once daily 90 tablet 0   potassium chloride (K-DUR) 10 MEQ tablet Take 10 mEq by mouth daily.     PREMARIN vaginal cream INSERT ONE APPLICATORFUL VAGINALLY TWICE WEEKLY 90 g 2   Probiotic Product (PROBIOTIC DAILY) CAPS Take 1 capsule by mouth.     simvastatin (ZOCOR) 40 MG tablet TAKE 1 TABLET BY MOUTH ONCE DAILY AT  6PM 90 tablet 0   triamcinolone ointment (KENALOG) 0.1 % Apply topically 2 (two) times daily. 240 g 0   No current facility-administered medications for this visit.    Allergies as of 05/17/2021 - Review Complete 05/17/2021  Allergen Reaction Noted   Gluten meal  Rash 12/20/2020    ROS:  General: Negative for anorexia, weight loss, fever, chills, fatigue, weakness. ENT: Negative for hoarseness, difficulty swallowing , nasal congestion. CV: Negative for chest pain, angina, palpitations, dyspnea on exertion, peripheral edema.  Respiratory: Negative for dyspnea at rest, dyspnea on exertion, cough, sputum, wheezing.  GI: See history of present illness. GU:  Negative for dysuria, hematuria, urinary incontinence, urinary frequency, nocturnal urination.  Endo: Negative for unusual weight change.    Physical Examination:   BP (!) 168/91   Pulse 60   Temp 98.1 F (36.7 C) (Oral)   Wt 136 lb 12.8 oz (62.1 kg)    BMI 26.72 kg/m   General: Well-nourished, well-developed in no acute distress.  Eyes: No icterus. Conjunctivae pink. Mouth: Oropharyngeal mucosa moist and pink , no lesions erythema or exudate. Abdomen: Bowel sounds are normal, nontender, nondistended, no hepatosplenomegaly or masses, no abdominal bruits or hernia , no rebound or guarding.   Extremities: No lower extremity edema. No clubbing or deformities. Neuro: Alert and oriented x 3.  Grossly intact. Skin: Warm and dry, no jaundice.   Psych: Alert and cooperative, normal mood and affect.   Imaging Studies: Mammogram 3D SCREEN BREAST BILATERAL  Result Date: 05/04/2021 CLINICAL DATA:  Screening. EXAM: DIGITAL SCREENING BILATERAL MAMMOGRAM WITH TOMOSYNTHESIS AND CAD TECHNIQUE: Bilateral screening digital craniocaudal and mediolateral oblique mammograms were obtained. Bilateral screening digital breast tomosynthesis was performed. The images were evaluated with computer-aided detection. COMPARISON:  Previous exam(s). ACR Breast Density Category b: There are scattered areas of fibroglandular density. FINDINGS: There are no findings suspicious for malignancy. IMPRESSION: No mammographic evidence of malignancy. A result letter of this screening mammogram will be mailed directly to the patient. RECOMMENDATION: Screening mammogram in one year. (Code:SM-B-01Y) BI-RADS CATEGORY  1: Negative. Electronically Signed   By: Ammie Ferrier M.D.   On: 05/04/2021 16:39   Assessment and Plan:   Lindsay Tran is a 75 y.o. y/o female here to follow up for for iron deficiency anemia.  She has had a colonoscopy in 2021 which showed no abnormalities.  GI evaluation including EGD and capsule study of small bowel showed no abnormalities except gastritis.  Celiac serology, H. pylori breath test negative.  No further GI rec work-up recommended at this point of time.  Avoid NSAIDs Has some issues with diarrhea and constipation.  Suggested a high-fiber diet with  an aim of 25 g of fiber per day.  Patient information provided.  Occasionally has constipation suggested to commence on MiraLAX sample which has been provided.   Dr [name]  MD,MRCP [facility]) Follow up in as needed

## 2021-06-09 ENCOUNTER — Other Ambulatory Visit: Payer: Self-pay | Admitting: *Deleted

## 2021-06-09 DIAGNOSIS — E611 Iron deficiency: Secondary | ICD-10-CM

## 2021-06-15 ENCOUNTER — Inpatient Hospital Stay: Payer: PPO | Attending: Oncology

## 2021-06-15 ENCOUNTER — Other Ambulatory Visit: Payer: Self-pay

## 2021-06-15 DIAGNOSIS — E611 Iron deficiency: Secondary | ICD-10-CM

## 2021-06-15 DIAGNOSIS — D509 Iron deficiency anemia, unspecified: Secondary | ICD-10-CM | POA: Diagnosis not present

## 2021-06-15 LAB — CBC WITH DIFFERENTIAL/PLATELET
Abs Immature Granulocytes: 0.02 10*3/uL (ref 0.00–0.07)
Basophils Absolute: 0 10*3/uL (ref 0.0–0.1)
Basophils Relative: 1 %
Eosinophils Absolute: 0.2 10*3/uL (ref 0.0–0.5)
Eosinophils Relative: 3 %
HCT: 43.6 % (ref 36.0–46.0)
Hemoglobin: 14.5 g/dL (ref 12.0–15.0)
Immature Granulocytes: 0 %
Lymphocytes Relative: 23 %
Lymphs Abs: 1.3 10*3/uL (ref 0.7–4.0)
MCH: 32.2 pg (ref 26.0–34.0)
MCHC: 33.3 g/dL (ref 30.0–36.0)
MCV: 96.9 fL (ref 80.0–100.0)
Monocytes Absolute: 0.6 10*3/uL (ref 0.1–1.0)
Monocytes Relative: 11 %
Neutro Abs: 3.7 10*3/uL (ref 1.7–7.7)
Neutrophils Relative %: 62 %
Platelets: 257 10*3/uL (ref 150–400)
RBC: 4.5 MIL/uL (ref 3.87–5.11)
RDW: 13.1 % (ref 11.5–15.5)
WBC: 5.8 10*3/uL (ref 4.0–10.5)
nRBC: 0 % (ref 0.0–0.2)

## 2021-06-15 LAB — IRON AND TIBC
Iron: 106 ug/dL (ref 28–170)
Saturation Ratios: 40 % — ABNORMAL HIGH (ref 10.4–31.8)
TIBC: 267 ug/dL (ref 250–450)
UIBC: 161 ug/dL

## 2021-06-15 LAB — FERRITIN: Ferritin: 342 ng/mL — ABNORMAL HIGH (ref 11–307)

## 2021-06-21 ENCOUNTER — Ambulatory Visit (INDEPENDENT_AMBULATORY_CARE_PROVIDER_SITE_OTHER): Payer: PPO | Admitting: Family Medicine

## 2021-06-21 ENCOUNTER — Other Ambulatory Visit: Payer: Self-pay

## 2021-06-21 ENCOUNTER — Encounter: Payer: Self-pay | Admitting: Family Medicine

## 2021-06-21 ENCOUNTER — Ambulatory Visit: Payer: PPO | Admitting: Dermatology

## 2021-06-21 VITALS — BP 140/80 | HR 63 | Temp 97.4°F | Ht 60.0 in | Wt 135.4 lb

## 2021-06-21 DIAGNOSIS — R1310 Dysphagia, unspecified: Secondary | ICD-10-CM

## 2021-06-21 DIAGNOSIS — F419 Anxiety disorder, unspecified: Secondary | ICD-10-CM | POA: Insufficient documentation

## 2021-06-21 DIAGNOSIS — I8393 Asymptomatic varicose veins of bilateral lower extremities: Secondary | ICD-10-CM | POA: Diagnosis not present

## 2021-06-21 DIAGNOSIS — L821 Other seborrheic keratosis: Secondary | ICD-10-CM

## 2021-06-21 DIAGNOSIS — R0989 Other specified symptoms and signs involving the circulatory and respiratory systems: Secondary | ICD-10-CM | POA: Diagnosis not present

## 2021-06-21 DIAGNOSIS — K219 Gastro-esophageal reflux disease without esophagitis: Secondary | ICD-10-CM

## 2021-06-21 DIAGNOSIS — L820 Inflamed seborrheic keratosis: Secondary | ICD-10-CM | POA: Diagnosis not present

## 2021-06-21 DIAGNOSIS — L4050 Arthropathic psoriasis, unspecified: Secondary | ICD-10-CM

## 2021-06-21 DIAGNOSIS — R09A2 Foreign body sensation, throat: Secondary | ICD-10-CM

## 2021-06-21 DIAGNOSIS — E611 Iron deficiency: Secondary | ICD-10-CM | POA: Diagnosis not present

## 2021-06-21 DIAGNOSIS — L409 Psoriasis, unspecified: Secondary | ICD-10-CM | POA: Diagnosis not present

## 2021-06-21 DIAGNOSIS — R143 Flatulence: Secondary | ICD-10-CM

## 2021-06-21 DIAGNOSIS — E785 Hyperlipidemia, unspecified: Secondary | ICD-10-CM | POA: Diagnosis not present

## 2021-06-21 DIAGNOSIS — L989 Disorder of the skin and subcutaneous tissue, unspecified: Secondary | ICD-10-CM | POA: Diagnosis not present

## 2021-06-21 DIAGNOSIS — I10 Essential (primary) hypertension: Secondary | ICD-10-CM

## 2021-06-21 DIAGNOSIS — H9313 Tinnitus, bilateral: Secondary | ICD-10-CM | POA: Diagnosis not present

## 2021-06-21 DIAGNOSIS — F32A Depression, unspecified: Secondary | ICD-10-CM

## 2021-06-21 MED ORDER — SERTRALINE HCL 50 MG PO TABS: ORAL_TABLET | ORAL | 3 refills | Status: DC

## 2021-06-21 MED ORDER — PANTOPRAZOLE SODIUM 40 MG PO TBEC: 40.0000 mg | DELAYED_RELEASE_TABLET | Freq: Every day | ORAL | 2 refills | Status: DC

## 2021-06-21 MED ORDER — METOPROLOL TARTRATE 100 MG PO TABS: 100.0000 mg | ORAL_TABLET | Freq: Two times a day (BID) | ORAL | 2 refills | Status: DC

## 2021-06-21 MED ORDER — SIMVASTATIN 40 MG PO TABS: ORAL_TABLET | ORAL | 2 refills | Status: DC

## 2021-06-21 MED ORDER — MUPIROCIN 2 % EX OINT: 1.0000 "application " | TOPICAL_OINTMENT | Freq: Two times a day (BID) | CUTANEOUS | 1 refills | Status: DC

## 2021-06-21 NOTE — Patient Instructions (Addendum)
Cryotherapy Aftercare  Wash gently with soap and water everyday.   Apply Vaseline and Band-Aid daily until healed.   Discussed treating psoriasis with Cosentyx vs Taltz   Continue TMC 0.1% twice daily as needed. Avoid applying to face, groin, and axilla. Use as directed. Long-term use can cause thinning of the skin.  Continue clobetasol twice daily as needed for flares. Avoid applying to face, groin, and axilla. Use as directed. Long-term use can cause thinning of the skin.  Topical steroids (such as triamcinolone, fluocinolone, fluocinonide, mometasone, clobetasol, halobetasol, betamethasone, hydrocortisone) can cause thinning and lightening of the skin if they are used for too long in the same area. Your physician has selected the right strength medicine for your problem and area affected on the body. Please use your medication only as directed by your physician to prevent side effects.   If You Need Anything After Your Visit  If you have any questions or concerns for your doctor, please call our main line at [phone] and press option 4 to reach your doctor's medical assistant. If no one answers, please leave a voicemail as directed and we will return your call as soon as possible. Messages left after 4 pm will be answered the following business day.   You may also send [place] a message via [place]. We typically respond to MyChart messages within 1-2 business days.  For prescription refills, please ask your pharmacy to contact our office. Our fax number is [phone].  If you have an urgent issue when the clinic is closed that cannot wait until the next business day, you can page your doctor at the number below.    Please note that while we do our best to be available for urgent issues outside of office hours, we are not available 24/7.   If you have an urgent issue and are unable to reach [place], you may choose to seek medical care at your doctor's office, retail clinic, urgent care  center, or emergency room.  If you have a medical emergency, please immediately call 911 or go to the emergency department.  Pager Numbers  - Dr. Nehemiah Massed: [phone]  - Dr. Laurence Ferrari: [phone]  - Dr. Nicole Kindred: [phone]  In the event of inclement weather, please call our main line at [phone] for an update on the status of any delays or closures.  Dermatology Medication Tips: Please keep the boxes that topical medications come in in order to help keep track of the instructions about where and how to use these. Pharmacies typically print the medication instructions only on the boxes and not directly on the medication tubes.   If your medication is too expensive, please contact our office at [phone] option 4 or send [place] a message through [place].   We are unable to tell what your co-pay for medications will be in advance as this is different depending on your insurance coverage. However, we may be able to find a substitute medication at lower cost or fill out paperwork to get insurance to cover a needed medication.   If a prior authorization is required to get your medication covered by your insurance company, please allow [place] 1-2 business days to complete this process.  Drug prices often vary depending on where the prescription is filled and some pharmacies may offer cheaper prices.  The website www.goodrx.com contains coupons for medications through different pharmacies. The prices here do not account for what the cost may be with help from insurance (it may be cheaper with your insurance),  but the website can give you the price if you did not use any insurance.  - You can print the associated coupon and take it with your prescription to the pharmacy.  - You may also stop by our office during regular business hours and pick up a GoodRx coupon card.  - If you need your prescription sent electronically to a different pharmacy, notify our office through [facility] or by  phone at [phone] option 4.     Si Usted Necesita Algo Despus de Su Visita  Tambin puede enviarnos un mensaje a travs de Pharmacist, community. Por lo general respondemos a los mensajes de MyChart en el transcurso de 1 a 2 das hbiles.  Para renovar recetas, por favor pida a su farmacia que se ponga en contacto con nuestra oficina. Harland Dingwall de fax es [place] [phone].  Si tiene un asunto urgente cuando la clnica est cerrada y que no puede esperar hasta el siguiente da hbil, puede llamar/localizar a su doctor(a) al nmero que aparece a continuacin.   Por favor, tenga en cuenta que aunque hacemos todo lo posible para estar disponibles para asuntos urgentes fuera del horario de [place], no estamos disponibles las 24 horas del da, los 7 das de la [place].   Si tiene un problema urgente y no puede comunicarse con nosotros, puede optar por buscar atencin mdica  en el consultorio de su doctor(a), en una clnica privada, en un centro de atencin urgente o en una sala de emergencias.  Si tiene Engineering geologist, por favor llame inmediatamente al 911 o vaya a la sala de emergencias.  Nmeros de bper  - Dr. Nehemiah Massed: [phone]  - Dra. Moye: [phone]  - Dra. Nicole Kindred: [phone]  En caso de inclemencias del [place], por favor llame a Johnsie Kindred principal al [phone] para una actualizacin sobre el [place] de cualquier retraso o cierre.  Consejos para la medicacin en dermatologa: Por favor, guarde las cajas en las que vienen los medicamentos de uso tpico para ayudarle a seguir las instrucciones sobre dnde y cmo usarlos. Las farmacias generalmente imprimen las instrucciones del medicamento slo en las cajas y no directamente en los tubos del [place].   Si su medicamento es muy caro, por favor, pngase en contacto con Zigmund Daniel llamando al [phone] y presione la opcin 4 o envenos un mensaje a travs de Pharmacist, community.   No podemos decirle cul ser su copago  por los medicamentos por adelantado ya que esto es diferente dependiendo de la cobertura de su seguro. Sin embargo, es posible que podamos encontrar un medicamento sustituto a Electrical engineer un formulario para que el seguro cubra el medicamento que se considera necesario.   Si se requiere una autorizacin previa para que su compaa de seguros [place] su medicamento, por favor permtanos de 1 a 2 das hbiles para completar este proceso.  Los precios de los medicamentos varan con frecuencia dependiendo del Environmental consultant de dnde se surte la receta y alguna farmacias pueden ofrecer precios ms baratos.  El sitio web www.goodrx.com tiene cupones para medicamentos de Airline pilot. Los precios aqu no tienen en cuenta lo que podra costar con la ayuda del seguro (puede ser ms barato con su seguro), pero el sitio web puede darle el precio si no utiliz Research scientist (physical sciences).  - Puede imprimir el cupn correspondiente y llevarlo con su receta a la farmacia.  - Tambin puede pasar por nuestra oficina durante el horario de atencin regular y Charity fundraiser una tarjeta de cupones  de GoodRx.  - Si necesita que su receta se enve electrnicamente a una farmacia diferente, informe a nuestra oficina a travs de MyChart de [place] o por telfono llamando al [phone] y presione la opcin 4.

## 2021-06-21 NOTE — Assessment & Plan Note (Signed)
The patient will continue simvastatin 40 mg once daily.  We will plan for labs in 1 month.

## 2021-06-21 NOTE — Assessment & Plan Note (Signed)
Bilateral tinnitus.  Discussed this is likely related to her prior hearing loss.  Advised monitoring and if it worsens or if she notices any hearing changes she should contact ENT to schedule an evaluation.

## 2021-06-21 NOTE — Assessment & Plan Note (Signed)
This is an ongoing issue.  She will continue to follow the FODMAP diet.  I will refer her to a different GI physician at her request for second opinion on this issue.

## 2021-06-21 NOTE — Assessment & Plan Note (Signed)
This is an ongoing issue.  We will start her on Zoloft 25 mg once daily for 14 days and then she will increase to 50 mg once daily.  She will monitor for weight gain and sleeping issues.  If those things occur she will let us know.

## 2021-06-21 NOTE — Assessment & Plan Note (Signed)
She reports no cause was found with ENT.  This could be related to tonsil stones though her tonsils are not overly evident on exam today.  She will monitor at this time.

## 2021-06-21 NOTE — Progress Notes (Signed)
Tommi Rumps, MD Phone: [phone]  Lindsay Tran is a 75 y.o. female who presents today for follow-up.f/u.  HYPERTENSION Disease Monitoring Home BP Monitoring 120s/70s Chest pain- no    Dyspnea- no Medications Compliance-  taking metoprolol.  Edema- ankles, chronic, stable BMET    Component Value Date/Time   NA 135 12/20/2020 1140   K 4.6 12/20/2020 1140   CL 100 12/20/2020 1140   CO2 26 12/20/2020 1140   GLUCOSE 86 12/20/2020 1140   BUN 27 (H) 12/20/2020 1140   CREATININE 0.83 12/20/2020 1140   CALCIUM 9.0 12/20/2020 1140   HYPERLIPIDEMIA Symptoms Chest pain on exertion:  no   Medications: Compliance- taking simvastatin Right upper quadrant pain- no  Muscle aches- no Lipid Panel     Component Value Date/Time   CHOL 126 07/22/2020 1029   TRIG 83.0 07/22/2020 1029   HDL 63.30 07/22/2020 1029   CHOLHDL 2 07/22/2020 1029   VLDL 16.6 07/22/2020 1029   LDLCALC 46 07/22/2020 1029   GERD:   Reflux symptoms: no   Abd pain: right sided    Blood in stool: no  Dysphagia: chronic, stable since hernia surgery   EGD: 02/10/21 with diffuse stomach inflammation, duodenal diverticulum  Medication: Protonix  Excessive gas: Patient reports chronic intermittent issues with excessive gas that resolved after taking antibiotics for something else.  She notes her gas smells spicy.  She alternates between constipation and diarrhea.  She has occasional right-sided abdominal discomfort.  Globus sensation: She saw ENT and notes they did not find a cause.  She feels it is related to tonsil stones as she finds stones at times and gets them out and this feels better.  Skin lesion: This is on her upper back.  This has been present for 6 months.  It has not been improving.  Tinnitus: She has bilateral chronic tinnitus though it has recently worsened.  She notes no significant hearing changes.  She is not on aspirin.  She notes she has had her hearing tested through ENT  previously.  Anxiety/depression: Patient notes chronic issues with those things.  She has trouble leaving the house at times relating to the anxiety.  She notes no SI or HI.  She was on Cymbalta through rheumatology though she had significant trouble sleeping and notes she discontinued this medication.   Social History   Tobacco Use  Smoking Status Never  Smokeless Tobacco Never    Current Outpatient Medications on File Prior to Visit  Medication Sig Dispense Refill   clobetasol (OLUX) 0.05 % topical foam Apply topically 2 (two) times daily. 50 g 0   clobetasol ointment (TEMOVATE) 0.08 % Apply 1 application topically 2 (two) times daily as needed. For rash. Avoid face. 180 g 1   Cyanocobalamin (VITAMIN B 12 PO) Take by mouth.     folic acid (FOLVITE) 1 MG tablet Take 1 mg by mouth daily.     magnesium oxide (MAG-OX) 400 MG tablet Take 1 tablet by mouth daily.     methotrexate (RHEUMATREX) 2.5 MG tablet Take by mouth.     Multiple Vitamins-Minerals (PRESERVISION AREDS 2 PO) Take by mouth.     Omega-3 Fatty Acids (OMEGA 3 500 PO) Take 1 capsule by mouth daily.      potassium chloride (K-DUR) 10 MEQ tablet Take 10 mEq by mouth daily.     PREMARIN vaginal cream INSERT ONE APPLICATORFUL VAGINALLY TWICE WEEKLY 90 g 2   Probiotic Product (PROBIOTIC DAILY) CAPS Take 1 capsule by mouth.  triamcinolone ointment (KENALOG) 0.1 % Apply topically 2 (two) times daily. 240 g 0   No current facility-administered medications on file prior to visit.     ROS see history of present illness  Objective  Physical Exam Vitals:   06/21/21 1115  BP: 140/80  Pulse: 63  Temp: (!) 97.4 F (36.3 C)  SpO2: 97%    BP Readings from Last 3 Encounters:  06/21/21 140/80  05/17/21 (!) 168/91  02/10/21 140/76   Wt Readings from Last 3 Encounters:  06/21/21 135 lb 6.4 oz (61.4 kg)  05/17/21 136 lb 12.8 oz (62.1 kg)  02/10/21 139 lb (63 kg)    Physical Exam Constitutional:      General: She is  not in acute distress.    Appearance: She is not diaphoretic.  HENT:     Right Ear: Tympanic membrane and ear canal normal.     Left Ear: Tympanic membrane and ear canal normal.     Mouth/Throat:     Mouth: Mucous membranes are moist.     Pharynx: Oropharynx is clear. No oropharyngeal exudate.     Comments: No enlargement of her tonsils Cardiovascular:     Rate and Rhythm: Normal rate and regular rhythm.     Heart sounds: Normal heart sounds.  Pulmonary:     Effort: Pulmonary effort is normal.     Breath sounds: Normal breath sounds.  Abdominal:     General: Bowel sounds are normal. There is no distension.     Palpations: Abdomen is soft.     Tenderness: There is no abdominal tenderness. There is no guarding or rebound.  Skin:    General: Skin is warm and dry.  Neurological:     Mental Status: She is alert.    Central upper back.   Assessment/Plan: Please see individual problem list.  Problem List Items Addressed This Visit     Acid reflux    Generally stable.  She will continue Protonix 40 mg daily.      Relevant Medications   pantoprazole (PROTONIX) 40 MG tablet   Anxiety and depression    This is an ongoing issue.  We will start her on Zoloft 25 mg once daily for 14 days and then she will increase to 50 mg once daily.  She will monitor for weight gain and sleeping issues.  If those things occur she will let us know.      Relevant Medications   sertraline (ZOLOFT) 50 MG tablet   Benign essential HTN    Well-controlled at home.  She will continue metoprolol 100 mg twice daily.      Relevant Medications   metoprolol tartrate (LOPRESSOR) 100 MG tablet   simvastatin (ZOCOR) 40 MG tablet   Dysphagia    Stable.  Chronic issue.  She will continue Protonix.  She will see a new GI physician at her request.      Excessive gas    This is an ongoing issue.  She will continue to follow the FODMAP diet.  I will refer her to a different GI physician at her request for second  opinion on this issue.      Relevant Orders   Ambulatory referral to Gastroenterology   Globus sensation    She reports no cause was found with ENT.  This could be related to tonsil stones though her tonsils are not overly evident on exam today.  She will monitor at this time.      Hyperlipidemia - Primary  The patient will continue simvastatin 40 mg once daily.  We will plan for labs in 1 month.      Relevant Medications   metoprolol tartrate (LOPRESSOR) 100 MG tablet   simvastatin (ZOCOR) 40 MG tablet   Other Relevant Orders   Comp Met (CMET)   Lipid panel   Iron deficiency    The patient has had an extensive work-up with no obvious cause for her iron deficiency.  This may be an absorption issue.  She will continue to see hematology for this.      Skin lesion    Discussed this could represent a basal cell carcinoma.  We will refer her back to dermatology.  I will send my note to her dermatologist with the picture of the lesion to assist in how quickly the patient needs to be seen.      Relevant Orders   Ambulatory referral to Dermatology   Tinnitus aurium, bilateral    Bilateral tinnitus.  Discussed this is likely related to her prior hearing loss.  Advised monitoring and if it worsens or if she notices any hearing changes she should contact ENT to schedule an evaluation.        Return in about 6 weeks (around 08/02/2021) for Labs, 41-month follow-up with PCP.  This visit occurred during the SARS-CoV-2 public health emergency.  Safety protocols were in place, including screening questions prior to the visit, additional usage of staff PPE, and extensive cleaning of exam room while observing appropriate contact time as indicated for disinfecting solutions.   I have spent 45 minutes in the care of this patient regarding History taking, documentation, completion of exam, discussion of plan, placing orders.   Tommi Rumps, MD [place]

## 2021-06-21 NOTE — Progress Notes (Signed)
New Patient Visit  Subjective  Lindsay Tran is a 75 y.o. female who presents for the following: Skin Problem (Patient here today for a dry spot at back, present for about 6 months. Patient advises it does itch sometimes. Patient has tried to treat with her clobetasol and TMC 0.1% ointment for her psoriasis but the spot is still there. ).  Patient is being followed by a rheumatologist for psoriatic arthritis and is on MTX. She is currently taking 5 pills weekly. She has been getting more psoriasis on her skin lately.  The following portions of the chart were reviewed this encounter and updated as appropriate:       Review of Systems:  No other skin or systemic complaints except as noted in HPI or Assessment and Plan.  Objective  Well appearing patient in no apparent distress; mood and affect are within normal limits.  A focused examination was performed including back, arms, hands. Relevant physical exam findings are noted in the Assessment and Plan.  Right thenar hand x 3, right wrist x 1, spinal upper back x 1 (5) Erythematous keratotic waxy stuck-on papule   Right Elbow, back Pink scaly papules at right arm, right elbow, right upper back, right thigh, lower back   Assessment & Plan  Inflamed seborrheic keratosis Right thenar hand x 3, right wrist x 1, spinal upper back x 1  Destruction of lesion - Right thenar hand x 3, right wrist x 1, spinal upper back x 1  Destruction method: cryotherapy   Informed consent: discussed and consent obtained   Lesion destroyed using liquid nitrogen: Yes   Region frozen until ice ball extended beyond lesion: Yes   Outcome: patient tolerated procedure well with no complications   Post-procedure details: wound care instructions given   Additional details:  Prior to procedure, discussed risks of blister formation, small wound, skin dyspigmentation, or rare scar following cryotherapy. Recommend Vaseline ointment to treated areas while healing.    Psoriasis Right Elbow, back  With PsA, on MTX, mild/mod skin flare  Psoriasis is a chronic non-curable, but treatable genetic/hereditary disease that may have other systemic features affecting other organ systems such as joints (Psoriatic Arthritis). It is associated with an increased risk of inflammatory bowel disease, heart disease, non-alcoholic fatty liver disease, and depression.    Discussed treatment options with Taltz vs Cosentyx which has better efficacy for skin than MTX, and also treats arthritis.  She would need to address with her rheumatologist  Continue TMC 0.1% twice daily as needed. Avoid applying to face, groin, and axilla. Use as directed. Long-term use can cause thinning of the skin.  Continue clobetasol twice daily more severely affected areas as needed for flares. Avoid applying to face, groin, and axilla. Use as directed. Long-term use can cause thinning of the skin.  Topical steroids (such as triamcinolone, fluocinolone, fluocinonide, mometasone, clobetasol, halobetasol, betamethasone, hydrocortisone) can cause thinning and lightening of the skin if they are used for too long in the same area. Your physician has selected the right strength medicine for your problem and area affected on the body. Please use your medication only as directed by your physician to prevent side effects.    Related Medications triamcinolone ointment (KENALOG) 0.1 % Apply topically 2 (two) times daily.  Seborrheic Keratoses - Stuck-on, waxy, tan-brown papules and/or plaques  - Benign-appearing - Discussed benign etiology and prognosis. - Observe - Call for any changes  Varicose Veins/Spider Veins - Dilated blue, purple or red veins at  the lower extremities - Reassured - Smaller vessels can be treated by sclerotherapy (a procedure to inject a medicine into the veins to make them disappear) if desired, but the treatment is not covered by insurance. Larger vessels may be covered if  symptomatic and we would refer to vascular surgeon if treatment desired.  Return in about 2 months (around 08/22/2021) for ISK follow up.  Graciella Belton, RMA, am acting as scribe for Brendolyn Patty, MD .  Documentation: I have reviewed the above documentation for accuracy and completeness, and I agree with the above.  Brendolyn Patty MD

## 2021-06-21 NOTE — Patient Instructions (Signed)
Nice to see you. We will get you to see dermatology and GI. We will try to find out your pneumonia vaccine status. Please start on the Zoloft.  If you notice any sleeping issues or weight gain please let us know.

## 2021-06-21 NOTE — Assessment & Plan Note (Addendum)
The patient has had an extensive work-up with no obvious cause for her iron deficiency.  This may be an absorption issue.  She will continue to see hematology for this.

## 2021-06-21 NOTE — Assessment & Plan Note (Signed)
Stable.  Chronic issue.  She will continue Protonix.  She will see a new GI physician at her request.

## 2021-06-21 NOTE — Assessment & Plan Note (Signed)
Discussed this could represent a basal cell carcinoma.  We will refer her back to dermatology.  I will send my note to her dermatologist with the picture of the lesion to assist in how quickly the patient needs to be seen.

## 2021-06-21 NOTE — Addendum Note (Signed)
Addended by: Leeanne Rio on: 06/21/2021 12:09 PM   Modules accepted: Orders

## 2021-06-21 NOTE — Assessment & Plan Note (Signed)
Generally stable.  She will continue Protonix 40 mg daily.

## 2021-06-21 NOTE — Assessment & Plan Note (Signed)
Well-controlled at home.  She will continue metoprolol 100 mg twice daily.

## 2021-06-22 NOTE — Addendum Note (Signed)
Addended by: Brendolyn Patty on: 06/22/2021 08:32 AM   Modules accepted: Level of Service

## 2021-06-30 DIAGNOSIS — L4050 Arthropathic psoriasis, unspecified: Secondary | ICD-10-CM | POA: Diagnosis not present

## 2021-06-30 DIAGNOSIS — M170 Bilateral primary osteoarthritis of knee: Secondary | ICD-10-CM | POA: Diagnosis not present

## 2021-06-30 DIAGNOSIS — L409 Psoriasis, unspecified: Secondary | ICD-10-CM | POA: Diagnosis not present

## 2021-06-30 DIAGNOSIS — M7061 Trochanteric bursitis, right hip: Secondary | ICD-10-CM | POA: Diagnosis not present

## 2021-07-05 NOTE — Progress Notes (Signed)
I called to Dr. Laurelyn Sickle office and the patient had a Prevnar 23, 06/15/2010.  That's the only pneumonia she received there.  Henna Derderian,cma

## 2021-07-12 DIAGNOSIS — H353131 Nonexudative age-related macular degeneration, bilateral, early dry stage: Secondary | ICD-10-CM | POA: Diagnosis not present

## 2021-07-12 DIAGNOSIS — H2513 Age-related nuclear cataract, bilateral: Secondary | ICD-10-CM | POA: Diagnosis not present

## 2021-07-21 DIAGNOSIS — R140 Abdominal distension (gaseous): Secondary | ICD-10-CM | POA: Insufficient documentation

## 2021-07-21 DIAGNOSIS — K580 Irritable bowel syndrome with diarrhea: Secondary | ICD-10-CM | POA: Diagnosis not present

## 2021-07-21 DIAGNOSIS — K219 Gastro-esophageal reflux disease without esophagitis: Secondary | ICD-10-CM | POA: Diagnosis not present

## 2021-08-04 ENCOUNTER — Telehealth: Payer: Self-pay | Admitting: Family Medicine

## 2021-08-04 ENCOUNTER — Other Ambulatory Visit (INDEPENDENT_AMBULATORY_CARE_PROVIDER_SITE_OTHER): Payer: PPO

## 2021-08-04 ENCOUNTER — Other Ambulatory Visit: Payer: Self-pay

## 2021-08-04 DIAGNOSIS — E785 Hyperlipidemia, unspecified: Secondary | ICD-10-CM | POA: Diagnosis not present

## 2021-08-04 LAB — LIPID PANEL
Cholesterol: 153 mg/dL (ref 0–200)
HDL: 60.8 mg/dL (ref >39.00)
LDL Cholesterol: 66 mg/dL (ref 0–99)
NonHDL: 91.89
Total CHOL/HDL Ratio: 3
Triglycerides: 131 mg/dL (ref 0.0–149.0)
VLDL: 26.2 mg/dL (ref 0.0–40.0)

## 2021-08-04 LAB — COMPREHENSIVE METABOLIC PANEL
ALT: 14 U/L (ref 0–35)
AST: 20 U/L (ref 0–37)
Albumin: 4.2 g/dL (ref 3.5–5.2)
Alkaline Phosphatase: 53 U/L (ref 39–117)
BUN: 13 mg/dL (ref 6–23)
CO2: 31 mEq/L (ref 19–32)
Calcium: 9.4 mg/dL (ref 8.4–10.5)
Chloride: 100 mEq/L (ref 96–112)
Creatinine, Ser: 0.69 mg/dL (ref 0.40–1.20)
GFR: 83.39 mL/min (ref >60.00)
Glucose, Bld: 89 mg/dL (ref 70–99)
Potassium: 4.5 mEq/L (ref 3.5–5.1)
Sodium: 138 mEq/L (ref 135–145)
Total Bilirubin: 0.6 mg/dL (ref 0.2–1.2)
Total Protein: 6.7 g/dL (ref 6.0–8.3)

## 2021-08-04 NOTE — Telephone Encounter (Signed)
pt came into office stating the medication sertraline is prescribed wrong. Pt stated she does not think she should be going from whole pill to half pill back to whole pill.

## 2021-08-05 MED ORDER — SERTRALINE HCL 50 MG PO TABS: 50.0000 mg | ORAL_TABLET | Freq: Every day | ORAL | 3 refills | Status: DC

## 2021-08-05 NOTE — Telephone Encounter (Signed)
She should just be taking 1 tablet of the sertraline daily.  I updated the prescription.

## 2021-08-05 NOTE — Telephone Encounter (Signed)
I called and informed the patient that her rx was updated and sent to the pharmacy and she understood.  Lindsay Tran,cma

## 2021-08-10 ENCOUNTER — Encounter (INDEPENDENT_AMBULATORY_CARE_PROVIDER_SITE_OTHER): Payer: PPO | Admitting: Nurse Practitioner

## 2021-08-12 ENCOUNTER — Other Ambulatory Visit: Payer: Self-pay

## 2021-08-12 ENCOUNTER — Encounter (INDEPENDENT_AMBULATORY_CARE_PROVIDER_SITE_OTHER): Payer: Self-pay | Admitting: Nurse Practitioner

## 2021-08-12 ENCOUNTER — Ambulatory Visit (INDEPENDENT_AMBULATORY_CARE_PROVIDER_SITE_OTHER): Payer: PPO | Admitting: Nurse Practitioner

## 2021-08-12 VITALS — BP 131/82 | HR 55 | Resp 15 | Wt 132.4 lb

## 2021-08-12 DIAGNOSIS — I83812 Varicose veins of left lower extremities with pain: Secondary | ICD-10-CM | POA: Diagnosis not present

## 2021-08-12 DIAGNOSIS — M1990 Unspecified osteoarthritis, unspecified site: Secondary | ICD-10-CM

## 2021-08-13 ENCOUNTER — Encounter (INDEPENDENT_AMBULATORY_CARE_PROVIDER_SITE_OTHER): Payer: Self-pay | Admitting: Nurse Practitioner

## 2021-08-13 NOTE — Progress Notes (Signed)
Subjective:    Patient ID: Lindsay Tran, female    DOB: Jan 03, 1946, 76 y.o.   MRN: [id number] Chief Complaint  Patient presents with   Follow-up    Consult for bilateral varicose veins    [name] is a 76 year old female that returns today for evaluation of varicose veins.  The patient has previously had treatment of her left great saphenous vein in 2020.  She underwent endovenous laser ablation with subsequent sclerotherapy.  Recently the patient has been having some soreness in her ankle area as well as increased varicosities.  Suggested by her podiatrist that she have a reevaluation of her varicose veins.  She also notes that she gets cortisone shots within her ankle and that does help the pain for a short period of time.  There are no open wounds or ulcerations.  The patient also notes that she has continued to wear medical grade compression stockings as well as elevating her legs as well.   Review of Systems  Musculoskeletal:  Positive for arthralgias.  All other systems reviewed and are negative.     Objective:   Physical Exam Vitals reviewed.  HENT:     Head: Normocephalic.  Cardiovascular:     Rate and Rhythm: Normal rate.     Pulses: Normal pulses.  Pulmonary:     Effort: Pulmonary effort is normal.  Musculoskeletal:        General: No swelling.  Skin:    General: Skin is warm and dry.     Capillary Refill: Capillary refill takes less than 2 seconds.  Neurological:     Mental Status: She is alert and oriented to person, place, and time.  Psychiatric:        Mood and Affect: Mood normal.        Behavior: Behavior normal.        Thought Content: Thought content normal.        Judgment: Judgment normal.    BP 131/82 (BP Location: Left Arm)    Pulse (!) 55    Resp 15    Wt 132 lb 6.4 oz (60.1 kg)    BMI 25.86 kg/m   Past Medical History:  Diagnosis Date   Abdominal pain    Adjustment disorder with anxiety    Anemia    Anxiety    adjustment disorder with  anxiety   Arthritis    Atrophic vaginitis    BPPV (benign paroxysmal positional vertigo)    CAD in native artery    Cataract    Chronic diarrhea    Chronic pain of right knee    Coronary artery disease    Dysphagia    Dysphasia    Dysuria    Family history of colon cancer    Gastric ulcer 1980's   Gastric ulcer    Gastroesophageal hernia    GERD (gastroesophageal reflux disease)    High cholesterol    Hyperlipidemia    Hypertension    Macular degeneration    Osteoporosis    Osteoporosis    post-menopausal   Pedal edema    Psoriasis    Schatzki's ring 05/02/2013   Schatzki's ring    Scoliosis    Scoliosis    UTI (urinary tract infection)     Social History   Socioeconomic History   Marital status: Single    Spouse name: Not on file   Number of children: Not on file   Years of education: Not on file   Highest  education level: Not on file  Occupational History   Not on file  Tobacco Use   Smoking status: Never   Smokeless tobacco: Never  Vaping Use   Vaping Use: Never used  Substance and Sexual Activity   Alcohol use: No    Alcohol/week: 0.0 standard drinks   Drug use: No   Sexual activity: Never  Other Topics Concern   Not on file  Social History Narrative   Divorced   Retired as a Scientist, research (medical)   12 + years of school    Social Determinants of Radio broadcast assistant Strain: Low Risk    Difficulty of Paying Living Expenses: Not hard at all  Food Insecurity: No Food Insecurity   Worried About Charity fundraiser in the Last Year: Never true   Arboriculturist in the Last Year: Never true  Transportation Needs: No Transportation Needs   Lack of Transportation (Medical): No   Lack of Transportation (Non-Medical): No  Physical Activity: Not on file  Stress: No Stress Concern Present   Feeling of Stress : Not at all  Social Connections: Unknown   Frequency of Communication with Friends and Family: More than three times a week   Frequency of  Social Gatherings with Friends and Family: More than three times a week   Attends Religious Services: Not on file   Active Member of Clubs or Organizations: Not on file   Attends Archivist Meetings: Not on file   Marital Status: Not on file  Intimate Partner Violence: Not At Risk   Fear of Current or Ex-Partner: No   Emotionally Abused: No   Physically Abused: No   Sexually Abused: No    Past Surgical History:  Procedure Laterality Date   ABDOMINAL HYSTERECTOMY     CARDIAC CATHETERIZATION     CHOLECYSTECTOMY     COLONOSCOPY     COLONOSCOPY N/A 11/12/2014   Procedure: COLONOSCOPY;  Surgeon: Manya Silvas, MD;  Location: [facility];  Service: Endoscopy;  Laterality: N/A;   COLONOSCOPY N/A 05/24/2020   Procedure: COLONOSCOPY;  Surgeon: Toledo, Benay Pike, MD;  Location: [facility] ENDOSCOPY;  Service: Endoscopy;  Laterality: N/A;   DIAGNOSTIC LAPAROSCOPY     paraesphogeal hernia   ESOPHAGOGASTRODUODENOSCOPY     ESOPHAGOGASTRODUODENOSCOPY (EGD) WITH PROPOFOL N/A 04/16/2017   Procedure: ESOPHAGOGASTRODUODENOSCOPY (EGD) WITH PROPOFOL;  Surgeon: Manya Silvas, MD;  Location: [facility] ENDOSCOPY;  Service: Endoscopy;  Laterality: N/A;   ESOPHAGOGASTRODUODENOSCOPY (EGD) WITH PROPOFOL N/A 02/10/2021   Procedure: ESOPHAGOGASTRODUODENOSCOPY (EGD) WITH PROPOFOL;  Surgeon: [name], MD;  Location: [facility] ENDOSCOPY;  Service: Gastroenterology;  Laterality: N/A;   GIVENS CAPSULE STUDY N/A 03/08/2021   Procedure: GIVENS CAPSULE STUDY;  Surgeon: [name], MD;  Location: [facility] ENDOSCOPY;  Service: Gastroenterology;  Laterality: N/A;   HERNIA REPAIR     inguinal hernia   HERNIA REPAIR     umbilical hernia    Family History  Problem Relation Age of Onset   Diabetes Mother    Parkinson's disease Mother    Cancer Father        Pancreatic   Cancer Sister        Lung   Diabetes Sister    Alzheimer's disease Paternal Aunt        x2 sisters   Diabetes Sister    Alzheimer's disease Sister     Tics Sister    Breast cancer Neg Hx     Allergies  Allergen Reactions   Gluten Meal  Rash    CBC Latest Ref Rng & Units 06/15/2021 02/25/2021 12/20/2020  WBC 4.0 - 10.5 K/uL 5.8 6.5 5.3  Hemoglobin 12.0 - 15.0 g/dL 14.5 14.5 13.4  Hematocrit 36.0 - 46.0 % 43.6 44.6 41.0  Platelets 150 - 400 K/uL 257 219 238.0      CMP     Component Value Date/Time   NA 138 08/04/2021 0959   K 4.5 08/04/2021 0959   CL 100 08/04/2021 0959   CO2 31 08/04/2021 0959   GLUCOSE 89 08/04/2021 0959   BUN 13 08/04/2021 0959   CREATININE 0.69 08/04/2021 0959   CALCIUM 9.4 08/04/2021 0959   PROT 6.7 08/04/2021 0959   ALBUMIN 4.2 08/04/2021 0959   AST 20 08/04/2021 0959   ALT 14 08/04/2021 0959   ALKPHOS 53 08/04/2021 0959   BILITOT 0.6 08/04/2021 0959     No results found.     Assessment & Plan:   1. Varicose veins of left lower extremity with pain The patient has previously had treatment for varicosities suggested that her continued ankle soreness and pain could be related to the residual varicosities.  That certainly a possibility.  We will undergo a venous reflux study to ensure there has been no recurrence of any reflux since her endovenous laser ablation in 2020.  2. Arthritis This may be a component of the patient's ankle pain.    Current Outpatient Medications on File Prior to Visit  Medication Sig Dispense Refill   clobetasol (OLUX) 0.05 % topical foam Apply topically 2 (two) times daily. 50 g 0   clobetasol ointment (TEMOVATE) 8.14 % Apply 1 application topically 2 (two) times daily as needed. For rash. Avoid face. 180 g 1   Cyanocobalamin (VITAMIN B 12 PO) Take by mouth.     folic acid (FOLVITE) 1 MG tablet Take 1 mg by mouth daily.     magnesium oxide (MAG-OX) 400 MG tablet Take 1 tablet by mouth daily.     methotrexate (RHEUMATREX) 2.5 MG tablet Take by mouth.     metoprolol tartrate (LOPRESSOR) 100 MG tablet Take 1 tablet (100 mg total) by mouth 2 (two) times daily. 180  tablet 2   Multiple Vitamins-Minerals (PRESERVISION AREDS 2 PO) Take by mouth.     mupirocin ointment (BACTROBAN) 2 % Place 1 application into the nose 2 (two) times daily. 22 g 1   Omega-3 Fatty Acids (OMEGA 3 500 PO) Take 1 capsule by mouth daily.      pantoprazole (PROTONIX) 40 MG tablet Take 1 tablet (40 mg total) by mouth daily. 90 tablet 2   potassium chloride (K-DUR) 10 MEQ tablet Take 10 mEq by mouth daily.     PREMARIN vaginal cream INSERT ONE APPLICATORFUL VAGINALLY TWICE WEEKLY 90 g 2   Probiotic Product (PROBIOTIC DAILY) CAPS Take 1 capsule by mouth.     sertraline (ZOLOFT) 50 MG tablet Take 1 tablet (50 mg total) by mouth daily. 90 tablet 3   simvastatin (ZOCOR) 40 MG tablet TAKE 1 TABLET BY MOUTH ONCE DAILY AT  6PM 90 tablet 2   triamcinolone ointment (KENALOG) 0.1 % Apply topically 2 (two) times daily. 240 g 0   No current facility-administered medications on file prior to visit.    There are no Patient Instructions on file for this visit. No follow-ups on file.   Kris Hartmann, NP

## 2021-08-22 ENCOUNTER — Ambulatory Visit: Payer: PPO | Admitting: Dermatology

## 2021-08-22 ENCOUNTER — Other Ambulatory Visit: Payer: Self-pay

## 2021-08-22 DIAGNOSIS — L905 Scar conditions and fibrosis of skin: Secondary | ICD-10-CM | POA: Diagnosis not present

## 2021-08-22 DIAGNOSIS — L578 Other skin changes due to chronic exposure to nonionizing radiation: Secondary | ICD-10-CM

## 2021-08-22 DIAGNOSIS — L821 Other seborrheic keratosis: Secondary | ICD-10-CM | POA: Diagnosis not present

## 2021-08-22 DIAGNOSIS — L4050 Arthropathic psoriasis, unspecified: Secondary | ICD-10-CM | POA: Diagnosis not present

## 2021-08-22 DIAGNOSIS — L820 Inflamed seborrheic keratosis: Secondary | ICD-10-CM

## 2021-08-22 DIAGNOSIS — L409 Psoriasis, unspecified: Secondary | ICD-10-CM

## 2021-08-22 DIAGNOSIS — B009 Herpesviral infection, unspecified: Secondary | ICD-10-CM | POA: Diagnosis not present

## 2021-08-22 MED ORDER — VALACYCLOVIR HCL 500 MG PO TABS: 500.0000 mg | ORAL_TABLET | Freq: Two times a day (BID) | ORAL | 2 refills | Status: DC

## 2021-08-22 NOTE — Patient Instructions (Addendum)
Cryotherapy Aftercare  Wash gently with soap and water everyday.   Apply Vaseline and Band-Aid daily until healed.   Start valacyclovir 500 mg twice daily for 3-5 days then decrease to once daily.   If You Need Anything After Your Visit  If you have any questions or concerns for your doctor, please call our main line at [phone] and press option 4 to reach your doctor's medical assistant. If no one answers, please leave a voicemail as directed and we will return your call as soon as possible. Messages left after 4 pm will be answered the following business day.   You may also send [place] a message via [place]. We typically respond to MyChart messages within 1-2 business days.  For prescription refills, please ask your pharmacy to contact our office. Our fax number is [phone].  If you have an urgent issue when the clinic is closed that cannot wait until the next business day, you can page your doctor at the number below.    Please note that while we do our best to be available for urgent issues outside of office hours, we are not available 24/7.   If you have an urgent issue and are unable to reach [place], you may choose to seek medical care at your doctor's office, retail clinic, urgent care center, or emergency room.  If you have a medical emergency, please immediately call 911 or go to the emergency department.  Pager Numbers  - Dr. Nehemiah Massed: [phone]  - Dr. Laurence Ferrari: [phone]  - Dr. Nicole Kindred: [phone]  In the event of inclement weather, please call our main line at [phone] for an update on the status of any delays or closures.  Dermatology Medication Tips: Please keep the boxes that topical medications come in in order to help keep track of the instructions about where and how to use these. Pharmacies typically print the medication instructions only on the boxes and not directly on the medication tubes.   If your medication is too expensive, please contact our  office at [phone] option 4 or send [place] a message through [place].   We are unable to tell what your co-pay for medications will be in advance as this is different depending on your insurance coverage. However, we may be able to find a substitute medication at lower cost or fill out paperwork to get insurance to cover a needed medication.   If a prior authorization is required to get your medication covered by your insurance company, please allow [place] 1-2 business days to complete this process.  Drug prices often vary depending on where the prescription is filled and some pharmacies may offer cheaper prices.  The website www.goodrx.com contains coupons for medications through different pharmacies. The prices here do not account for what the cost may be with help from insurance (it may be cheaper with your insurance), but the website can give you the price if you did not use any insurance.  - You can print the associated coupon and take it with your prescription to the pharmacy.  - You may also stop by our office during regular business hours and pick up a GoodRx coupon card.  - If you need your prescription sent electronically to a different pharmacy, notify our office through [facility] or by phone at [phone] option 4.     Si Usted Necesita Algo Despus de Su Visita  Tambin puede enviarnos un mensaje a travs de Pharmacist, community. Por lo general respondemos a los mensajes de [employer]  en el transcurso de 1 a 2 das hbiles.  Para renovar recetas, por favor pida a su farmacia que se ponga en contacto con nuestra oficina. Harland Dingwall de fax es [place] [phone].  Si tiene un asunto urgente cuando la clnica est cerrada y que no puede esperar hasta el siguiente da hbil, puede llamar/localizar a su doctor(a) al nmero que aparece a continuacin.   Por favor, tenga en cuenta que aunque hacemos todo lo posible para estar disponibles para asuntos urgentes fuera del horario de [place], no  estamos disponibles las 24 horas del da, los 7 das de la [place].   Si tiene un problema urgente y no puede comunicarse con nosotros, puede optar por buscar atencin mdica  en el consultorio de su doctor(a), en una clnica privada, en un centro de atencin urgente o en una sala de emergencias.  Si tiene Engineering geologist, por favor llame inmediatamente al 911 o vaya a la sala de emergencias.  Nmeros de bper  - Dr. Nehemiah Massed: [phone]  - Dra. Moye: [phone]  - Dra. Nicole Kindred: [phone]  En caso de inclemencias del [place], por favor llame a Johnsie Kindred principal al [phone] para una actualizacin sobre el [place] de cualquier retraso o cierre.  Consejos para la medicacin en dermatologa: Por favor, guarde las cajas en las que vienen los medicamentos de uso tpico para ayudarle a seguir las instrucciones sobre dnde y cmo usarlos. Las farmacias generalmente imprimen las instrucciones del medicamento slo en las cajas y no directamente en los tubos del [place].   Si su medicamento es muy caro, por favor, pngase en contacto con Zigmund Daniel llamando al [phone] y presione la opcin 4 o envenos un mensaje a travs de Pharmacist, community.   No podemos decirle cul ser su copago por los medicamentos por adelantado ya que esto es diferente dependiendo de la cobertura de su seguro. Sin embargo, es posible que podamos encontrar un medicamento sustituto a Electrical engineer un formulario para que el seguro cubra el medicamento que se considera necesario.   Si se requiere una autorizacin previa para que su compaa de seguros [place] su medicamento, por favor permtanos de 1 a 2 das hbiles para completar este proceso.  Los precios de los medicamentos varan con frecuencia dependiendo del Environmental consultant de dnde se surte la receta y alguna farmacias pueden ofrecer precios ms baratos.  El sitio web www.goodrx.com tiene cupones para medicamentos de Airline pilot. Los precios  aqu no tienen en cuenta lo que podra costar con la ayuda del seguro (puede ser ms barato con su seguro), pero el sitio web puede darle el precio si no utiliz Research scientist (physical sciences).  - Puede imprimir el cupn correspondiente y llevarlo con su receta a la farmacia.  - Tambin puede pasar por nuestra oficina durante el horario de atencin regular y Charity fundraiser una tarjeta de cupones de GoodRx.  - Si necesita que su receta se enve electrnicamente a una farmacia diferente, informe a nuestra oficina a travs de MyChart de [place] o por telfono llamando al [phone] y presione la opcin 4.

## 2021-08-22 NOTE — Progress Notes (Signed)
° °  Follow-Up Visit   Subjective  Lindsay Tran is a 76 y.o. female who presents for the following: Follow-up (Patient here today for 2 month ISK follow up at right thenar hand, right wrist and spinal upper back. Patient does have a spot at chest, present for a few months and itches. She was treating as psoriasis but has not resolved. Patient also with a spot at left hip/upper thigh that has been there for years and gets crusty. It goes away but comes back. She also has a spot at top of buttock that gets sore. ).  Patient being followed by rheumatologist who has increased her MTX. Patient advises she is doing better on increased dose but does still use TMC and clobetasol as needed for flares.  The following portions of the chart were reviewed this encounter and updated as appropriate:       Review of Systems:  No other skin or systemic complaints except as noted in HPI or Assessment and Plan.  Objective  Well appearing patient in no apparent distress; mood and affect are within normal limits.  A focused examination was performed including chest, back, hands, buttocks. Relevant physical exam findings are noted in the Assessment and Plan.  spinal upper back Dyspigmented smooth macule or patch.    right mid sternum x 1, left inguinal crease x 1 Erythematous stuck-on, waxy papule or plaque    Assessment & Plan  Scar spinal upper back  S/p LN2  Inflamed seborrheic keratosis right mid sternum x 1, left inguinal crease x 1  Destruction of lesion - right mid sternum x 1, left inguinal crease x 1 Complexity: simple   Destruction method: cryotherapy   Informed consent: discussed and consent obtained   Lesion destroyed using liquid nitrogen: Yes   Region frozen until ice ball extended beyond lesion: Yes   Outcome: patient tolerated procedure well with no complications   Post-procedure details: wound care instructions given    Seborrheic Keratoses - Stuck-on, waxy, tan-brown  papules and/or plaques  - Benign-appearing - Discussed benign etiology and prognosis. - Observe - Call for any changes  Actinic Damage - chronic, secondary to cumulative UV radiation exposure/sun exposure over time - diffuse scaly erythematous macules with underlying dyspigmentation - Recommend daily broad spectrum sunscreen SPF 30+ to sun-exposed areas, reapply every 2 hours as needed.  - Recommend staying in the shade or wearing long sleeves, sun glasses (UVA+UVB protection) and wide brim hats (4-inch brim around the entire circumference of the hat). - Call for new or changing lesions.  No follow-ups on file.  Graciella Belton, RMA, am acting as scribe for Brendolyn Patty, MD .

## 2021-08-22 NOTE — Progress Notes (Addendum)
Follow-Up Visit   Subjective  Lindsay Tran is a 76 y.o. female who presents for the following: Follow-up (Patient here today for 2 month ISK follow up at right thenar hand, right wrist and spinal upper back. Patient does have a spot at chest, present for a few months and itches. She was treating as psoriasis but has not resolved. Patient also with a spot at left hip/upper thigh that has been there for years and gets crusty. It goes away but comes back. She also has a spot at top of buttock that gets sore. ). She has h/o herpes but hasn't flared in years.  Patient has psoriasis with arthritis being followed by rheumatologist who has increased her MTX. Patient advises she is doing better on increased dose but does still use TMC and clobetasol as needed for flares.  The following portions of the chart were reviewed this encounter and updated as appropriate:       Review of Systems:  No other skin or systemic complaints except as noted in HPI or Assessment and Plan.  Objective  Well appearing patient in no apparent distress; mood and affect are within normal limits.  A focused examination was performed including chest, back, hands, buttocks. Relevant physical exam findings are noted in the Assessment and Plan.  spinal upper back pink smooth macule    right mid sternum x 1, left inguinal crease x 1 Erythematous stuck-on, waxy papule or plaque  buttocks crease Clustered small pustules with background erythema  Right Elbow - Posterior Xerosis, otherwise clear    Assessment & Plan  Scar spinal upper back  H/o ISK clear, s/p crotherapy  Benign, observe.    Inflamed seborrheic keratosis right mid sternum x 1, left inguinal crease x 1  Destruction of lesion - right mid sternum x 1, left inguinal crease x 1  Destruction method: cryotherapy   Informed consent: discussed and consent obtained   Lesion destroyed using liquid nitrogen: Yes   Region frozen until ice ball  extended beyond lesion: Yes   Outcome: patient tolerated procedure well with no complications   Post-procedure details: wound care instructions given   Additional details:  Prior to procedure, discussed risks of blister formation, small wound, skin dyspigmentation, or rare scar following cryotherapy. Recommend Vaseline ointment to treated areas while healing.   HSV infection buttocks crease  Recurrent flare  Start valacyclovir 500 mg twice daily for 3-5 days then decrease to once daily for suppressive dosing   Herpes Simplex Virus is a chronic recurring blistering; scabbing sore-producing viral infection that is recurrent usually in the same area triggered by stress, sun/UV exposure and trauma.  It is infectious and can be spread from person to person by direct contact.  It is not curable, but is treatable with topical and oral medication.   valACYclovir (VALTREX) 500 MG tablet - buttocks crease Take 1 tablet (500 mg total) by mouth 2 (two) times daily.  Psoriasis Right Elbow - Posterior  With PsA on MTX, improved on higher dose, managed by rheumatology   Continue TMC ointment and clobetasol cream as directed qd//bid prn flares.  Psoriasis is a chronic non-curable, but treatable genetic/hereditary disease that may have other systemic features affecting other organ systems such as joints (Psoriatic Arthritis). It is associated with an increased risk of inflammatory bowel disease, heart disease, non-alcoholic fatty liver disease, and depression.    Related Medications triamcinolone ointment (KENALOG) 0.1 % Apply topically 2 (two) times daily.  Seborrheic Keratoses - Stuck-on, waxy,  tan-brown papules and/or plaques  - Benign-appearing - Discussed benign etiology and prognosis. - Observe - Call for any changes  Actinic Damage - chronic, secondary to cumulative UV radiation exposure/sun exposure over time - diffuse scaly erythematous macules with underlying dyspigmentation -  Recommend daily broad spectrum sunscreen SPF 30+ to sun-exposed areas, reapply every 2 hours as needed.  - Recommend staying in the shade or wearing long sleeves, sun glasses (UVA+UVB protection) and wide brim hats (4-inch brim around the entire circumference of the hat). - Call for new or changing lesions.  Return if symptoms worsen or fail to improve.  Graciella Belton, RMA, am acting as scribe for Brendolyn Patty, MD .  Documentation: I have reviewed the above documentation for accuracy and completeness, and I agree with the above.  Brendolyn Patty MD

## 2021-08-31 DIAGNOSIS — Z79899 Other long term (current) drug therapy: Secondary | ICD-10-CM | POA: Diagnosis not present

## 2021-08-31 DIAGNOSIS — M170 Bilateral primary osteoarthritis of knee: Secondary | ICD-10-CM | POA: Diagnosis not present

## 2021-08-31 DIAGNOSIS — L409 Psoriasis, unspecified: Secondary | ICD-10-CM | POA: Diagnosis not present

## 2021-08-31 DIAGNOSIS — L4050 Arthropathic psoriasis, unspecified: Secondary | ICD-10-CM | POA: Diagnosis not present

## 2021-08-31 DIAGNOSIS — M7062 Trochanteric bursitis, left hip: Secondary | ICD-10-CM | POA: Diagnosis not present

## 2021-09-02 ENCOUNTER — Encounter (INDEPENDENT_AMBULATORY_CARE_PROVIDER_SITE_OTHER): Payer: Self-pay | Admitting: Nurse Practitioner

## 2021-09-02 ENCOUNTER — Ambulatory Visit (INDEPENDENT_AMBULATORY_CARE_PROVIDER_SITE_OTHER): Payer: PPO

## 2021-09-02 ENCOUNTER — Ambulatory Visit (INDEPENDENT_AMBULATORY_CARE_PROVIDER_SITE_OTHER): Payer: PPO | Admitting: Nurse Practitioner

## 2021-09-02 ENCOUNTER — Other Ambulatory Visit: Payer: Self-pay

## 2021-09-02 ENCOUNTER — Other Ambulatory Visit (INDEPENDENT_AMBULATORY_CARE_PROVIDER_SITE_OTHER): Payer: Self-pay | Admitting: Nurse Practitioner

## 2021-09-02 ENCOUNTER — Other Ambulatory Visit (INDEPENDENT_AMBULATORY_CARE_PROVIDER_SITE_OTHER): Payer: Self-pay | Admitting: Family Medicine

## 2021-09-02 VITALS — BP 133/78 | HR 65 | Resp 15 | Wt 140.4 lb

## 2021-09-02 DIAGNOSIS — I83812 Varicose veins of left lower extremities with pain: Secondary | ICD-10-CM

## 2021-09-02 DIAGNOSIS — M1990 Unspecified osteoarthritis, unspecified site: Secondary | ICD-10-CM | POA: Diagnosis not present

## 2021-09-02 NOTE — Progress Notes (Signed)
Subjective:    Patient ID: Lindsay Tran, female    DOB: February 04, 1946, 76 y.o.   MRN: [id number] Chief Complaint  Patient presents with   Follow-up    Ultrasound follow up     Lindsay Tran is a 76 year old female that returns today for evaluation of varicose veins.  The patient has previously had treatment of her left great saphenous vein in 2020.  She underwent endovenous laser ablation with subsequent sclerotherapy.  Recently the patient has been having some soreness in her ankle area as well as increased varicosities.  Suggested by her podiatrist that she have a reevaluation of her varicose veins.  She also notes that she gets cortisone shots within her ankle and that does help the pain for a short period of time.  There are no open wounds or ulcerations.  The patient also notes that she has continued to wear medical grade compression stockings as well as elevating her legs as well, without significant improvement,  Today noninvasive studies show evidence of reflux in the deep venous system.  There is no evidence of DVT or superficial thrombophlebitis.  She does have evidence of reflux in the great saphenous vein at the saphenofemoral junction as well as the proximal thigh.  Previous endovenous ablation left lower extremity is intact.      Review of Systems  Cardiovascular:  Positive for leg swelling.  All other systems reviewed and are negative.     Objective:   Physical Exam Vitals reviewed.  HENT:     Head: Normocephalic.  Cardiovascular:     Rate and Rhythm: Normal rate.     Pulses: Normal pulses.  Pulmonary:     Effort: Pulmonary effort is normal.  Skin:    General: Skin is warm and dry.     Comments: Numerous varicosities in the left ankle area  Neurological:     Mental Status: She is alert and oriented to person, place, and time.  Psychiatric:        Mood and Affect: Mood normal.        Behavior: Behavior normal.        Thought Content: Thought content normal.         Judgment: Judgment normal.    BP 133/78 (BP Location: Left Arm)    Pulse 65    Resp 15    Wt 140 lb 6.4 oz (63.7 kg)    BMI 27.42 kg/m   Past Medical History:  Diagnosis Date   Abdominal pain    Adjustment disorder with anxiety    Anemia    Anxiety    adjustment disorder with anxiety   Arthritis    Atrophic vaginitis    BPPV (benign paroxysmal positional vertigo)    CAD in native artery    Cataract    Chronic diarrhea    Chronic pain of right knee    Coronary artery disease    Dysphagia    Dysphasia    Dysuria    Family history of colon cancer    Gastric ulcer 1980's   Gastric ulcer    Gastroesophageal hernia    GERD (gastroesophageal reflux disease)    High cholesterol    Hyperlipidemia    Hypertension    Macular degeneration    Osteoporosis    Osteoporosis    post-menopausal   Pedal edema    Psoriasis    Schatzki's ring 05/02/2013   Schatzki's ring    Scoliosis    Scoliosis  UTI (urinary tract infection)     Social History   Socioeconomic History   Marital status: Single    Spouse name: Not on file   Number of children: Not on file   Years of education: Not on file   Highest education level: Not on file  Occupational History   Not on file  Tobacco Use   Smoking status: Never   Smokeless tobacco: Never  Vaping Use   Vaping Use: Never used  Substance and Sexual Activity   Alcohol use: No    Alcohol/week: 0.0 standard drinks   Drug use: No   Sexual activity: Never  Other Topics Concern   Not on file  Social History Narrative   Divorced   Retired as a Scientist, research (medical)   12 + years of school    Social Determinants of Radio broadcast assistant Strain: Low Risk    Difficulty of Paying Living Expenses: Not hard at all  Food Insecurity: No Food Insecurity   Worried About Charity fundraiser in the Last Year: Never true   Arboriculturist in the Last Year: Never true  Transportation Needs: No Transportation Needs   Lack of Transportation  (Medical): No   Lack of Transportation (Non-Medical): No  Physical Activity: Not on file  Stress: No Stress Concern Present   Feeling of Stress : Not at all  Social Connections: Unknown   Frequency of Communication with Friends and Family: More than three times a week   Frequency of Social Gatherings with Friends and Family: More than three times a week   Attends Religious Services: Not on file   Active Member of Clubs or Organizations: Not on file   Attends Archivist Meetings: Not on file   Marital Status: Not on file  Intimate Partner Violence: Not At Risk   Fear of Current or Ex-Partner: No   Emotionally Abused: No   Physically Abused: No   Sexually Abused: No    Past Surgical History:  Procedure Laterality Date   ABDOMINAL HYSTERECTOMY     CARDIAC CATHETERIZATION     CHOLECYSTECTOMY     COLONOSCOPY     COLONOSCOPY N/A 11/12/2014   Procedure: COLONOSCOPY;  Surgeon: Manya Silvas, MD;  Location: [place];  Service: Endoscopy;  Laterality: N/A;   COLONOSCOPY N/A 05/24/2020   Procedure: COLONOSCOPY;  Surgeon: Toledo, Benay Pike, MD;  Location: [facility] ENDOSCOPY;  Service: Endoscopy;  Laterality: N/A;   DIAGNOSTIC LAPAROSCOPY     paraesphogeal hernia   ESOPHAGOGASTRODUODENOSCOPY     ESOPHAGOGASTRODUODENOSCOPY (EGD) WITH PROPOFOL N/A 04/16/2017   Procedure: ESOPHAGOGASTRODUODENOSCOPY (EGD) WITH PROPOFOL;  Surgeon: Manya Silvas, MD;  Location: [facility] ENDOSCOPY;  Service: Endoscopy;  Laterality: N/A;   ESOPHAGOGASTRODUODENOSCOPY (EGD) WITH PROPOFOL N/A 02/10/2021   Procedure: ESOPHAGOGASTRODUODENOSCOPY (EGD) WITH PROPOFOL;  Surgeon: [name], MD;  Location: [facility] ENDOSCOPY;  Service: Gastroenterology;  Laterality: N/A;   GIVENS CAPSULE STUDY N/A 03/08/2021   Procedure: GIVENS CAPSULE STUDY;  Surgeon: [name], MD;  Location: [facility] ENDOSCOPY;  Service: Gastroenterology;  Laterality: N/A;   HERNIA REPAIR     inguinal hernia   HERNIA REPAIR     umbilical hernia     Family History  Problem Relation Age of Onset   Diabetes Mother    Parkinson's disease Mother    Cancer Father        Pancreatic   Cancer Sister        Lung   Diabetes Sister    Alzheimer's  disease Paternal Aunt        x2 sisters   Diabetes Sister    Alzheimer's disease Sister    Tics Sister    Breast cancer Neg Hx     Allergies  Allergen Reactions   Gluten Meal Rash    CBC Latest Ref Rng & Units 06/15/2021 02/25/2021 12/20/2020  WBC 4.0 - 10.5 K/uL 5.8 6.5 5.3  Hemoglobin 12.0 - 15.0 g/dL 14.5 14.5 13.4  Hematocrit 36.0 - 46.0 % 43.6 44.6 41.0  Platelets 150 - 400 K/uL 257 219 238.0      CMP     Component Value Date/Time   NA 138 08/04/2021 0959   K 4.5 08/04/2021 0959   CL 100 08/04/2021 0959   CO2 31 08/04/2021 0959   GLUCOSE 89 08/04/2021 0959   BUN 13 08/04/2021 0959   CREATININE 0.69 08/04/2021 0959   CALCIUM 9.4 08/04/2021 0959   PROT 6.7 08/04/2021 0959   ALBUMIN 4.2 08/04/2021 0959   AST 20 08/04/2021 0959   ALT 14 08/04/2021 0959   ALKPHOS 53 08/04/2021 0959   BILITOT 0.6 08/04/2021 0959     No results found.     Assessment & Plan:   1. Varicose veins of left lower extremity with pain Recommend:  The patient has had successful ablation of the previously incompetent saphenous venous system but still has persistent symptoms of pain and swelling that are having a negative impact on daily life and daily activities.  Patient should undergo injection sclerotherapy to treat the residual varicosities.  The risks, benefits and alternative therapies were reviewed in detail with the patient.  All questions were answered.  The patient agrees to proceed with sclerotherapy at their convenience.  The patient will continue wearing the graduated compression stockings and using the over-the-counter pain medications to treat her symptoms.       2. Arthritis Cortisone shots have not worked completely.  We will treat the patient's varicosities to the  ankle area and if there is continued pain we will have the patient discuss with her podiatrist once again.   Current Outpatient Medications on File Prior to Visit  Medication Sig Dispense Refill   clobetasol (OLUX) 0.05 % topical foam Apply topically 2 (two) times daily. 50 g 0   clobetasol ointment (TEMOVATE) 8.18 % Apply 1 application topically 2 (two) times daily as needed. For rash. Avoid face. 180 g 1   Cyanocobalamin (VITAMIN B 12 PO) Take by mouth.     folic acid (FOLVITE) 1 MG tablet Take 1 mg by mouth daily.     magnesium oxide (MAG-OX) 400 MG tablet Take 1 tablet by mouth daily.     methotrexate (RHEUMATREX) 2.5 MG tablet Take by mouth.     metoprolol tartrate (LOPRESSOR) 100 MG tablet Take 1 tablet (100 mg total) by mouth 2 (two) times daily. 180 tablet 2   Multiple Vitamins-Minerals (PRESERVISION AREDS 2 PO) Take by mouth.     mupirocin ointment (BACTROBAN) 2 % Place 1 application into the nose 2 (two) times daily. 22 g 1   Omega-3 Fatty Acids (OMEGA 3 500 PO) Take 1 capsule by mouth daily.      pantoprazole (PROTONIX) 40 MG tablet Take 1 tablet (40 mg total) by mouth daily. 90 tablet 2   potassium chloride (K-DUR) 10 MEQ tablet Take 10 mEq by mouth daily.     PREMARIN vaginal cream INSERT ONE APPLICATORFUL VAGINALLY TWICE WEEKLY 90 g 2   Probiotic Product (PROBIOTIC DAILY) CAPS Take  1 capsule by mouth.     sertraline (ZOLOFT) 50 MG tablet Take 1 tablet (50 mg total) by mouth daily. 90 tablet 3   simvastatin (ZOCOR) 40 MG tablet TAKE 1 TABLET BY MOUTH ONCE DAILY AT  6PM 90 tablet 2   triamcinolone ointment (KENALOG) 0.1 % Apply topically 2 (two) times daily. 240 g 0   valACYclovir (VALTREX) 500 MG tablet Take 1 tablet (500 mg total) by mouth 2 (two) times daily. 60 tablet 2   No current facility-administered medications on file prior to visit.    There are no Patient Instructions on file for this visit. No follow-ups on file.   Kris Hartmann, NP

## 2021-09-12 ENCOUNTER — Other Ambulatory Visit: Payer: Self-pay | Admitting: *Deleted

## 2021-09-12 DIAGNOSIS — E611 Iron deficiency: Secondary | ICD-10-CM

## 2021-09-19 ENCOUNTER — Encounter: Payer: Self-pay | Admitting: Oncology

## 2021-09-19 ENCOUNTER — Inpatient Hospital Stay: Payer: PPO | Attending: Oncology

## 2021-09-19 ENCOUNTER — Other Ambulatory Visit: Payer: Self-pay

## 2021-09-19 ENCOUNTER — Inpatient Hospital Stay: Payer: PPO | Admitting: Oncology

## 2021-09-19 VITALS — BP 157/97 | HR 51 | Temp 97.2°F | Wt 136.0 lb

## 2021-09-19 DIAGNOSIS — D509 Iron deficiency anemia, unspecified: Secondary | ICD-10-CM | POA: Insufficient documentation

## 2021-09-19 DIAGNOSIS — Z90710 Acquired absence of both cervix and uterus: Secondary | ICD-10-CM | POA: Diagnosis not present

## 2021-09-19 DIAGNOSIS — I10 Essential (primary) hypertension: Secondary | ICD-10-CM | POA: Insufficient documentation

## 2021-09-19 DIAGNOSIS — E611 Iron deficiency: Secondary | ICD-10-CM

## 2021-09-19 DIAGNOSIS — Z801 Family history of malignant neoplasm of trachea, bronchus and lung: Secondary | ICD-10-CM | POA: Diagnosis not present

## 2021-09-19 DIAGNOSIS — Z800 Family history of malignant neoplasm of digestive organs: Secondary | ICD-10-CM | POA: Insufficient documentation

## 2021-09-19 LAB — CBC WITH DIFFERENTIAL/PLATELET
Abs Immature Granulocytes: 0.03 10*3/uL (ref 0.00–0.07)
Basophils Absolute: 0 10*3/uL (ref 0.0–0.1)
Basophils Relative: 0 %
Eosinophils Absolute: 0.2 10*3/uL (ref 0.0–0.5)
Eosinophils Relative: 3 %
HCT: 43.3 % (ref 36.0–46.0)
Hemoglobin: 14.1 g/dL (ref 12.0–15.0)
Immature Granulocytes: 0 %
Lymphocytes Relative: 21 %
Lymphs Abs: 1.5 10*3/uL (ref 0.7–4.0)
MCH: 31.8 pg (ref 26.0–34.0)
MCHC: 32.6 g/dL (ref 30.0–36.0)
MCV: 97.5 fL (ref 80.0–100.0)
Monocytes Absolute: 0.6 10*3/uL (ref 0.1–1.0)
Monocytes Relative: 9 %
Neutro Abs: 4.9 10*3/uL (ref 1.7–7.7)
Neutrophils Relative %: 67 %
Platelets: 253 10*3/uL (ref 150–400)
RBC: 4.44 MIL/uL (ref 3.87–5.11)
RDW: 13.4 % (ref 11.5–15.5)
WBC: 7.3 10*3/uL (ref 4.0–10.5)
nRBC: 0 % (ref 0.0–0.2)

## 2021-09-19 LAB — IRON AND TIBC
Iron: 100 ug/dL (ref 28–170)
Saturation Ratios: 34 % — ABNORMAL HIGH (ref 10.4–31.8)
TIBC: 297 ug/dL (ref 250–450)
UIBC: 197 ug/dL

## 2021-09-19 LAB — FERRITIN: Ferritin: 267 ng/mL (ref 11–307)

## 2021-09-19 NOTE — Progress Notes (Signed)
[place]  Telephone:([phone] [phone]  Patient Care Team: Leone Haven, MD as PCP - General (Family Medicine) Sindy Guadeloupe, MD as Consulting Physician (Hematology and Oncology)   Name of the patient: Lindsay Tran  [id number]  [birth year]   Date of visit: 09/19/2021  Diagnosis: 1. Iron deficiency - CBC with Differential/Platelet; Future - Ferritin; Future - Iron and TIBC; Future - CBC with Differential/Platelet; Future - Ferritin; Future - Iron and TIBC; Future  History of present illness:  Patient is a 76 year old female who was seen in the past for thrombocytosis which subsequently resolved.  Presently she has been referred to [place] for iron deficiency anemia.  Most recent CBC from 12/20/2020 Showed white count of 5.3, H&H of 13.4/41 with a platelet count of 238.  Ferritin was low at 14.5 with an iron saturation of 16.6.  Patient reports having significant fatigue.  Denies any consistent use of NSAIDs.  Denies any blood loss in her stool or urine.  She has undergone colonoscopy with Dr. Alice Reichert in mid November 2021 which showed nonbleeding internal hemorrhoids and diverticulosis in the sigmoid colon.  Last endoscopy was in 2018 which showed evidence of gastritis but otherwise normal esophagus.  Interval History- Reports intermittent fatigue, gas, bloating and diarrhea averaging several times on most days. Feels stable from previous.  Scheduled to be seen by [facility] on Wednesday for additional testing.  Had EGD/colonoscopy over a year ago which was negative for any abnormality.  Also had capsule study which was negative.  Denies any bleeding.   Review of Systems  Constitutional:  Positive for malaise/fatigue. Negative for chills, fever and weight loss.  HENT:  Negative for congestion, ear pain and tinnitus.   Eyes: Negative.  Negative for blurred vision and double vision.  Respiratory: Negative.  Negative for cough, sputum production and  shortness of breath.   Cardiovascular: Negative.  Negative for chest pain, palpitations and leg swelling.  Gastrointestinal:  Positive for abdominal pain, constipation and diarrhea. Negative for nausea and vomiting.  Genitourinary:  Negative for dysuria, frequency and urgency.  Musculoskeletal:  Negative for back pain and falls.  Skin: Negative.  Negative for rash.  Neurological: Negative.  Negative for weakness and headaches.  Endo/Heme/Allergies: Negative.  Does not bruise/bleed easily.  Psychiatric/Behavioral: Negative.  Negative for depression. The patient is not nervous/anxious and does not have insomnia.    Allergies  Allergen Reactions   Gluten Meal Rash    Past Medical History:  Diagnosis Date   Abdominal pain    Adjustment disorder with anxiety    Anemia    Anxiety    adjustment disorder with anxiety   Arthritis    Atrophic vaginitis    BPPV (benign paroxysmal positional vertigo)    CAD in native artery    Cataract    Chronic diarrhea    Chronic pain of right knee    Coronary artery disease    Dysphagia    Dysphasia    Dysuria    Family history of colon cancer    Gastric ulcer 1980's   Gastric ulcer    Gastroesophageal hernia    GERD (gastroesophageal reflux disease)    High cholesterol    Hyperlipidemia    Hypertension    Macular degeneration    Osteoporosis    Osteoporosis    post-menopausal   Pedal edema    Psoriasis    Schatzki's ring 05/02/2013   Schatzki's ring    Scoliosis    Scoliosis  UTI (urinary tract infection)     Past Surgical History:  Procedure Laterality Date   ABDOMINAL HYSTERECTOMY     CARDIAC CATHETERIZATION     CHOLECYSTECTOMY     COLONOSCOPY     COLONOSCOPY N/A 11/12/2014   Procedure: COLONOSCOPY;  Surgeon: Manya Silvas, MD;  Location: [place];  Service: Endoscopy;  Laterality: N/A;   COLONOSCOPY N/A 05/24/2020   Procedure: COLONOSCOPY;  Surgeon: Toledo, Benay Pike, MD;  Location: [facility] ENDOSCOPY;  Service:  Endoscopy;  Laterality: N/A;   DIAGNOSTIC LAPAROSCOPY     paraesphogeal hernia   ESOPHAGOGASTRODUODENOSCOPY     ESOPHAGOGASTRODUODENOSCOPY (EGD) WITH PROPOFOL N/A 04/16/2017   Procedure: ESOPHAGOGASTRODUODENOSCOPY (EGD) WITH PROPOFOL;  Surgeon: Manya Silvas, MD;  Location: [facility] ENDOSCOPY;  Service: Endoscopy;  Laterality: N/A;   ESOPHAGOGASTRODUODENOSCOPY (EGD) WITH PROPOFOL N/A 02/10/2021   Procedure: ESOPHAGOGASTRODUODENOSCOPY (EGD) WITH PROPOFOL;  Surgeon: [name], MD;  Location: [facility] ENDOSCOPY;  Service: Gastroenterology;  Laterality: N/A;   GIVENS CAPSULE STUDY N/A 03/08/2021   Procedure: GIVENS CAPSULE STUDY;  Surgeon: [name], MD;  Location: [facility] ENDOSCOPY;  Service: Gastroenterology;  Laterality: N/A;   HERNIA REPAIR     inguinal hernia   HERNIA REPAIR     umbilical hernia    Social History   Socioeconomic History   Marital status: Single    Spouse name: Not on file   Number of children: Not on file   Years of education: Not on file   Highest education level: Not on file  Occupational History   Not on file  Tobacco Use   Smoking status: Never   Smokeless tobacco: Never  Vaping Use   Vaping Use: Never used  Substance and Sexual Activity   Alcohol use: No    Alcohol/week: 0.0 standard drinks   Drug use: No   Sexual activity: Never  Other Topics Concern   Not on file  Social History Narrative   Divorced   Retired as a Scientist, research (medical)   12 + years of school    Social Determinants of Radio broadcast assistant Strain: Low Risk    Difficulty of Paying Living Expenses: Not hard at all  Food Insecurity: No Food Insecurity   Worried About Charity fundraiser in the Last Year: Never true   Arboriculturist in the Last Year: Never true  Transportation Needs: No Transportation Needs   Lack of Transportation (Medical): No   Lack of Transportation (Non-Medical): No  Physical Activity: Not on file  Stress: No Stress Concern Present   Feeling of Stress : Not at  all  Social Connections: Unknown   Frequency of Communication with Friends and Family: More than three times a week   Frequency of Social Gatherings with Friends and Family: More than three times a week   Attends Religious Services: Not on Electrical engineer or Organizations: Not on file   Attends Archivist Meetings: Not on file   Marital Status: Not on file  Intimate Partner Violence: Not At Risk   Fear of Current or Ex-Partner: No   Emotionally Abused: No   Physically Abused: No   Sexually Abused: No    Family History  Problem Relation Age of Onset   Diabetes Mother    Parkinson's disease Mother    Cancer Father        Pancreatic   Cancer Sister        Lung   Diabetes Sister    Alzheimer's  disease Paternal Aunt        x2 sisters   Diabetes Sister    Alzheimer's disease Sister    Tics Sister    Breast cancer Neg Hx      Current Outpatient Medications:    clobetasol (OLUX) 0.05 % topical foam, Apply topically 2 (two) times daily., Disp: 50 g, Rfl: 0   clobetasol ointment (TEMOVATE) 2.95 %, Apply 1 application topically 2 (two) times daily as needed. For rash. Avoid face., Disp: 180 g, Rfl: 1   Cyanocobalamin (VITAMIN B 12 PO), Take by mouth., Disp: , Rfl:    folic acid (FOLVITE) 1 MG tablet, Take 1 mg by mouth daily., Disp: , Rfl:    magnesium oxide (MAG-OX) 400 MG tablet, Take 1 tablet by mouth daily., Disp: , Rfl:    methotrexate (RHEUMATREX) 2.5 MG tablet, Take by mouth., Disp: , Rfl:    metoprolol tartrate (LOPRESSOR) 100 MG tablet, Take 1 tablet (100 mg total) by mouth 2 (two) times daily., Disp: 180 tablet, Rfl: 2   Multiple Vitamins-Minerals (PRESERVISION AREDS 2 PO), Take by mouth., Disp: , Rfl:    mupirocin ointment (BACTROBAN) 2 %, Place 1 application into the nose 2 (two) times daily., Disp: 22 g, Rfl: 1   Omega-3 Fatty Acids (OMEGA 3 500 PO), Take 1 capsule by mouth daily. , Disp: , Rfl:    pantoprazole (PROTONIX) 40 MG tablet, Take 1  tablet (40 mg total) by mouth daily., Disp: 90 tablet, Rfl: 2   potassium chloride (K-DUR) 10 MEQ tablet, Take 10 mEq by mouth daily., Disp: , Rfl:    PREMARIN vaginal cream, INSERT ONE APPLICATORFUL VAGINALLY TWICE WEEKLY, Disp: 90 g, Rfl: 2   Probiotic Product (PROBIOTIC DAILY) CAPS, Take 1 capsule by mouth., Disp: , Rfl:    sertraline (ZOLOFT) 50 MG tablet, Take 1 tablet (50 mg total) by mouth daily., Disp: 90 tablet, Rfl: 3   simvastatin (ZOCOR) 40 MG tablet, TAKE 1 TABLET BY MOUTH ONCE DAILY AT  6PM, Disp: 90 tablet, Rfl: 2   triamcinolone ointment (KENALOG) 0.1 %, Apply topically 2 (two) times daily., Disp: 240 g, Rfl: 0   valACYclovir (VALTREX) 500 MG tablet, Take 1 tablet (500 mg total) by mouth 2 (two) times daily., Disp: 60 tablet, Rfl: 2  VAS [place] LOWER EXTREMITY VENOUS REFLUX  Result Date: 09/05/2021  Lower Venous Reflux Study Patient Name:  KARYSA HEFT  Date of Exam:   09/02/2021 Medical Rec #: [id number]        Accession #:    [id number] Date of Birth: August 07, 1945         Patient Gender: F Patient Age:   8 years Exam Location:  [facility] Procedure:      VAS [place] LOWER EXTREMITY VENOUS REFLUX Referring Phys: Eulogio Ditch --------------------------------------------------------------------------------  Indications: Varicosities, and left GSV ablation 2020.  Performing Technologist: Concha Norway RVT  Examination Guidelines: A complete evaluation includes B-mode imaging, spectral Doppler, color Doppler, and power Doppler as needed of all accessible portions of each vessel. Bilateral testing is considered an integral part of a complete examination. Limited examinations for reoccurring indications may be performed as noted. The reflux portion of the exam is performed with the patient in reverse Trendelenburg. Significant venous reflux is defined as >500 ms in the superficial venous system, and >1 second in the deep venous system.   +--------------+--------+------+----------+------------+-----------------------+  LEFT           Reflux   Reflux   Reflux  Diameter cms Comments                                 No        Yes      Time                                          +--------------+--------+------+----------+------------+-----------------------+  FV mid                   yes   >1 second                                        +--------------+--------+------+----------+------------+-----------------------+  Popliteal                yes   >1 second                                        +--------------+--------+------+----------+------------+-----------------------+  GSV at SFJ               yes    >500 ms       .35                               +--------------+--------+------+----------+------------+-----------------------+  GSV prox thigh           yes    >500 ms       .69                               +--------------+--------+------+----------+------------+-----------------------+  GSV mid thigh                                          prior                                                                            ablation/stripping       +--------------+--------+------+----------+------------+-----------------------+  GSV dist thigh                                         prior                                                                            ablation/stripping       +--------------+--------+------+----------+------------+-----------------------+  GSV at  knee                                            prior                                                                            ablation/stripping       +--------------+--------+------+----------+------------+-----------------------+   Summary: Left: - No evidence of deep vein thrombosis seen in the left lower extremity, from the common femoral through the popliteal veins. - No evidence of superficial venous thrombosis in the left lower extremity. - Venous reflux is noted in  the left sapheno-femoral junction. - Venous reflux is noted in the left femoral vein. - Venous reflux is noted in the left popliteal vein. - Prior ablation of GSV. Proximal GSV remains patent approximately 4cm from SFJ.  *See table(s) above for measurements and observations. Electronically signed by Hortencia Pilar MD on 09/05/2021 at 12:59:08 PM.    Final     No images are attached to the encounter.   CMP Latest Ref Rng & Units 08/04/2021  Glucose 70 - 99 mg/dL 89  BUN 6 - 23 mg/dL 13  Creatinine 0.40 - 1.20 mg/dL 0.69  Sodium 135 - 145 mEq/L 138  Potassium 3.5 - 5.1 mEq/L 4.5  Chloride 96 - 112 mEq/L 100  CO2 19 - 32 mEq/L 31  Calcium 8.4 - 10.5 mg/dL 9.4  Total Protein 6.0 - 8.3 g/dL 6.7  Total Bilirubin 0.2 - 1.2 mg/dL 0.6  Alkaline Phos 39 - 117 U/L 53  AST 0 - 37 U/L 20  ALT 0 - 35 U/L 14   CBC Latest Ref Rng & Units 09/19/2021  WBC 4.0 - 10.5 K/uL 7.3  Hemoglobin 12.0 - 15.0 g/dL 14.1  Hematocrit 36.0 - 46.0 % 43.3  Platelets 150 - 400 K/uL 253   Physical Exam Constitutional:      Appearance: Normal appearance. She is obese.  HENT:     Head: Normocephalic and atraumatic.  Eyes:     Pupils: Pupils are equal, round, and reactive to light.  Cardiovascular:     Rate and Rhythm: Normal rate and regular rhythm.     Heart sounds: Normal heart sounds. No murmur heard. Pulmonary:     Effort: Pulmonary effort is normal.     Breath sounds: Normal breath sounds. No wheezing.  Abdominal:     General: Bowel sounds are normal. There is no distension.     Palpations: Abdomen is soft.     Tenderness: There is no abdominal tenderness.  Musculoskeletal:        General: Normal range of motion.     Cervical back: Normal range of motion.  Skin:    General: Skin is warm and dry.     Findings: No rash.  Neurological:     Mental Status: She is alert and oriented to person, place, and time.  Psychiatric:        Judgment: Judgment normal.   Assessment and plan: Patient is a 76 year old  female with iron deficiency anemia and this is a routine follow-up visit  Reviewed  labs with patient today.  Hemoglobin stable at 14.1.  Ferritin and iron stores are pending during dictation.  She continues B12 oral supplementation.  Likely does not require any additional iron but we will touch base after ferritin level returns.  Disposition- Return to clinic in 3 months for lab work only and in 6 months for lab work, MD assessment and possible IV iron.  I spent 25 minutes dedicated to the care of this patient (face-to-face and non-face-to-face) on the date of the encounter to include what is described in the assessment and plan.  Visit Diagnosis: 1. Iron deficiency    Faythe Casa, NP 09/19/2021 1:02 PM

## 2021-09-22 ENCOUNTER — Encounter: Payer: Self-pay | Admitting: Oncology

## 2021-09-22 DIAGNOSIS — A049 Bacterial intestinal infection, unspecified: Secondary | ICD-10-CM | POA: Diagnosis not present

## 2021-09-22 DIAGNOSIS — R140 Abdominal distension (gaseous): Secondary | ICD-10-CM | POA: Diagnosis not present

## 2021-09-22 DIAGNOSIS — R109 Unspecified abdominal pain: Secondary | ICD-10-CM | POA: Diagnosis not present

## 2021-09-28 DIAGNOSIS — M7052 Other bursitis of knee, left knee: Secondary | ICD-10-CM | POA: Diagnosis not present

## 2021-09-28 DIAGNOSIS — S83015D Lateral dislocation of left patella, subsequent encounter: Secondary | ICD-10-CM | POA: Diagnosis not present

## 2021-09-28 DIAGNOSIS — M1732 Unilateral post-traumatic osteoarthritis, left knee: Secondary | ICD-10-CM | POA: Diagnosis not present

## 2021-09-28 DIAGNOSIS — M25562 Pain in left knee: Secondary | ICD-10-CM | POA: Diagnosis not present

## 2021-09-28 DIAGNOSIS — G8929 Other chronic pain: Secondary | ICD-10-CM | POA: Diagnosis not present

## 2021-09-29 ENCOUNTER — Encounter (INDEPENDENT_AMBULATORY_CARE_PROVIDER_SITE_OTHER): Payer: Self-pay | Admitting: Nurse Practitioner

## 2021-09-29 ENCOUNTER — Other Ambulatory Visit: Payer: Self-pay

## 2021-09-29 ENCOUNTER — Ambulatory Visit (INDEPENDENT_AMBULATORY_CARE_PROVIDER_SITE_OTHER): Payer: PPO | Admitting: Nurse Practitioner

## 2021-09-29 VITALS — BP 150/86 | HR 60 | Resp 16 | Ht 64.0 in | Wt 138.4 lb

## 2021-09-29 DIAGNOSIS — I83812 Varicose veins of left lower extremities with pain: Secondary | ICD-10-CM

## 2021-10-02 ENCOUNTER — Encounter (INDEPENDENT_AMBULATORY_CARE_PROVIDER_SITE_OTHER): Payer: Self-pay | Admitting: Nurse Practitioner

## 2021-10-02 NOTE — Progress Notes (Signed)
Varicose veins of left lower extremity with inflammation (454.1  I83.10) Current Plans   Indication: Patient presents with symptomatic varicose veins of the left lower extremity.   Procedure: Sclerotherapy using hypertonic saline mixed with 1% Lidocaine was performed on the left lower extremity. Compression wraps were placed. The patient tolerated the procedure well. 

## 2021-10-27 ENCOUNTER — Other Ambulatory Visit: Payer: Self-pay | Admitting: Nurse Practitioner

## 2021-10-27 DIAGNOSIS — G8929 Other chronic pain: Secondary | ICD-10-CM | POA: Diagnosis not present

## 2021-10-27 DIAGNOSIS — R1310 Dysphagia, unspecified: Secondary | ICD-10-CM | POA: Diagnosis not present

## 2021-10-27 DIAGNOSIS — S83015S Lateral dislocation of left patella, sequela: Secondary | ICD-10-CM | POA: Diagnosis not present

## 2021-10-27 DIAGNOSIS — K638219 Small intestinal bacterial overgrowth, unspecified: Secondary | ICD-10-CM | POA: Insufficient documentation

## 2021-10-27 DIAGNOSIS — K219 Gastro-esophageal reflux disease without esophagitis: Secondary | ICD-10-CM

## 2021-10-27 DIAGNOSIS — M25562 Pain in left knee: Secondary | ICD-10-CM | POA: Diagnosis not present

## 2021-10-27 DIAGNOSIS — K6389 Other specified diseases of intestine: Secondary | ICD-10-CM | POA: Diagnosis not present

## 2021-10-27 DIAGNOSIS — M1732 Unilateral post-traumatic osteoarthritis, left knee: Secondary | ICD-10-CM | POA: Diagnosis not present

## 2021-11-01 ENCOUNTER — Encounter (INDEPENDENT_AMBULATORY_CARE_PROVIDER_SITE_OTHER): Payer: Self-pay | Admitting: Nurse Practitioner

## 2021-11-01 ENCOUNTER — Ambulatory Visit (INDEPENDENT_AMBULATORY_CARE_PROVIDER_SITE_OTHER): Payer: PPO | Admitting: Nurse Practitioner

## 2021-11-01 VITALS — BP 139/85 | HR 59 | Resp 16 | Wt 140.4 lb

## 2021-11-01 DIAGNOSIS — I83812 Varicose veins of left lower extremities with pain: Secondary | ICD-10-CM | POA: Diagnosis not present

## 2021-11-03 ENCOUNTER — Ambulatory Visit
Admission: RE | Admit: 2021-11-03 | Discharge: 2021-11-03 | Disposition: A | Discharge status: Home / Self Care | Payer: PPO | Source: Ambulatory Visit | Attending: Nurse Practitioner | Admitting: Nurse Practitioner

## 2021-11-03 DIAGNOSIS — K219 Gastro-esophageal reflux disease without esophagitis: Secondary | ICD-10-CM | POA: Insufficient documentation

## 2021-11-03 DIAGNOSIS — R1310 Dysphagia, unspecified: Secondary | ICD-10-CM | POA: Insufficient documentation

## 2021-11-03 DIAGNOSIS — K224 Dyskinesia of esophagus: Secondary | ICD-10-CM | POA: Diagnosis not present

## 2021-11-12 ENCOUNTER — Encounter (INDEPENDENT_AMBULATORY_CARE_PROVIDER_SITE_OTHER): Payer: Self-pay | Admitting: Nurse Practitioner

## 2021-11-13 NOTE — Progress Notes (Signed)
Varicose veins of left lower extremity with inflammation (454.1  I83.10) Current Plans   Indication: Patient presents with symptomatic varicose veins of the left lower extremity.   Procedure: Sclerotherapy using hypertonic saline mixed with 1% Lidocaine was performed on the left lower extremity. Compression wraps were placed. The patient tolerated the procedure well. 

## 2021-11-25 ENCOUNTER — Encounter: Payer: Self-pay | Admitting: *Deleted

## 2021-11-28 ENCOUNTER — Ambulatory Visit
Admission: RE | Admit: 2021-11-28 | Discharge: 2021-11-28 | Disposition: A | Discharge status: Home / Self Care | Payer: PPO | Attending: Gastroenterology | Admitting: Gastroenterology

## 2021-11-28 ENCOUNTER — Ambulatory Visit: Payer: PPO | Admitting: Anesthesiology

## 2021-11-28 ENCOUNTER — Other Ambulatory Visit: Payer: Self-pay

## 2021-11-28 ENCOUNTER — Encounter: Payer: Self-pay | Admitting: *Deleted

## 2021-11-28 ENCOUNTER — Encounter
Admission: RE | Disposition: A | Discharge status: Home / Self Care | Payer: Self-pay | Source: Home / Self Care | Attending: Gastroenterology

## 2021-11-28 DIAGNOSIS — K5710 Diverticulosis of small intestine without perforation or abscess without bleeding: Secondary | ICD-10-CM | POA: Insufficient documentation

## 2021-11-28 DIAGNOSIS — D649 Anemia, unspecified: Secondary | ICD-10-CM | POA: Insufficient documentation

## 2021-11-28 DIAGNOSIS — K2960 Other gastritis without bleeding: Secondary | ICD-10-CM | POA: Diagnosis not present

## 2021-11-28 DIAGNOSIS — H3530 Unspecified macular degeneration: Secondary | ICD-10-CM | POA: Insufficient documentation

## 2021-11-28 DIAGNOSIS — K3189 Other diseases of stomach and duodenum: Secondary | ICD-10-CM | POA: Diagnosis not present

## 2021-11-28 DIAGNOSIS — K219 Gastro-esophageal reflux disease without esophagitis: Secondary | ICD-10-CM | POA: Insufficient documentation

## 2021-11-28 DIAGNOSIS — Z9889 Other specified postprocedural states: Secondary | ICD-10-CM | POA: Diagnosis not present

## 2021-11-28 DIAGNOSIS — K2950 Unspecified chronic gastritis without bleeding: Secondary | ICD-10-CM | POA: Diagnosis not present

## 2021-11-28 DIAGNOSIS — H8110 Benign paroxysmal vertigo, unspecified ear: Secondary | ICD-10-CM | POA: Diagnosis not present

## 2021-11-28 DIAGNOSIS — M810 Age-related osteoporosis without current pathological fracture: Secondary | ICD-10-CM | POA: Diagnosis not present

## 2021-11-28 DIAGNOSIS — K317 Polyp of stomach and duodenum: Secondary | ICD-10-CM | POA: Insufficient documentation

## 2021-11-28 DIAGNOSIS — Z809 Family history of malignant neoplasm, unspecified: Secondary | ICD-10-CM | POA: Insufficient documentation

## 2021-11-28 DIAGNOSIS — R1013 Epigastric pain: Secondary | ICD-10-CM | POA: Diagnosis not present

## 2021-11-28 DIAGNOSIS — K449 Diaphragmatic hernia without obstruction or gangrene: Secondary | ICD-10-CM | POA: Diagnosis not present

## 2021-11-28 DIAGNOSIS — F418 Other specified anxiety disorders: Secondary | ICD-10-CM | POA: Diagnosis not present

## 2021-11-28 DIAGNOSIS — I10 Essential (primary) hypertension: Secondary | ICD-10-CM | POA: Insufficient documentation

## 2021-11-28 DIAGNOSIS — Z980 Intestinal bypass and anastomosis status: Secondary | ICD-10-CM | POA: Diagnosis not present

## 2021-11-28 DIAGNOSIS — Z800 Family history of malignant neoplasm of digestive organs: Secondary | ICD-10-CM | POA: Insufficient documentation

## 2021-11-28 DIAGNOSIS — I2510 Atherosclerotic heart disease of native coronary artery without angina pectoris: Secondary | ICD-10-CM | POA: Insufficient documentation

## 2021-11-28 DIAGNOSIS — R1310 Dysphagia, unspecified: Secondary | ICD-10-CM | POA: Diagnosis not present

## 2021-11-28 DIAGNOSIS — K229 Disease of esophagus, unspecified: Secondary | ICD-10-CM | POA: Diagnosis not present

## 2021-11-28 HISTORY — PX: ESOPHAGOGASTRODUODENOSCOPY (EGD) WITH PROPOFOL: SHX5813

## 2021-11-28 SURGERY — ESOPHAGOGASTRODUODENOSCOPY (EGD) WITH PROPOFOL
Anesthesia: General

## 2021-11-28 MED ORDER — SODIUM CHLORIDE 0.9 % IV SOLN: INTRAVENOUS | Status: DC

## 2021-11-28 MED ORDER — PROPOFOL 500 MG/50ML IV EMUL
INTRAVENOUS | Status: DC | PRN
  Administered 2021-11-28: 145 ug/kg/min via INTRAVENOUS

## 2021-11-28 MED ORDER — LIDOCAINE HCL (CARDIAC) PF 100 MG/5ML IV SOSY
PREFILLED_SYRINGE | INTRAVENOUS | Status: DC | PRN
  Administered 2021-11-28: 100 mg via INTRAVENOUS

## 2021-11-28 MED ORDER — PROPOFOL 10 MG/ML IV BOLUS
INTRAVENOUS | Status: DC | PRN
  Administered 2021-11-28: 10 mg via INTRAVENOUS
  Administered 2021-11-28: 60 mg via INTRAVENOUS

## 2021-11-28 MED ORDER — GLYCOPYRROLATE 0.2 MG/ML IJ SOLN
INTRAMUSCULAR | Status: DC | PRN
  Administered 2021-11-28: .2 mg via INTRAVENOUS

## 2021-11-28 NOTE — Interval H&P Note (Signed)
History and Physical Interval Note:  11/28/2021 11:52 AM  Lindsay Tran  has presented today for surgery, with the diagnosis of Dysphagia GERD Abnormal Barium Swallow.  The various methods of treatment have been discussed with the patient and family. After consideration of risks, benefits and other options for treatment, the patient has consented to  Procedure(s): ESOPHAGOGASTRODUODENOSCOPY (EGD) WITH PROPOFOL (N/A) as a surgical intervention.  The patient's history has been reviewed, patient examined, no change in status, stable for surgery.  I have reviewed the patient's chart and labs.  Questions were answered to the patient's satisfaction.     Lesly Rubenstein  Ok to proceed with EGD

## 2021-11-28 NOTE — Anesthesia Preprocedure Evaluation (Signed)
Anesthesia Evaluation  Patient identified by MRN, date of birth, ID band Patient awake    Reviewed: Allergy & Precautions, NPO status , Patient's Chart, lab work & pertinent test results  Airway Mallampati: III  TM Distance: >3 FB Neck ROM: Full    Dental  (+) Teeth Intact   Pulmonary neg pulmonary ROS,    Pulmonary exam normal breath sounds clear to auscultation       Cardiovascular Exercise Tolerance: Good hypertension, + CAD  negative cardio ROS Normal cardiovascular exam Rhythm:Regular     Neuro/Psych Anxiety Depression negative neurological ROS  negative psych ROS   GI/Hepatic negative GI ROS, Neg liver ROS, hiatal hernia, GERD  Medicated,  Endo/Other  negative endocrine ROS  Renal/GU negative Renal ROS  negative genitourinary   Musculoskeletal   Abdominal Normal abdominal exam  (+)   Peds negative pediatric ROS (+)  Hematology negative hematology ROS (+) Blood dyscrasia, anemia ,   Anesthesia Other Findings Past Medical History: No date: Abdominal pain No date: Adjustment disorder with anxiety No date: Anemia No date: Anxiety     Comment:  adjustment disorder with anxiety No date: Arthritis No date: Atrophic vaginitis No date: BPPV (benign paroxysmal positional vertigo) No date: CAD in native artery No date: Cataract No date: Chronic diarrhea No date: Chronic pain of right knee No date: Coronary artery disease No date: Dysphagia No date: Dysphasia No date: Dysuria No date: Family history of colon cancer 1980's: Gastric ulcer No date: Gastric ulcer No date: Gastroesophageal hernia No date: GERD (gastroesophageal reflux disease) No date: High cholesterol No date: Hyperlipidemia No date: Hypertension No date: Macular degeneration No date: Osteoporosis No date: Osteoporosis     Comment:  post-menopausal No date: Pedal edema No date: Psoriasis 05/02/2013: Schatzki's ring No date: Schatzki's  ring No date: Scoliosis No date: Scoliosis No date: UTI (urinary tract infection)  Past Surgical History: No date: ABDOMINAL HYSTERECTOMY No date: CARDIAC CATHETERIZATION No date: CHOLECYSTECTOMY No date: COLONOSCOPY 11/12/2014: COLONOSCOPY; N/A     Comment:  Procedure: COLONOSCOPY;  Surgeon: Manya Silvas, MD;               Location: [facility] ENDOSCOPY;  Service: Endoscopy;                Laterality: N/A; 05/24/2020: COLONOSCOPY; N/A     Comment:  Procedure: COLONOSCOPY;  Surgeon: Toledo, Benay Pike, MD;              Location: [facility] ENDOSCOPY;  Service: Endoscopy;                Laterality: N/A; No date: DIAGNOSTIC LAPAROSCOPY     Comment:  paraesphogeal hernia No date: ESOPHAGOGASTRODUODENOSCOPY 04/16/2017: ESOPHAGOGASTRODUODENOSCOPY (EGD) WITH PROPOFOL; N/A     Comment:  Procedure: ESOPHAGOGASTRODUODENOSCOPY (EGD) WITH               PROPOFOL;  Surgeon: Manya Silvas, MD;  Location:               [facility] ENDOSCOPY;  Service: Endoscopy;  Laterality: N/A; 02/10/2021: ESOPHAGOGASTRODUODENOSCOPY (EGD) WITH PROPOFOL; N/A     Comment:  Procedure: ESOPHAGOGASTRODUODENOSCOPY (EGD) WITH               PROPOFOL;  Surgeon: [name], MD;  Location: [facility]               ENDOSCOPY;  Service: Gastroenterology;  Laterality: N/A; 03/08/2021: GIVENS CAPSULE STUDY; N/A     Comment:  Procedure: GIVENS CAPSULE STUDY;  Surgeon: [name],  MD;  Location: [facility] ENDOSCOPY;  Service:               Gastroenterology;  Laterality: N/A; No date: HERNIA REPAIR     Comment:  inguinal hernia No date: HERNIA REPAIR     Comment:  umbilical hernia  BMI    Body Mass Index: 26.37 kg/m      Reproductive/Obstetrics negative OB ROS                             Anesthesia Physical Anesthesia Plan  ASA: 3  Anesthesia Plan: General   Post-op Pain Management:    Induction: Intravenous  PONV Risk Score and Plan: Propofol infusion and TIVA  Airway Management Planned:  Natural Airway  Additional Equipment:   Intra-op Plan:   Post-operative Plan:   Informed Consent: I have reviewed the patients History and Physical, chart, labs and discussed the procedure including the risks, benefits and alternatives for the proposed anesthesia with the patient or authorized representative who has indicated his/her understanding and acceptance.     Dental Advisory Given  Plan Discussed with: CRNA and Surgeon  Anesthesia Plan Comments:         Anesthesia Quick Evaluation

## 2021-11-28 NOTE — H&P (Signed)
Outpatient short stay form Pre-procedure 11/28/2021  Lesly Rubenstein, MD  Primary Physician: Leone Haven, MD  Reason for visit:  Dysphagia  History of present illness:    76 y/o lady with history of paraesophageal hernia repair here for EGD for dysphagia symptoms. No blood thinners. No family history of GI malignancies. History of cholecystectomy.    Current Facility-Administered Medications:    0.9 %  sodium chloride infusion, , Intravenous, Continuous, Iliyana Convey, Hilton Cork, MD, Last Rate: 20 mL/hr at 11/28/21 1056, New Bag at 11/28/21 1056  Medications Prior to Admission  Medication Sig Dispense Refill Last Dose   cholestyramine light (PREVALITE) 4 g packet Take 4 g by mouth 2 (two) times daily.   11/27/2021   Cyanocobalamin (VITAMIN B 12 PO) Take by mouth.   10/26/6220   folic acid (FOLVITE) 1 MG tablet Take 1 mg by mouth daily.   11/27/2021   ketoconazole (NIZORAL) 2 % cream Apply 1 application. topically daily.      magnesium oxide (MAG-OX) 400 MG tablet Take 1 tablet by mouth daily.   11/27/2021   meclizine (ANTIVERT) 25 MG tablet Take 25 mg by mouth 2 (two) times daily.      methotrexate (RHEUMATREX) 2.5 MG tablet Take by mouth.   Past Week   metoprolol tartrate (LOPRESSOR) 100 MG tablet Take 1 tablet (100 mg total) by mouth 2 (two) times daily. 180 tablet 2 11/27/2021   Multiple Vitamins-Minerals (PRESERVISION AREDS 2 PO) Take by mouth.   Past Week   Omega-3 Fatty Acids (OMEGA 3 500 PO) Take 1 capsule by mouth daily.    Past Month   pantoprazole (PROTONIX) 40 MG tablet Take 1 tablet (40 mg total) by mouth daily. 90 tablet 2 11/27/2021   potassium chloride (K-DUR) 10 MEQ tablet Take 10 mEq by mouth daily.   11/27/2021   sertraline (ZOLOFT) 50 MG tablet Take 1 tablet (50 mg total) by mouth daily. 90 tablet 3 11/27/2021   simvastatin (ZOCOR) 40 MG tablet TAKE 1 TABLET BY MOUTH ONCE DAILY AT  6PM 90 tablet 2 11/27/2021   clobetasol (OLUX) 0.05 % topical foam Apply topically 2  (two) times daily. 50 g 0    clobetasol ointment (TEMOVATE) 9.79 % Apply 1 application topically 2 (two) times daily as needed. For rash. Avoid face. 180 g 1    mupirocin ointment (BACTROBAN) 2 % Place 1 application into the nose 2 (two) times daily. 22 g 1    PREMARIN vaginal cream INSERT ONE APPLICATORFUL VAGINALLY TWICE WEEKLY 90 g 2    Probiotic Product (PROBIOTIC DAILY) CAPS Take 1 capsule by mouth. (Patient not taking: Reported on 11/28/2021)   Not Taking   SHINGRIX injection       triamcinolone ointment (KENALOG) 0.1 % Apply topically 2 (two) times daily. 240 g 0    valACYclovir (VALTREX) 500 MG tablet Take 1 tablet (500 mg total) by mouth 2 (two) times daily. 60 tablet 2      Allergies  Allergen Reactions   Gluten Meal Rash     Past Medical History:  Diagnosis Date   Abdominal pain    Adjustment disorder with anxiety    Anemia    Anxiety    adjustment disorder with anxiety   Arthritis    Atrophic vaginitis    BPPV (benign paroxysmal positional vertigo)    CAD in native artery    Cataract    Chronic diarrhea    Chronic pain of right knee    Coronary  artery disease    Dysphagia    Dysphasia    Dysuria    Family history of colon cancer    Gastric ulcer 1980's   Gastric ulcer    Gastroesophageal hernia    GERD (gastroesophageal reflux disease)    High cholesterol    Hyperlipidemia    Hypertension    Macular degeneration    Osteoporosis    Osteoporosis    post-menopausal   Pedal edema    Psoriasis    Schatzki's ring 05/02/2013   Schatzki's ring    Scoliosis    Scoliosis    UTI (urinary tract infection)     Review of systems:  Otherwise negative.    Physical Exam  Gen: Alert, oriented. Appears stated age.  HEENT: PERRLA. Lungs: No respiratory distress CV: RRR Abd: soft, benign, no masses Ext: No edema    Planned procedures: Proceed with EGD. The patient understands the nature of the planned procedure, indications, risks, alternatives and  potential complications including but not limited to bleeding, infection, perforation, damage to internal organs and possible oversedation/side effects from anesthesia. The patient agrees and gives consent to proceed.  Please refer to procedure notes for findings, recommendations and patient disposition/instructions.     Lesly Rubenstein, MD [facility] Gastroenterology

## 2021-11-28 NOTE — Anesthesia Postprocedure Evaluation (Signed)
Anesthesia Post Note  Patient: Lindsay Tran  Procedure(s) Performed: ESOPHAGOGASTRODUODENOSCOPY (EGD) WITH PROPOFOL  Patient location during evaluation: PACU Anesthesia Type: General Level of consciousness: awake and oriented Pain management: satisfactory to patient Vital Signs Assessment: post-procedure vital signs reviewed and stable Respiratory status: spontaneous breathing and respiratory function stable Cardiovascular status: stable Anesthetic complications: no   No notable events documented.   Last Vitals:  Vitals:   11/28/21 1235 11/28/21 1236  BP: (!) 190/95 (!) 188/92  Pulse: 68 68  Resp: 17 17  Temp:    SpO2: 96% 98%    Last Pain:  Vitals:   11/28/21 1236  TempSrc:   PainSc: 0-No pain                 VAN STAVEREN,Katai Marsico

## 2021-11-28 NOTE — Op Note (Signed)
[facility] Patient Name: Lindsay Tran Procedure Date: 11/28/2021 11:53 AM MRN: [id number] Account #: [id number] Date of Birth: 1946/04/14 Admit Type: Outpatient Age: 76 Room: [facility] ENDO ROOM 3 Gender: Female Note Status: Finalized Instrument Name: Altamese Cabal Endoscope [id number] Procedure:             Upper GI endoscopy Indications:           Dysphagia Providers:             Andrey Farmer MD, MD Referring MD:          Angela Adam. Caryl Bis (Referring MD) Medicines:             Monitored Anesthesia Care Complications:         No immediate complications. Estimated blood loss:                         Minimal. Procedure:             Pre-Anesthesia Assessment:                        - Prior to the procedure, a History and Physical was                         performed, and patient medications and allergies were                         reviewed. The patient is competent. The risks and                         benefits of the procedure and the sedation options and                         risks were discussed with the patient. All questions                         were answered and informed consent was obtained.                         Patient identification and proposed procedure were                         verified by the physician, the nurse, the                         anesthesiologist, the anesthetist and the technician                         in the endoscopy suite. Mental Status Examination:                         alert and oriented. Airway Examination: normal                         oropharyngeal airway and neck mobility. Respiratory                         Examination: clear to auscultation. CV Examination:  normal. Prophylactic Antibiotics: The patient does not                         require prophylactic antibiotics. Prior                         Anticoagulants: The patient has taken no previous                          anticoagulant or antiplatelet agents. ASA Grade                         Assessment: II - A patient with mild systemic disease.                         After reviewing the risks and benefits, the patient                         was deemed in satisfactory condition to undergo the                         procedure. The anesthesia plan was to use monitored                         anesthesia care (MAC). Immediately prior to                         administration of medications, the patient was                         re-assessed for adequacy to receive sedatives. The                         heart rate, respiratory rate, oxygen saturations,                         blood pressure, adequacy of pulmonary ventilation, and                         response to care were monitored throughout the                         procedure. The physical status of the patient was                         re-assessed after the procedure.                        After obtaining informed consent, the endoscope was                         passed under direct vision. Throughout the procedure,                         the patient's blood pressure, pulse, and oxygen                         saturations were monitored continuously. The Endoscope  was introduced through the mouth, and advanced to the                         second part of duodenum. The upper GI endoscopy was                         accomplished without difficulty. The patient tolerated                         the procedure well. Findings:      The examined esophagus was mildly tortuous.      No endoscopic abnormality was evident in the esophagus to explain the       patient's complaint of dysphagia. It was decided, however, to proceed       with dilation of the lower third of the esophagus. A TTS dilator was       passed through the scope. Dilation with a 15-16.5-18 mm balloon dilator       was performed to 18 mm. The dilation site was  examined and showed no       change. Estimated blood loss: none.      Evidence of a prior Nissen fundoplication was found in the       gastroesophageal junction.      A single 4 mm papule (nodule) with no stigmata of recent bleeding was       found in the gastric antrum. Biopsies were taken with a cold forceps for       histology. Estimated blood loss was minimal.      A large non-bleeding diverticulum was found in the second portion of the       duodenum.      The exam of the duodenum was otherwise normal. Impression:            - Tortuous esophagus.                        - No endoscopic esophageal abnormality to explain                         patient's dysphagia. Esophagus dilated. Dilated.                        - A Nissen fundoplication was found.                        - A single papule (nodule) found in the stomach.                         Biopsied.                        - Non-bleeding duodenal diverticulum. Recommendation:        - Discharge patient to home.                        - Resume previous diet.                        - Continue present medications.                        - Await pathology results.                        -  Return to referring physician as previously                         scheduled. Procedure Code(s):     --- Professional ---                        [phone], Esophagogastroduodenoscopy, flexible,                         transoral; with transendoscopic balloon dilation of                         esophagus (less than 30 mm diameter) Diagnosis Code(s):     --- Professional ---                        Q39.9, Congenital malformation of esophagus,                         unspecified                        R13.10, Dysphagia, unspecified                        Z98.890, Other specified postprocedural states                        K31.89, Other diseases of stomach and duodenum                        K57.10, Diverticulosis of small intestine without                          perforation or abscess without bleeding CPT copyright 2019 [employer]. All rights reserved. The codes documented in this report are preliminary and upon coder review may  be revised to meet current compliance requirements. Andrey Farmer MD, MD 11/28/2021 12:15:11 PM Number of Addenda: 0 Note Initiated On: 11/28/2021 11:53 AM Estimated Blood Loss:  Estimated blood loss was minimal.      [facility]

## 2021-11-28 NOTE — Transfer of Care (Signed)
Immediate Anesthesia Transfer of Care Note  Patient: Lindsay Tran  Procedure(s) Performed: ESOPHAGOGASTRODUODENOSCOPY (EGD) WITH PROPOFOL  Patient Location: Endoscopy Unit  Anesthesia Type:General  Level of Consciousness: drowsy and patient cooperative  Airway & Oxygen Therapy: Patient Spontanous Breathing and Patient connected to face mask oxygen  Post-op Assessment: Report given to RN and Post -op Vital signs reviewed and stable  Post vital signs: Reviewed and stable  Last Vitals:  Vitals Value Taken Time  BP 144/83 11/28/21 1214  Temp 35.8 C 11/28/21 1215  Pulse 72 11/28/21 1215  Resp 19 11/28/21 1215  SpO2 98 % 11/28/21 1215  Vitals shown include unvalidated device data.  Last Pain:  Vitals:   11/28/21 1215  TempSrc: Temporal  PainSc:          Complications: No notable events documented.

## 2021-11-28 NOTE — Anesthesia Procedure Notes (Signed)
Procedure Name: General with mask airway Date/Time: 11/28/2021 11:57 AM Performed by: Kelton Pillar, CRNA Pre-anesthesia Checklist: Patient identified, Emergency Drugs available, Suction available and Patient being monitored Patient Re-evaluated:Patient Re-evaluated prior to induction Oxygen Delivery Method: Simple face mask Induction Type: IV induction Placement Confirmation: positive ETCO2 and CO2 detector Dental Injury: Teeth and Oropharynx as per pre-operative assessment

## 2021-11-29 ENCOUNTER — Encounter: Payer: Self-pay | Admitting: Gastroenterology

## 2021-11-29 DIAGNOSIS — M170 Bilateral primary osteoarthritis of knee: Secondary | ICD-10-CM | POA: Diagnosis not present

## 2021-11-29 DIAGNOSIS — L4050 Arthropathic psoriasis, unspecified: Secondary | ICD-10-CM | POA: Diagnosis not present

## 2021-11-29 DIAGNOSIS — Z7960 Long term (current) use of unspecified immunomodulators and immunosuppressants: Secondary | ICD-10-CM | POA: Diagnosis not present

## 2021-11-29 DIAGNOSIS — M7061 Trochanteric bursitis, right hip: Secondary | ICD-10-CM | POA: Diagnosis not present

## 2021-11-29 DIAGNOSIS — L409 Psoriasis, unspecified: Secondary | ICD-10-CM | POA: Diagnosis not present

## 2021-11-30 LAB — SURGICAL PATHOLOGY

## 2021-12-02 ENCOUNTER — Ambulatory Visit (INDEPENDENT_AMBULATORY_CARE_PROVIDER_SITE_OTHER): Payer: PPO | Admitting: Nurse Practitioner

## 2021-12-02 ENCOUNTER — Encounter (INDEPENDENT_AMBULATORY_CARE_PROVIDER_SITE_OTHER): Payer: Self-pay | Admitting: Nurse Practitioner

## 2021-12-02 VITALS — BP 149/77 | HR 58 | Resp 16 | Ht 60.0 in | Wt 138.0 lb

## 2021-12-02 DIAGNOSIS — I83812 Varicose veins of left lower extremities with pain: Secondary | ICD-10-CM

## 2021-12-05 ENCOUNTER — Encounter (INDEPENDENT_AMBULATORY_CARE_PROVIDER_SITE_OTHER): Payer: Self-pay | Admitting: Nurse Practitioner

## 2021-12-05 NOTE — Progress Notes (Signed)
Varicose veins of left lower extremity with inflammation (454.1  I83.10) Current Plans   Indication: Patient presents with symptomatic varicose veins of the left lower extremity.   Procedure: Sclerotherapy using hypertonic saline mixed with 1% Lidocaine was performed on the left lower extremity. Compression wraps were placed. The patient tolerated the procedure well. 

## 2021-12-20 ENCOUNTER — Ambulatory Visit (INDEPENDENT_AMBULATORY_CARE_PROVIDER_SITE_OTHER): Payer: PPO | Admitting: Family Medicine

## 2021-12-20 ENCOUNTER — Ambulatory Visit: Payer: PPO | Admitting: Family Medicine

## 2021-12-20 ENCOUNTER — Other Ambulatory Visit: Payer: Self-pay

## 2021-12-20 ENCOUNTER — Inpatient Hospital Stay: Payer: PPO | Attending: Oncology

## 2021-12-20 ENCOUNTER — Ambulatory Visit (INDEPENDENT_AMBULATORY_CARE_PROVIDER_SITE_OTHER): Payer: PPO

## 2021-12-20 ENCOUNTER — Encounter: Payer: Self-pay | Admitting: Family Medicine

## 2021-12-20 VITALS — BP 120/80 | HR 59 | Temp 97.7°F | Ht 60.0 in | Wt 138.0 lb

## 2021-12-20 DIAGNOSIS — G250 Essential tremor: Secondary | ICD-10-CM

## 2021-12-20 DIAGNOSIS — I2510 Atherosclerotic heart disease of native coronary artery without angina pectoris: Secondary | ICD-10-CM | POA: Diagnosis not present

## 2021-12-20 DIAGNOSIS — F32A Depression, unspecified: Secondary | ICD-10-CM

## 2021-12-20 DIAGNOSIS — D509 Iron deficiency anemia, unspecified: Secondary | ICD-10-CM | POA: Insufficient documentation

## 2021-12-20 DIAGNOSIS — M791 Myalgia, unspecified site: Secondary | ICD-10-CM | POA: Insufficient documentation

## 2021-12-20 DIAGNOSIS — R252 Cramp and spasm: Secondary | ICD-10-CM | POA: Diagnosis not present

## 2021-12-20 DIAGNOSIS — R0609 Other forms of dyspnea: Secondary | ICD-10-CM

## 2021-12-20 DIAGNOSIS — I10 Essential (primary) hypertension: Secondary | ICD-10-CM

## 2021-12-20 DIAGNOSIS — R0602 Shortness of breath: Secondary | ICD-10-CM | POA: Diagnosis not present

## 2021-12-20 DIAGNOSIS — R29898 Other symptoms and signs involving the musculoskeletal system: Secondary | ICD-10-CM | POA: Diagnosis not present

## 2021-12-20 DIAGNOSIS — R079 Chest pain, unspecified: Secondary | ICD-10-CM | POA: Diagnosis not present

## 2021-12-20 DIAGNOSIS — F419 Anxiety disorder, unspecified: Secondary | ICD-10-CM | POA: Diagnosis not present

## 2021-12-20 DIAGNOSIS — E611 Iron deficiency: Secondary | ICD-10-CM

## 2021-12-20 LAB — TSH: TSH: 2.56 u[IU]/mL (ref 0.35–5.50)

## 2021-12-20 LAB — CBC
HCT: 44 % (ref 36.0–46.0)
Hemoglobin: 14.6 g/dL (ref 12.0–15.0)
MCHC: 33.1 g/dL (ref 30.0–36.0)
MCV: 94 fl (ref 78.0–100.0)
Platelets: 275 10*3/uL (ref 150.0–400.0)
RBC: 4.68 Mil/uL (ref 3.87–5.11)
RDW: 13.9 % (ref 11.5–15.5)
WBC: 7.2 10*3/uL (ref 4.0–10.5)

## 2021-12-20 LAB — BASIC METABOLIC PANEL
BUN: 21 mg/dL (ref 6–23)
CO2: 25 mEq/L (ref 19–32)
Calcium: 9.6 mg/dL (ref 8.4–10.5)
Chloride: 100 mEq/L (ref 96–112)
Creatinine, Ser: 0.69 mg/dL (ref 0.40–1.20)
GFR: 83.17 mL/min (ref >60.00)
Glucose, Bld: 89 mg/dL (ref 70–99)
Potassium: 4.3 mEq/L (ref 3.5–5.1)
Sodium: 135 mEq/L (ref 135–145)

## 2021-12-20 LAB — CBC WITH DIFFERENTIAL/PLATELET
Abs Immature Granulocytes: 0.03 10*3/uL (ref 0.00–0.07)
Basophils Absolute: 0 10*3/uL (ref 0.0–0.1)
Basophils Relative: 0 %
Eosinophils Absolute: 0.1 10*3/uL (ref 0.0–0.5)
Eosinophils Relative: 2 %
HCT: 44.3 % (ref 36.0–46.0)
Hemoglobin: 14.5 g/dL (ref 12.0–15.0)
Immature Granulocytes: 0 %
Lymphocytes Relative: 20 %
Lymphs Abs: 1.5 10*3/uL (ref 0.7–4.0)
MCH: 30.9 pg (ref 26.0–34.0)
MCHC: 32.7 g/dL (ref 30.0–36.0)
MCV: 94.5 fL (ref 80.0–100.0)
Monocytes Absolute: 0.6 10*3/uL (ref 0.1–1.0)
Monocytes Relative: 8 %
Neutro Abs: 5.3 10*3/uL (ref 1.7–7.7)
Neutrophils Relative %: 70 %
Platelets: 232 10*3/uL (ref 150–400)
RBC: 4.69 MIL/uL (ref 3.87–5.11)
RDW: 13.3 % (ref 11.5–15.5)
WBC: 7.7 10*3/uL (ref 4.0–10.5)
nRBC: 0 % (ref 0.0–0.2)

## 2021-12-20 LAB — CK: Total CK: 37 U/L (ref 7–177)

## 2021-12-20 LAB — IRON AND TIBC
Iron: 115 ug/dL (ref 28–170)
Saturation Ratios: 35 % — ABNORMAL HIGH (ref 10.4–31.8)
TIBC: 329 ug/dL (ref 250–450)
UIBC: 214 ug/dL

## 2021-12-20 LAB — FERRITIN: Ferritin: 218 ng/mL (ref 11–307)

## 2021-12-20 LAB — MAGNESIUM: Magnesium: 1.7 mg/dL (ref 1.5–2.5)

## 2021-12-20 MED ORDER — SERTRALINE HCL 100 MG PO TABS: 100.0000 mg | ORAL_TABLET | Freq: Every day | ORAL | 1 refills | Status: DC

## 2021-12-20 NOTE — Assessment & Plan Note (Signed)
Possibly related to her simvastatin.  We will have her discontinue that for 1 to 2 weeks and see if this improves.  If it does improve she will let me know so we can switch her to a different statin.  If it does not improve with stopping the statin she will restart her simvastatin.

## 2021-12-20 NOTE — Assessment & Plan Note (Signed)
I suspect this is contributing to her balance issues.  I will refer her to neurology to get their input on medication management as well as if any further work-up needs to be completed.

## 2021-12-20 NOTE — Assessment & Plan Note (Signed)
Patient reports feeling as though her legs are bilaterally weak.  We will refer for physical therapy.  They can also help with her balance.

## 2021-12-20 NOTE — Progress Notes (Signed)
Tommi Rumps, MD Phone: [phone]  Lindsay Tran is a 76 y.o. female who presents today for follow-up.  Chest pain/shortness of breath: Patient notes this has been going on for a while.  She gets chest tightness intermittently on exertion.  She also has shortness of breath on exertion.  She notes occasional edema in both ankles though she has had multiple vein procedures on her legs previously.  The ankle edema does not always resolve overnight though sometimes it does.  Balance issues/essential tremor: Patient has ongoing issues with essential tremor that she feels have worsened.  She notes some balance issues where she feels like she walks like a drunk.  She notes both of these issues have been going on for years.  She would like to see neurology at this time.  Anxiety/depression: Patient reports these are better with the Zoloft though are still not good.  She has no SI or HI.  Hyperlipidemia: Currently taking simvastatin.  She had 1 episode of right upper quadrant discomfort 4 months ago before she was started on treatment for SIBO.  She does note her legs have had myalgias for the last 4 months.  Those are worse with sitting.  They are not exacerbated by ambulation.  She does note some leg cramps.  She has been taking magnesium and potassium over-the-counter.  Social History   Tobacco Use  Smoking Status Never  Smokeless Tobacco Never    Current Outpatient Medications on File Prior to Visit  Medication Sig Dispense Refill   cholestyramine light (PREVALITE) 4 g packet Take 4 g by mouth 2 (two) times daily.     clobetasol (OLUX) 0.05 % topical foam Apply topically 2 (two) times daily. 50 g 0   clobetasol ointment (TEMOVATE) 4.74 % Apply 1 application topically 2 (two) times daily as needed. For rash. Avoid face. 180 g 1   Cyanocobalamin (VITAMIN B 12 PO) Take by mouth.     ketoconazole (NIZORAL) 2 % cream Apply 1 application. topically daily.     leflunomide (ARAVA) 10 MG  tablet Take 1 tablet by mouth daily.     magnesium oxide (MAG-OX) 400 MG tablet Take 1 tablet by mouth daily.     meclizine (ANTIVERT) 25 MG tablet Take 25 mg by mouth 2 (two) times daily.     metoprolol tartrate (LOPRESSOR) 100 MG tablet Take 1 tablet (100 mg total) by mouth 2 (two) times daily. 180 tablet 2   Multiple Vitamins-Minerals (PRESERVISION AREDS 2 PO) Take by mouth.     mupirocin ointment (BACTROBAN) 2 % Place 1 application into the nose 2 (two) times daily. 22 g 1   Omega-3 Fatty Acids (OMEGA 3 500 PO) Take 1 capsule by mouth daily.      pantoprazole (PROTONIX) 40 MG tablet Take 1 tablet (40 mg total) by mouth daily. 90 tablet 2   potassium chloride (K-DUR) 10 MEQ tablet Take 10 mEq by mouth daily.     PREMARIN vaginal cream INSERT ONE APPLICATORFUL VAGINALLY TWICE WEEKLY 90 g 2   SHINGRIX injection      simvastatin (ZOCOR) 40 MG tablet TAKE 1 TABLET BY MOUTH ONCE DAILY AT  6PM 90 tablet 2   triamcinolone ointment (KENALOG) 0.1 % Apply topically 2 (two) times daily. 240 g 0   valACYclovir (VALTREX) 500 MG tablet Take 1 tablet (500 mg total) by mouth 2 (two) times daily. 60 tablet 2   No current facility-administered medications on file prior to visit.     ROS see  history of present illness  Objective  Physical Exam Vitals:   12/20/21 1014  BP: 120/80  Pulse: (!) 59  Temp: 97.7 F (36.5 C)  SpO2: 97%    BP Readings from Last 3 Encounters:  12/20/21 120/80  12/02/21 (!) 149/77  11/28/21 (!) 188/92   Wt Readings from Last 3 Encounters:  12/20/21 138 lb (62.6 kg)  12/02/21 138 lb (62.6 kg)  11/28/21 135 lb (61.2 kg)    Physical Exam Constitutional:      General: She is not in acute distress.    Appearance: She is not diaphoretic.  Cardiovascular:     Rate and Rhythm: Normal rate and regular rhythm.     Heart sounds: Normal heart sounds.  Pulmonary:     Effort: Pulmonary effort is normal.     Breath sounds: Normal breath sounds.  Skin:    General: Skin  is warm and dry.  Neurological:     Mental Status: She is alert.     Comments: Patient is able to rise from a seated position in a chair on her own    EKG: Sinus bradycardia, rate 59, no ischemic changes noted  Assessment/Plan: Please see individual problem list.  Problem List Items Addressed This Visit     Anxiety and depression (Chronic)    Some improvement though still having some symptoms of anxiety and depression.  We will increase her Zoloft to 100 mg daily.      Relevant Medications   sertraline (ZOLOFT) 100 MG tablet   Benign essential HTN (Chronic)    Well-controlled.  She will continue metoprolol 100 mg twice daily.      CAD in native artery (Chronic)    Patient has had some exertional chest pain and shortness of breath that is concerning for a cardiac cause.  EKG today is reassuring.  We will check a chest x-ray to evaluate for an underlying cause there.  We will check lab work as outlined.  I will refer to cardiology.  She was advised to seek medical attention if she has persistent chest pain or shortness of breath.      Essential tremor (Chronic)    I suspect this is contributing to her balance issues.  I will refer her to neurology to get their input on medication management as well as if any further work-up needs to be completed.      Relevant Orders   Ambulatory referral to Neurology   Leg weakness, bilateral    Patient reports feeling as though her legs are bilaterally weak.  We will refer for physical therapy.  They can also help with her balance.      Relevant Orders   Ambulatory referral to Physical Therapy   Muscle cramps    Check lab work.      Relevant Orders   Magnesium   Basic Metabolic Panel (BMET)   Myalgia    Possibly related to her simvastatin.  We will have her discontinue that for 1 to 2 weeks and see if this improves.  If it does improve she will let me know so we can switch her to a different statin.  If it does not improve with stopping  the statin she will restart her simvastatin.      Relevant Orders   CK (Creatine Kinase)   Other Visit Diagnoses     Exertional chest pain    -  Primary   Relevant Orders   EKG 12-Lead (Completed)   TSH   CBC  Ambulatory referral to Cardiology   Exertional dyspnea       Relevant Orders   DG Chest 2 View   CBC   Ambulatory referral to Cardiology       Return in about 3 months (around 03/22/2022) for Anxiety/depression/chest pain/shortness of breath follow-up.  I have spent 41 minutes in the care of this patient regarding history taking, documentation, completion of exam, interpretation of EKG, placing orders, discussion of plan.   Tommi Rumps, MD [place]

## 2021-12-20 NOTE — Assessment & Plan Note (Signed)
Check lab work. 

## 2021-12-20 NOTE — Patient Instructions (Signed)
Nice to see you. We will get lab work and a chest x-ray today and contact you with the results. Physical therapy should contact you to schedule an appointment. Neurology and cardiology should also contact you.  If you do not hear from any of these people within the next 1 to 2 weeks please let us know. If you have persistent chest pain or shortness of breath you need to seek medical attention in the emergency department.

## 2021-12-20 NOTE — Assessment & Plan Note (Signed)
Patient has had some exertional chest pain and shortness of breath that is concerning for a cardiac cause.  EKG today is reassuring.  We will check a chest x-ray to evaluate for an underlying cause there.  We will check lab work as outlined.  I will refer to cardiology.  She was advised to seek medical attention if she has persistent chest pain or shortness of breath.

## 2021-12-20 NOTE — Assessment & Plan Note (Signed)
Some improvement though still having some symptoms of anxiety and depression.  We will increase her Zoloft to 100 mg daily.

## 2021-12-20 NOTE — Assessment & Plan Note (Signed)
Well-controlled.  She will continue metoprolol 100 mg twice daily.

## 2021-12-21 ENCOUNTER — Other Ambulatory Visit: Payer: Self-pay | Admitting: Family Medicine

## 2021-12-23 ENCOUNTER — Ambulatory Visit: Payer: PPO | Admitting: Family Medicine

## 2022-01-11 DIAGNOSIS — R2681 Unsteadiness on feet: Secondary | ICD-10-CM | POA: Diagnosis not present

## 2022-01-13 ENCOUNTER — Encounter: Payer: Self-pay | Admitting: Family Medicine

## 2022-01-13 DIAGNOSIS — G250 Essential tremor: Secondary | ICD-10-CM

## 2022-01-17 DIAGNOSIS — R2681 Unsteadiness on feet: Secondary | ICD-10-CM | POA: Diagnosis not present

## 2022-01-20 ENCOUNTER — Ambulatory Visit (INDEPENDENT_AMBULATORY_CARE_PROVIDER_SITE_OTHER): Payer: PPO | Admitting: Nurse Practitioner

## 2022-01-25 DIAGNOSIS — R2681 Unsteadiness on feet: Secondary | ICD-10-CM | POA: Diagnosis not present

## 2022-01-26 DIAGNOSIS — R140 Abdominal distension (gaseous): Secondary | ICD-10-CM | POA: Diagnosis not present

## 2022-01-26 DIAGNOSIS — K580 Irritable bowel syndrome with diarrhea: Secondary | ICD-10-CM | POA: Diagnosis not present

## 2022-01-26 DIAGNOSIS — K219 Gastro-esophageal reflux disease without esophagitis: Secondary | ICD-10-CM | POA: Diagnosis not present

## 2022-01-26 DIAGNOSIS — K6389 Other specified diseases of intestine: Secondary | ICD-10-CM | POA: Diagnosis not present

## 2022-01-27 DIAGNOSIS — R2681 Unsteadiness on feet: Secondary | ICD-10-CM | POA: Diagnosis not present

## 2022-02-01 DIAGNOSIS — R2681 Unsteadiness on feet: Secondary | ICD-10-CM | POA: Diagnosis not present

## 2022-02-03 DIAGNOSIS — R2681 Unsteadiness on feet: Secondary | ICD-10-CM | POA: Diagnosis not present

## 2022-02-06 DIAGNOSIS — R2681 Unsteadiness on feet: Secondary | ICD-10-CM | POA: Diagnosis not present

## 2022-02-08 ENCOUNTER — Ambulatory Visit (INDEPENDENT_AMBULATORY_CARE_PROVIDER_SITE_OTHER): Payer: PPO

## 2022-02-08 ENCOUNTER — Ambulatory Visit: Payer: PPO | Admitting: Internal Medicine

## 2022-02-08 ENCOUNTER — Encounter: Payer: Self-pay | Admitting: Internal Medicine

## 2022-02-08 VITALS — BP 140/100 | HR 62 | Ht 60.0 in | Wt 137.6 lb

## 2022-02-08 DIAGNOSIS — R0789 Other chest pain: Secondary | ICD-10-CM | POA: Insufficient documentation

## 2022-02-08 DIAGNOSIS — R0602 Shortness of breath: Secondary | ICD-10-CM | POA: Insufficient documentation

## 2022-02-08 DIAGNOSIS — R002 Palpitations: Secondary | ICD-10-CM

## 2022-02-08 DIAGNOSIS — M7989 Other specified soft tissue disorders: Secondary | ICD-10-CM

## 2022-02-08 DIAGNOSIS — I10 Essential (primary) hypertension: Secondary | ICD-10-CM | POA: Diagnosis not present

## 2022-02-08 DIAGNOSIS — R011 Cardiac murmur, unspecified: Secondary | ICD-10-CM

## 2022-02-08 LAB — ECHOCARDIOGRAM COMPLETE
AR max vel: 2.67 cm2
AV Area VTI: 2.7 cm2
AV Area mean vel: 2.53 cm2
AV Mean grad: 2 mmHg
AV Peak grad: 3.2 mmHg
Ao pk vel: 0.89 m/s
Area-P 1/2: 3.4 cm2
Calc EF: 46.9 %
Height: 60 in
S' Lateral: 2.9 cm
Single Plane A2C EF: 43.2 %
Single Plane A4C EF: 47.9 %
Weight: 2201.6 oz

## 2022-02-08 NOTE — Progress Notes (Signed)
New Outpatient Visit Date: 02/08/2022  Referring Provider: Leone Haven, MD [address] [place],  [place] [address]  Chief Complaint: Chest pain and shortness of breath  HPI:  [name] is a 76 y.o. female who is being seen today for the evaluation of chest pain and shortness of breath at the request of Dr. Caryl Bis. She has a history of coronary artery calcification, hypertension, hyperlipidemia, varicose veins, hiatal hernia, peptic ulcer disease, iron deficiency anemia, essential tremor, scoliosis, and psoriatic arthritis.  Lindsay Tran reports that she underwent quite a bit of heart testing about 10 years ago through Dr. Humphrey Rolls.  She was told that she has coronary artery calcification and a "leaking" heart valve.  She has not had any further cardiac evaluation since that time.  Around that time, she was also diagnosed with a hiatal hernia.  She underwent repair of this and experienced resolution of chest pain and shortness of breath that had been bothering her at that time.  She continues to be followed closely by GI and was recently told that she has esophageal spasm.  She was advised that she may benefit from calcium channel blocker therapy but is concerned about the safety of using this with her history of cardiac problems.  Over the last few months, Lindsay Tran has been bothered by several things including intermittent chest pain that she describes as a pressure radiating to the left side of her chest.  It usually happens at night and lasts about an hour.  She reports some associated dyspnea and a "nervous" feeling.  She is also concerned about palpitations.  She is unsure if the shaking feeling she feels inside her body is due to an abnormal heart rhythm or her tremor.  She has been diagnosed with essential tremor but feels like it is getting worse despite beta-blocker therapy.  She has a family history of Parkinson's disease and is scheduled for neurology consultation later this  month.  She has not had any lightheadedness, syncope, or falls.  Lindsay Tran reports a recent chest x-ray for work-up of shortness of breath showing an enlarged heart (report from 12/20/2021 in epic indicates no active cardiopulmonary disease; cardiac contour is upper limits of normal).  She was recently taken off of simvastatin due to gait instability and feels like her leg strength and balance have improved.  She complains of chronic dependent edema that is fairly well controlled with compression stockings.  --------------------------------------------------------------------------------------------------  Cardiovascular History & Procedures: Cardiovascular Problems: Chest pain Shortness of breath Heart murmur Coronary artery calcification  Risk Factors: Coronary artery calcification, hypertension, hyperlipidemia, and age greater than 21  Cath/PCI: None  CV Surgery: None  EP Procedures and Devices: None  Non-Invasive Evaluation(s): None available  Recent CV Pertinent Labs: Lab Results  Component Value Date   CHOL 153 08/04/2021   HDL 60.80 08/04/2021   LDLCALC 66 08/04/2021   TRIG 131.0 08/04/2021   CHOLHDL 3 08/04/2021   K 4.3 12/20/2021   MG 1.7 12/20/2021   BUN 21 12/20/2021   CREATININE 0.69 12/20/2021    --------------------------------------------------------------------------------------------------  Past Medical History:  Diagnosis Date   Abdominal pain    Adjustment disorder with anxiety    Anemia    Anxiety    adjustment disorder with anxiety   Arthritis    Atrophic vaginitis    BPPV (benign paroxysmal positional vertigo)    CAD in native artery    Cataract    Chronic diarrhea    Chronic pain of  right knee    Coronary artery disease    Dysphagia    Dysphasia    Dysuria    Family history of colon cancer    Gastric ulcer 1980's   Gastric ulcer    Gastroesophageal hernia    GERD (gastroesophageal reflux disease)    High cholesterol     Hyperlipidemia    Hypertension    Macular degeneration    Osteoporosis    Osteoporosis    post-menopausal   Pedal edema    Psoriasis    Schatzki's ring 05/02/2013   Schatzki's ring    Scoliosis    Scoliosis    UTI (urinary tract infection)     Past Surgical History:  Procedure Laterality Date   ABDOMINAL HYSTERECTOMY     CARDIAC CATHETERIZATION     CHOLECYSTECTOMY     COLONOSCOPY     COLONOSCOPY N/A 11/12/2014   Procedure: COLONOSCOPY;  Surgeon: Manya Silvas, MD;  Location: [facility];  Service: Endoscopy;  Laterality: N/A;   COLONOSCOPY N/A 05/24/2020   Procedure: COLONOSCOPY;  Surgeon: Toledo, Benay Pike, MD;  Location: [facility] ENDOSCOPY;  Service: Endoscopy;  Laterality: N/A;   DIAGNOSTIC LAPAROSCOPY     paraesphogeal hernia   ESOPHAGOGASTRODUODENOSCOPY     ESOPHAGOGASTRODUODENOSCOPY (EGD) WITH PROPOFOL N/A 04/16/2017   Procedure: ESOPHAGOGASTRODUODENOSCOPY (EGD) WITH PROPOFOL;  Surgeon: Manya Silvas, MD;  Location: [facility] ENDOSCOPY;  Service: Endoscopy;  Laterality: N/A;   ESOPHAGOGASTRODUODENOSCOPY (EGD) WITH PROPOFOL N/A 02/10/2021   Procedure: ESOPHAGOGASTRODUODENOSCOPY (EGD) WITH PROPOFOL;  Surgeon: [name], MD;  Location: [facility] ENDOSCOPY;  Service: Gastroenterology;  Laterality: N/A;   ESOPHAGOGASTRODUODENOSCOPY (EGD) WITH PROPOFOL N/A 11/28/2021   Procedure: ESOPHAGOGASTRODUODENOSCOPY (EGD) WITH PROPOFOL;  Surgeon: Lesly Rubenstein, MD;  Location: [facility] ENDOSCOPY;  Service: Endoscopy;  Laterality: N/A;   GIVENS CAPSULE STUDY N/A 03/08/2021   Procedure: GIVENS CAPSULE STUDY;  Surgeon: [name], MD;  Location: [facility] ENDOSCOPY;  Service: Gastroenterology;  Laterality: N/A;   HERNIA REPAIR     inguinal hernia   HERNIA REPAIR     umbilical hernia    Current Meds  Medication Sig   cholestyramine light (PREVALITE) 4 g packet Take 4 g by mouth 2 (two) times daily.   clobetasol (OLUX) 0.05 % topical foam Apply topically 2 (two) times daily.   clobetasol ointment  (TEMOVATE) 1.61 % Apply 1 application topically 2 (two) times daily as needed. For rash. Avoid face.   Cyanocobalamin (VITAMIN B 12 PO) Take by mouth daily.   ketoconazole (NIZORAL) 2 % cream Apply 1 application. topically daily.   leflunomide (ARAVA) 10 MG tablet Take 1 tablet by mouth daily.   magnesium oxide (MAG-OX) 400 MG tablet Take 1 tablet by mouth daily.   meclizine (ANTIVERT) 25 MG tablet Take 25 mg by mouth 2 (two) times daily.   metoprolol tartrate (LOPRESSOR) 100 MG tablet Take 1 tablet (100 mg total) by mouth 2 (two) times daily.   Multiple Vitamins-Minerals (PRESERVISION AREDS 2 PO) Take by mouth daily.   mupirocin ointment (BACTROBAN) 2 % Place 1 application into the nose 2 (two) times daily.   Omega-3 Fatty Acids (OMEGA 3 500 PO) Take 1 capsule by mouth daily.    pantoprazole (PROTONIX) 40 MG tablet Take 1 tablet (40 mg total) by mouth daily.   potassium chloride (K-DUR) 10 MEQ tablet Take 10 mEq by mouth daily.   PREMARIN vaginal cream INSERT ONE APPLICATORFUL VAGINALLY TWICE WEEKLY   sertraline (ZOLOFT) 100 MG tablet Take 1 tablet (100 mg total) by mouth  daily.   triamcinolone ointment (KENALOG) 0.1 % Apply topically 2 (two) times daily.   valACYclovir (VALTREX) 500 MG tablet Take 1 tablet (500 mg total) by mouth 2 (two) times daily.   [DISCONTINUED] SHINGRIX injection     Allergies: Gluten meal  Social History   Tobacco Use   Smoking status: Never   Smokeless tobacco: Never  Vaping Use   Vaping Use: Never used  Substance Use Topics   Alcohol use: No    Alcohol/week: 0.0 standard drinks of alcohol   Drug use: No    Family History  Problem Relation Age of Onset   Diabetes Mother    Parkinson's disease Mother    Cancer Father        Pancreatic   Cancer Sister        Lung   Diabetes Sister    Diabetes Sister    Alzheimer's disease Sister    Tics Sister    Stroke Brother    Restless legs syndrome Brother    Alzheimer's disease Paternal Aunt        x2  sisters   Diverticulosis Daughter    Breast cancer Neg Hx     Review of Systems: A 12-system review of systems was performed and was negative except as noted in the HPI.  --------------------------------------------------------------------------------------------------  Physical Exam: BP (!) 140/100 (BP Location: Right Arm, Patient Position: Sitting, Cuff Size: Normal)   Pulse 62   Ht 5' (1.524 m)   Wt 137 lb 9.6 oz (62.4 kg)   SpO2 95%   BMI 26.87 kg/m   General: NAD. HEENT: No conjunctival pallor or scleral icterus. Facemask in place. Neck: Supple without lymphadenopathy, thyromegaly, JVD, or HJR. No carotid bruit. Lungs: Normal work of breathing. Clear to auscultation bilaterally without wheezes or crackles. Heart: Regular rate and rhythm with 1/6 systolic murmur loudest at the right upper sternal border.  No rubs or gallops. Non-displaced PMI. Abd: Bowel sounds present. Soft, NT/ND without hepatosplenomegaly Ext: No lower extremity edema with compression stockings in place. Radial, PT, and DP pulses are 2+ bilaterally Skin: Warm and dry without rash. Psych: Normal mood and affect.  EKG: Normal sinus rhythm without abnormality.  Lab Results  Component Value Date   WBC 7.7 12/20/2021   HGB 14.5 12/20/2021   HCT 44.3 12/20/2021   MCV 94.5 12/20/2021   PLT 232 12/20/2021    Lab Results  Component Value Date   NA 135 12/20/2021   K 4.3 12/20/2021   CL 100 12/20/2021   CO2 25 12/20/2021   BUN 21 12/20/2021   CREATININE 0.69 12/20/2021   GLUCOSE 89 12/20/2021   ALT 14 08/04/2021    Lab Results  Component Value Date   CHOL 153 08/04/2021   HDL 60.80 08/04/2021   LDLCALC 66 08/04/2021   TRIG 131.0 08/04/2021   CHOLHDL 3 08/04/2021   Lab Results  Component Value Date   TSH 2.56 12/20/2021    --------------------------------------------------------------------------------------------------  ASSESSMENT AND PLAN: Chest pain, shortness of breath, leg  swelling, and heart murmur: Chest pain is atypical, occurring mostly at night.  Given similar symptoms in the past that improved with esophageal hernia repair as well as reported diagnosis of esophageal spasm, I suspect that GI pathology may underlie her symptoms.  Her physical exam today is notable for a soft systolic murmur.  EKG is normal.  We have agreed to obtain a transthoracic echocardiogram for further assessment.  If calcium channel blocker therapy is felt to be appropriate by GI,  I think it is reasonable to pursue this.  I would favor use of a dihydropyridine calcium channel blocker such as amlodipine, if appropriate, in order to lessen the risk for bradycardia that could be seen with other agents like diltiazem or verapamil.  If diltiazem or verapamil are necessary from a GI standpoint, de-escalation of metoprolol will need to be considered in order to prevent bradycardia.  Palpitations: It is unclear to me if Ms. courser is describing frank palpitations or is aware of her underlying tremor in her chest.  We have agreed to obtain a 14-day event monitor for further characterization.  She should continue her current dose of metoprolol and also follow-up with neurology as scheduled later this month for further evaluation of her tremor.  Hypertension: Blood pressure mildly elevated today.  We will defer medication changes at this time.  Follow-up: Return to clinic in 6 weeks.  Nelva Bush, MD 02/08/2022 10:06 AM

## 2022-02-08 NOTE — Patient Instructions (Signed)
Medication Instructions:   Your physician recommends that you continue on your current medications as directed. Please refer to the Current Medication list given to you today.  *If you need a refill on your cardiac medications before your next appointment, please call your pharmacy*   Lab Work:  None ordered  Testing/Procedures:  1) Your physician has requested that you have an echocardiogram. Echocardiography is a painless test that uses sound waves to create images of your heart. It provides your doctor with information about the size and shape of your heart and how well your heart's chambers and valves are working. This procedure takes approximately one hour. There are no restrictions for this procedure.  2) Your physician has recommended that you wear a Zio XT monitor for TWO WEEKS. This will be mailed to you.   This monitor is a medical device that records the heart's electrical activity. Doctors most often use these monitors to diagnose arrhythmias. Arrhythmias are problems with the speed or rhythm of the heartbeat. The monitor is a small device applied to your chest. You can wear one while you do your normal daily activities. While wearing this monitor if you have any symptoms to push the button and record what you felt. Once you have worn this monitor for the period of time provider prescribed (Usually 14 days), you will return the monitor device in the postage paid box. Once it is returned they will download the data collected and provide [place] with a report which the provider will then review and we will call you with those results. Important tips:  Avoid showering during the first 24 hours of wearing the monitor. Avoid excessive sweating to help maximize wear time. Do not submerge the device, no hot tubs, and no swimming pools. Keep any lotions or oils away from the patch. After 24 hours you may shower with the patch on. Take brief showers with your back facing the shower head.  Do  not remove patch once it has been placed because that will interrupt data and decrease adhesive wear time. Push the button when you have any symptoms and write down what you were feeling. Once you have completed wearing your monitor, remove and place into box which has postage paid and place in your outgoing mailbox.  If for some reason you have misplaced your box then call our office and we can provide another box and/or mail it off for you.   Follow-Up: At [facility], you and your health needs are our priority.  As part of our continuing mission to provide you with exceptional heart care, we have created designated Provider Care Teams.  These Care Teams include your primary Cardiologist (physician) and Advanced Practice Providers (APPs -  Physician Assistants and Nurse Practitioners) who all work together to provide you with the care you need, when you need it.  We recommend signing up for the patient portal called "MyChart".  Sign up information is provided on this After Visit Summary.  MyChart is used to connect with patients for Virtual Visits (Telemedicine).  Patients are able to view lab/test results, encounter notes, upcoming appointments, etc.  Non-urgent messages can be sent to your provider as well.   To learn more about what you can do with MyChart, go to [email or web address].    Your next appointment:   6 week(s)  The format for your next appointment:   In Person  Provider:   You may see Dr. Harrell Gave End or one of the following Advanced Practice Providers  on your designated Care Team:   Murray Hodgkins, NP Christell Faith, PA-C Cadence Kathlen Mody, [place]  Important Information About Sugar

## 2022-02-09 DIAGNOSIS — K580 Irritable bowel syndrome with diarrhea: Secondary | ICD-10-CM | POA: Diagnosis not present

## 2022-02-09 DIAGNOSIS — R140 Abdominal distension (gaseous): Secondary | ICD-10-CM | POA: Diagnosis not present

## 2022-02-09 DIAGNOSIS — K6389 Other specified diseases of intestine: Secondary | ICD-10-CM | POA: Diagnosis not present

## 2022-02-09 NOTE — Addendum Note (Signed)
Addended by: Nestor Ramp on: 02/09/2022 03:04 PM   Modules accepted: Orders

## 2022-02-10 DIAGNOSIS — R002 Palpitations: Secondary | ICD-10-CM | POA: Diagnosis not present

## 2022-02-13 ENCOUNTER — Ambulatory Visit (INDEPENDENT_AMBULATORY_CARE_PROVIDER_SITE_OTHER): Payer: PPO

## 2022-02-13 VITALS — BP 126/72 | HR 67 | Ht 60.0 in | Wt 137.0 lb

## 2022-02-13 DIAGNOSIS — Z0000 Encounter for general adult medical examination without abnormal findings: Secondary | ICD-10-CM | POA: Diagnosis not present

## 2022-02-13 NOTE — Progress Notes (Signed)
Subjective:   JERIAH SKUFCA is a 76 y.o. female who presents for Medicare Annual (Subsequent) preventive examination.  Review of Systems    No ROS.  Medicare Wellness Virtual Visit.  Visual/audio telehealth visit, UTA vital signs.   See social history for additional risk factors.   Cardiac Risk Factors include: advanced age (>40mn, >>80women);hypertension     Objective:    Today's Vitals   02/13/22 1529  BP: 126/72  Pulse: 67  Weight: 137 lb (62.1 kg)  Height: 5' (1.524 m)   Body mass index is 26.76 kg/m.     02/13/2022    3:35 PM 11/28/2021   10:46 AM 09/19/2021   11:06 AM 02/10/2021    7:51 AM 02/07/2021   12:44 PM 05/24/2020    1:08 PM 01/16/2020    2:04 PM  Advanced Directives  Does Patient Have a Medical Advance Directive? Yes Yes Yes Yes Yes Yes Yes  Type of AParamedicof ASheldonLiving will Living will;Healthcare Power of [place] will [place]Living will H[place]Living will HNelsonLiving will  Does patient want to make changes to medical advance directive? No - Patient declined    No - Patient declined  No - Patient declined  Copy of HCeresin Chart? Yes - validated most recent copy scanned in chart (See row information) No - copy requested  Yes - validated most recent copy scanned in chart (See row information) Yes - validated most recent copy scanned in chart (See row information) Yes - validated most recent copy scanned in chart (See row information) Yes - validated most recent copy scanned in chart (See row information)    Current Medications (verified) Outpatient Encounter Medications as of 02/13/2022  Medication Sig   cholestyramine light (PREVALITE) 4 g packet Take 4 g by mouth 2 (two) times daily.   clobetasol (OLUX) 0.05 % topical foam Apply topically 2 (two) times daily.   clobetasol ointment (TEMOVATE) 03.47%  Apply 1 application topically 2 (two) times daily as needed. For rash. Avoid face.   Cyanocobalamin (VITAMIN B 12 PO) Take by mouth daily.   ketoconazole (NIZORAL) 2 % cream Apply 1 application. topically daily.   leflunomide (ARAVA) 10 MG tablet Take 1 tablet by mouth daily.   magnesium oxide (MAG-OX) 400 MG tablet Take 1 tablet by mouth daily.   meclizine (ANTIVERT) 25 MG tablet Take 25 mg by mouth 2 (two) times daily.   metoprolol tartrate (LOPRESSOR) 100 MG tablet Take 1 tablet (100 mg total) by mouth 2 (two) times daily.   Multiple Vitamins-Minerals (PRESERVISION AREDS 2 PO) Take by mouth daily.   mupirocin ointment (BACTROBAN) 2 % Place 1 application into the nose 2 (two) times daily.   Omega-3 Fatty Acids (OMEGA 3 500 PO) Take 1 capsule by mouth daily.    pantoprazole (PROTONIX) 40 MG tablet Take 1 tablet (40 mg total) by mouth daily.   potassium chloride (K-DUR) 10 MEQ tablet Take 10 mEq by mouth daily.   PREMARIN vaginal cream INSERT ONE APPLICATORFUL VAGINALLY TWICE WEEKLY   sertraline (ZOLOFT) 100 MG tablet Take 1 tablet (100 mg total) by mouth daily.   triamcinolone ointment (KENALOG) 0.1 % Apply topically 2 (two) times daily.   valACYclovir (VALTREX) 500 MG tablet Take 1 tablet (500 mg total) by mouth 2 (two) times daily. (Patient not taking: Reported on 02/08/2022)   No facility-administered encounter medications on file  as of 02/13/2022.    Allergies (verified) Gluten meal   History: Past Medical History:  Diagnosis Date   Abdominal pain    Adjustment disorder with anxiety    Anemia    Anxiety    adjustment disorder with anxiety   Arthritis    Atrophic vaginitis    BPPV (benign paroxysmal positional vertigo)    CAD in native artery    Cataract    Chronic diarrhea    Chronic pain of right knee    Coronary artery disease    Dysphagia    Dysphasia    Dysuria    Family history of colon cancer    Gastric ulcer 1980's   Gastric ulcer    Gastroesophageal hernia     GERD (gastroesophageal reflux disease)    High cholesterol    Hyperlipidemia    Hypertension    Macular degeneration    Osteoporosis    Osteoporosis    post-menopausal   Pedal edema    Psoriasis    Schatzki's ring 05/02/2013   Schatzki's ring    Scoliosis    Scoliosis    UTI (urinary tract infection)    Past Surgical History:  Procedure Laterality Date   ABDOMINAL HYSTERECTOMY     CARDIAC CATHETERIZATION     CHOLECYSTECTOMY     COLONOSCOPY     COLONOSCOPY N/A 11/12/2014   Procedure: COLONOSCOPY;  Surgeon: Manya Silvas, MD;  Location: [facility] ENDOSCOPY;  Service: Endoscopy;  Laterality: N/A;   COLONOSCOPY N/A 05/24/2020   Procedure: COLONOSCOPY;  Surgeon: Toledo, Benay Pike, MD;  Location: [facility] ENDOSCOPY;  Service: Endoscopy;  Laterality: N/A;   DIAGNOSTIC LAPAROSCOPY     paraesphogeal hernia   ESOPHAGOGASTRODUODENOSCOPY     ESOPHAGOGASTRODUODENOSCOPY (EGD) WITH PROPOFOL N/A 04/16/2017   Procedure: ESOPHAGOGASTRODUODENOSCOPY (EGD) WITH PROPOFOL;  Surgeon: Manya Silvas, MD;  Location: [facility] ENDOSCOPY;  Service: Endoscopy;  Laterality: N/A;   ESOPHAGOGASTRODUODENOSCOPY (EGD) WITH PROPOFOL N/A 02/10/2021   Procedure: ESOPHAGOGASTRODUODENOSCOPY (EGD) WITH PROPOFOL;  Surgeon: [name], MD;  Location: [facility] ENDOSCOPY;  Service: Gastroenterology;  Laterality: N/A;   ESOPHAGOGASTRODUODENOSCOPY (EGD) WITH PROPOFOL N/A 11/28/2021   Procedure: ESOPHAGOGASTRODUODENOSCOPY (EGD) WITH PROPOFOL;  Surgeon: Lesly Rubenstein, MD;  Location: [facility] ENDOSCOPY;  Service: Endoscopy;  Laterality: N/A;   GIVENS CAPSULE STUDY N/A 03/08/2021   Procedure: GIVENS CAPSULE STUDY;  Surgeon: [name], MD;  Location: [facility] ENDOSCOPY;  Service: Gastroenterology;  Laterality: N/A;   HERNIA REPAIR     inguinal hernia   HERNIA REPAIR     umbilical hernia   Family History  Problem Relation Age of Onset   Diabetes Mother    Parkinson's disease Mother    Stroke Mother    Heart disease Mother    Cancer Father         Pancreatic   Cancer Sister        Lung   Diabetes Sister    Diabetes Sister    Alzheimer's disease Sister    Tics Sister    Stroke Brother    Restless legs syndrome Brother    Diverticulosis Daughter    Alzheimer's disease Paternal Aunt        x2 sisters   Breast cancer Neg Hx    Social History   Socioeconomic History   Marital status: Single    Spouse name: Not on file   Number of children: Not on file   Years of education: Not on file   Highest education level: Not on file  Occupational History  Not on file  Tobacco Use   Smoking status: Never   Smokeless tobacco: Never  Vaping Use   Vaping Use: Never used  Substance and Sexual Activity   Alcohol use: No    Alcohol/week: 0.0 standard drinks of alcohol   Drug use: No   Sexual activity: Never  Other Topics Concern   Not on file  Social History Narrative   Divorced   Retired as a Scientist, research (medical)   12 + years of school    Social Determinants of Health   Financial Resource Strain: [place]  (02/07/2021)   Overall Financial Resource Strain (CARDIA)    Difficulty of Paying Living Expenses: Not hard at all  Food Insecurity: No [place] (02/07/2021)   Hunger Vital Sign    Worried About Running Out of Food in the Last Year: Never true    [place] in the Last Year: Never true  Transportation Needs: No Transportation Needs (02/07/2021)   PRAPARE - Hydrologist (Medical): No    Lack of Transportation (Non-Medical): No  Physical Activity: Not on file  Stress: No Stress Concern Present (02/07/2021)   [place]    Feeling of Stress : Not at all  Social Connections: Unknown (02/07/2021)   Social Connection and Isolation Panel [NHANES]    Frequency of Communication with Friends and Family: More than three times a week    Frequency of Social Gatherings with Friends and Family: More than three times a week    Attends  Religious Services: Not on Advertising copywriter or Organizations: Not on file    Attends Archivist Meetings: Not on file    Marital Status: Not on file    Tobacco Counseling Counseling given: Not Answered   Clinical Intake: Pre-visit preparation completed: Yes                     Activities of Daily Living    02/13/2022    3:35 PM  In your present state of health, do you have any difficulty performing the following activities:  Hearing? 0  Vision? 0  Difficulty concentrating or making decisions? 0  Walking or climbing stairs? 1  Comment Leg weakness. Paces self with activity.  Dressing or bathing? 0  Doing errands, shopping? 0  Preparing Food and eating ? N  Using the Toilet? N  In the past six months, have you accidently leaked urine? N  Do you have problems with loss of bowel control? Y  Comment Managed with daily brief. Followed by GI. Chronic diarrhea.  Managing your Medications? N  Managing your Finances? N  Housekeeping or managing your Housekeeping? N   Patient Care Team: Leone Haven, MD as PCP - General (Family Medicine) End, Harrell Gave, MD as PCP - Cardiology (Cardiology) Sindy Guadeloupe, MD as Consulting Physician (Hematology and Oncology)  Indicate any recent Medical Services you may have received from other than [facility] providers in the past year (date may be approximate).     Assessment:   This is a routine wellness examination for Melek.  Virtual Visit via Telephone Note  I connected with  Bethann Berkshire on 02/13/22 at  3:15 PM EDT by telephone and verified that I am speaking with the correct person using two identifiers.  Location: Patient: home Provider: office Persons participating in the virtual visit: patient/Nurse Health Advisor   I discussed  the limitations of performing an evaluation and management service by telehealth. We continued and completed visit with audio only. Some vital signs may be absent or  patient reported.   Hearing/Vision screen Hearing Screening - Comments:: Patient has difficulty hearing conversational tones in a large crowd. She chooses not to wear hearing aids.  Followed by ENT Vision Screening - Comments:: Followed by [facility]  Wears corrective lenses  Macular degeneration, dry They have regular follow up with the ophthalmologist  Dietary issues and exercise activities discussed: Current Exercise Habits: Home exercise routine, Type of exercise: stretching (Chair/standing exercises), Time (Minutes): 60, Frequency (Times/Week): 4, Weekly Exercise (Minutes/Week): 240, Intensity: Mild   Goals Addressed               This Visit's Progress     Patient Stated     Increase physical activity (pt-stated)        Add stepper to exercise routine       Depression Screen    02/13/2022    3:35 PM 12/20/2021   10:16 AM 06/21/2021   11:17 AM 02/07/2021   12:42 PM 06/21/2020   10:41 AM 01/16/2020    2:08 PM 12/17/2019   10:31 AM  PHQ 2/9 Scores  PHQ - 2 Score 0 0 0 0 0 1 0    Fall Risk    02/13/2022    3:35 PM 12/20/2021   10:16 AM 06/21/2021   11:17 AM 02/07/2021   12:47 PM 12/20/2020   11:05 AM  [place] in the past year? 0 0 0 0 0  Number falls in past yr: 0 0 0 0 0  Injury with Fall?  0 0  0  Risk for fall due to :  No Fall Risks No Fall Risks    Follow up Falls evaluation completed Falls evaluation completed Falls evaluation completed Falls evaluation completed Falls evaluation completed    [place]: Home free of loose throw rugs in walkways, pet beds, electrical cords, etc? Yes  Adequate lighting in your home to reduce risk of falls? Yes   ASSISTIVE DEVICES UTILIZED TO PREVENT FALLS: Life alert? No  Use of a cane, walker or w/c? Yes , cane as needed. Grab bars in the bathroom? Yes  Shower chair or bench in shower? Yes  Elevated toilet seat or a handicapped toilet? Yes   TIMED UP AND GO: Was the test  performed? No .   Cognitive Function: Patient is alert and oriented x3.      09/29/2016   10:34 AM 09/30/2015   10:43 AM  MMSE - Mini Mental State Exam  Orientation to time 5 5  Orientation to Place 5 5  Registration 3 3  Attention/ Calculation 5 5  Recall 3 3  Language- name 2 objects 2 2  Language- repeat 1 1  Language- follow 3 step command 3 3  Language- read & follow direction 1 1  Write a sentence 1 1  Copy design 1 1  Total score 30 30        02/13/2022    3:44 PM 02/07/2021   12:53 PM 01/16/2020    2:25 PM 01/15/2019   10:24 AM 10/02/2017   10:58 AM  6CIT Screen  What Year? 0 points 0 points 0 points 0 points 0 points  What month? 0 points 0 points 0 points 0 points 0 points  What time? 0 points 0 points 0 points 0 points 0  points  Count back from 20 0 points 0 points  0 points 0 points  Months in reverse 0 points 0 points 0 points 0 points 0 points  Repeat phrase 0 points 0 points 0 points 0 points 0 points  Total Score 0 points 0 points  0 points 0 points    Immunizations Immunization History  Administered Date(s) Administered   Fluad Quad(high Dose 65+) 03/31/2019   Influenza, High Dose Seasonal PF 03/31/2016, 04/06/2017, 03/27/2018, 03/29/2018, 03/30/2020, 04/01/2021   Influenza-Unspecified 04/14/2015   PFIZER Comirnaty(Gray Top)Covid-19 Tri-Sucrose Vaccine 10/12/2020   PFIZER(Purple Top)SARS-COV-2 Vaccination 07/28/2019, 08/18/2019   Pfizer Covid-19 Vaccine Bivalent Booster 47yr & up 04/06/2021   Pneumococcal Conjugate-13 09/30/2015   Pneumococcal Polysaccharide-23 06/15/2010   Pneumococcal-Unspecified 07/17/2011   Tdap 07/21/2015   Zoster Recombinat (Shingrix) 07/26/2021, 11/08/2021   Zoster, Live 08/17/2011   Screening Tests Health Maintenance  Topic Date Due   INFLUENZA VACCINE  02/07/2022   TETANUS/TDAP  07/20/2025   Pneumonia Vaccine 76 Years old  Completed   DEXA SCAN  Completed   COVID-19 Vaccine  Completed   Hepatitis C Screening  Completed    Zoster Vaccines- Shingrix  Completed   HPV VACCINES  Aged Out   COLONOSCOPY (Pts 45-431yrInsurance coverage will need to be confirmed)  Discontinued   Health Maintenance Health Maintenance Due  Topic Date Due   INFLUENZA VACCINE  02/07/2022   Lung Cancer Screening: (Low Dose CT Chest recommended if Age 78-80ears, 30 pack-year currently smoking OR have quit w/in 15years.) does not qualify.   Vision Screening: Recommended annual ophthalmology exams for early detection of glaucoma and other disorders of the eye.  Dental Screening: Recommended annual dental exams for proper oral hygiene  Community Resource Referral / Chronic Care Management: CRR required this visit?  No   CCM required this visit?  No      Plan:   Keep all routine maintenance appointments.   I have personally reviewed and noted the following in the patient's chart:   Medical and social history Use of alcohol, tobacco or illicit drugs  Current medications and supplements including opioid prescriptions.  Functional ability and status Nutritional status Physical activity Advanced directives List of other physicians Hospitalizations, surgeries, and ER visits in previous 12 months Vitals Screenings to include cognitive, depression, and falls Referrals and appointments  In addition, I have reviewed and discussed with patient certain preventive protocols, quality metrics, and best practice recommendations. A written personalized care plan for preventive services as well as general preventive health recommendations were provided to patient.     OBVarney BilesLPN   8/09/15/4663

## 2022-02-13 NOTE — Patient Instructions (Addendum)
  Lindsay Tran , Thank you for taking time to come for your Medicare Wellness Visit. I appreciate your ongoing commitment to your health goals. Please review the following plan we discussed and let me know if I can assist you in the future.   These are the goals we discussed:  Goals       Patient Stated     Increase physical activity (pt-stated)      Add stepper to exercise routine        This is a list of the screening recommended for you and due dates:  Health Maintenance  Topic Date Due   Flu Shot  02/07/2022   Tetanus Vaccine  07/20/2025   Pneumonia Vaccine  Completed   DEXA scan (bone density measurement)  Completed   COVID-19 Vaccine  Completed   Hepatitis C Screening: USPSTF Recommendation to screen - Ages 59-79 yo.  Completed   Zoster (Shingles) Vaccine  Completed   HPV Vaccine  Aged Out   Colon Cancer Screening  Discontinued

## 2022-02-16 DIAGNOSIS — D509 Iron deficiency anemia, unspecified: Secondary | ICD-10-CM | POA: Diagnosis not present

## 2022-02-16 DIAGNOSIS — D84821 Immunodeficiency due to drugs: Secondary | ICD-10-CM | POA: Diagnosis not present

## 2022-02-16 DIAGNOSIS — R011 Cardiac murmur, unspecified: Secondary | ICD-10-CM | POA: Diagnosis not present

## 2022-02-16 DIAGNOSIS — M069 Rheumatoid arthritis, unspecified: Secondary | ICD-10-CM | POA: Diagnosis not present

## 2022-02-16 DIAGNOSIS — L4050 Arthropathic psoriasis, unspecified: Secondary | ICD-10-CM | POA: Diagnosis not present

## 2022-02-16 DIAGNOSIS — F324 Major depressive disorder, single episode, in partial remission: Secondary | ICD-10-CM | POA: Diagnosis not present

## 2022-02-16 DIAGNOSIS — M810 Age-related osteoporosis without current pathological fracture: Secondary | ICD-10-CM | POA: Diagnosis not present

## 2022-02-16 DIAGNOSIS — K219 Gastro-esophageal reflux disease without esophagitis: Secondary | ICD-10-CM | POA: Diagnosis not present

## 2022-02-16 DIAGNOSIS — I10 Essential (primary) hypertension: Secondary | ICD-10-CM | POA: Diagnosis not present

## 2022-02-16 DIAGNOSIS — H259 Unspecified age-related cataract: Secondary | ICD-10-CM | POA: Diagnosis not present

## 2022-02-16 DIAGNOSIS — G8929 Other chronic pain: Secondary | ICD-10-CM | POA: Diagnosis not present

## 2022-02-16 DIAGNOSIS — E663 Overweight: Secondary | ICD-10-CM | POA: Diagnosis not present

## 2022-03-01 DIAGNOSIS — R1310 Dysphagia, unspecified: Secondary | ICD-10-CM | POA: Diagnosis not present

## 2022-03-01 DIAGNOSIS — R42 Dizziness and giddiness: Secondary | ICD-10-CM | POA: Diagnosis not present

## 2022-03-01 DIAGNOSIS — R262 Difficulty in walking, not elsewhere classified: Secondary | ICD-10-CM | POA: Diagnosis not present

## 2022-03-01 DIAGNOSIS — R251 Tremor, unspecified: Secondary | ICD-10-CM | POA: Diagnosis not present

## 2022-03-01 DIAGNOSIS — R29818 Other symptoms and signs involving the nervous system: Secondary | ICD-10-CM | POA: Diagnosis not present

## 2022-03-01 DIAGNOSIS — H9319 Tinnitus, unspecified ear: Secondary | ICD-10-CM | POA: Diagnosis not present

## 2022-03-02 DIAGNOSIS — R002 Palpitations: Secondary | ICD-10-CM | POA: Diagnosis not present

## 2022-03-02 DIAGNOSIS — L4050 Arthropathic psoriasis, unspecified: Secondary | ICD-10-CM | POA: Diagnosis not present

## 2022-03-02 DIAGNOSIS — M170 Bilateral primary osteoarthritis of knee: Secondary | ICD-10-CM | POA: Diagnosis not present

## 2022-03-02 DIAGNOSIS — Z7960 Long term (current) use of unspecified immunomodulators and immunosuppressants: Secondary | ICD-10-CM | POA: Diagnosis not present

## 2022-03-02 DIAGNOSIS — L409 Psoriasis, unspecified: Secondary | ICD-10-CM | POA: Diagnosis not present

## 2022-03-24 ENCOUNTER — Inpatient Hospital Stay: Payer: PPO | Attending: Oncology

## 2022-03-24 ENCOUNTER — Encounter: Payer: Self-pay | Admitting: Oncology

## 2022-03-24 ENCOUNTER — Ambulatory Visit: Payer: PPO | Admitting: Cardiology

## 2022-03-24 ENCOUNTER — Inpatient Hospital Stay: Payer: PPO

## 2022-03-24 ENCOUNTER — Inpatient Hospital Stay: Payer: PPO | Admitting: Oncology

## 2022-03-24 VITALS — BP 162/85 | HR 62 | Temp 96.4°F | Resp 16 | Wt 138.0 lb

## 2022-03-24 DIAGNOSIS — Z90710 Acquired absence of both cervix and uterus: Secondary | ICD-10-CM | POA: Diagnosis not present

## 2022-03-24 DIAGNOSIS — I10 Essential (primary) hypertension: Secondary | ICD-10-CM | POA: Diagnosis not present

## 2022-03-24 DIAGNOSIS — D509 Iron deficiency anemia, unspecified: Secondary | ICD-10-CM

## 2022-03-24 DIAGNOSIS — I471 Supraventricular tachycardia: Secondary | ICD-10-CM | POA: Diagnosis not present

## 2022-03-24 DIAGNOSIS — Z800 Family history of malignant neoplasm of digestive organs: Secondary | ICD-10-CM | POA: Diagnosis not present

## 2022-03-24 DIAGNOSIS — Z801 Family history of malignant neoplasm of trachea, bronchus and lung: Secondary | ICD-10-CM | POA: Diagnosis not present

## 2022-03-24 DIAGNOSIS — E611 Iron deficiency: Secondary | ICD-10-CM

## 2022-03-24 LAB — IRON AND TIBC
Iron: 127 ug/dL (ref 28–170)
Saturation Ratios: 45 % — ABNORMAL HIGH (ref 10.4–31.8)
TIBC: 286 ug/dL (ref 250–450)
UIBC: 159 ug/dL

## 2022-03-24 LAB — CBC WITH DIFFERENTIAL/PLATELET
Abs Immature Granulocytes: 0.01 10*3/uL (ref 0.00–0.07)
Basophils Absolute: 0 10*3/uL (ref 0.0–0.1)
Basophils Relative: 1 %
Eosinophils Absolute: 0.2 10*3/uL (ref 0.0–0.5)
Eosinophils Relative: 4 %
HCT: 43 % (ref 36.0–46.0)
Hemoglobin: 13.7 g/dL (ref 12.0–15.0)
Immature Granulocytes: 0 %
Lymphocytes Relative: 25 %
Lymphs Abs: 1.1 10*3/uL (ref 0.7–4.0)
MCH: 29.7 pg (ref 26.0–34.0)
MCHC: 31.9 g/dL (ref 30.0–36.0)
MCV: 93.3 fL (ref 80.0–100.0)
Monocytes Absolute: 0.4 10*3/uL (ref 0.1–1.0)
Monocytes Relative: 8 %
Neutro Abs: 2.6 10*3/uL (ref 1.7–7.7)
Neutrophils Relative %: 62 %
Platelets: 223 10*3/uL (ref 150–400)
RBC: 4.61 MIL/uL (ref 3.87–5.11)
RDW: 12.9 % (ref 11.5–15.5)
WBC: 4.3 10*3/uL (ref 4.0–10.5)
nRBC: 0 % (ref 0.0–0.2)

## 2022-03-24 LAB — FERRITIN: Ferritin: 230 ng/mL (ref 11–307)

## 2022-03-27 ENCOUNTER — Encounter: Payer: Self-pay | Admitting: Family Medicine

## 2022-03-27 ENCOUNTER — Ambulatory Visit (INDEPENDENT_AMBULATORY_CARE_PROVIDER_SITE_OTHER): Payer: PPO | Admitting: Family Medicine

## 2022-03-27 VITALS — BP 120/70 | HR 60 | Temp 97.6°F | Ht 60.0 in | Wt 138.4 lb

## 2022-03-27 DIAGNOSIS — G250 Essential tremor: Secondary | ICD-10-CM

## 2022-03-27 DIAGNOSIS — Z23 Encounter for immunization: Secondary | ICD-10-CM | POA: Diagnosis not present

## 2022-03-27 DIAGNOSIS — F419 Anxiety disorder, unspecified: Secondary | ICD-10-CM

## 2022-03-27 DIAGNOSIS — R3915 Urgency of urination: Secondary | ICD-10-CM | POA: Diagnosis not present

## 2022-03-27 DIAGNOSIS — R0789 Other chest pain: Secondary | ICD-10-CM | POA: Diagnosis not present

## 2022-03-27 DIAGNOSIS — F32A Depression, unspecified: Secondary | ICD-10-CM

## 2022-03-27 LAB — POCT URINALYSIS DIPSTICK
Bilirubin, UA: NEGATIVE
Blood, UA: NEGATIVE
Glucose, UA: NEGATIVE
Ketones, UA: NEGATIVE
Nitrite, UA: NEGATIVE
Protein, UA: NEGATIVE
Spec Grav, UA: 1.01 (ref 1.010–1.025)
Urobilinogen, UA: 0.2 E.U./dL
pH, UA: 6 (ref 5.0–8.0)

## 2022-03-27 MED ORDER — PANTOPRAZOLE SODIUM 40 MG PO TBEC: 40.0000 mg | DELAYED_RELEASE_TABLET | Freq: Every day | ORAL | 3 refills | Status: DC

## 2022-03-27 NOTE — Progress Notes (Signed)
Cardiology Clinic Note   Patient Name: Lindsay Tran Date of Encounter: 03/31/2022  Primary Care Provider:  Leone Haven, MD Primary Cardiologist:  Nelva Bush, MD  Patient Profile    76 year old female with a past medical history of coronary artery calcification, hypertension, hyperlipidemia, varicose veins, hiatal hernia, peptic ulcer disease, iron deficiency anemia, essential tremor, scoliosis, and Psoriatec arthritis, who is here today to follow-up on recent episodes of chest pain and shortness of breath.  Past Medical History    Past Medical History:  Diagnosis Date   Abdominal pain    Adjustment disorder with anxiety    Anemia    Anxiety    adjustment disorder with anxiety   Arthritis    Atrophic vaginitis    BPPV (benign paroxysmal positional vertigo)    CAD in native artery    Cataract    Chronic diarrhea    Chronic pain of right knee    Coronary artery disease    Dysphagia    Dysphasia    Dysuria    Family history of colon cancer    Gastric ulcer 1980's   Gastric ulcer    Gastroesophageal hernia    GERD (gastroesophageal reflux disease)    High cholesterol    Hyperlipidemia    Hypertension    Macular degeneration    Osteoporosis    Osteoporosis    post-menopausal   Pedal edema    Psoriasis    Schatzki's ring 05/02/2013   Schatzki's ring    Scoliosis    Scoliosis    UTI (urinary tract infection)    Past Surgical History:  Procedure Laterality Date   ABDOMINAL HYSTERECTOMY     CARDIAC CATHETERIZATION     CHOLECYSTECTOMY     COLONOSCOPY     COLONOSCOPY N/A 11/12/2014   Procedure: COLONOSCOPY;  Surgeon: Manya Silvas, MD;  Location: [facility] ENDOSCOPY;  Service: Endoscopy;  Laterality: N/A;   COLONOSCOPY N/A 05/24/2020   Procedure: COLONOSCOPY;  Surgeon: Toledo, Benay Pike, MD;  Location: [facility] ENDOSCOPY;  Service: Endoscopy;  Laterality: N/A;   DIAGNOSTIC LAPAROSCOPY     paraesphogeal hernia   ESOPHAGOGASTRODUODENOSCOPY      ESOPHAGOGASTRODUODENOSCOPY (EGD) WITH PROPOFOL N/A 04/16/2017   Procedure: ESOPHAGOGASTRODUODENOSCOPY (EGD) WITH PROPOFOL;  Surgeon: Manya Silvas, MD;  Location: [facility] ENDOSCOPY;  Service: Endoscopy;  Laterality: N/A;   ESOPHAGOGASTRODUODENOSCOPY (EGD) WITH PROPOFOL N/A 02/10/2021   Procedure: ESOPHAGOGASTRODUODENOSCOPY (EGD) WITH PROPOFOL;  Surgeon: [name], MD;  Location: [facility] ENDOSCOPY;  Service: Gastroenterology;  Laterality: N/A;   ESOPHAGOGASTRODUODENOSCOPY (EGD) WITH PROPOFOL N/A 11/28/2021   Procedure: ESOPHAGOGASTRODUODENOSCOPY (EGD) WITH PROPOFOL;  Surgeon: Lesly Rubenstein, MD;  Location: [facility] ENDOSCOPY;  Service: Endoscopy;  Laterality: N/A;   GIVENS CAPSULE STUDY N/A 03/08/2021   Procedure: GIVENS CAPSULE STUDY;  Surgeon: [name], MD;  Location: [facility] ENDOSCOPY;  Service: Gastroenterology;  Laterality: N/A;   HERNIA REPAIR     inguinal hernia   HERNIA REPAIR     umbilical hernia    Allergies  Allergies  Allergen Reactions   Gluten Meal Rash    History of Present Illness    76 year old female with previously mentioned past medical history of coronary artery calcification, hypertension, hyperlipidemia, varicose veins, hiatal hernia, peptic ulcer disease, iron deficiency anemia, essential tremor, scoliosis, and psoriatic arthritis.  Ms. hair reported that she had undergone extensive testing approximately 10 years ago with Dr. Humphrey Rolls.  She was told that she had coronary artery calcification and a leaking heart valve.  Around the  same time she had been diagnosed with a hiatal hernia and underwent repair and experience resolution of any chest pain or shortness of breath that have been previously bothering her.  She did continue to follow closely by GI and was recently told that she was having esophageal spasms.  She was advised at that time by GI that she may benefit from calcium channel blocker therapy but wanted cardiology to weigh in as they were concerned about the  safety of using this with her history of cardiac problems.  She was last seen in clinic on 02/08/2022 by Dr. Saunders Revel with complaints of chest pain and shortness of breath as well as palpitations.  On physical exam she was noted to have a heart murmur and some leg swelling.  With her complaints of chest discomfort and shortness of breath she was scheduled for an echocardiogram.  Her echo revealed LVEF 55-60%, G2 DD, left atrial size is mildly dilated, mild mitral regurgitation was also noted.  For her palpitations she was subsequently placed on a long-term monitor predominant rhythm was sinus with average heart rate of 65, she did have 37 supraventricular runs that were observed lasting up to 19 beats, so she had several episodes of brief PSVT as well as rare PACs and PVCs.  She returns clinic today stating that she has been doing better since her last visit here. She has been started on a few new medications that have improved her tremors significantly. She continues to have esophageal spasms. She also has complaints of intermittent claudication with cramps and pain to her calves upon ambulation. She states that it does improve with rest. Her symptoms of chest pain and shortness of breath have decreased. There have been no hospitalizations or visits to the emergency department.  Home Medications    Current Outpatient Medications  Medication Sig Dispense Refill   amLODipine (NORVASC) 2.5 MG tablet Take 1 tablet (2.5 mg total) by mouth daily. 90 tablet 3   busPIRone (BUSPAR) 7.5 MG tablet Take 7.5 mg at night for one week, then increase to 15 mg at night.     clobetasol (OLUX) 0.05 % topical foam Apply topically 2 (two) times daily. 50 g 0   clobetasol ointment (TEMOVATE) 8.12 % Apply 1 application topically 2 (two) times daily as needed. For rash. Avoid face. 180 g 1   Cyanocobalamin (VITAMIN B 12 PO) Take by mouth daily.     gabapentin (NEURONTIN) 100 MG capsule Take 100 mg twice daily for one week, then  increase to 200 mg twice daily for tremors.     ketoconazole (NIZORAL) 2 % cream Apply 1 application. topically daily.     magnesium oxide (MAG-OX) 400 MG tablet Take 1 tablet by mouth daily.     Multiple Vitamins-Minerals (PRESERVISION AREDS 2 PO) Take by mouth daily.     mupirocin ointment (BACTROBAN) 2 % Place 1 application into the nose 2 (two) times daily. 22 g 1   Omega-3 Fatty Acids (OMEGA 3 500 PO) Take 1 capsule by mouth daily.      pantoprazole (PROTONIX) 40 MG tablet Take 1 tablet (40 mg total) by mouth daily. 90 tablet 3   potassium chloride (K-DUR) 10 MEQ tablet Take 10 mEq by mouth daily.     triamcinolone ointment (KENALOG) 0.1 % Apply topically 2 (two) times daily. 240 g 0   valACYclovir (VALTREX) 500 MG tablet Take 1 tablet (500 mg total) by mouth 2 (two) times daily. 60 tablet 2   metoprolol tartrate (  LOPRESSOR) 100 MG tablet Take '100mg'$  (1 tablet)  in the morning and '50mg'$  (0.5 tablet)  in the evening. 135 tablet 3   No current facility-administered medications for this visit.     Family History    Family History  Problem Relation Age of Onset   Diabetes Mother    Parkinson's disease Mother    Stroke Mother    Heart disease Mother    Cancer Father        Pancreatic   Cancer Sister        Lung   Diabetes Sister    Diabetes Sister    Alzheimer's disease Sister    Tics Sister    Stroke Brother    Restless legs syndrome Brother    Diverticulosis Daughter    Alzheimer's disease Paternal Aunt        x2 sisters   Breast cancer Neg Hx    She indicated that her mother is deceased. She indicated that her father is deceased. She indicated that only one of her four sisters is alive. She indicated that both of her brothers are alive. She indicated that her daughter is alive. She indicated that the status of her paternal aunt is unknown. She indicated that the status of her neg hx is unknown.  Social History    Social History   Socioeconomic History   Marital status:  Single    Spouse name: Not on file   Number of children: Not on file   Years of education: Not on file   Highest education level: Not on file  Occupational History   Not on file  Tobacco Use   Smoking status: Never   Smokeless tobacco: Never  Vaping Use   Vaping Use: Never used  Substance and Sexual Activity   Alcohol use: No    Alcohol/week: 0.0 standard drinks of alcohol   Drug use: No   Sexual activity: Never  Other Topics Concern   Not on file  Social History Narrative   Divorced   Retired as a Scientist, research (medical)   12 + years of school    Social Determinants of Health   Financial Resource Strain: [place]  (02/07/2021)   Overall Financial Resource Strain (CARDIA)    Difficulty of Paying Living Expenses: Not hard at [place]: No [place] (02/07/2021)   Hunger Vital Sign    Worried About Running Out of Food in the Last Year: Never true    [place] in the Last Year: Never true  Transportation Needs: No Transportation Needs (02/07/2021)   PRAPARE - Hydrologist (Medical): No    Lack of Transportation (Non-Medical): No  Physical Activity: Not on file  Stress: No Stress Concern Present (02/07/2021)   [place]    Feeling of Stress : Not at all  Social Connections: Unknown (02/07/2021)   Social Connection and Isolation Panel [NHANES]    Frequency of Communication with Friends and Family: More than three times a week    Frequency of Social Gatherings with Friends and Family: More than three times a week    Attends Religious Services: Not on file    Active Member of Clubs or Organizations: Not on file    Attends Archivist Meetings: Not on file    Marital Status: Not on file  Intimate Partner Violence: Not At Risk (02/07/2021)   Humiliation, Afraid, Rape, and Kick questionnaire  Fear of Current or Ex-Partner: No    Emotionally Abused: No    Physically  Abused: No    Sexually Abused: No     Review of Systems    General:  No chills, fever, night sweats or weight changes.  Cardiovascular:  No chest pain, dyspnea on exertion, edema, orthopnea, palpitations, paroxysmal nocturnal dyspnea. Dermatological: No rash, lesions/masses Respiratory: No cough, dyspnea Urologic: No hematuria, dysuria Abdominal:   No nausea, vomiting, diarrhea, bright red blood per rectum, melena, or hematemesis endorses esophogeal spasms Musculoskeletal: endorses intermittent claudication   Neurologic:  No visual changes, wkns, changes in mental status. All other systems reviewed and are otherwise negative except as noted above.   Physical Exam    VS:  BP 124/80 (BP Location: Left Arm, Patient Position: Sitting, Cuff Size: Normal)   Pulse (!) 58   Ht 5' (1.524 m)   Wt 139 lb (63 kg)   SpO2 97%   BMI 27.15 kg/m  , BMI Body mass index is 27.15 kg/m.     GEN: Well nourished, well developed, in no acute distress. HEENT: normal. Neck: Supple, no JVD, carotid bruits, or masses. Cardiac: RRR, I/VI systolic murmur,  without rubs, or gallops. No clubbing, cyanosis, trace pretibial edema.  Radials/DP/PT 2+ and equal bilaterally.  Compression stockings on bilaterally Respiratory:  Respirations regular and unlabored, clear to auscultation bilaterally. GI: Soft, nontender, nondistended, BS + x 4. MS: no deformity or atrophy. Skin: warm and dry, no rash. Neuro:  Strength and sensation are intact. Psych: Normal affect.  Accessory Clinical Findings    ECG personally reviewed by me today-sinus bradycardia with a rate of 58- No acute changes  Lab Results  Component Value Date   WBC 4.3 03/24/2022   HGB 13.7 03/24/2022   HCT 43.0 03/24/2022   MCV 93.3 03/24/2022   PLT 223 03/24/2022   Lab Results  Component Value Date   CREATININE 0.69 12/20/2021   BUN 21 12/20/2021   NA 135 12/20/2021   K 4.3 12/20/2021   CL 100 12/20/2021   CO2 25 12/20/2021   Lab Results   Component Value Date   ALT 14 08/04/2021   AST 20 08/04/2021   ALKPHOS 53 08/04/2021   BILITOT 0.6 08/04/2021   Lab Results  Component Value Date   CHOL 153 08/04/2021   HDL 60.80 08/04/2021   LDLCALC 66 08/04/2021   TRIG 131.0 08/04/2021   CHOLHDL 3 08/04/2021    Lab Results  Component Value Date   HGBA1C 5.4 07/22/2020    Assessment & Plan   1.  Chest pain with associated shortness of breath that she states has improved.  She has been placed on new medications of BuSpar and gabapentin that have improved her tremors tremendously.  She states that she is still having esophageal spasms and had previously been advised that maybe she should be started on a calcium channel blocker to help with her spasms.  She has recently followed up with GI who is opted to leave any medication changes but to cardiology at this time.  Previously she had been advised if she was started on calcium channel blockers and would prefer to be placed on amlodipine versus diltiazem or verapamil to decrease episodes of bradycardia she would also need to be weaned off of metoprolol.   2.  Palpitations that she states has improved.  She did wear a long-term monitor which revealed sinus with an average of 65 there were some PACs and PVCs but no sustained  arrhythmia or prolonged pauses.  With previous changes in her medication and decreased amount of tremor the palpitations have improved.  With her continued to have esophageal spasms we will decrease the metoprolol to 100 mg a.m. 50 mg in p.m. was recently amlodipine 2.5 mg at bedtime.  3.  Hypertension with blood pressure today 120/70 on exam.  She has been continued on metoprolol decreased her dose to 100 mg a.m. and 50 mg p.m. and started on low-dose amlodipine 2.5 mg in the evening see if it would help with her esophageal spasms.  Without dropping her blood pressure too low since being started on BuSpar and gabapentin.  MS continue to monitor blood pressure at  home.  4.  Intermittent claudication with calf pain on ambulation.  She has previously had venous ablation procedure and repeat venous reflux studies completed at the vein center.  She still continues to complain of claudication symptoms to her bilateral calves will be scheduled for ABIs.  5.  Heart murmur present on  physical exam patient has recently undergone echocardiogram which revealed LVEF of 55-60%.  No wall motion abnormalities that were noted she does have an EGD to DD, left atrial size is moderately dilated, there is mild mitral valve regurgitation, with aortic sclerosis without any evidence of stenosis.  5. Disposition patient return to clinic to see MD/APP in 4 weeks or sooner if needed  Alleene Stoy, NP 03/31/2022, 4:39 PM

## 2022-03-27 NOTE — Progress Notes (Signed)
Lindsay Rumps, MD Phone: [phone]  Lindsay Tran is a 76 y.o. female who presents today for follow-up.  Anxiety/depression: Patient notes no anxiety or depression.  She had to stop Zoloft given urge incontinence issues.  She notes these issues resolved.  And Zoloft.  She is now on BuSpar prescribed by neurology.  She notes no SI.  Tremor: Patient notes she saw neurology for evaluation.  They started her on buspirone and gabapentin to help with this.  She notes she has not had any tremor since going on these medications.  Chest pain/shortness of breath: She saw cardiology and having an echo and a heart monitor.  Her echo revealed aortic sclerosis, moderate left atrial enlargement, mild mitral regurgitation, and grade 2 diastolic dysfunction.  Her heart monitor revealed a few extra beats which intermittently corresponded to her palpitations.  She follows up with cardiology later this week.  Urinary urgency: Patient notes last week her urine was dark in color.  She noted her side was hurting with this.  She noted some urinary urgency.  No blood in her urine.  No dysuria.  No urinary frequency.  She wonders if she has a UTI.  She notes most of her symptoms have improved at this time though she does still have a little bit of discomfort in her side which has been typical with prior UTIs per her report.  Social History   Tobacco Use  Smoking Status Never  Smokeless Tobacco Never    Current Outpatient Medications on File Prior to Visit  Medication Sig Dispense Refill   busPIRone (BUSPAR) 7.5 MG tablet Take 7.5 mg at night for one week, then increase to 15 mg at night.     clobetasol (OLUX) 0.05 % topical foam Apply topically 2 (two) times daily. 50 g 0   clobetasol ointment (TEMOVATE) 1.01 % Apply 1 application topically 2 (two) times daily as needed. For rash. Avoid face. 180 g 1   Cyanocobalamin (VITAMIN B 12 PO) Take by mouth daily.     gabapentin (NEURONTIN) 100 MG capsule Take 100  mg twice daily for one week, then increase to 200 mg twice daily for tremors.     ketoconazole (NIZORAL) 2 % cream Apply 1 application. topically daily.     magnesium oxide (MAG-OX) 400 MG tablet Take 1 tablet by mouth daily.     metoprolol tartrate (LOPRESSOR) 100 MG tablet Take 1 tablet (100 mg total) by mouth 2 (two) times daily. 180 tablet 2   Multiple Vitamins-Minerals (PRESERVISION AREDS 2 PO) Take by mouth daily.     mupirocin ointment (BACTROBAN) 2 % Place 1 application into the nose 2 (two) times daily. 22 g 1   Omega-3 Fatty Acids (OMEGA 3 500 PO) Take 1 capsule by mouth daily.      potassium chloride (K-DUR) 10 MEQ tablet Take 10 mEq by mouth daily.     triamcinolone ointment (KENALOG) 0.1 % Apply topically 2 (two) times daily. 240 g 0   valACYclovir (VALTREX) 500 MG tablet Take 1 tablet (500 mg total) by mouth 2 (two) times daily. 60 tablet 2   No current facility-administered medications on file prior to visit.     ROS see history of present illness  Objective  Physical Exam Vitals:   03/27/22 1208  BP: 120/70  Pulse: 60  Temp: 97.6 F (36.4 C)  SpO2: 96%    BP Readings from Last 3 Encounters:  03/27/22 120/70  03/24/22 (!) 162/85  02/13/22 126/72   Wt  Readings from Last 3 Encounters:  03/27/22 138 lb 6.4 oz (62.8 kg)  03/24/22 138 lb (62.6 kg)  02/13/22 137 lb (62.1 kg)    Physical Exam Constitutional:      General: She is not in acute distress.    Appearance: She is not diaphoretic.  Cardiovascular:     Rate and Rhythm: Normal rate and regular rhythm.     Heart sounds: Normal heart sounds.  Pulmonary:     Effort: Pulmonary effort is normal.     Breath sounds: Normal breath sounds.  Abdominal:     General: There is no distension.     Tenderness: There is no abdominal tenderness.  Skin:    General: Skin is warm and dry.  Neurological:     Mental Status: She is alert.      Assessment/Plan: Please see individual problem list.  Problem List  Items Addressed This Visit     Anxiety and depression (Chronic)    Asymptomatic.  She will remain on buspirone 15 mg daily.      Essential tremor (Chronic)    Improved.  She will continue buspirone 15 mg nightly and gabapentin 200 mg twice daily.  She will continue to see neurology.      Atypical chest pain    Cardiology felt as though her symptoms may be GI related.  She will follow-up with cardiology as planned.  She has also recently seen GI.      Urinary urgency - Primary    Check urinalysis today.      Relevant Orders   POCT Urinalysis Dipstick   Other Visit Diagnoses     Need for immunization against influenza       Relevant Orders   Flu Vaccine QUAD High Dose(Fluad) (Completed)      Return in about 3 months (around 06/26/2022).   Lindsay Rumps, MD [place]

## 2022-03-27 NOTE — Patient Instructions (Signed)
Nice to see you. We will contact you with your urine results.  

## 2022-03-27 NOTE — Assessment & Plan Note (Signed)
Improved.  She will continue buspirone 15 mg nightly and gabapentin 200 mg twice daily.  She will continue to see neurology.

## 2022-03-27 NOTE — Assessment & Plan Note (Signed)
Check urinalysis today

## 2022-03-27 NOTE — Assessment & Plan Note (Signed)
Asymptomatic.  She will remain on buspirone 15 mg daily.

## 2022-03-27 NOTE — Assessment & Plan Note (Addendum)
Cardiology felt as though her symptoms may be GI related.  She will follow-up with cardiology as planned.  She has also recently seen GI.

## 2022-03-28 LAB — URINE CULTURE
MICRO NUMBER:: 13931213
SPECIMEN QUALITY:: ADEQUATE

## 2022-03-30 ENCOUNTER — Encounter: Payer: Self-pay | Admitting: Oncology

## 2022-03-30 ENCOUNTER — Ambulatory Visit: Payer: PPO | Admitting: Cardiology

## 2022-03-30 NOTE — Progress Notes (Signed)
Hematology/Oncology Consult note [facility]  [phone] [phone]  Patient Care Team: Leone Haven, MD as PCP - General (Family Medicine) End, Harrell Gave, MD as PCP - Cardiology (Cardiology) Sindy Guadeloupe, MD as Consulting Physician (Hematology and Oncology)   Name of the patient: Lindsay Tran  [id number]  02-24-1946   Date of visit: 03/30/22  Diagnosis-iron deficiency anemia  Chief complaint/ Reason for visit-routine follow-up of iron deficiency anemia  Heme/Onc history: Patient is a 76 year old female who was seen in the past for thrombocytosis which subsequently resolved.  Presently she has been referred to [place] for iron deficiency anemia.  Most recent CBC from 12/20/2020 Showed white count of 5.3, H&H of 13.4/41 with a platelet count of 238.  Ferritin was low at 14.5 with an iron saturation of 16.6.  Patient reports having significant fatigue.  Denies any consistent use of NSAIDs.  Denies any blood loss in her stool or urine.  She has undergone colonoscopy with Dr. Alice Reichert in mid November 2021 which showed nonbleeding internal hemorrhoids and diverticulosis in the sigmoid colon.  Last endoscopy was in 2018 which showed evidence of gastritis but otherwise normal esophagus.  Interval history-patient was evaluated by neurology for her tremors and was started on buspirone and gabapentin.  Overall she is doing well for her age.  ECOG PS- 1 Pain scale- 0  Review of systems- Review of Systems  Constitutional:  Negative for chills, fever, malaise/fatigue and weight loss.  HENT:  Negative for congestion, ear discharge and nosebleeds.   Eyes:  Negative for blurred vision.  Respiratory:  Negative for cough, hemoptysis, sputum production, shortness of breath and wheezing.   Cardiovascular:  Negative for chest pain, palpitations, orthopnea and claudication.  Gastrointestinal:  Negative for abdominal pain, blood in stool, constipation,  diarrhea, heartburn, melena, nausea and vomiting.  Genitourinary:  Negative for dysuria, flank pain, frequency, hematuria and urgency.  Musculoskeletal:  Negative for back pain, joint pain and myalgias.  Skin:  Negative for rash.  Neurological:  Negative for dizziness, tingling, focal weakness, seizures, weakness and headaches.  Endo/Heme/Allergies:  Does not bruise/bleed easily.  Psychiatric/Behavioral:  Negative for depression and suicidal ideas. The patient does not have insomnia.       Allergies  Allergen Reactions   Gluten Meal Rash     Past Medical History:  Diagnosis Date   Abdominal pain    Adjustment disorder with anxiety    Anemia    Anxiety    adjustment disorder with anxiety   Arthritis    Atrophic vaginitis    BPPV (benign paroxysmal positional vertigo)    CAD in native artery    Cataract    Chronic diarrhea    Chronic pain of right knee    Coronary artery disease    Dysphagia    Dysphasia    Dysuria    Family history of colon cancer    Gastric ulcer 1980's   Gastric ulcer    Gastroesophageal hernia    GERD (gastroesophageal reflux disease)    High cholesterol    Hyperlipidemia    Hypertension    Macular degeneration    Osteoporosis    Osteoporosis    post-menopausal   Pedal edema    Psoriasis    Schatzki's ring 05/02/2013   Schatzki's ring    Scoliosis    Scoliosis    UTI (urinary tract infection)      Past Surgical History:  Procedure Laterality Date   ABDOMINAL HYSTERECTOMY  CARDIAC CATHETERIZATION     CHOLECYSTECTOMY     COLONOSCOPY     COLONOSCOPY N/A 11/12/2014   Procedure: COLONOSCOPY;  Surgeon: Manya Silvas, MD;  Location: [facility] ENDOSCOPY;  Service: Endoscopy;  Laterality: N/A;   COLONOSCOPY N/A 05/24/2020   Procedure: COLONOSCOPY;  Surgeon: Toledo, Benay Pike, MD;  Location: [facility] ENDOSCOPY;  Service: Endoscopy;  Laterality: N/A;   DIAGNOSTIC LAPAROSCOPY     paraesphogeal hernia   ESOPHAGOGASTRODUODENOSCOPY      ESOPHAGOGASTRODUODENOSCOPY (EGD) WITH PROPOFOL N/A 04/16/2017   Procedure: ESOPHAGOGASTRODUODENOSCOPY (EGD) WITH PROPOFOL;  Surgeon: Manya Silvas, MD;  Location: [facility] ENDOSCOPY;  Service: Endoscopy;  Laterality: N/A;   ESOPHAGOGASTRODUODENOSCOPY (EGD) WITH PROPOFOL N/A 02/10/2021   Procedure: ESOPHAGOGASTRODUODENOSCOPY (EGD) WITH PROPOFOL;  Surgeon: [name], MD;  Location: [facility] ENDOSCOPY;  Service: Gastroenterology;  Laterality: N/A;   ESOPHAGOGASTRODUODENOSCOPY (EGD) WITH PROPOFOL N/A 11/28/2021   Procedure: ESOPHAGOGASTRODUODENOSCOPY (EGD) WITH PROPOFOL;  Surgeon: Lesly Rubenstein, MD;  Location: [facility] ENDOSCOPY;  Service: Endoscopy;  Laterality: N/A;   GIVENS CAPSULE STUDY N/A 03/08/2021   Procedure: GIVENS CAPSULE STUDY;  Surgeon: [name], MD;  Location: [facility] ENDOSCOPY;  Service: Gastroenterology;  Laterality: N/A;   HERNIA REPAIR     inguinal hernia   HERNIA REPAIR     umbilical hernia    Social History   Socioeconomic History   Marital status: Single    Spouse name: Not on file   Number of children: Not on file   Years of education: Not on file   Highest education level: Not on file  Occupational History   Not on file  Tobacco Use   Smoking status: Never   Smokeless tobacco: Never  Vaping Use   Vaping Use: Never used  Substance and Sexual Activity   Alcohol use: No    Alcohol/week: 0.0 standard drinks of alcohol   Drug use: No   Sexual activity: Never  Other Topics Concern   Not on file  Social History Narrative   Divorced   Retired as a Scientist, research (medical)   12 + years of school    Social Determinants of Health   Financial Resource Strain: [place]  (02/07/2021)   Overall Financial Resource Strain (CARDIA)    Difficulty of Paying Living Expenses: Not hard at [place]: No [place] (02/07/2021)   Hunger Vital Sign    Worried About Running Out of Food in the Last Year: Never true    [place] in the Last Year: Never true  Transportation  Needs: No Transportation Needs (02/07/2021)   PRAPARE - Hydrologist (Medical): No    Lack of Transportation (Non-Medical): No  Physical Activity: Not on file  Stress: No Stress Concern Present (02/07/2021)   [place]    Feeling of Stress : Not at all  Social Connections: Unknown (02/07/2021)   Social Connection and Isolation Panel [NHANES]    Frequency of Communication with Friends and Family: More than three times a week    Frequency of Social Gatherings with Friends and Family: More than three times a week    Attends Religious Services: Not on file    Active Member of Clubs or Organizations: Not on file    Attends Archivist Meetings: Not on file    Marital Status: Not on file  Intimate Partner Violence: Not At Risk (02/07/2021)   Humiliation, Afraid, Rape, and Kick questionnaire    Fear of  Current or Ex-Partner: No    Emotionally Abused: No    Physically Abused: No    Sexually Abused: No    Family History  Problem Relation Age of Onset   Diabetes Mother    Parkinson's disease Mother    Stroke Mother    Heart disease Mother    Cancer Father        Pancreatic   Cancer Sister        Lung   Diabetes Sister    Diabetes Sister    Alzheimer's disease Sister    Tics Sister    Stroke Brother    Restless legs syndrome Brother    Diverticulosis Daughter    Alzheimer's disease Paternal Aunt        x2 sisters   Breast cancer Neg Hx      Current Outpatient Medications:    busPIRone (BUSPAR) 7.5 MG tablet, Take 7.5 mg at night for one week, then increase to 15 mg at night., Disp: , Rfl:    clobetasol (OLUX) 0.05 % topical foam, Apply topically 2 (two) times daily., Disp: 50 g, Rfl: 0   clobetasol ointment (TEMOVATE) 2.09 %, Apply 1 application topically 2 (two) times daily as needed. For rash. Avoid face., Disp: 180 g, Rfl: 1   Cyanocobalamin (VITAMIN B 12 PO), Take by mouth  daily., Disp: , Rfl:    gabapentin (NEURONTIN) 100 MG capsule, Take 100 mg twice daily for one week, then increase to 200 mg twice daily for tremors., Disp: , Rfl:    ketoconazole (NIZORAL) 2 % cream, Apply 1 application. topically daily., Disp: , Rfl:    magnesium oxide (MAG-OX) 400 MG tablet, Take 1 tablet by mouth daily., Disp: , Rfl:    metoprolol tartrate (LOPRESSOR) 100 MG tablet, Take 1 tablet (100 mg total) by mouth 2 (two) times daily., Disp: 180 tablet, Rfl: 2   Multiple Vitamins-Minerals (PRESERVISION AREDS 2 PO), Take by mouth daily., Disp: , Rfl:    mupirocin ointment (BACTROBAN) 2 %, Place 1 application into the nose 2 (two) times daily., Disp: 22 g, Rfl: 1   Omega-3 Fatty Acids (OMEGA 3 500 PO), Take 1 capsule by mouth daily. , Disp: , Rfl:    potassium chloride (K-DUR) 10 MEQ tablet, Take 10 mEq by mouth daily., Disp: , Rfl:    triamcinolone ointment (KENALOG) 0.1 %, Apply topically 2 (two) times daily., Disp: 240 g, Rfl: 0   pantoprazole (PROTONIX) 40 MG tablet, Take 1 tablet (40 mg total) by mouth daily., Disp: 90 tablet, Rfl: 3   valACYclovir (VALTREX) 500 MG tablet, Take 1 tablet (500 mg total) by mouth 2 (two) times daily., Disp: 60 tablet, Rfl: 2  Physical exam:  Vitals:   03/24/22 1212  BP: (!) 162/85  Pulse: 62  Resp: 16  Temp: (!) 96.4 F (35.8 C)  TempSrc: Tympanic  SpO2: 99%  Weight: 138 lb (62.6 kg)   Physical Exam Constitutional:      General: She is not in acute distress. Cardiovascular:     Rate and Rhythm: Normal rate and regular rhythm.     Heart sounds: Normal heart sounds.  Pulmonary:     Effort: Pulmonary effort is normal.     Breath sounds: Normal breath sounds.  Abdominal:     General: Bowel sounds are normal.     Palpations: Abdomen is soft.  Skin:    General: Skin is warm and dry.  Neurological:     Mental Status: She is alert and oriented  to person, place, and time.         Latest Ref Rng & Units 12/20/2021   10:41 AM  CMP   Glucose 70 - 99 mg/dL 89   BUN 6 - 23 mg/dL 21   Creatinine 0.40 - 1.20 mg/dL 0.69   Sodium 135 - 145 mEq/L 135   Potassium 3.5 - 5.1 mEq/L 4.3   Chloride 96 - 112 mEq/L 100   CO2 19 - 32 mEq/L 25   Calcium 8.4 - 10.5 mg/dL 9.6       Latest Ref Rng & Units 03/24/2022   11:10 AM  CBC  WBC 4.0 - 10.5 K/uL 4.3   Hemoglobin 12.0 - 15.0 g/dL 13.7   Hematocrit 36.0 - 46.0 % 43.0   Platelets 150 - 400 K/uL 223     No images are attached to the encounter.  LONG TERM MONITOR (3-14 DAYS)  Result Date: 03/06/2022   The patient was monitored for 13 days, 16 hours.   The predominant rhythm was sinus with an average rate of 65 bpm (range 51-89 bpm in sinus).   There were rare PACs and PVCs.   37 supraventricular runs were observed, lasting up to 19 beats with a maximum rate of 158 bpm.   No sustained arrhythmia or prolonged pause occurred.   Patient triggered events corresponded to sinus rhythm and PACs. Predominantly sinus rhythm with several episodes of brief PSVT, as well as rare PACs and PVCs.     Assessment and plan- Patient is a 76 y.o. female here for routine follow-up of iron deficiency anemia  Patient last received IV iron in July 2022.  Her hemoglobin has remained stable between 13-14  since then.  Ferritin and iron studies are presently normal and she does not require any IV iron at this time.  Given the stability of her hemoglobin patient does not require any follow-up with me and can continue to follow-up with Dr. Caryl Bis.  She can be referred to [place] in the future if questions or concerns arise   Visit Diagnosis 1. Iron deficiency anemia, unspecified iron deficiency anemia type      Dr. Randa Evens, MD, MPH [facility] [phone] 03/30/2022 12:18 PM

## 2022-03-31 ENCOUNTER — Encounter: Payer: Self-pay | Admitting: Cardiology

## 2022-03-31 ENCOUNTER — Ambulatory Visit: Payer: PPO | Attending: Cardiology | Admitting: Cardiology

## 2022-03-31 VITALS — BP 124/80 | HR 58 | Ht 60.0 in | Wt 139.0 lb

## 2022-03-31 DIAGNOSIS — I739 Peripheral vascular disease, unspecified: Secondary | ICD-10-CM

## 2022-03-31 DIAGNOSIS — R011 Cardiac murmur, unspecified: Secondary | ICD-10-CM

## 2022-03-31 DIAGNOSIS — R0602 Shortness of breath: Secondary | ICD-10-CM | POA: Diagnosis not present

## 2022-03-31 DIAGNOSIS — I2510 Atherosclerotic heart disease of native coronary artery without angina pectoris: Secondary | ICD-10-CM | POA: Diagnosis not present

## 2022-03-31 DIAGNOSIS — R072 Precordial pain: Secondary | ICD-10-CM | POA: Diagnosis not present

## 2022-03-31 DIAGNOSIS — R002 Palpitations: Secondary | ICD-10-CM

## 2022-03-31 DIAGNOSIS — I10 Essential (primary) hypertension: Secondary | ICD-10-CM | POA: Diagnosis not present

## 2022-03-31 MED ORDER — METOPROLOL TARTRATE 100 MG PO TABS: ORAL_TABLET | ORAL | 3 refills | Status: DC

## 2022-03-31 MED ORDER — AMLODIPINE BESYLATE 2.5 MG PO TABS: 2.5000 mg | ORAL_TABLET | Freq: Every day | ORAL | 3 refills | Status: DC

## 2022-03-31 NOTE — Patient Instructions (Addendum)
Medication Instructions:  Your physician has recommended you make the following change in your medication: START: Amlodipine 2.'5mg'$  daily at bedtime  DECREASE: Metoprolol '100mg'$  in the morning and '50mg'$   in the evening (0.5 tablet)  *If you need a refill your cardiac medications before your next appointment, please call your pharmacy*   Lab Work: NONE ordered at this time of appointment   If you have labs (blood work) drawn today and your tests are completely normal, you will receive your results only by: [place] (if you have MyChart) OR A paper copy in the mail If you have any lab test that is abnormal or we need to change your treatment, we will call you to review the results.   Testing/Procedures: Your physician has requested that you have an ankle brachial index (ABI). During this test an ultrasound and blood pressure cuff are used to evaluate the arteries that supply the arms and legs with blood. Allow thirty minutes for this exam. There are no restrictions or special instructions.   Your physician has requested that you have a lower extremity arterial exercise duplex. During this test, exercise and ultrasound are used to evaluate arterial blood flow in the legs. Allow one hour for this exam. There are no restrictions or special instructions.   Follow-Up: At [facility], you and your health needs are our priority.  As part of our continuing mission to provide you with exceptional heart care, we have created designated Provider Care Teams.  These Care Teams include your primary Cardiologist (physician) and Advanced Practice Providers (APPs -  Physician Assistants and Nurse Practitioners) who all work together to provide you with the care you need, when you need it.  We recommend signing up for the patient portal called "MyChart".  Sign up information is provided on this After Visit Summary.  MyChart is used to connect with patients for Virtual Visits (Telemedicine).   Patients are able to view lab/test results, encounter notes, upcoming appointments, etc.  Non-urgent messages can be sent to your provider as well.   To learn more about what you can do with MyChart, go to [email or web address].    Your next appointment:   4 week(s)  The format for your next appointment:   In Person  Provider:   Nelva Bush, MD or Gerrie Nordmann, NP

## 2022-04-12 DIAGNOSIS — R29818 Other symptoms and signs involving the nervous system: Secondary | ICD-10-CM | POA: Diagnosis not present

## 2022-04-12 DIAGNOSIS — R251 Tremor, unspecified: Secondary | ICD-10-CM | POA: Diagnosis not present

## 2022-04-12 DIAGNOSIS — R42 Dizziness and giddiness: Secondary | ICD-10-CM | POA: Diagnosis not present

## 2022-04-12 DIAGNOSIS — H9319 Tinnitus, unspecified ear: Secondary | ICD-10-CM | POA: Diagnosis not present

## 2022-04-12 DIAGNOSIS — R262 Difficulty in walking, not elsewhere classified: Secondary | ICD-10-CM | POA: Diagnosis not present

## 2022-04-19 ENCOUNTER — Ambulatory Visit: Payer: PPO | Admitting: Dermatology

## 2022-04-19 DIAGNOSIS — D692 Other nonthrombocytopenic purpura: Secondary | ICD-10-CM

## 2022-04-19 DIAGNOSIS — L409 Psoriasis, unspecified: Secondary | ICD-10-CM | POA: Diagnosis not present

## 2022-04-19 DIAGNOSIS — L649 Androgenic alopecia, unspecified: Secondary | ICD-10-CM

## 2022-04-19 DIAGNOSIS — Z79899 Other long term (current) drug therapy: Secondary | ICD-10-CM | POA: Diagnosis not present

## 2022-04-19 DIAGNOSIS — L659 Nonscarring hair loss, unspecified: Secondary | ICD-10-CM

## 2022-04-19 DIAGNOSIS — L304 Erythema intertrigo: Secondary | ICD-10-CM

## 2022-04-19 DIAGNOSIS — L4050 Arthropathic psoriasis, unspecified: Secondary | ICD-10-CM

## 2022-04-19 MED ORDER — KETOCONAZOLE 2 % EX CREA: TOPICAL_CREAM | CUTANEOUS | 3 refills | Status: DC

## 2022-04-19 MED ORDER — TRIAMCINOLONE ACETONIDE 0.1 % EX OINT: TOPICAL_OINTMENT | Freq: Two times a day (BID) | CUTANEOUS | 0 refills | Status: DC

## 2022-04-19 NOTE — Patient Instructions (Addendum)
Recommend minoxidil 5% (Rogaine for men) solution or foam to be applied to the scalp and left in. This should ideally be used twice daily for best results but it helps with hair regrowth when used at least three times per week. Rogaine initially can cause increased hair shedding for the first few weeks but this will stop with continued use. In studies, people who used minoxidil (Rogaine) for at least 6 months had thicker hair than people who did not. Minoxidil topical (Rogaine) only works as long as it continues to be used. If if it is no longer used then the hair it has been helping to regrow can fall out. Minoxidil topical (Rogaine) can cause increased facial hair growth which can usually be managed easily with a battery-operated hair trimmer. If facial hair growth is bothersome, switching to the 2% women's version can decrease the risk of unwanted facial hair growth.     Due to recent changes in healthcare laws, you may see results of your pathology and/or laboratory studies on MyChart before the doctors have had a chance to review them. We understand that in some cases there may be results that are confusing or concerning to you. Please understand that not all results are received at the same time and often the doctors may need to interpret multiple results in order to provide you with the best plan of care or course of treatment. Therefore, we ask that you please give us 2 business days to thoroughly review all your results before contacting the office for clarification. Should we see a critical lab result, you will be contacted sooner.   If You Need Anything After Your Visit  If you have any questions or concerns for your doctor, please call our main line at [phone] and press option 4 to reach your doctor's medical assistant. If no one answers, please leave a voicemail as directed and we will return your call as soon as possible. Messages left after 4 pm will be answered the following business  day.   You may also send us a message via MyChart. We typically respond to MyChart messages within 1-2 business days.  For prescription refills, please ask your pharmacy to contact our office. Our fax number is [phone].  If you have an urgent issue when the clinic is closed that cannot wait until the next business day, you can page your doctor at the number below.    Please note that while we do our best to be available for urgent issues outside of office hours, we are not available 24/7.   If you have an urgent issue and are unable to reach us, you may choose to seek medical care at your doctor's office, retail clinic, urgent care center, or emergency room.  If you have a medical emergency, please immediately call 911 or go to the emergency department.  Pager Numbers  - Dr. Kowalski: [phone]  - Dr. Moye: [phone]  - Dr. Stewart: [phone]  In the event of inclement weather, please call our main line at [phone] for an update on the status of any delays or closures.  Dermatology Medication Tips: Please keep the boxes that topical medications come in in order to help keep track of the instructions about where and how to use these. Pharmacies typically print the medication instructions only on the boxes and not directly on the medication tubes.   If your medication is too expensive, please contact our office at [phone] option 4 or send us a message through   MyChart.   We are unable to tell what your co-pay for medications will be in advance as this is different depending on your insurance coverage. However, we may be able to find a substitute medication at lower cost or fill out paperwork to get insurance to cover a needed medication.   If a prior authorization is required to get your medication covered by your insurance company, please allow us 1-2 business days to complete this process.  Drug prices often vary depending on where the prescription is filled and  some pharmacies may offer cheaper prices.  The website www.goodrx.com contains coupons for medications through different pharmacies. The prices here do not account for what the cost may be with help from insurance (it may be cheaper with your insurance), but the website can give you the price if you did not use any insurance.  - You can print the associated coupon and take it with your prescription to the pharmacy.  - You may also stop by our office during regular business hours and pick up a GoodRx coupon card.  - If you need your prescription sent electronically to a different pharmacy, notify our office through [place] MyChart or by phone at [phone] option 4.     Si Usted Necesita Algo Despus de Su Visita  Tambin puede enviarnos un mensaje a travs de MyChart. Por lo general respondemos a los mensajes de MyChart en el transcurso de 1 a 2 das hbiles.  Para renovar recetas, por favor pida a su farmacia que se ponga en contacto con nuestra oficina. Nuestro nmero de fax es el [phone].  Si tiene un asunto urgente cuando la clnica est cerrada y que no puede esperar hasta el siguiente da hbil, puede llamar/localizar a su doctor(a) al nmero que aparece a continuacin.   Por favor, tenga en cuenta que aunque hacemos todo lo posible para estar disponibles para asuntos urgentes fuera del horario de oficina, no estamos disponibles las 24 horas del da, los 7 das de la semana.   Si tiene un problema urgente y no puede comunicarse con nosotros, puede optar por buscar atencin mdica  en el consultorio de su doctor(a), en una clnica privada, en un centro de atencin urgente o en una sala de emergencias.  Si tiene una emergencia mdica, por favor llame inmediatamente al 911 o vaya a la sala de emergencias.  Nmeros de bper  - Dr. Kowalski: [phone]  - Dra. Moye: [phone]  - Dra. Stewart: [phone]  En caso de inclemencias del tiempo, por favor llame a nuestra  lnea principal al [phone] para una actualizacin sobre el estado de cualquier retraso o cierre.  Consejos para la medicacin en dermatologa: Por favor, guarde las cajas en las que vienen los medicamentos de uso tpico para ayudarle a seguir las instrucciones sobre dnde y cmo usarlos. Las farmacias generalmente imprimen las instrucciones del medicamento slo en las cajas y no directamente en los tubos del medicamento.   Si su medicamento es muy caro, por favor, pngase en contacto con nuestra oficina llamando al [phone] y presione la opcin 4 o envenos un mensaje a travs de MyChart.   No podemos decirle cul ser su copago por los medicamentos por adelantado ya que esto es diferente dependiendo de la cobertura de su seguro. Sin embargo, es posible que podamos encontrar un medicamento sustituto a menor costo o llenar un formulario para que el seguro cubra el medicamento que se considera necesario.   Si se requiere una autorizacin previa   para que su compaa de seguros cubra su medicamento, por favor permtanos de 1 a 2 das hbiles para completar este proceso.  Los precios de los medicamentos varan con frecuencia dependiendo del lugar de dnde se surte la receta y alguna farmacias pueden ofrecer precios ms baratos.  El sitio web www.goodrx.com tiene cupones para medicamentos de diferentes farmacias. Los precios aqu no tienen en cuenta lo que podra costar con la ayuda del seguro (puede ser ms barato con su seguro), pero el sitio web puede darle el precio si no utiliz ningn seguro.  - Puede imprimir el cupn correspondiente y llevarlo con su receta a la farmacia.  - Tambin puede pasar por nuestra oficina durante el horario de atencin regular y recoger una tarjeta de cupones de GoodRx.  - Si necesita que su receta se enve electrnicamente a una farmacia diferente, informe a nuestra oficina a travs de MyChart de [place] o por telfono llamando al [phone] y presione la  opcin 4.  

## 2022-04-19 NOTE — Progress Notes (Addendum)
Follow-Up Visit   Subjective  Lindsay Tran is a 76 y.o. female who presents for the following: Follow-up.  Patient presents for follow-up psoriasis, PsA. Patient was taken off MTX by rheumatologist (Dr Posey Pronto) due to side effects (hairloss and fatigue), and was started on leflunomide. She had to d/c due to side effects (diarrhea), as well. She filled out paperwork for Humira, but wasn't covered by insurance. She has been off all medicines for 6 weeks and has flared on chest, back, hips, legs. She is using TMC ointment and clobetasol ointment as needed. Patient has increased joint pain and stiffness off of medicines. She has a hard time walking.   The following portions of the chart were reviewed this encounter and updated as appropriate:       Review of Systems:  No other skin or systemic complaints except as noted in HPI or Assessment and Plan.  Objective  Well appearing patient in no apparent distress; mood and affect are within normal limits.  A focused examination was performed including face, scalp, trunk, extremities. Relevant physical exam findings are noted in the Assessment and Plan.  trunk, extremities Pink scaly patches on the chest, back, abdomen, hips, arms. ~15% BSA  Inframammary Folds Clear today  Scalp Hair thinning on the mid and posterior crown.    Assessment & Plan  Psoriasis trunk, extremities  With PsA. Chronic and persistent condition with duration or expected duration over one year. Condition is bothersome/symptomatic for patient. Psoriasis currently flared while off of medicines.   Pt has Irritable Bowel Syndrome, not inflammatory bowel disease.  Psoriasis is a chronic non-curable, but treatable genetic/hereditary disease that may have other systemic features affecting other organ systems such as joints (Psoriatic Arthritis). It is associated with an increased risk of inflammatory bowel disease, heart disease, non-alcoholic fatty liver disease, and  depression.    Patient was prescribed Humira by rheumatologist, but was too expensive under insurance plan.   CBC reviewed from 03/22/22, wnl. Additional labs ordered.  Pending labs, will send in Cosentyx injections. Patient given [employer] Patient Assistance papers to fill out and return to office.  Reviewed risks of biologics including immunosuppression, infections, injection site reaction, and failure to improve condition. Goal is control of skin condition, not cure.  Some older biologics such as Humira and Enbrel may slightly increase risk of malignancy and may worsen congestive heart failure.  Talz and Cosentyx may cause inflammatory bowel disease to flare. The use of biologics requires long term medication management, including periodic office visits and monitoring of blood work.    Continue TMC 0.1% ointment QD/BID to aa rash until improved. Avoid face, groin, axilla.  Topical steroids (such as triamcinolone, fluocinolone, fluocinonide, mometasone, clobetasol, halobetasol, betamethasone, hydrocortisone) can cause thinning and lightening of the skin if they are used for too long in the same area. Your physician has selected the right strength medicine for your problem and area affected on the body. Please use your medication only as directed by your physician to prevent side effects.    Comprehensive metabolic panel - trunk, extremities  Hepatitis B surface antibody,qualitative - trunk, extremities  Hepatitis B surface antigen - trunk, extremities  Hepatitis C antibody - trunk, extremities  QuantiFERON-TB Gold Plus - trunk, extremities  triamcinolone ointment (KENALOG) 0.1 % - trunk, extremities Apply topically 2 (two) times daily. Use until rash improved. Avoid face, groin, underarms.  Erythema intertrigo Inframammary Folds  Chronic and persistent condition with duration or expected duration over one year. Condition is  symptomatic / bothersome to patient. Improved today.  Needs  rf  Intertrigo is a chronic recurrent rash that occurs in skin fold areas that may be associated with friction; heat; moisture; yeast; fungus; and bacteria.  It is exacerbated by increased movement / activity; sweating; and higher atmospheric temperature.  Restart ketoconazole 2% cream Apply under breasts QD, BID with flares dsp 60g 3Rf.  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  Ferritin reviewed from 03/24/22 and TSH reviewed from 12/20/21, wnl  Recommend minoxidil 5% (Rogaine for men) solution or foam to be applied to the scalp and left in. This should ideally be used twice daily for best results but it helps with hair regrowth when used at least three times per week. Rogaine initially can cause increased hair shedding for the first few weeks but this will stop with continued use. In studies, people who used minoxidil (Rogaine) for at least 6 months had thicker hair than people who did not. Minoxidil topical (Rogaine) only works as long as it continues to be used. If if it is no longer used then the hair it has been helping to regrow can fall out. Minoxidil topical (Rogaine) can cause increased facial hair growth which can usually be managed easily with a battery-operated hair trimmer. If facial hair growth is bothersome, switching to the 2% women's version can decrease the risk of unwanted facial hair growth.   Purpura - Chronic; persistent and recurrent.  Treatable, but not curable. - Violaceous macules and patches - Benign - Related to trauma, age, sun damage and/or use of blood thinners, chronic use of topical and/or oral steroids - Observe - Can use OTC arnica containing moisturizer such as Dermend Bruise Formula if desired - Call for worsening or other concerns  Return in about 1 month (around 05/20/2022) for Psoriasis.  IJamesetta Orleans, CMA, am acting as scribe for Brendolyn Patty, MD .  Documentation: I have reviewed the above documentation for accuracy and  completeness, and I agree with the above.  Brendolyn Patty MD

## 2022-04-22 LAB — COMPREHENSIVE METABOLIC PANEL
ALT: 9 IU/L (ref 0–32)
AST: 17 IU/L (ref 0–40)
Albumin/Globulin Ratio: 1.8 (ref 1.2–2.2)
Albumin: 4.2 g/dL (ref 3.8–4.8)
Alkaline Phosphatase: 68 IU/L (ref 44–121)
BUN/Creatinine Ratio: 14 (ref 12–28)
BUN: 9 mg/dL (ref 8–27)
Bilirubin Total: 0.5 mg/dL (ref 0.0–1.2)
CO2: 24 mmol/L (ref 20–29)
Calcium: 9.8 mg/dL (ref 8.7–10.3)
Chloride: 102 mmol/L (ref 96–106)
Creatinine, Ser: 0.66 mg/dL (ref 0.57–1.00)
Globulin, Total: 2.3 g/dL (ref 1.5–4.5)
Glucose: 92 mg/dL (ref 70–99)
Potassium: 4.2 mmol/L (ref 3.5–5.2)
Sodium: 143 mmol/L (ref 134–144)
Total Protein: 6.5 g/dL (ref 6.0–8.5)
eGFR: 90 mL/min/{1.73_m2} (ref >59)

## 2022-04-22 LAB — HEPATITIS B SURFACE ANTIGEN: Hepatitis B Surface Ag: NEGATIVE

## 2022-04-22 LAB — QUANTIFERON-TB GOLD PLUS
QuantiFERON Mitogen Value: 9.8 IU/mL
QuantiFERON Nil Value: 0.06 IU/mL
QuantiFERON TB1 Ag Value: 0.17 IU/mL
QuantiFERON TB2 Ag Value: 0.1 IU/mL
QuantiFERON-TB Gold Plus: NEGATIVE

## 2022-04-22 LAB — HEPATITIS C ANTIBODY: Hep C Virus Ab: NONREACTIVE

## 2022-04-22 LAB — HEPATITIS B SURFACE ANTIBODY,QUALITATIVE: Hep B Surface Ab, Qual: NONREACTIVE

## 2022-04-24 ENCOUNTER — Telehealth: Payer: Self-pay

## 2022-04-24 ENCOUNTER — Other Ambulatory Visit: Payer: Self-pay | Admitting: Family Medicine

## 2022-04-24 DIAGNOSIS — Z1231 Encounter for screening mammogram for malignant neoplasm of breast: Secondary | ICD-10-CM

## 2022-04-24 MED ORDER — COSENTYX SENSOREADY (300 MG) 150 MG/ML ~~LOC~~ SOAJ: 300.0000 mg | SUBCUTANEOUS | 0 refills | Status: DC

## 2022-04-24 MED ORDER — COSENTYX SENSOREADY (300 MG) 150 MG/ML ~~LOC~~ SOAJ: 300.0000 mg | SUBCUTANEOUS | 4 refills | Status: DC

## 2022-04-24 NOTE — Telephone Encounter (Signed)
-----   Message from Brendolyn Patty, MD sent at 04/24/2022 11:15 AM EDT ----- TB, Hep panel is negative, CMP normal, 03/22/22 CBC normal, send in Cosentyx as per OV  - please call patient

## 2022-04-24 NOTE — Telephone Encounter (Signed)
Advised pt of lab results.  Cosentyx sent to [facility]

## 2022-05-04 ENCOUNTER — Other Ambulatory Visit: Payer: Self-pay

## 2022-05-04 MED ORDER — COSENTYX UNOREADY 300 MG/2ML ~~LOC~~ SOAJ: 2.0000 mL | SUBCUTANEOUS | 5 refills | Status: DC

## 2022-05-04 MED ORDER — COSENTYX UNOREADY 300 MG/2ML ~~LOC~~ SOAJ: 2.0000 mL | SUBCUTANEOUS | 0 refills | Status: DC

## 2022-05-04 NOTE — Progress Notes (Signed)
Cosentyx RX sent to [facility] as[facility] pharmacy. aw

## 2022-05-08 ENCOUNTER — Encounter (INDEPENDENT_AMBULATORY_CARE_PROVIDER_SITE_OTHER): Payer: Self-pay

## 2022-05-09 ENCOUNTER — Ambulatory Visit: Payer: PPO | Attending: Cardiology

## 2022-05-09 DIAGNOSIS — I739 Peripheral vascular disease, unspecified: Secondary | ICD-10-CM | POA: Diagnosis not present

## 2022-05-10 NOTE — Progress Notes (Signed)
Cardiology Clinic Note   Patient Name: Lindsay Tran Date of Encounter: 05/12/2022  Primary Care Provider:  Leone Haven, Tran Primary Cardiologist:  Lindsay Bush, Tran  Patient Profile    76 year old female with a past medical history of coronary artery calcification, hypertension, hyperlipidemia, varicose veins, hiatal hernia, peptic ulcer disease, iron deficiency anemia, essential tremor, scoliosis, and psoriatic arthritis, who is here today for follow-up.  Past Medical History    Past Medical History:  Diagnosis Date   Abdominal pain    Adjustment disorder with anxiety    Anemia    Anxiety    adjustment disorder with anxiety   Arthritis    Atrophic vaginitis    BPPV (benign paroxysmal positional vertigo)    CAD in native artery    Cataract    Chronic diarrhea    Chronic pain of right knee    Coronary artery disease    Dysphagia    Dysphasia    Dysuria    Family history of colon cancer    Gastric ulcer 1980's   Gastric ulcer    Gastroesophageal hernia    GERD (gastroesophageal reflux disease)    High cholesterol    Hyperlipidemia    Hypertension    Macular degeneration    Osteoporosis    Osteoporosis    post-menopausal   Pedal edema    Psoriasis    Schatzki's ring 05/02/2013   Schatzki's ring    Scoliosis    Scoliosis    UTI (urinary tract infection)    Past Surgical History:  Procedure Laterality Date   ABDOMINAL HYSTERECTOMY     CARDIAC CATHETERIZATION     CHOLECYSTECTOMY     COLONOSCOPY     COLONOSCOPY N/A 11/12/2014   Procedure: COLONOSCOPY;  Surgeon: Lindsay Silvas, Tran;  Location: [facility] ENDOSCOPY;  Service: Endoscopy;  Laterality: N/A;   COLONOSCOPY N/A 05/24/2020   Procedure: COLONOSCOPY;  Surgeon: Lindsay Tran, Lindsay Pike, Tran;  Location: [facility] ENDOSCOPY;  Service: Endoscopy;  Laterality: N/A;   DIAGNOSTIC LAPAROSCOPY     paraesphogeal hernia   ESOPHAGOGASTRODUODENOSCOPY     ESOPHAGOGASTRODUODENOSCOPY (EGD) WITH PROPOFOL N/A 04/16/2017    Procedure: ESOPHAGOGASTRODUODENOSCOPY (EGD) WITH PROPOFOL;  Surgeon: Lindsay Silvas, Tran;  Location: [facility] ENDOSCOPY;  Service: Endoscopy;  Laterality: N/A;   ESOPHAGOGASTRODUODENOSCOPY (EGD) WITH PROPOFOL N/A 02/10/2021   Procedure: ESOPHAGOGASTRODUODENOSCOPY (EGD) WITH PROPOFOL;  Surgeon: Lindsay Bellows, Tran;  Location: [facility] ENDOSCOPY;  Service: Gastroenterology;  Laterality: N/A;   ESOPHAGOGASTRODUODENOSCOPY (EGD) WITH PROPOFOL N/A 11/28/2021   Procedure: ESOPHAGOGASTRODUODENOSCOPY (EGD) WITH PROPOFOL;  Surgeon: Lindsay Rubenstein, Tran;  Location: [facility] ENDOSCOPY;  Service: Endoscopy;  Laterality: N/A;   GIVENS CAPSULE STUDY N/A 03/08/2021   Procedure: GIVENS CAPSULE STUDY;  Surgeon: Lindsay Bellows, Tran;  Location: [facility] ENDOSCOPY;  Service: Gastroenterology;  Laterality: N/A;   HERNIA REPAIR     inguinal hernia   HERNIA REPAIR     umbilical hernia    Allergies  Allergies  Allergen Reactions   Gluten Meal Rash    History of Present Illness    Lindsay Tran is a 76 year old female with the previously mentioned past medical history of coronary artery calcification, hypertension, hyperlipidemia, varicose veins, hiatal hernia, peptic ulcer disease, iron deficiency anemia, essential tremor, scoliosis, and psoriatic arthritis.  Previous echocardiogram revealed LVEF 55-60%, G2 DD, left atrial size was mildly dilated, mild mitral regurgitation was also noted.  She had also worn a long term heart monitor with the predominant rhythm was sinus with average heart  rate of 65.  She did have 37 supraventricular runs that were observed lasting up to 19 beats she also had several episodes of brief PSVT as well as rare PACs and PVCs.  She was last seen in clinic 03/31/2022 where she continued to complain of esophageal spasms as well as intermittent claudication.  Her chest pain and shortness of breath had decreased at her last visit with her symptoms of claudication she was scheduled for pre and post ABIs.  Her  ABIs were completed on 05/09/2022 and revealed on the right resting right ankle-brachial index indicates noncompressible right lower extremity arteries.  The right toe brachial index is normal.  Left resting ankle-brachial index indicates noncompressible left lower extremity arteries.  The left toe brachial index is normal.  The study is largely unchanged from previous study that she had in 2019.  She returns to clinic today stating that she is doing fairly well.  She has had 1 bout of palpitations in the evening since her last visit that was nonsustained.  She denies any chest pain or current shortness of breath.  She does have some left ankle swelling related to arthritis.  Denies any claudication symptoms today on exam.  She also denies any hospitalizations or visits to the emergency department.  Home Medications    Current Outpatient Medications  Medication Sig Dispense Refill   amLODipine (NORVASC) 2.5 MG tablet Take 1 tablet (2.5 mg total) by mouth daily. 90 tablet 3   busPIRone (BUSPAR) 7.5 MG tablet Take 15 mg by mouth 2 (two) times daily.     clobetasol ointment (TEMOVATE) 9.51 % Apply 1 application topically 2 (two) times daily as needed. For rash. Avoid face. 180 g 1   Cyanocobalamin (VITAMIN B 12 PO) Take by mouth daily.     gabapentin (NEURONTIN) 100 MG capsule Take 300 mg by mouth 2 (two) times daily.     ketoconazole (NIZORAL) 2 % cream Apply under breasts once daily, twice daily with flares. 60 g 3   magnesium oxide (MAG-OX) 400 MG tablet Take 1 tablet by mouth daily.     metoprolol tartrate (LOPRESSOR) 100 MG tablet Take '100mg'$  (1 tablet)  in the morning and '50mg'$  (0.5 tablet)  in the evening. 135 tablet 3   Multiple Vitamins-Minerals (PRESERVISION AREDS 2 PO) Take by mouth daily.     Omega-3 Fatty Acids (OMEGA 3 500 PO) Take 1 capsule by mouth daily.      triamcinolone ointment (KENALOG) 0.1 % Apply topically 2 (two) times daily. Use until rash improved. Avoid face, groin,  underarms. 240 g 0   valACYclovir (VALTREX) 500 MG tablet Take 1 tablet (500 mg total) by mouth 2 (two) times daily. 60 tablet 2   pantoprazole (PROTONIX) 40 MG tablet Take 1 tablet (40 mg total) by mouth daily. (Patient not taking: Reported on 05/12/2022) 90 tablet 3   Secukinumab (COSENTYX UNOREADY) 300 MG/2ML SOAJ Inject 2 mLs into the skin as directed. Inject 300 mg into the skin as directed. On week 0, 1, 2, 3 and 4 for loading dose (Patient not taking: Reported on 05/12/2022) 10 mL 0   Secukinumab (COSENTYX UNOREADY) 300 MG/2ML SOAJ Inject 2 mLs into the skin as directed. Inject '300mg'$  SQ every 4 weeks for maintenance. (Patient not taking: Reported on 05/12/2022) 2 mL 5   No current facility-administered medications for this visit.     Family History    Family History  Problem Relation Age of Onset   Diabetes Mother  Parkinson's disease Mother    Stroke Mother    Heart disease Mother    Cancer Father        Pancreatic   Cancer Sister        Lung   Diabetes Sister    Diabetes Sister    Alzheimer's disease Sister    Tics Sister    Stroke Brother    Restless legs syndrome Brother    Diverticulosis Daughter    Alzheimer's disease Paternal Aunt        x2 sisters   Breast cancer Neg Hx    She indicated that her mother is deceased. She indicated that her father is deceased. She indicated that only one of her four sisters is alive. She indicated that both of her brothers are alive. She indicated that her daughter is alive. She indicated that the status of her paternal aunt is unknown. She indicated that the status of her neg hx is unknown.  Social History    Social History   Socioeconomic History   Marital status: Single    Spouse name: Not on file   Number of children: Not on file   Years of education: Not on file   Highest education level: Not on file  Occupational History   Not on file  Tobacco Use   Smoking status: Never   Smokeless tobacco: Never  Vaping Use    Vaping Use: Never used  Substance and Sexual Activity   Alcohol use: No    Alcohol/week: 0.0 standard drinks of alcohol   Drug use: No   Sexual activity: Never  Other Topics Concern   Not on file  Social History Narrative   Divorced   Retired as a Scientist, research (medical)   12 + years of school    Social Determinants of Health   Financial Resource Strain: [place]  (02/07/2021)   Overall Financial Resource Strain (CARDIA)    Difficulty of Paying Living Expenses: Not hard at [place]: No [place] (02/07/2021)   Hunger Vital Sign    Worried About Running Out of Food in the Last Year: Never true    [place] in the Last Year: Never true  Transportation Needs: No Transportation Needs (02/07/2021)   PRAPARE - Hydrologist (Medical): No    Lack of Transportation (Non-Medical): No  Physical Activity: Not on file  Stress: No Stress Concern Present (02/07/2021)   [place]    Feeling of Stress : Not at all  Social Connections: Unknown (02/07/2021)   Social Connection and Isolation Panel [NHANES]    Frequency of Communication with Friends and Family: More than three times a week    Frequency of Social Gatherings with Friends and Family: More than three times a week    Attends Religious Services: Not on file    Active Member of Clubs or Organizations: Not on file    Attends Archivist Meetings: Not on file    Marital Status: Not on file  Intimate Partner Violence: Not At Risk (02/07/2021)   Humiliation, Afraid, Rape, and Kick questionnaire    Fear of Current or Ex-Partner: No    Emotionally Abused: No    Physically Abused: No    Sexually Abused: No     Review of Systems    General:  No chills, fever, night sweats or weight changes.  Cardiovascular:  No chest pain, dyspnea on exertion, edema, orthopnea,  endorses occasional palpitations, paroxysmal nocturnal  dyspnea. Dermatological: No rash, lesions/masses Respiratory: No cough, dyspnea Urologic: No hematuria, dysuria Abdominal:   No nausea, vomiting, diarrhea, bright red blood per rectum, melena, or hematemesis Neurologic:  No visual changes, wkns, changes in mental status. All other systems reviewed and are otherwise negative except as noted above.   Physical Exam    VS:  BP 136/80 (BP Location: Left Arm, Patient Position: Sitting, Cuff Size: Normal)   Pulse 60   Ht 5' (1.524 m)   Wt 139 lb 9.6 oz (63.3 kg)   SpO2 98%   BMI 27.26 kg/m  , BMI Body mass index is 27.26 kg/m.     GEN: Well nourished, well developed, in no acute distress. HEENT: normal.  Glasses are on Neck: Supple, no JVD, carotid bruits, or masses. Cardiac: RRR, I/VI murmur, without rubs, or gallops. No clubbing, cyanosis, trace pretibial edema.  Radials/DP/PT 2+ and equal bilaterally.  Respiratory:  Respirations regular and unlabored, clear to auscultation bilaterally. GI: Soft, nontender, nondistended, BS + x 4. MS: no deformity or atrophy. Skin: warm and dry, no rash. Neuro:  Strength and sensation are intact. Psych: Normal affect.  Accessory Clinical Findings    ECG personally reviewed by me today-no new tracings completed today  Lab Results  Component Value Date   WBC 4.3 03/24/2022   HGB 13.7 03/24/2022   HCT 43.0 03/24/2022   MCV 93.3 03/24/2022   PLT 223 03/24/2022   Lab Results  Component Value Date   CREATININE 0.66 04/19/2022   BUN 9 04/19/2022   NA 143 04/19/2022   K 4.2 04/19/2022   CL 102 04/19/2022   CO2 24 04/19/2022   Lab Results  Component Value Date   ALT 9 04/19/2022   AST 17 04/19/2022   ALKPHOS 68 04/19/2022   BILITOT 0.5 04/19/2022   Lab Results  Component Value Date   CHOL 153 08/04/2021   HDL 60.80 08/04/2021   LDLCALC 66 08/04/2021   TRIG 131.0 08/04/2021   CHOLHDL 3 08/04/2021    Lab Results  Component Value Date   HGBA1C 5.4 07/22/2020    Assessment & Plan    1.  Chest pain that has resolved with treatment of her tremors.  She is continued on BuSpar and gabapentin.  She also denies any esophageal spasms as of late.  2.  Palpitations that have greatly improved.  She has only had 1 episode of breakthrough palpitations since her last visit that happened 1 evening.  She has continued on metoprolol 100 mg a.m. and 50 mg in the p.m. and recently was started on amlodipine 2.5 mg a daily.  3.  Hypertension with a blood pressure today of 136/80.  Which is stable.  She has been continued on her metoprolol 100 mg in the a.m. 50 mg in the p.m. and 2.5 mg of amlodipine at bedtime to help with her esophageal spasms.  Is also been encouraged to continue to monitor her pressure at home.  4.  Patient with previous complaint of intermittent claudication which she states has resolved since her last appointment.  She does have some left ankle pain related to arthritis.  She did have ABIs that were unchanged from a study from 2019 that had normal TBI results on the right and the left and arteries were noncompressible.  She does have palpable pulses to both lower extremities without discoloration.  She has been encouraged to continue with activity and there were no new medications added at this  time.  5.  Heart murmur present on physical examination.  Last echocardiogram revealed LVEF of 55 to 60% no wall motion abnormalities atrial size is moderately dilated and mild mitral valve regurgitation with aortic sclerosis without evidence of stenosis.  Continue to monitor with surveillance echocardiograms.  6.  Disposition patient return to clinic to see Tran/APP in 4 months or sooner if needed.  Shaianne Nucci, NP 05/12/2022, 1:46 PM

## 2022-05-12 ENCOUNTER — Encounter: Payer: Self-pay | Admitting: Cardiology

## 2022-05-12 ENCOUNTER — Ambulatory Visit: Payer: PPO | Attending: Cardiology | Admitting: Cardiology

## 2022-05-12 VITALS — BP 136/80 | HR 60 | Ht 60.0 in | Wt 139.6 lb

## 2022-05-12 DIAGNOSIS — R011 Cardiac murmur, unspecified: Secondary | ICD-10-CM | POA: Diagnosis not present

## 2022-05-12 DIAGNOSIS — I10 Essential (primary) hypertension: Secondary | ICD-10-CM | POA: Diagnosis not present

## 2022-05-12 DIAGNOSIS — R002 Palpitations: Secondary | ICD-10-CM | POA: Diagnosis not present

## 2022-05-12 NOTE — Patient Instructions (Signed)
Medication Instructions:  Your physician recommends that you continue on your current medications as directed. Please refer to the Current Medication list given to you today.   Labwork: None  Testing/Procedures: None  Follow-Up: Follow up in 4 months with Gerrie Nordmann  Any Other Special Instructions Will Be Listed Below (If Applicable).     If you need a refill on your cardiac medications before your next appointment, please call your pharmacy.

## 2022-05-15 ENCOUNTER — Other Ambulatory Visit: Payer: Self-pay

## 2022-05-15 MED ORDER — COSENTYX UNOREADY 300 MG/2ML ~~LOC~~ SOAJ: 2.0000 mL | SUBCUTANEOUS | 5 refills | Status: DC

## 2022-05-15 MED ORDER — COSENTYX UNOREADY 300 MG/2ML ~~LOC~~ SOAJ: 2.0000 mL | SUBCUTANEOUS | 0 refills | Status: DC

## 2022-05-15 NOTE — Progress Notes (Signed)
Spoke with [employer] [facility] application and they are needing a faxed copy of RX. aw

## 2022-05-15 NOTE — Progress Notes (Signed)
ERROR

## 2022-05-17 ENCOUNTER — Other Ambulatory Visit: Payer: Self-pay

## 2022-05-17 MED ORDER — COSENTYX SENSOREADY (300 MG) 150 MG/ML ~~LOC~~ SOAJ: 300.0000 mg | SUBCUTANEOUS | 5 refills | Status: DC

## 2022-05-17 MED ORDER — COSENTYX SENSOREADY (300 MG) 150 MG/ML ~~LOC~~ SOAJ: 300.0000 mg | SUBCUTANEOUS | 0 refills | Status: DC

## 2022-05-17 NOTE — Progress Notes (Signed)
Switched to [employer] was initially ordered

## 2022-05-22 ENCOUNTER — Other Ambulatory Visit: Payer: Self-pay

## 2022-05-22 MED ORDER — COSENTYX SENSOREADY (300 MG) 150 MG/ML ~~LOC~~ SOAJ: 300.0000 mg | SUBCUTANEOUS | 0 refills | Status: DC

## 2022-05-22 NOTE — Progress Notes (Signed)
[facility] called and said that this patients Rx shipment has gotten lost by [employer]. They are requesting a new Rx be sent for the patient to receive their medication. Escripted

## 2022-05-22 NOTE — Progress Notes (Signed)
Per [place], [employer] called and this patients Cosentyx medication has gotten lost in the mail by [employer]. They are requesting a new Rx be sent to [facility] by [employer]. Escripted

## 2022-05-24 ENCOUNTER — Ambulatory Visit: Payer: PPO | Admitting: Dermatology

## 2022-05-24 ENCOUNTER — Ambulatory Visit
Admission: RE | Admit: 2022-05-24 | Discharge: 2022-05-24 | Disposition: A | Discharge status: Home / Self Care | Payer: PPO | Source: Ambulatory Visit | Attending: Family Medicine | Admitting: Family Medicine

## 2022-05-24 DIAGNOSIS — L820 Inflamed seborrheic keratosis: Secondary | ICD-10-CM | POA: Diagnosis not present

## 2022-05-24 DIAGNOSIS — L4050 Arthropathic psoriasis, unspecified: Secondary | ICD-10-CM | POA: Diagnosis not present

## 2022-05-24 DIAGNOSIS — Z1231 Encounter for screening mammogram for malignant neoplasm of breast: Secondary | ICD-10-CM | POA: Diagnosis not present

## 2022-05-24 DIAGNOSIS — L409 Psoriasis, unspecified: Secondary | ICD-10-CM | POA: Diagnosis not present

## 2022-05-24 NOTE — Progress Notes (Signed)
Follow-Up Visit   Subjective  Lindsay Tran is a 76 y.o. female who presents for the following: Psoriasis (Trunk, extremities. Patient is using TMC 0.1% ointment QD/BID prn and clobetasol prn severe flares. She has Cosentyx and will start her loading dose today in office. ). She has a growth on her right shoulder that gets irritated.    The following portions of the chart were reviewed this encounter and updated as appropriate:       Review of Systems:  No other skin or systemic complaints except as noted in HPI or Assessment and Plan.  Objective  Well appearing patient in no apparent distress; mood and affect are within normal limits.  A focused examination was performed including face, back, shoulders, arms. Relevant physical exam findings are noted in the Assessment and Plan.  trunk; extremities Pink scaly patches on the arms, back  Right Shoulder Erythematous stuck-on, waxy papule    Assessment & Plan  Psoriasis trunk; extremities  With PsA. Chronic and persistent condition with duration or expected duration over one year. Condition is bothersome/symptomatic for patient. Not to goal, but improving.   Pt has Irritable Bowel Syndrome, not inflammatory bowel disease.   Psoriasis - severe on systemic "biologic" treatment injections.  Psoriasis is a chronic non-curable, but treatable genetic/hereditary disease that may have other systemic features affecting other organ systems such as joints (Psoriatic Arthritis).  It is linked with heart disease, inflammatory bowel disease, non-alcoholic fatty liver disease, and depression. Significant skin psoriasis and/or psoriatic arthritis may have significant symptoms and affects activities of daily activity and often benefits from systemic "biologic" injection treatments.  These "biologic" treatments have some potential side effects including immunosuppression and require pre-treatment laboratory screening and periodic laboratory  monitoring and periodic in person evaluation and monitoring by the attending dermatologist physician (long term medication management).  Patient brought Cosentyx Rx in to start loading dose in office today. She was given injection training in office today. Patient self-injected Cosentyx 300 MG/2 ML to left thigh. She tolerated well.  She will continue loading dose qwk for 4 more weeks at home, then start 300 MG/2 ML q 4 wks for maintenance dose. Reviewed risks of biologics including immunosuppression, infections, injection site reaction, and failure to improve condition. Goal is control of skin condition, not cure.  Some older biologics such as Humira and Enbrel may slightly increase risk of malignancy and may worsen congestive heart failure.  Talz and Cosentyx may cause inflammatory bowel disease to flare. The use of biologics requires long term medication management, including periodic office visits and monitoring of blood work.   Continue TMC 0.1% Ointment Apply QD/BID AA prn. Avoid f/g/a. Continue clobetasol ointment QD/BID for more severe flares. Avoid f/g/a.  Topical steroids (such as triamcinolone, fluocinolone, fluocinonide, mometasone, clobetasol, halobetasol, betamethasone, hydrocortisone) can cause thinning and lightening of the skin if they are used for too long in the same area. Your physician has selected the right strength medicine for your problem and area affected on the body. Please use your medication only as directed by your physician to prevent side effects.    Related Medications triamcinolone ointment (KENALOG) 0.1 % Apply topically 2 (two) times daily. Use until rash improved. Avoid face, groin, underarms.  Inflamed seborrheic keratosis Right Shoulder  Symptomatic, irritating, patient would like treated.  Destruction of lesion - Right Shoulder  Destruction method: cryotherapy   Informed consent: discussed and consent obtained   Lesion destroyed using liquid nitrogen:  Yes   Region frozen  until ice ball extended beyond lesion: Yes   Outcome: patient tolerated procedure well with no complications   Post-procedure details: wound care instructions given   Additional details:  Prior to procedure, discussed risks of blister formation, small wound, skin dyspigmentation, or rare scar following cryotherapy. Recommend Vaseline ointment to treated areas while healing.    Return in about 3 months (around 08/24/2022) for Psoriasis.   Documentation: I have reviewed the above documentation for accuracy and completeness, and I agree with the above.  Brendolyn Patty MD

## 2022-05-24 NOTE — Patient Instructions (Addendum)
Reviewed risks of biologics including immunosuppression, infections, injection site reaction, and failure to improve condition. Goal is control of skin condition, not cure.  Some older biologics such as Humira and Enbrel may slightly increase risk of malignancy and may worsen congestive heart failure.  Talz and Cosentyx may cause inflammatory bowel disease to flare. The use of biologics requires long term medication management, including periodic office visits and monitoring of blood work.  Topical steroids (such as triamcinolone, fluocinolone, fluocinonide, mometasone, clobetasol, halobetasol, betamethasone, hydrocortisone) can cause thinning and lightening of the skin if they are used for too long in the same area. Your physician has selected the right strength medicine for your problem and area affected on the body. Please use your medication only as directed by your physician to prevent side effects.    Due to recent changes in healthcare laws, you may see results of your pathology and/or laboratory studies on MyChart before the doctors have had a chance to review them. We understand that in some cases there may be results that are confusing or concerning to you. Please understand that not all results are received at the same time and often the doctors may need to interpret multiple results in order to provide you with the best plan of care or course of treatment. Therefore, we ask that you please give [place] 2 business days to thoroughly review all your results before contacting the office for clarification. Should we see a critical lab result, you will be contacted sooner.   If You Need Anything After Your Visit  If you have any questions or concerns for your doctor, please call our main line at [phone] and press option 4 to reach your doctor's medical assistant. If no one answers, please leave a voicemail as directed and we will return your call as soon as possible. Messages left after 4 pm will be  answered the following business day.   You may also send [place] a message via [place]. We typically respond to MyChart messages within 1-2 business days.  For prescription refills, please ask your pharmacy to contact our office. Our fax number is [phone].  If you have an urgent issue when the clinic is closed that cannot wait until the next business day, you can page your doctor at the number below.    Please note that while we do our best to be available for urgent issues outside of office hours, we are not available 24/7.   If you have an urgent issue and are unable to reach [place], you may choose to seek medical care at your doctor's office, retail clinic, urgent care center, or emergency room.  If you have a medical emergency, please immediately call 911 or go to the emergency department.  Pager Numbers  - Dr. Nehemiah Massed: [phone]  - Dr. Laurence Ferrari: [phone]  - Dr. Nicole Kindred: [phone]  In the event of inclement weather, please call our main line at [phone] for an update on the status of any delays or closures.  Dermatology Medication Tips: Please keep the boxes that topical medications come in in order to help keep track of the instructions about where and how to use these. Pharmacies typically print the medication instructions only on the boxes and not directly on the medication tubes.   If your medication is too expensive, please contact our office at [phone] option 4 or send [place] a message through [place].   We are unable to tell what your co-pay for medications will be in advance as this  is different depending on your insurance coverage. However, we may be able to find a substitute medication at lower cost or fill out paperwork to get insurance to cover a needed medication.   If a prior authorization is required to get your medication covered by your insurance company, please allow [place] 1-2 business days to complete this process.  Drug prices often vary depending on  where the prescription is filled and some pharmacies may offer cheaper prices.  The website www.goodrx.com contains coupons for medications through different pharmacies. The prices here do not account for what the cost may be with help from insurance (it may be cheaper with your insurance), but the website can give you the price if you did not use any insurance.  - You can print the associated coupon and take it with your prescription to the pharmacy.  - You may also stop by our office during regular business hours and pick up a GoodRx coupon card.  - If you need your prescription sent electronically to a different pharmacy, notify our office through [facility] or by phone at [phone] option 4.     Si Usted Necesita Algo Despus de Su Visita  Tambin puede enviarnos un mensaje a travs de Pharmacist, community. Por lo general respondemos a los mensajes de MyChart en el transcurso de 1 a 2 das hbiles.  Para renovar recetas, por favor pida a su farmacia que se ponga en contacto con nuestra oficina. Harland Dingwall de fax es [place] [phone].  Si tiene un asunto urgente cuando la clnica est cerrada y que no puede esperar hasta el siguiente da hbil, puede llamar/localizar a su doctor(a) al nmero que aparece a continuacin.   Por favor, tenga en cuenta que aunque hacemos todo lo posible para estar disponibles para asuntos urgentes fuera del horario de [place], no estamos disponibles las 24 horas del da, los 7 das de la [place].   Si tiene un problema urgente y no puede comunicarse con nosotros, puede optar por buscar atencin mdica  en el consultorio de su doctor(a), en una clnica privada, en un centro de atencin urgente o en una sala de emergencias.  Si tiene Engineering geologist, por favor llame inmediatamente al 911 o vaya a la sala de emergencias.  Nmeros de bper  - Dr. Nehemiah Massed: [phone]  - Dra. Moye: [phone]  - Dra. Nicole Kindred: [phone]  En caso de inclemencias  del [place], por favor llame a Johnsie Kindred principal al [phone] para una actualizacin sobre el [place] de cualquier retraso o cierre.  Consejos para la medicacin en dermatologa: Por favor, guarde las cajas en las que vienen los medicamentos de uso tpico para ayudarle a seguir las instrucciones sobre dnde y cmo usarlos. Las farmacias generalmente imprimen las instrucciones del medicamento slo en las cajas y no directamente en los tubos del [place].   Si su medicamento es muy caro, por favor, pngase en contacto con Zigmund Daniel llamando al [phone] y presione la opcin 4 o envenos un mensaje a travs de Pharmacist, community.   No podemos decirle cul ser su copago por los medicamentos por adelantado ya que esto es diferente dependiendo de la cobertura de su seguro. Sin embargo, es posible que podamos encontrar un medicamento sustituto a Electrical engineer un formulario para que el seguro cubra el medicamento que se considera necesario.   Si se requiere una autorizacin previa para que su compaa de seguros [place] su medicamento, por favor permtanos de 1 a 2 das hbiles para  completar [employer].  Los precios de los medicamentos varan con frecuencia dependiendo del Environmental consultant de dnde se surte la receta y alguna farmacias pueden ofrecer precios ms baratos.  El sitio web www.goodrx.com tiene cupones para medicamentos de Airline pilot. Los precios aqu no tienen en cuenta lo que podra costar con la ayuda del seguro (puede ser ms barato con su seguro), pero el sitio web puede darle el precio si no utiliz Research scientist (physical sciences).  - Puede imprimir el cupn correspondiente y llevarlo con su receta a la farmacia.  - Tambin puede pasar por nuestra oficina durante el horario de atencin regular y Charity fundraiser una tarjeta de cupones de GoodRx.  - Si necesita que su receta se enve electrnicamente a una farmacia diferente, informe a nuestra oficina a travs de MyChart de [place] o por telfono  llamando al [phone] y presione la opcin 4.

## 2022-06-06 DIAGNOSIS — M7061 Trochanteric bursitis, right hip: Secondary | ICD-10-CM | POA: Diagnosis not present

## 2022-06-06 DIAGNOSIS — M19072 Primary osteoarthritis, left ankle and foot: Secondary | ICD-10-CM | POA: Diagnosis not present

## 2022-06-06 DIAGNOSIS — M25472 Effusion, left ankle: Secondary | ICD-10-CM | POA: Diagnosis not present

## 2022-06-06 DIAGNOSIS — M7672 Peroneal tendinitis, left leg: Secondary | ICD-10-CM | POA: Diagnosis not present

## 2022-06-12 DIAGNOSIS — R202 Paresthesia of skin: Secondary | ICD-10-CM | POA: Diagnosis not present

## 2022-06-12 DIAGNOSIS — R251 Tremor, unspecified: Secondary | ICD-10-CM | POA: Diagnosis not present

## 2022-06-12 DIAGNOSIS — R29818 Other symptoms and signs involving the nervous system: Secondary | ICD-10-CM | POA: Diagnosis not present

## 2022-06-26 ENCOUNTER — Ambulatory Visit (INDEPENDENT_AMBULATORY_CARE_PROVIDER_SITE_OTHER): Payer: PPO | Admitting: Family Medicine

## 2022-06-26 ENCOUNTER — Encounter: Payer: Self-pay | Admitting: Family Medicine

## 2022-06-26 VITALS — BP 120/70 | HR 61 | Temp 98.6°F | Ht 60.0 in | Wt 141.6 lb

## 2022-06-26 DIAGNOSIS — E785 Hyperlipidemia, unspecified: Secondary | ICD-10-CM | POA: Diagnosis not present

## 2022-06-26 DIAGNOSIS — M1990 Unspecified osteoarthritis, unspecified site: Secondary | ICD-10-CM

## 2022-06-26 DIAGNOSIS — F419 Anxiety disorder, unspecified: Secondary | ICD-10-CM

## 2022-06-26 DIAGNOSIS — I10 Essential (primary) hypertension: Secondary | ICD-10-CM | POA: Diagnosis not present

## 2022-06-26 DIAGNOSIS — M7989 Other specified soft tissue disorders: Secondary | ICD-10-CM

## 2022-06-26 DIAGNOSIS — E611 Iron deficiency: Secondary | ICD-10-CM

## 2022-06-26 DIAGNOSIS — Z23 Encounter for immunization: Secondary | ICD-10-CM

## 2022-06-26 DIAGNOSIS — N952 Postmenopausal atrophic vaginitis: Secondary | ICD-10-CM

## 2022-06-26 DIAGNOSIS — F32A Depression, unspecified: Secondary | ICD-10-CM | POA: Diagnosis not present

## 2022-06-26 DIAGNOSIS — H3530 Unspecified macular degeneration: Secondary | ICD-10-CM

## 2022-06-26 MED ORDER — ESTROGENS CONJUGATED 0.625 MG/GM VA CREA: TOPICAL_CREAM | VAGINAL | 12 refills | Status: DC

## 2022-06-26 NOTE — Assessment & Plan Note (Signed)
She will continue to see her orthopedist.

## 2022-06-26 NOTE — Assessment & Plan Note (Signed)
Well-controlled.  Continue amlodipine 2.5 mg daily and metoprolol 100 mg in the morning and 50 mg in the evening.

## 2022-06-26 NOTE — Assessment & Plan Note (Signed)
Stable.  I encouraged her to see her ophthalmologist in January.

## 2022-06-26 NOTE — Assessment & Plan Note (Signed)
Refill for generic Premarin given.

## 2022-06-26 NOTE — Assessment & Plan Note (Signed)
Improved.  She can continue buspirone 15 mg daily and gabapentin 300 mg twice daily.

## 2022-06-26 NOTE — Assessment & Plan Note (Signed)
Recheck levels with labs in January.  She reports she has been released by hematology.

## 2022-06-26 NOTE — Progress Notes (Signed)
Tommi Rumps, MD Phone: [phone]  Lindsay Tran is a 76 y.o. female who presents today for f/u.  HYPERTENSION Disease Monitoring Home BP Monitoring "good" Chest pain- no    Dyspnea- no Medications Compliance-  taking amlodipine, metoprolol.   Edema- chronic unchanged BMET    Component Value Date/Time   NA 143 04/19/2022 0930   K 4.2 04/19/2022 0930   CL 102 04/19/2022 0930   CO2 24 04/19/2022 0930   GLUCOSE 92 04/19/2022 0930   GLUCOSE 89 12/20/2021 1041   BUN 9 04/19/2022 0930   CREATININE 0.66 04/19/2022 0930   CALCIUM 9.8 04/19/2022 0930   Anxiety/depression: Patient notes that her anxiety and depression have improved quite a bit since she has gone on Neurontin and buspirone.  She notes no SI.  She notes palpitations have also improved.  Left ankle arthritis: Patient notes she had a cortisone shot in her left ankle recently.  Notes some benefit though she still has some swelling.  Atrophic vaginitis: She is currently using Premarin twice weekly.  It helps relieve the dry irritated sensation.  She wonders about a generic for this.  Macular degeneration: Patient reports she does have this though it has been stable since diagnosis.  She is due to see ophthalmology in January.  Social History   Tobacco Use  Smoking Status Never  Smokeless Tobacco Never    Current Outpatient Medications on File Prior to Visit  Medication Sig Dispense Refill   amLODipine (NORVASC) 2.5 MG tablet Take 1 tablet (2.5 mg total) by mouth daily. 90 tablet 3   busPIRone (BUSPAR) 7.5 MG tablet Take 15 mg by mouth 2 (two) times daily.     clobetasol ointment (TEMOVATE) 3.15 % Apply 1 application topically 2 (two) times daily as needed. For rash. Avoid face. 180 g 1   Cyanocobalamin (VITAMIN B 12 PO) Take by mouth daily.     gabapentin (NEURONTIN) 100 MG capsule Take 300 mg by mouth 2 (two) times daily.     ketoconazole (NIZORAL) 2 % cream Apply under breasts once daily, twice daily with  flares. 60 g 3   magnesium oxide (MAG-OX) 400 MG tablet Take 1 tablet by mouth daily.     metoprolol tartrate (LOPRESSOR) 100 MG tablet Take '100mg'$  (1 tablet)  in the morning and '50mg'$  (0.5 tablet)  in the evening. 135 tablet 3   Multiple Vitamins-Minerals (PRESERVISION AREDS 2 PO) Take by mouth daily.     Omega-3 Fatty Acids (OMEGA 3 500 PO) Take 1 capsule by mouth daily.      pantoprazole (PROTONIX) 40 MG tablet Take 1 tablet (40 mg total) by mouth daily. 90 tablet 3   Secukinumab, 300 MG Dose, (COSENTYX SENSOREADY, 300 MG,) 150 MG/ML SOAJ Inject 300 mg into the skin as directed. On week 0, 1, 2, 3 and 4. 10 mL 0   sertraline (ZOLOFT) 100 MG tablet      triamcinolone ointment (KENALOG) 0.1 % Apply topically 2 (two) times daily. Use until rash improved. Avoid face, groin, underarms. 240 g 0   valACYclovir (VALTREX) 500 MG tablet Take 1 tablet (500 mg total) by mouth 2 (two) times daily. 60 tablet 2   HUMIRA PEN 40 MG/0.4ML PNKT      No current facility-administered medications on file prior to visit.     ROS see history of present illness  Objective  Physical Exam Vitals:   06/26/22 1209  BP: 120/70  Pulse: 61  Temp: 98.6 F (37 C)  SpO2: 96%  BP Readings from Last 3 Encounters:  06/26/22 120/70  05/12/22 136/80  03/31/22 124/80   Wt Readings from Last 3 Encounters:  06/26/22 141 lb 9.6 oz (64.2 kg)  05/12/22 139 lb 9.6 oz (63.3 kg)  03/31/22 139 lb (63 kg)    Physical Exam Constitutional:      General: She is not in acute distress.    Appearance: She is not diaphoretic.  Cardiovascular:     Rate and Rhythm: Normal rate and regular rhythm.     Heart sounds: Normal heart sounds.  Pulmonary:     Effort: Pulmonary effort is normal.     Breath sounds: Normal breath sounds.  Skin:    General: Skin is warm and dry.  Neurological:     Mental Status: She is alert.      Assessment/Plan: Please see individual problem list.  Essential hypertension Assessment &  Plan: Well-controlled.  Continue amlodipine 2.5 mg daily and metoprolol 100 mg in the morning and 50 mg in the evening.  Orders: -     Comprehensive metabolic panel; Future -     Lipid panel; Future  Hyperlipidemia, unspecified hyperlipidemia type Assessment & Plan: Check lipid panel.  Orders: -     Comprehensive metabolic panel; Future -     Lipid panel; Future  Iron deficiency Assessment & Plan: Recheck levels with labs in January.  She reports she has been released by hematology.  Orders: -     CBC; Future -     IBC + Ferritin; Future  Atrophic vaginitis Assessment & Plan: Refill for generic Premarin given.  Orders: -     Estrogens Conjugated; One applicator twice weekly  Dispense: 42.5 g; Refill: 12  Need for pneumococcal 20-valent conjugate vaccination -     Pneumococcal conjugate vaccine 20-valent  Anxiety and depression Assessment & Plan: Improved.  She can continue buspirone 15 mg daily and gabapentin 300 mg twice daily.   Macular degeneration, unspecified laterality, unspecified type Assessment & Plan: Stable.  I encouraged her to see her ophthalmologist in January.   Leg swelling Assessment & Plan: Chronic ankle swelling which is likely related to venous insufficiency or arthritis.  She will elevate her legs is much as possible.  She will monitor for any worsening.   Arthritis Assessment & Plan: She will continue to see her orthopedist.      Health Maintenance: Prevnar 20 given today.  Return in about 5 weeks (around 07/31/2022) for labs, 6 months PCP.   Tommi Rumps, MD [place]

## 2022-06-26 NOTE — Assessment & Plan Note (Signed)
Check lipid panel  

## 2022-06-26 NOTE — Assessment & Plan Note (Signed)
Chronic ankle swelling which is likely related to venous insufficiency or arthritis.  She will elevate her legs is much as possible.  She will monitor for any worsening.

## 2022-06-27 ENCOUNTER — Encounter: Payer: Self-pay | Admitting: Family Medicine

## 2022-06-28 MED ORDER — ESTRADIOL 0.1 MG/GM VA CREA: TOPICAL_CREAM | VAGINAL | 3 refills | Status: DC

## 2022-08-01 ENCOUNTER — Other Ambulatory Visit (INDEPENDENT_AMBULATORY_CARE_PROVIDER_SITE_OTHER): Payer: PPO

## 2022-08-01 DIAGNOSIS — I10 Essential (primary) hypertension: Secondary | ICD-10-CM | POA: Diagnosis not present

## 2022-08-01 DIAGNOSIS — E611 Iron deficiency: Secondary | ICD-10-CM | POA: Diagnosis not present

## 2022-08-01 DIAGNOSIS — E785 Hyperlipidemia, unspecified: Secondary | ICD-10-CM

## 2022-08-01 LAB — CBC
HCT: 44 % (ref 36.0–46.0)
Hemoglobin: 14.3 g/dL (ref 12.0–15.0)
MCHC: 32.5 g/dL (ref 30.0–36.0)
MCV: 90.4 fl (ref 78.0–100.0)
Platelets: 270 10*3/uL (ref 150.0–400.0)
RBC: 4.87 Mil/uL (ref 3.87–5.11)
RDW: 13.8 % (ref 11.5–15.5)
WBC: 5.4 10*3/uL (ref 4.0–10.5)

## 2022-08-01 LAB — COMPREHENSIVE METABOLIC PANEL
ALT: 9 U/L (ref 0–35)
AST: 16 U/L (ref 0–37)
Albumin: 4 g/dL (ref 3.5–5.2)
Alkaline Phosphatase: 58 U/L (ref 39–117)
BUN: 20 mg/dL (ref 6–23)
CO2: 30 mEq/L (ref 19–32)
Calcium: 9.1 mg/dL (ref 8.4–10.5)
Chloride: 102 mEq/L (ref 96–112)
Creatinine, Ser: 0.67 mg/dL (ref 0.40–1.20)
GFR: 83.4 mL/min (ref >60.00)
Glucose, Bld: 99 mg/dL (ref 70–99)
Potassium: 4 mEq/L (ref 3.5–5.1)
Sodium: 140 mEq/L (ref 135–145)
Total Bilirubin: 0.7 mg/dL (ref 0.2–1.2)
Total Protein: 6.4 g/dL (ref 6.0–8.3)

## 2022-08-01 LAB — LIPID PANEL
Cholesterol: 226 mg/dL — ABNORMAL HIGH (ref 0–200)
HDL: 58.6 mg/dL (ref >39.00)
LDL Cholesterol: 133 mg/dL — ABNORMAL HIGH (ref 0–99)
NonHDL: 167.41
Total CHOL/HDL Ratio: 4
Triglycerides: 172 mg/dL — ABNORMAL HIGH (ref 0.0–149.0)
VLDL: 34.4 mg/dL (ref 0.0–40.0)

## 2022-08-01 LAB — IBC + FERRITIN
Ferritin: 133.5 ng/mL (ref 10.0–291.0)
Iron: 104 ug/dL (ref 42–145)
Saturation Ratios: 35.4 % (ref 20.0–50.0)
TIBC: 294 ug/dL (ref 250.0–450.0)
Transferrin: 210 mg/dL — ABNORMAL LOW (ref 212.0–360.0)

## 2022-08-09 ENCOUNTER — Other Ambulatory Visit: Payer: Self-pay | Admitting: Family Medicine

## 2022-08-09 DIAGNOSIS — E785 Hyperlipidemia, unspecified: Secondary | ICD-10-CM

## 2022-08-09 MED ORDER — EZETIMIBE 10 MG PO TABS: 10.0000 mg | ORAL_TABLET | Freq: Every day | ORAL | 3 refills | Status: DC

## 2022-08-28 ENCOUNTER — Ambulatory Visit: Payer: PPO | Admitting: Dermatology

## 2022-08-28 VITALS — BP 134/72 | HR 57

## 2022-08-28 DIAGNOSIS — L409 Psoriasis, unspecified: Secondary | ICD-10-CM | POA: Diagnosis not present

## 2022-08-28 DIAGNOSIS — Z79899 Other long term (current) drug therapy: Secondary | ICD-10-CM

## 2022-08-28 DIAGNOSIS — B370 Candidal stomatitis: Secondary | ICD-10-CM

## 2022-08-28 MED ORDER — CLOTRIMAZOLE 10 MG MT TROC: OROMUCOSAL | 3 refills | Status: DC

## 2022-08-28 NOTE — Patient Instructions (Addendum)
Recommend DermaNail Nail Strengthener and Cuticle Cream Set.   Due to recent changes in healthcare laws, you may see results of your pathology and/or laboratory studies on MyChart before the doctors have had a chance to review them. We understand that in some cases there may be results that are confusing or concerning to you. Please understand that not all results are received at the same time and often the doctors may need to interpret multiple results in order to provide you with the best plan of care or course of treatment. Therefore, we ask that you please give [place] 2 business days to thoroughly review all your results before contacting the office for clarification. Should we see a critical lab result, you will be contacted sooner.   If You Need Anything After Your Visit  If you have any questions or concerns for your doctor, please call our main line at [phone] and press option 4 to reach your doctor's medical assistant. If no one answers, please leave a voicemail as directed and we will return your call as soon as possible. Messages left after 4 pm will be answered the following business day.   You may also send [place] a message via [place]. We typically respond to MyChart messages within 1-2 business days.  For prescription refills, please ask your pharmacy to contact our office. Our fax number is [phone].  If you have an urgent issue when the clinic is closed that cannot wait until the next business day, you can page your doctor at the number below.    Please note that while we do our best to be available for urgent issues outside of office hours, we are not available 24/7.   If you have an urgent issue and are unable to reach [place], you may choose to seek medical care at your doctor's office, retail clinic, urgent care center, or emergency room.  If you have a medical emergency, please immediately call 911 or go to the emergency department.  Pager Numbers  - Dr. Nehemiah Massed:  [phone]  - Dr. Laurence Ferrari: [phone]  - Dr. Nicole Kindred: [phone]  In the event of inclement weather, please call our main line at [phone] for an update on the status of any delays or closures.  Dermatology Medication Tips: Please keep the boxes that topical medications come in in order to help keep track of the instructions about where and how to use these. Pharmacies typically print the medication instructions only on the boxes and not directly on the medication tubes.   If your medication is too expensive, please contact our office at [phone] option 4 or send [place] a message through [place].   We are unable to tell what your co-pay for medications will be in advance as this is different depending on your insurance coverage. However, we may be able to find a substitute medication at lower cost or fill out paperwork to get insurance to cover a needed medication.   If a prior authorization is required to get your medication covered by your insurance company, please allow [place] 1-2 business days to complete this process.  Drug prices often vary depending on where the prescription is filled and some pharmacies may offer cheaper prices.  The website www.goodrx.com contains coupons for medications through different pharmacies. The prices here do not account for what the cost may be with help from insurance (it may be cheaper with your insurance), but the website can give you the price if you did not use any insurance.  -  You can print the associated coupon and take it with your prescription to the pharmacy.  - You may also stop by our office during regular business hours and pick up a GoodRx coupon card.  - If you need your prescription sent electronically to a different pharmacy, notify our office through [facility] or by phone at [phone] option 4.     Si Usted Necesita Algo Despus de Su Visita  Tambin puede enviarnos un mensaje a travs de Pharmacist, community. Por lo general  respondemos a los mensajes de MyChart en el transcurso de 1 a 2 das hbiles.  Para renovar recetas, por favor pida a su farmacia que se ponga en contacto con nuestra oficina. Harland Dingwall de fax es [place] [phone].  Si tiene un asunto urgente cuando la clnica est cerrada y que no puede esperar hasta el siguiente da hbil, puede llamar/localizar a su doctor(a) al nmero que aparece a continuacin.   Por favor, tenga en cuenta que aunque hacemos todo lo posible para estar disponibles para asuntos urgentes fuera del horario de [place], no estamos disponibles las 24 horas del da, los 7 das de la [place].   Si tiene un problema urgente y no puede comunicarse con nosotros, puede optar por buscar atencin mdica  en el consultorio de su doctor(a), en una clnica privada, en un centro de atencin urgente o en una sala de emergencias.  Si tiene Engineering geologist, por favor llame inmediatamente al 911 o vaya a la sala de emergencias.  Nmeros de bper  - Dr. Nehemiah Massed: [phone]  - Dra. Moye: [phone]  - Dra. Nicole Kindred: [phone]  En caso de inclemencias del [place], por favor llame a Johnsie Kindred principal al [phone] para una actualizacin sobre el [place] de cualquier retraso o cierre.  Consejos para la medicacin en dermatologa: Por favor, guarde las cajas en las que vienen los medicamentos de uso tpico para ayudarle a seguir las instrucciones sobre dnde y cmo usarlos. Las farmacias generalmente imprimen las instrucciones del medicamento slo en las cajas y no directamente en los tubos del [place].   Si su medicamento es muy caro, por favor, pngase en contacto con Zigmund Daniel llamando al [phone] y presione la opcin 4 o envenos un mensaje a travs de Pharmacist, community.   No podemos decirle cul ser su copago por los medicamentos por adelantado ya que esto es diferente dependiendo de la cobertura de su seguro. Sin embargo, es posible que podamos encontrar un  medicamento sustituto a Electrical engineer un formulario para que el seguro cubra el medicamento que se considera necesario.   Si se requiere una autorizacin previa para que su compaa de seguros [place] su medicamento, por favor permtanos de 1 a 2 das hbiles para completar este proceso.  Los precios de los medicamentos varan con frecuencia dependiendo del Environmental consultant de dnde se surte la receta y alguna farmacias pueden ofrecer precios ms baratos.  El sitio web www.goodrx.com tiene cupones para medicamentos de Airline pilot. Los precios aqu no tienen en cuenta lo que podra costar con la ayuda del seguro (puede ser ms barato con su seguro), pero el sitio web puede darle el precio si no utiliz Research scientist (physical sciences).  - Puede imprimir el cupn correspondiente y llevarlo con su receta a la farmacia.  - Tambin puede pasar por nuestra oficina durante el horario de atencin regular y Charity fundraiser una tarjeta de cupones de GoodRx.  - Si necesita que su receta se enve electrnicamente a una farmacia diferente, informe  a nuestra oficina a travs de MyChart de [place] o por telfono llamando al [phone] y presione la opcin 4.

## 2022-08-28 NOTE — Progress Notes (Signed)
   Follow-Up Visit   Subjective  Lindsay Tran is a 77 y.o. female who presents for the following: Follow-up.  Patient presents for 3 month follow-up psoriasis of the trunk/extremities. She is improving with Cosentyx injections q 4 weeks. No injection site reactions. She has noticed a raw feeling in her mouth and wonders if it could be coming from the injections. She uses Clobetasol ointment and TMC ointment topically as needed.   The following portions of the chart were reviewed this encounter and updated as appropriate:       Review of Systems:  No other skin or systemic complaints except as noted in HPI or Assessment and Plan.  Objective  Well appearing patient in no apparent distress; mood and affect are within normal limits.  A focused examination was performed including face, arms, trunk. Relevant physical exam findings are noted in the Assessment and Plan.  trunk, extremities Brittle nails with distal onychoschizia. Rash on arms, legs, back is clear  mouth Mild erythema, no white patches, oral mucosa clear (pt states she brushed her tongue this morning and got film off)    Assessment & Plan  Psoriasis trunk, extremities  With PsA. Chronic and persistent condition with duration or expected duration over one year. Condition is bothersome/symptomatic for patient. Improving. Some decrease in joint pain, skin has cleared.    Counseling and coordination of care for severe psoriasis on systemic treatment  psoriasis - severe on systemic treatment.  Psoriasis is a chronic non-curable, but treatable genetic/hereditary disease that may have other systemic features affecting other organ systems such as joints (Psoriatic Arthritis).  It is linked with heart disease, inflammatory bowel disease, non-alcoholic fatty liver disease, and depression. Significant skin psoriasis and/or psoriatic arthritis may have significant symptoms and affects activities of daily activity and often benefits  from systemic treatments.  These systemic treatments have some potential side effects including immunosuppression and require pre-treatment laboratory screening and periodic laboratory monitoring and periodic in person evaluation and monitoring by the attending dermatologist physician (long term medication management).   Pt has Irritable Bowel Syndrome, not inflammatory bowel disease.    Continue Cosentyx 300 MG/37m q 4 weeks. Reviewed risks of biologics including immunosuppression, infections, injection site reaction, and failure to improve condition. Goal is control of skin condition, not cure.  Some older biologics such as Humira and Enbrel may slightly increase risk of malignancy and may worsen congestive heart failure.  Talz and Cosentyx may cause inflammatory bowel disease to flare. The use of biologics requires long term medication management, including periodic office visits and monitoring of blood work.  Continue Tmc ointment and clobetasol ointment qd/bid to aas prn  Recommend DermaNail Kit for fingers/nails BID.   Related Medications triamcinolone ointment (KENALOG) 0.1 % Apply topically 2 (two) times daily. Use until rash improved. Avoid face, groin, underarms.  Thrush, oral mouth  Secondary to Cosentyx injections.  Start clotrimazole troche 10 MG take 1 po TID prn burning/film formation dsp #90 3Rf.  clotrimazole (MYCELEX) 10 MG troche - mouth Take 1 po TID for burning and film of the mouth for oral thrush.   Return in about 6 months (around 02/26/2023) for Psoriasis.  I,Jamesetta Orleans CMA, am acting as scribe for TBrendolyn Patty MD .  Documentation: I have reviewed the above documentation for accuracy and completeness, and I agree with the above.  TBrendolyn PattyMD

## 2022-08-30 DIAGNOSIS — H353131 Nonexudative age-related macular degeneration, bilateral, early dry stage: Secondary | ICD-10-CM | POA: Diagnosis not present

## 2022-09-12 DIAGNOSIS — H9319 Tinnitus, unspecified ear: Secondary | ICD-10-CM | POA: Diagnosis not present

## 2022-09-12 DIAGNOSIS — R200 Anesthesia of skin: Secondary | ICD-10-CM | POA: Diagnosis not present

## 2022-09-12 DIAGNOSIS — R251 Tremor, unspecified: Secondary | ICD-10-CM | POA: Diagnosis not present

## 2022-09-12 DIAGNOSIS — R29818 Other symptoms and signs involving the nervous system: Secondary | ICD-10-CM | POA: Diagnosis not present

## 2022-09-12 DIAGNOSIS — R202 Paresthesia of skin: Secondary | ICD-10-CM | POA: Diagnosis not present

## 2022-09-13 ENCOUNTER — Telehealth: Payer: Self-pay

## 2022-09-13 NOTE — Telephone Encounter (Signed)
Patient left nurse voicemail if you could increase or prescribe new oral medication for yeast.

## 2022-09-14 ENCOUNTER — Ambulatory Visit: Payer: PPO | Admitting: Cardiology

## 2022-09-14 MED ORDER — NYSTATIN 100000 UNIT/ML MT SUSP: 5.0000 mL | OROMUCOSAL | 0 refills | Status: DC

## 2022-09-14 MED ORDER — FLUCONAZOLE 200 MG PO TABS: 200.0000 mg | ORAL_TABLET | Freq: Every day | ORAL | 0 refills | Status: DC

## 2022-09-14 NOTE — Telephone Encounter (Signed)
Patient advised of information per Dr. Nicole Kindred and RX sent in .aw

## 2022-09-14 NOTE — Progress Notes (Deleted)
  Cardiology Office Note:   Date:  09/14/2022  ID:  YARITZI HAMMER, DOB [birth year], MRN [id number]  History of Present Illness:   Lindsay Tran is a 77 y.o. female ***  ROS: ***  Studies Reviewed:    EKG:  ***  ***  Risk Assessment/Calculations:   {Does this patient have ATRIAL FIBRILLATION?:[phone]} No BP recorded.  {Refresh Note OR Click here to enter BP  :1}***        Physical Exam:   VS:  There were no vitals taken for this visit.   Wt Readings from Last 3 Encounters:  06/26/22 141 lb 9.6 oz (64.2 kg)  05/12/22 139 lb 9.6 oz (63.3 kg)  03/31/22 139 lb (63 kg)     GEN: Well nourished, well developed in no acute distress NECK: No JVD; No carotid bruits CARDIAC: ***RRR, no murmurs, rubs, gallops RESPIRATORY:  Clear to auscultation without rales, wheezing or rhonchi  ABDOMEN: Soft, non-tender, non-distended EXTREMITIES:  No edema; No deformity   ASSESSMENT AND PLAN:   ***    {Are you ordering a CV Procedure (e.g. stress test, cath, DCCV, TEE, etc)?   Press F2        :[id number]   Signed, Arie Gable, NP

## 2022-09-26 ENCOUNTER — Other Ambulatory Visit: Payer: PPO

## 2022-09-27 ENCOUNTER — Ambulatory Visit (INDEPENDENT_AMBULATORY_CARE_PROVIDER_SITE_OTHER): Payer: PPO | Admitting: Family

## 2022-09-27 ENCOUNTER — Encounter: Payer: Self-pay | Admitting: Family

## 2022-09-27 VITALS — BP 136/80 | HR 92 | Temp 97.6°F | Ht 63.0 in | Wt 141.2 lb

## 2022-09-27 DIAGNOSIS — R682 Dry mouth, unspecified: Secondary | ICD-10-CM | POA: Insufficient documentation

## 2022-09-27 DIAGNOSIS — J029 Acute pharyngitis, unspecified: Secondary | ICD-10-CM

## 2022-09-27 DIAGNOSIS — E785 Hyperlipidemia, unspecified: Secondary | ICD-10-CM

## 2022-09-27 LAB — HEPATIC FUNCTION PANEL
ALT: 9 U/L (ref 0–35)
AST: 17 U/L (ref 0–37)
Albumin: 4 g/dL (ref 3.5–5.2)
Alkaline Phosphatase: 64 U/L (ref 39–117)
Bilirubin, Direct: 0.1 mg/dL (ref 0.0–0.3)
Total Bilirubin: 0.6 mg/dL (ref 0.2–1.2)
Total Protein: 6.6 g/dL (ref 6.0–8.3)

## 2022-09-27 LAB — LDL CHOLESTEROL, DIRECT: Direct LDL: 134 mg/dL

## 2022-09-27 MED ORDER — FAMOTIDINE 20 MG PO TABS: 20.0000 mg | ORAL_TABLET | Freq: Every day | ORAL | 1 refills | Status: DC

## 2022-09-27 NOTE — Progress Notes (Signed)
Limits okay sore throat may  Assessment & Plan:  Dry mouth Assessment & Plan: Reassuring HEENT exam without evidence of thrush on exam.  Question if dry mouth is aggravating.  she had tried over-the-counter Biotene which was not helpful.  She is not on medication that I am aware would contribute to dry mouth.  Of note, Cosentyx is a/w with candidiasis and less than 1%; I didn't see a/w dry mouth.    As Diflucan and clotrimazole troches were ineffective, I have advised patient in regards to bitter taste in her mouth this may be related to acid reflux and advised her to start Pepcid AC 20 mg nightly.  I have placed referral to ENT for evaluation.   Orders: -     POCT rapid strep A -     Famotidine; Take 1 tablet (20 mg total) by mouth at bedtime.  Dispense: 30 tablet; Refill: 1 -     Ambulatory referral to ENT  Sore throat -     POCT rapid strep A  Hyperlipidemia, unspecified hyperlipidemia type -     LDL cholesterol, direct -     Hepatic function panel     Return precautions given.   Risks, benefits, and alternatives of the medications and treatment plan prescribed today were discussed, and patient expressed understanding.   Education regarding symptom management and diagnosis given to patient on AVS either electronically or printed.  No follow-ups on file.  Mable Paris, FNP  Subjective:    Patient ID: Lindsay Tran, female    DOB: [birth year], 77 y.o.   MRN: [id number]  CC: Lindsay Tran is a 77 y.o. female who presents today for an acute visit.    HPI: She complains of 'film' in her mouth 'all the time' for 2-3 months.  She has bitter taste in her mouth. Worse in the afternoon.   Denies dry eyes  Chronic dysphagia which is unchanged.  No weight loss. She eats slow.   She was told cosentryx.   She started diflucan with some relief after one week; symptoms recurred 2-3 days later; clotrimazole troche without relief. She tried biotene lozenge which made symptom worse.    She is using flonase for PND.   No fever, sinus congestion, cough, epigastric burning, coughing.   She is not on antihistamine, inhalers.   No dentures.     She was seen dermatology 08/28/22 and treated by thrush by Dr Nicole Kindred with clotrimazole.   She is compliant with protonix 40mg   Upper EGD Dr Haig Prophet 08/31/21 without endoscopic abnormality to explain dysphagia.  Tortuous esophagus.  it was decided however to proceed with dilatation and lower third esophagus.  Pathology with no significant intestinal metaplasia.  H. pylori negative No ckd  History of prior Niesen fundoplication History of abdominal hysterectomy, cholecystectomy Allergies: Gluten meal Current Outpatient Medications on File Prior to Visit  Medication Sig Dispense Refill   amLODipine (NORVASC) 2.5 MG tablet Take 1 tablet (2.5 mg total) by mouth daily. 90 tablet 3   busPIRone (BUSPAR) 7.5 MG tablet Take 15 mg by mouth 2 (two) times daily.     clobetasol ointment (TEMOVATE) [id number] % Apply 1 application topically 2 (two) times daily as needed. For rash. Avoid face. 180 g 1   clotrimazole (MYCELEX) 10 MG troche Take 1 po TID for burning and film of the mouth for oral thrush. 90 Troche 3   Cyanocobalamin (VITAMIN B 12 PO) Take by mouth daily.     estradiol (ESTRACE) 0.1 MG/GM  vaginal cream 1 applicatorful twice weekly 42.5 g 3   ezetimibe (ZETIA) 10 MG tablet Take 1 tablet (10 mg total) by mouth daily. 90 tablet 3   fluticasone (FLONASE) 50 MCG/ACT nasal spray Place 1 spray into both nostrils daily.     gabapentin (NEURONTIN) 100 MG capsule Take 300 mg by mouth 2 (two) times daily.     ketoconazole (NIZORAL) 2 % cream Apply under breasts once daily, twice daily with flares. 60 g 3   magnesium oxide (MAG-OX) 400 MG tablet Take 1 tablet by mouth daily.     metoprolol tartrate (LOPRESSOR) 100 MG tablet Take 100mg  (1 tablet)  in the morning and 50mg  (0.5 tablet)  in the evening. 135 tablet 3   Multiple Vitamins-Minerals  (PRESERVISION AREDS 2 PO) Take by mouth daily.     nystatin (MYCOSTATIN) 100000 UNIT/ML suspension Take 5 mLs (500,000 Units total) by mouth as directed. Swish 5 ml as long as possible and spit out 3-4 times daily 60 mL 0   Omega-3 Fatty Acids (OMEGA 3 500 PO) Take 1 capsule by mouth daily.      pantoprazole (PROTONIX) 40 MG tablet Take 1 tablet (40 mg total) by mouth daily. 90 tablet 3   Secukinumab, 300 MG Dose, (COSENTYX SENSOREADY, 300 MG,) 150 MG/ML SOAJ Inject 300 mg into the skin as directed. On week 0, 1, 2, 3 and 4. 10 mL 0   triamcinolone ointment (KENALOG) 0.1 % Apply topically 2 (two) times daily. Use until rash improved. Avoid face, groin, underarms. 240 g 0   valACYclovir (VALTREX) 500 MG tablet Take 1 tablet (500 mg total) by mouth 2 (two) times daily. 60 tablet 2   No current facility-administered medications on file prior to visit.    Review of Systems  Constitutional:  Negative for chills and fever.  HENT:  Positive for trouble swallowing (chronic, unchanged). Negative for congestion.   Respiratory:  Negative for cough.   Cardiovascular:  Negative for chest pain and palpitations.  Gastrointestinal:  Negative for abdominal pain, nausea and vomiting.      Objective:    BP 136/80   Pulse 92   Temp 97.6 F (36.4 C) (Oral)   Ht 5\' 3"  (1.6 m)   Wt 141 lb 3.2 oz (64 kg)   SpO2 96%   BMI 25.01 kg/m   BP Readings from Last 3 Encounters:  09/27/22 136/80  08/28/22 134/72  06/26/22 120/70   Wt Readings from Last 3 Encounters:  09/27/22 141 lb 3.2 oz (64 kg)  06/26/22 141 lb 9.6 oz (64.2 kg)  05/12/22 139 lb 9.6 oz (63.3 kg)    Physical Exam Vitals reviewed.  Constitutional:      Appearance: She is well-developed.  HENT:     Head: Normocephalic and atraumatic.     Right Ear: Hearing, tympanic membrane, ear canal and external ear normal. No decreased hearing noted. No drainage, swelling or tenderness. No middle ear effusion. No foreign body. Tympanic membrane is  not erythematous or bulging.     Left Ear: Hearing, tympanic membrane, ear canal and external ear normal. No decreased hearing noted. No drainage, swelling or tenderness.  No middle ear effusion. No foreign body. Tympanic membrane is not erythematous or bulging.     Nose: Nose normal. No rhinorrhea.     Right Sinus: No maxillary sinus tenderness or frontal sinus tenderness.     Left Sinus: No maxillary sinus tenderness or frontal sinus tenderness.     Mouth/Throat:  Pharynx: Uvula midline. No oropharyngeal exudate or posterior oropharyngeal erythema.     Tonsils: No tonsillar abscesses.  Eyes:     Conjunctiva/sclera: Conjunctivae normal.  Cardiovascular:     Rate and Rhythm: Normal rate and regular rhythm.     Pulses: Normal pulses.     Heart sounds: Normal heart sounds.  Pulmonary:     Effort: Pulmonary effort is normal.     Breath sounds: Normal breath sounds. No wheezing, rhonchi or rales.  Lymphadenopathy:     Head:     Right side of head: No submental, submandibular, tonsillar, preauricular, posterior auricular or occipital adenopathy.     Left side of head: No submental, submandibular, tonsillar, preauricular, posterior auricular or occipital adenopathy.     Cervical: No cervical adenopathy.  Skin:    General: Skin is warm and dry.  Neurological:     Mental Status: She is alert.  Psychiatric:        Speech: Speech normal.        Behavior: Behavior normal.        Thought Content: Thought content normal.

## 2022-09-27 NOTE — Patient Instructions (Signed)
I suspect this is be related to dry mouth however I would not recommend Biotene again as this upset your stomach.  I think perhaps the bitter taste could be related to acid reflux and I have called in Pepcid [facility] for you to start in the evenings for couple of weeks to see if this improves symptoms.  Have also placed a referral to [facility].  Let us know if you dont hear back within a week in regards to an appointment being scheduled.   So that you are aware, if you are [facility] MyChart user , please pay attention to your MyChart messages as you may receive a MyChart message with a phone number to call and schedule this test/appointment own your own from our referral coordinator. This is a new process so I do not want you to miss this message.  If you are not a MyChart user, you will receive a phone call.

## 2022-09-28 ENCOUNTER — Other Ambulatory Visit: Payer: Self-pay | Admitting: Family Medicine

## 2022-09-28 DIAGNOSIS — E785 Hyperlipidemia, unspecified: Secondary | ICD-10-CM

## 2022-09-28 DIAGNOSIS — I2510 Atherosclerotic heart disease of native coronary artery without angina pectoris: Secondary | ICD-10-CM

## 2022-09-28 MED ORDER — REPATHA SURECLICK 140 MG/ML ~~LOC~~ SOAJ: 140.0000 mg | SUBCUTANEOUS | 2 refills | Status: DC

## 2022-09-29 LAB — POCT RAPID STREP A (OFFICE): Rapid Strep A Screen: NEGATIVE

## 2022-09-29 NOTE — Assessment & Plan Note (Addendum)
Reassuring HEENT exam without evidence of thrush on exam.  Question if dry mouth is aggravating.  she had tried over-the-counter Biotene which was not helpful.  She is not on medication that I am aware would contribute to dry mouth.  Of note, Cosentyx is a/w with candidiasis and less than 1%; I didn't see a/w dry mouth.    As Diflucan and clotrimazole troches were ineffective, I have advised patient in regards to bitter taste in her mouth this may be related to acid reflux and advised her to start Pepcid AC 20 mg nightly.  I have placed referral to ENT for evaluation.

## 2022-10-11 ENCOUNTER — Telehealth: Payer: Self-pay | Admitting: Pharmacy Technician

## 2022-10-11 NOTE — Telephone Encounter (Signed)
Patient Advocate Encounter   Received notification that prior authorization for Repatha SureClick 140MG /ML auto-injectors is required.   PA submitted on 10/11/2022 [place] [employer] Electronic Prior Authorization Form Status is pending       Lyndel Safe, [place] Patient Advocate Specialist [place] Patient Advocate Team Direct Number: [phone]  Fax: [phone]

## 2022-10-12 NOTE — Telephone Encounter (Signed)
Received request for additional information related to attempted statin therapy. Pt has only tried simvastatin, other statins along with ezetimibe are usually required before Repatha according to the additional info form. Please see below:

## 2022-10-16 ENCOUNTER — Encounter: Payer: Self-pay | Admitting: Oncology

## 2022-10-16 NOTE — Telephone Encounter (Signed)
Please contact the patient and see if you can have her list off all the statins she has tried in the past and what side effects she had with them.  We will need this information to complete the PA for her Repatha.

## 2022-10-17 NOTE — Telephone Encounter (Signed)
Patient states she has been on Simvastatin which caused leg pain, Zetia which you took her off of due to it not working and Fenofibrate caused weak legs with trouble walking. Patient states she still has some Zetia if you would like for her to take that for now. Please advise

## 2022-10-18 ENCOUNTER — Encounter: Payer: Self-pay | Admitting: Cardiology

## 2022-10-18 ENCOUNTER — Ambulatory Visit: Payer: PPO | Attending: Cardiology | Admitting: Cardiology

## 2022-10-18 VITALS — BP 132/68 | HR 54 | Ht 60.0 in | Wt 144.6 lb

## 2022-10-18 DIAGNOSIS — I10 Essential (primary) hypertension: Secondary | ICD-10-CM | POA: Diagnosis not present

## 2022-10-18 DIAGNOSIS — K224 Dyskinesia of esophagus: Secondary | ICD-10-CM

## 2022-10-18 DIAGNOSIS — R002 Palpitations: Secondary | ICD-10-CM | POA: Diagnosis not present

## 2022-10-18 DIAGNOSIS — K219 Gastro-esophageal reflux disease without esophagitis: Secondary | ICD-10-CM | POA: Diagnosis not present

## 2022-10-18 DIAGNOSIS — E782 Mixed hyperlipidemia: Secondary | ICD-10-CM

## 2022-10-18 DIAGNOSIS — R011 Cardiac murmur, unspecified: Secondary | ICD-10-CM | POA: Diagnosis not present

## 2022-10-18 MED ORDER — ROSUVASTATIN CALCIUM 5 MG PO TABS: 5.0000 mg | ORAL_TABLET | Freq: Every day | ORAL | 0 refills | Status: DC

## 2022-10-18 NOTE — Progress Notes (Signed)
Cardiology Office Note:   Date:  10/18/2022  ID:  Lindsay Tran, DOB Aug 21, 1945, MRN [id number]  History of Present Illness:   Lindsay Tran is a 77 y.o. female with past medical history of coronary artery calcification, hypertension, hyperlipidemia, varicose veins and right arm pain, peptic ulcer disease, iron deficiency anemia, essential tremor, scoliosis, and psoriatic arthritis, who is here today for follow-up.  Previous echocardiogram revealed LVEF 55 to 60%, G2 DD, mild mitral regurgitation.  Heart monitor revealed predominant sinus rhythm with an average heart rate of 65 bpm.  She did have 37 supraventricular runs that were observed lasting up to 19 beats and several episodes of brief PSVT as well as rare PACs and PVCs.  She did undergo an ABI study completed 04/29/2022 the right ankle-brachial index was noncompressible in the right lower extremity the right toe brachial index was normal, left resting ankle-brachial index indicates noncompressible lower extremity arteries on the left and normal left toe brachial index.  She was last seen in clinic 05/12/2022 which is doing fairly well.  She had one bout of palpitations since her prior visit but otherwise was doing fairly well.  When her medications were added no further testing that was ordered.  She returns to clinic today stating that she has been doing fairly well.  She states her PCP taken her off simvastatin and fenofibrate that she has been on for quite some time, Zetia for cholesterol has worsened.  Zetia was subsequently discontinued due to lack of effectiveness.  They are waiting on the PA for Repatha.  Patient states that her cholesterol is worsened since being off of her medications.  She also states she was recently diagnosed with gastroesophageal reflux disease and started on additional PPI therapy.  She continues to endorse occasional palpitations primarily in the evening or at night.  Occasionally some swelling to her bilateral  lower extremities.  Is suffering from worsening esophageal spasms and is asking to increase her amlodipine dosing from 2.5 mg to 5 mg daily to see if that helps with her spasms.  She denies any recent hospitalizations or visits to the emergency department  ROS: 10 point review of systems was has been reviewed and is negative except reported HPI  Studies Reviewed:    EKG: Sinus bradycardia with a rate of 54, no acute changes from prior studies  TTE 02/08/22  1. Left ventricular ejection fraction, by estimation, is 55 to 60%. The  left ventricle has normal function. The left ventricle has no regional  wall motion abnormalities. Left ventricular diastolic parameters are  consistent with Grade II diastolic  dysfunction (pseudonormalization).   2. Right ventricular systolic function is normal. The right ventricular  size is normal.   3. Left atrial size was moderately dilated.   4. The mitral valve is normal in structure. Mild mitral valve  regurgitation.   5. The aortic valve is tricuspid. Aortic valve regurgitation is not  visualized. Aortic valve sclerosis is present, with no evidence of aortic  valve stenosis.   6. The inferior vena cava is normal in size with greater than 50%  respiratory variability, suggesting right atrial pressure of 3 mmHg.    Risk Assessment/Calculations:              Physical Exam:   VS:  BP 132/68 (BP Location: Left Arm, Patient Position: Sitting, Cuff Size: Normal)   Pulse (!) 54   Ht 5' (1.524 m)   Wt 144 lb 9.6 oz (65.6 kg)  SpO2 97%   BMI 28.24 kg/m    Wt Readings from Last 3 Encounters:  10/18/22 144 lb 9.6 oz (65.6 kg)  09/27/22 141 lb 3.2 oz (64 kg)  06/26/22 141 lb 9.6 oz (64.2 kg)     GEN: Well nourished, well developed in no acute distress NECK: No JVD; No carotid bruits CARDIAC: RRR, I/VI systolic murmur, without rubs, gallops RESPIRATORY:  Clear to auscultation without rales, wheezing or rhonchi  ABDOMEN: Soft, non-tender,  non-distended EXTREMITIES: Trace pretibial edema; No deformity   ASSESSMENT AND PLAN:   Palpitations continue to improve.  She occasionally has breakthrough episodes of tach noted primarily in the evening which stopped the rest.  She is to continued metoprolol tartrate 100 mg in the morning and 50 mg in the evening.  She requires a refill to her medication currently.   Essential hypertension with blood pressure today 132/60.  Blood pressure remained stable.  She is continued on Lopressor dosing.  She is also requesting to increase her amlodipine from 2.5 mg daily to 5 mg daily to assist with her esophageal spasms.  She has been encouraged to continue to monitor blood pressures at home 1 to 2 hours after she takes her current medications.  Mixed hyperlipidemia with LDL of 133 08/01/2022.  This is after her primary care provider discontinued her simvastatin and fenofibrate and she had also tried Zetia with no improvement in numbers.  They have tried for breath over the prior authorization request that she take more than 1 statin therapy.  She has been started on rosuvastatin 5 mg 3 days a week on Monday Wednesday and Friday.  She will have a repeat lipid and LFT in 8 weeks after starting medication to determine the effectiveness.  If she does not tolerate the medication she is to notify the office and the prior authorization for Repatha can be completed as she would have failure on multiple statins.  Heart murmur presents on physical examination.  Previous echocardiogram revealed LVEF 55 to 60%, no regional wall motion abnormalities, G2 DD, mild mitral valve regurgitation.  Will continue to monitor and repeat surveillance echoes as needed.  Gastroesophageal reflux disease with longstanding history of esophageal spasms.  She was previously placed on amlodipine 2.5 mg calcium channel blocker to help with her esophageal spasms.  She states that the symptoms have worsened and is asking to increase her  amlodipine dosing today.  She has been increased amlodipine to 5 mg daily with continued monitoring of her blood pressure at home as well.  Will also reach out to her gastro to see if hyoscyamine sublingual may be something if she can tolerate which she has extreme episodes.  Disposition patient return to clinic to see MD/APP in 3 months or sooner if needed to reevaluate symptoms.        Signed, Rayshawn Visconti, NP

## 2022-10-18 NOTE — Patient Instructions (Signed)
Medication Instructions:   Your physician has recommended you make the following change in your medication:  START Rosuvastatin ( Crestor) 5 mg tablet by mouth 3 times a week.  *If you need a refill on your cardiac medications before your next appointment, please call your pharmacy*   Lab Work:  Your physician recommends that you return for lab work in 8 wks at [facility]- LIPID/LFT's  Your physician recommends that you return for a FASTING lipid profile:   - You will need to be fasting. Please do not have anything to eat or drink after midnight the morning you have the lab work. You may only have water or black coffee with no cream or sugar.   - Please go to the [facility]. You will check in at the front desk to the right as you walk into the atrium. Valet Parking is offered if needed. - No appointment needed. You may go any day between 7 am and 6 pm.     Testing/Procedures:  No testing ordered today.   Follow-Up: At [facility], you and your health needs are our priority.  As part of our continuing mission to provide you with exceptional heart care, we have created designated Provider Care Teams.  These Care Teams include your primary Cardiologist (physician) and Advanced Practice Providers (APPs -  Physician Assistants and Nurse Practitioners) who all work together to provide you with the care you need, when you need it.  We recommend signing up for the patient portal called "MyChart".  Sign up information is provided on this After Visit Summary.  MyChart is used to connect with patients for Virtual Visits (Telemedicine).  Patients are able to view lab/test results, encounter notes, upcoming appointments, etc.  Non-urgent messages can be sent to your provider as well.   To learn more about what you can do with MyChart, go to [email or web address].    Your next appointment:   3 month(s)  Provider:   You may see Yvonne Kendall, MD or one of the  following Advanced Practice Providers on your designated Care Team:   Nicolasa Ducking, NP Eula Listen, PA-C Cadence Fransico Michael, PA-C Charlsie Quest, NP

## 2022-10-19 ENCOUNTER — Other Ambulatory Visit: Payer: Self-pay | Admitting: Family

## 2022-10-19 ENCOUNTER — Other Ambulatory Visit: Payer: Self-pay | Admitting: Dermatology

## 2022-10-19 DIAGNOSIS — R682 Dry mouth, unspecified: Secondary | ICD-10-CM

## 2022-10-19 NOTE — Telephone Encounter (Signed)
Noted  

## 2022-10-23 DIAGNOSIS — R200 Anesthesia of skin: Secondary | ICD-10-CM | POA: Diagnosis not present

## 2022-10-23 DIAGNOSIS — R29898 Other symptoms and signs involving the musculoskeletal system: Secondary | ICD-10-CM | POA: Diagnosis not present

## 2022-10-23 DIAGNOSIS — R202 Paresthesia of skin: Secondary | ICD-10-CM | POA: Diagnosis not present

## 2022-10-24 ENCOUNTER — Telehealth: Payer: Self-pay

## 2022-10-24 ENCOUNTER — Other Ambulatory Visit: Payer: Self-pay | Admitting: Dermatology

## 2022-10-24 NOTE — Telephone Encounter (Signed)
Patient called and asked for RF of medications since her Cosentyx being due again. This was your recommendations last time:   "Can send in Fluconazole 200 mg PO qd x 7 days. No rfs. Side effects of fluconazole (diflucan) include nausea, diarrhea, headache, dizziness, taste changes, rare risk of irritation of the liver, allergy, or decreased blood counts (which could show up as infection or tiredness).  Can also send in Nystatin oral rinse to use after finishing oral fluconazole. Swish 5 ml as long as possible and spit out 3-4 times daily."  She is not scheduled to see you until August but the Cosentyx is causing thrush issues.

## 2022-10-24 NOTE — Telephone Encounter (Signed)
LMTCB

## 2022-10-24 NOTE — Telephone Encounter (Signed)
Left message to call the office back to let her know Insurance did Approve the Repatha from 10/19/22 through 04/20/23.

## 2022-10-24 NOTE — Telephone Encounter (Signed)
Someone approved RF request today. aw

## 2022-10-25 ENCOUNTER — Telehealth: Payer: Self-pay | Admitting: Internal Medicine

## 2022-10-25 NOTE — Telephone Encounter (Signed)
Called Pharmacy to let them know to fill the Repatha due to Insurance approving it

## 2022-10-25 NOTE — Telephone Encounter (Signed)
Patient calling in to ask about hyoscyamine script which she and Charlsie Quest, NP discussed at last visit on 10/18/22. Patient states Lindsay Tran was going to reach out to patient's GI MD to discuss appropriateness of this drug. Patient asking for update as she has not heard from her GI MD. Forwarded to Charlsie Quest, NP.

## 2022-10-25 NOTE — Telephone Encounter (Signed)
Patient states she is returning our call.  I read message from Prince Solian, CMA, to patient.  Patient states the last time she spoke with her pharmacy ([facility] [address]), they said it was not approved, so she would like for [place] to let them know that it has been approved.

## 2022-10-25 NOTE — Telephone Encounter (Signed)
Patient calling in to get the medication the NP told her she could take, when she came in for her appt. Please advise

## 2022-10-25 NOTE — Telephone Encounter (Signed)
Spoke to Patient to let her know that her Repatha was approved and [facility] is aware and filling the prescription for her.

## 2022-10-25 NOTE — Telephone Encounter (Signed)
Patient advised. aw 

## 2022-10-26 NOTE — Telephone Encounter (Signed)
Will follow up and reach out to GI.

## 2022-11-02 DIAGNOSIS — R251 Tremor, unspecified: Secondary | ICD-10-CM | POA: Diagnosis not present

## 2022-11-02 DIAGNOSIS — G629 Polyneuropathy, unspecified: Secondary | ICD-10-CM | POA: Diagnosis not present

## 2022-11-02 DIAGNOSIS — R202 Paresthesia of skin: Secondary | ICD-10-CM | POA: Diagnosis not present

## 2022-11-02 DIAGNOSIS — R29818 Other symptoms and signs involving the nervous system: Secondary | ICD-10-CM | POA: Diagnosis not present

## 2022-11-02 DIAGNOSIS — R200 Anesthesia of skin: Secondary | ICD-10-CM | POA: Diagnosis not present

## 2022-11-21 DIAGNOSIS — K224 Dyskinesia of esophagus: Secondary | ICD-10-CM | POA: Diagnosis not present

## 2022-11-21 DIAGNOSIS — R1310 Dysphagia, unspecified: Secondary | ICD-10-CM | POA: Diagnosis not present

## 2022-11-21 DIAGNOSIS — K219 Gastro-esophageal reflux disease without esophagitis: Secondary | ICD-10-CM | POA: Diagnosis not present

## 2022-11-28 DIAGNOSIS — K219 Gastro-esophageal reflux disease without esophagitis: Secondary | ICD-10-CM | POA: Diagnosis not present

## 2022-11-28 DIAGNOSIS — M170 Bilateral primary osteoarthritis of knee: Secondary | ICD-10-CM | POA: Diagnosis not present

## 2022-11-28 DIAGNOSIS — R1310 Dysphagia, unspecified: Secondary | ICD-10-CM | POA: Diagnosis not present

## 2022-12-06 IMAGING — RF DG ESOPHAGUS
8 series · 14 of 24 positions shown · non-contrast
Comparison: NONE.

CLINICAL DATA: Patient with worsening dysphagia with solids,
previous history of hiatal hernia repair (approximately 5 years ago
per patient) and several EGDs with esophageal stretching who
presents today for esophagram.

EXAM:
ESOPHAGUS/BARIUM SWALLOW/TABLET STUDY
TECHNIQUE: Combined double and single contrast examination was performed using
effervescent crystals, high-density barium, and thin liquid barium.
This exam was performed by Mance, Johansen, and was
supervised and interpreted by Yung, Jazmyn.
FLUOROSCOPY:
Radiation Exposure Index (as provided by the fluoroscopic device):
54.20 mGy Kerma

[Series 1: cp_standard · 0.17mm/px · 2 of 94 frames shown (1 of 8)]
[frame 15/94]
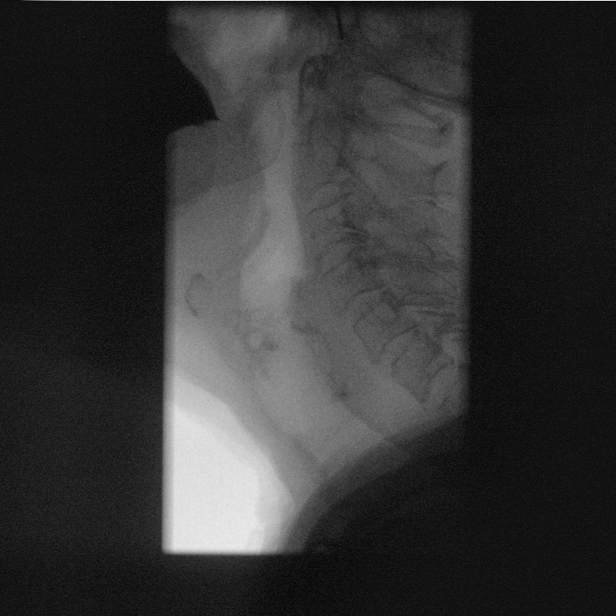
[frame 48/94]
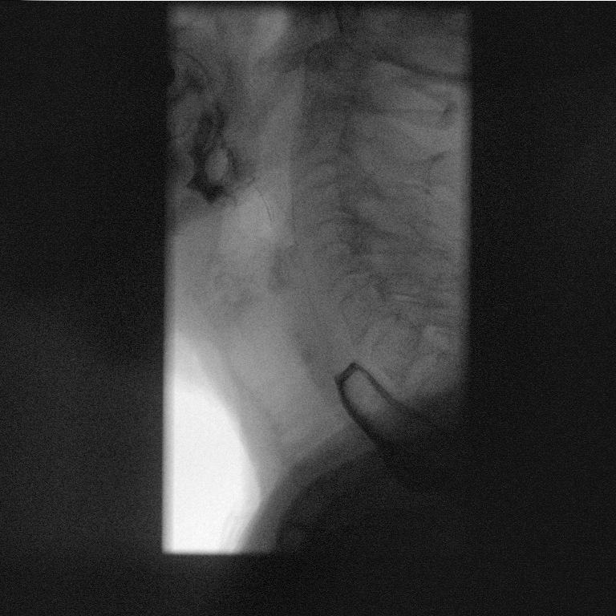

[Series 2: cp_standard · 0.17mm/px · 2 of 87 frames shown (2 of 8)]
[frame 14/87]
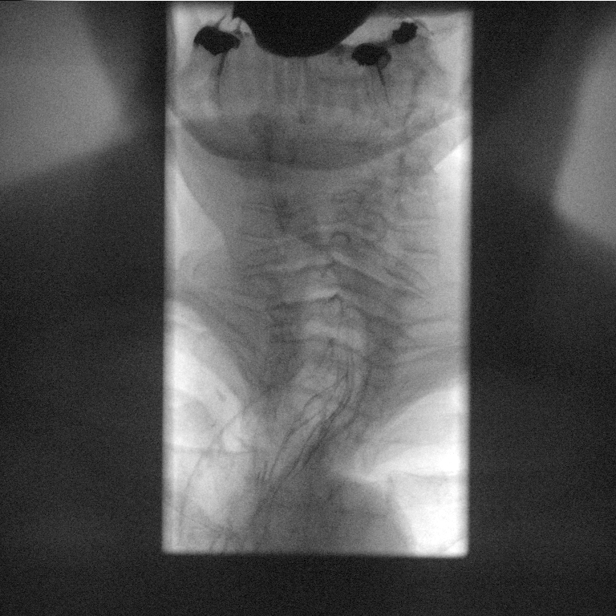
[frame 84/87]
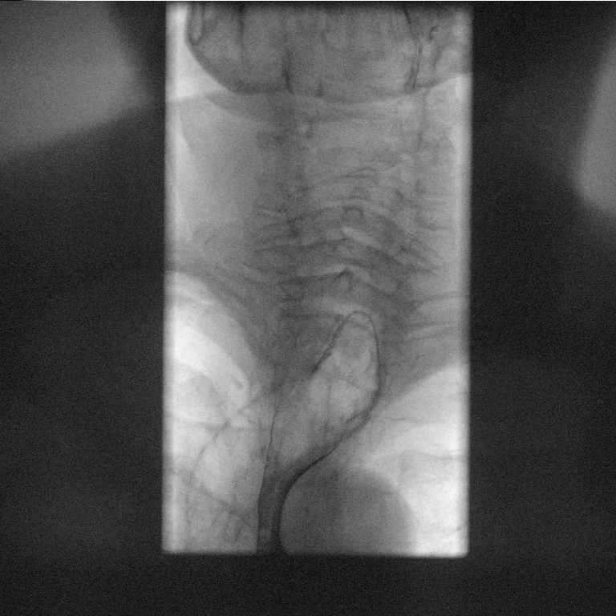

[Series 3: cp_standard · 0.17mm/px · 2 of 316 frames shown (3 of 8)]
[frame 48/316]
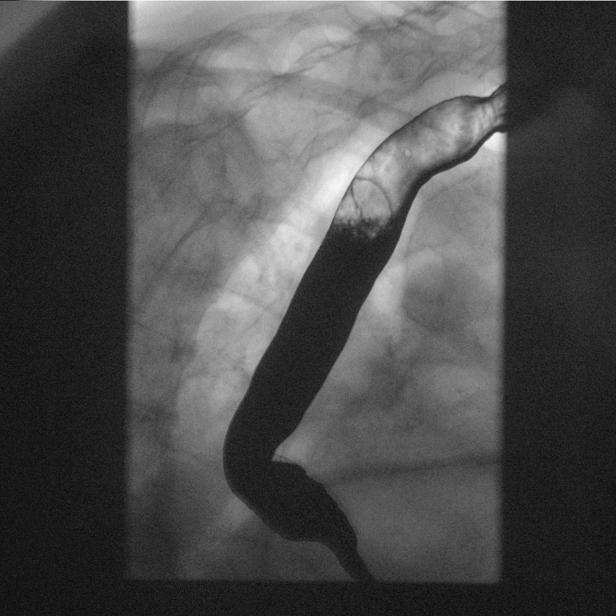
[frame 159/316]
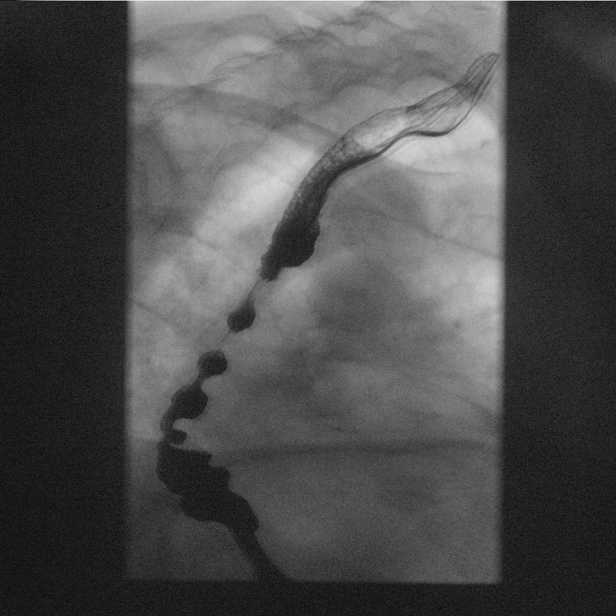

[Series 4: cp_standard · 0.17mm/px · 1 of 1 slices shown (4 of 8)]
[im 1/1]
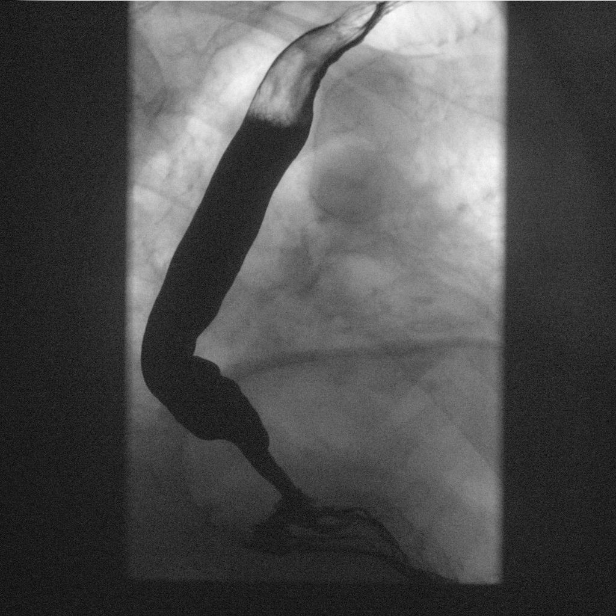

[Series 5: cp_standard · 0.18mm/px · 2 of 137 frames shown (5 of 8)]
[frame 21/137]
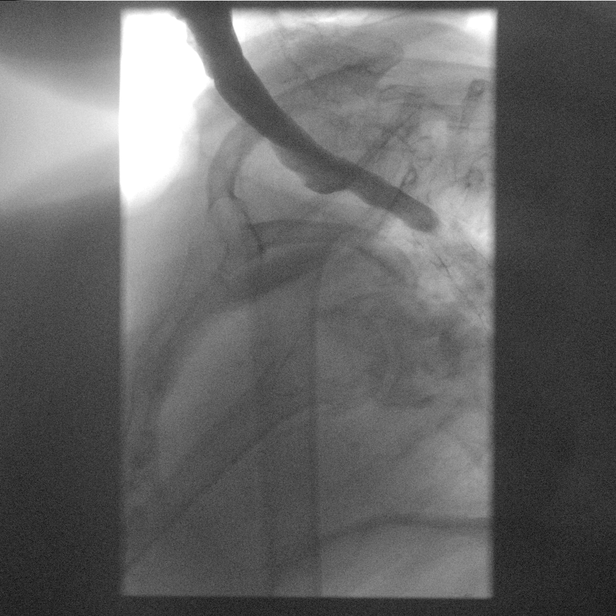
[frame 117/137]
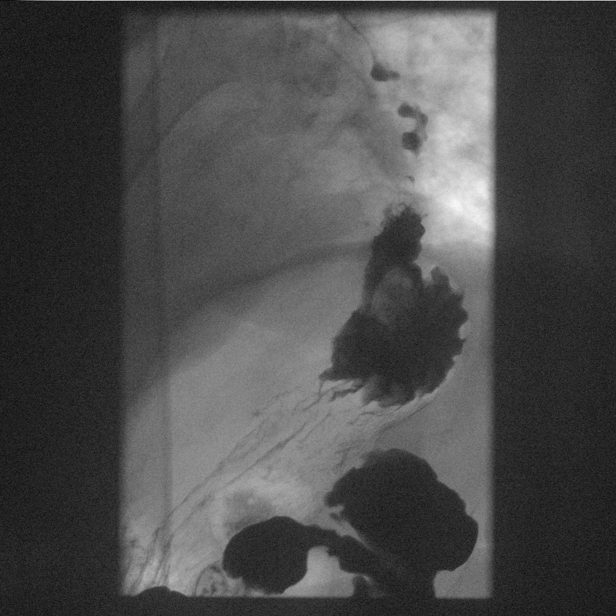

[Series 6: cp_standard · 0.18mm/px · 2 of 101 frames shown (6 of 8)]
[frame 16/101]
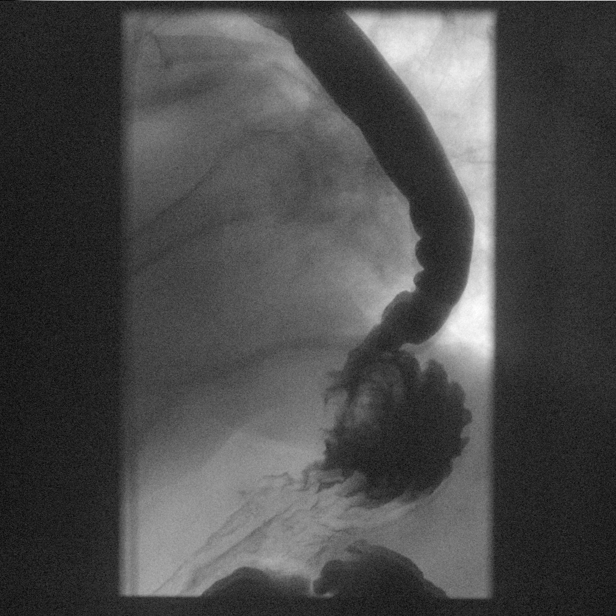
[frame 90/101]
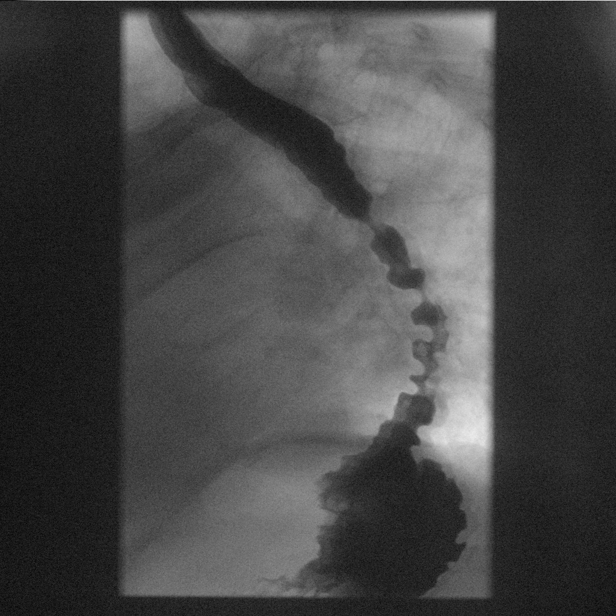

[Series 7: cp_standard · 0.17mm/px · 2 of 109 frames shown (7 of 8)]
[frame 17/109]
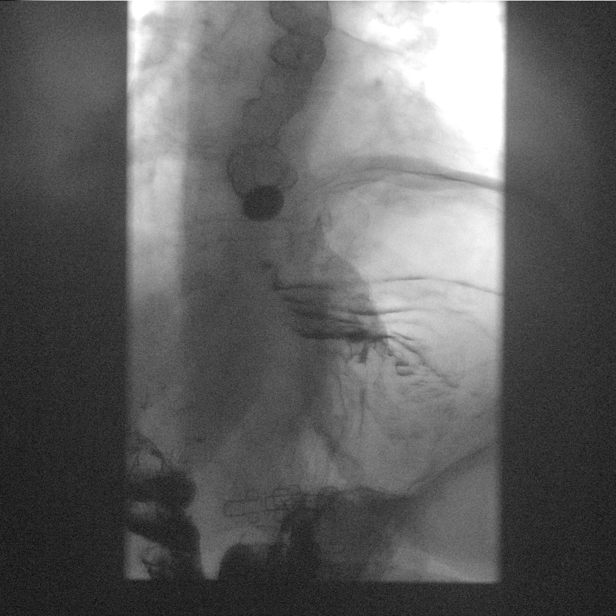
[frame 55/109]
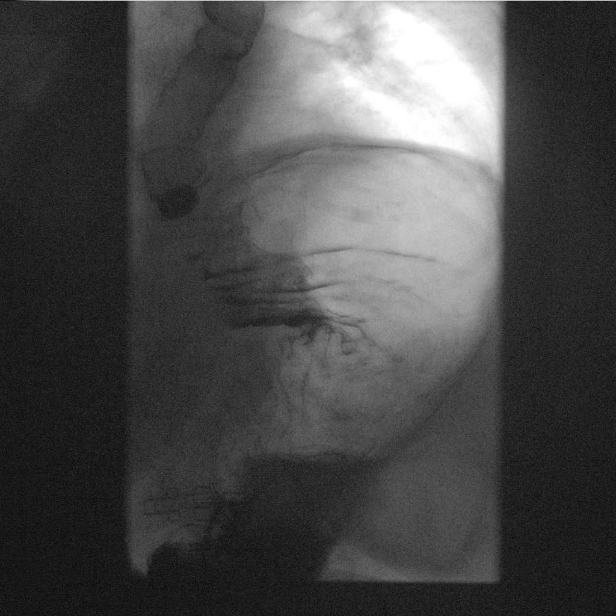

[Series 8: cp_standard · 0.17mm/px · 1 of 1 slices shown (8 of 8)]
[im 1/1]
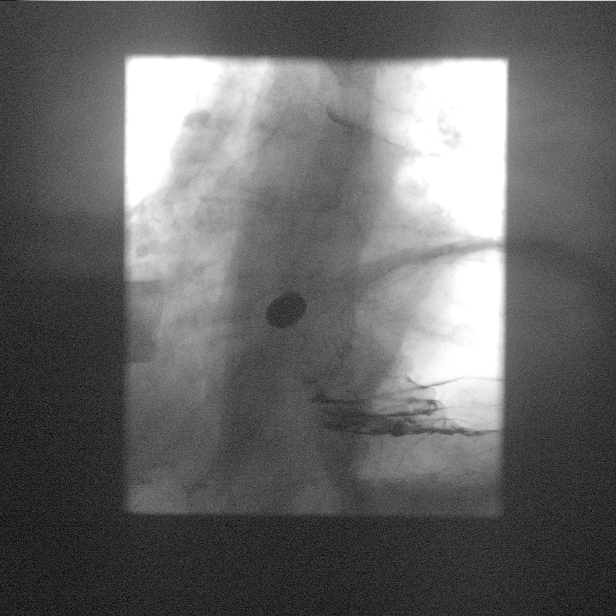

[14 of 24 positions shown; findings below may reference images not displayed]

FINDINGS: Swallowing: Appears normal. No vestibular penetration or aspiration
seen.

Pharynx: Unremarkable.

Esophagus: Tortuous due to scoliosis. No mass. There is relative
narrowing at the gastroesophageal junction.

Esophageal motility: Significant dysmotility, particularly in the
lower esophagus.

Hiatal Hernia: None

Gastroesophageal reflux: None visualized. Unable to adequately
assess as patient unable to tolerate prone positioning.

Ingested 13 mm barium tablet: Became stuck at the GE junction -
patient given water and thick barium while pill was observed under
intermittent fluoroscopy for 5 minutes without successful passage
into the stomach.

Other: None.
IMPRESSION: Narrowing at the gastroesophageal junction. 13 mm tablet unable to
pass after 5 minutes of observation.

Lower esophageal dysmotility.

## 2022-12-11 DIAGNOSIS — M7541 Impingement syndrome of right shoulder: Secondary | ICD-10-CM | POA: Diagnosis not present

## 2022-12-11 DIAGNOSIS — M25472 Effusion, left ankle: Secondary | ICD-10-CM | POA: Diagnosis not present

## 2022-12-11 DIAGNOSIS — M778 Other enthesopathies, not elsewhere classified: Secondary | ICD-10-CM | POA: Diagnosis not present

## 2022-12-11 DIAGNOSIS — M19072 Primary osteoarthritis, left ankle and foot: Secondary | ICD-10-CM | POA: Diagnosis not present

## 2022-12-11 DIAGNOSIS — G8929 Other chronic pain: Secondary | ICD-10-CM | POA: Diagnosis not present

## 2022-12-11 DIAGNOSIS — M7672 Peroneal tendinitis, left leg: Secondary | ICD-10-CM | POA: Diagnosis not present

## 2022-12-13 ENCOUNTER — Other Ambulatory Visit
Admission: RE | Admit: 2022-12-13 | Discharge: 2022-12-13 | Disposition: A | Discharge status: Home / Self Care | Payer: PPO | Source: Ambulatory Visit | Attending: Cardiology | Admitting: Cardiology

## 2022-12-13 DIAGNOSIS — R011 Cardiac murmur, unspecified: Secondary | ICD-10-CM | POA: Insufficient documentation

## 2022-12-13 DIAGNOSIS — R002 Palpitations: Secondary | ICD-10-CM | POA: Diagnosis not present

## 2022-12-13 DIAGNOSIS — I10 Essential (primary) hypertension: Secondary | ICD-10-CM | POA: Diagnosis not present

## 2022-12-13 LAB — HEPATIC FUNCTION PANEL
ALT: 16 U/L (ref 0–44)
AST: 26 U/L (ref 15–41)
Albumin: 4 g/dL (ref 3.5–5.0)
Alkaline Phosphatase: 59 U/L (ref 38–126)
Bilirubin, Direct: <0.1 mg/dL (ref 0.0–0.2)
Total Bilirubin: 0.9 mg/dL (ref 0.3–1.2)
Total Protein: 6.6 g/dL (ref 6.5–8.1)

## 2022-12-13 LAB — LIPID PANEL
Cholesterol: 111 mg/dL (ref 0–200)
HDL: 62 mg/dL (ref >40)
LDL Cholesterol: 13 mg/dL (ref 0–99)
Total CHOL/HDL Ratio: 1.8 RATIO
Triglycerides: 181 mg/dL — ABNORMAL HIGH (ref <150)
VLDL: 36 mg/dL (ref 0–40)

## 2022-12-14 DIAGNOSIS — M170 Bilateral primary osteoarthritis of knee: Secondary | ICD-10-CM | POA: Diagnosis not present

## 2022-12-14 NOTE — Progress Notes (Signed)
Huge improvement in total and bad cholesterol. Continue current medication regimen without changes.

## 2022-12-20 DIAGNOSIS — M170 Bilateral primary osteoarthritis of knee: Secondary | ICD-10-CM | POA: Diagnosis not present

## 2022-12-26 ENCOUNTER — Ambulatory Visit: Payer: PPO | Admitting: Family Medicine

## 2022-12-26 DIAGNOSIS — M170 Bilateral primary osteoarthritis of knee: Secondary | ICD-10-CM | POA: Diagnosis not present

## 2023-01-09 ENCOUNTER — Other Ambulatory Visit: Payer: Self-pay | Admitting: Family Medicine

## 2023-01-12 ENCOUNTER — Ambulatory Visit (INDEPENDENT_AMBULATORY_CARE_PROVIDER_SITE_OTHER): Payer: PPO | Admitting: Family Medicine

## 2023-01-12 ENCOUNTER — Encounter: Payer: Self-pay | Admitting: Family Medicine

## 2023-01-12 VITALS — BP 112/72 | HR 65 | Temp 97.8°F | Ht 60.0 in | Wt 138.8 lb

## 2023-01-12 DIAGNOSIS — L409 Psoriasis, unspecified: Secondary | ICD-10-CM

## 2023-01-12 DIAGNOSIS — B370 Candidal stomatitis: Secondary | ICD-10-CM | POA: Diagnosis not present

## 2023-01-12 DIAGNOSIS — I10 Essential (primary) hypertension: Secondary | ICD-10-CM

## 2023-01-12 DIAGNOSIS — K219 Gastro-esophageal reflux disease without esophagitis: Secondary | ICD-10-CM

## 2023-01-12 DIAGNOSIS — E785 Hyperlipidemia, unspecified: Secondary | ICD-10-CM

## 2023-01-12 DIAGNOSIS — R682 Dry mouth, unspecified: Secondary | ICD-10-CM

## 2023-01-12 MED ORDER — FAMOTIDINE 20 MG PO TABS: ORAL_TABLET | ORAL | 1 refills | Status: DC

## 2023-01-12 MED ORDER — FLUCONAZOLE 50 MG PO TABS
ORAL_TABLET | ORAL | 0 refills | Status: AC
Start: 2023-01-12 — End: 2023-01-19

## 2023-01-12 NOTE — Assessment & Plan Note (Signed)
Chronic issue.  Stable.  Continue Crestor 5 mg daily and Repatha 140 mg every 14 days.

## 2023-01-12 NOTE — Assessment & Plan Note (Signed)
Chronic issue.  Well-controlled.  She will continue amlodipine 2.5 mg daily and metoprolol 100 mg in the morning and 50 mg in the evening.

## 2023-01-12 NOTE — Patient Instructions (Signed)
Nice to see you. Please try the Diflucan to help with your thrush.  If this is not beneficial please let me know.

## 2023-01-12 NOTE — Assessment & Plan Note (Addendum)
Chronic issue.  She will continue Protonix 40 mg twice daily and Pepcid.  She will see her specialist as planned.

## 2023-01-12 NOTE — Progress Notes (Signed)
Marikay Alar, MD Phone: [phone]  Lindsay Tran is a 77 y.o. female who presents today for f/u.  HYPERTENSION Disease Monitoring Home BP Monitoring not checking Chest pain- no    Dyspnea- no Medications Compliance-  taking amlodipine, metoprolol.  Edema- no BMET    Component Value Date/Time   NA 140 08/01/2022 1003   NA 143 04/19/2022 0930   K 4.0 08/01/2022 1003   CL 102 08/01/2022 1003   CO2 30 08/01/2022 1003   GLUCOSE 99 08/01/2022 1003   BUN 20 08/01/2022 1003   BUN 9 04/19/2022 0930   CREATININE 0.67 08/01/2022 1003   CALCIUM 9.1 08/01/2022 1003   HYPERLIPIDEMIA Symptoms Chest pain on exertion:  no   Medications: Compliance- taking crestor Right upper quadrant pain- no  Muscle aches- no Lipid Panel     Component Value Date/Time   CHOL 111 12/13/2022 1349   TRIG 181 (H) 12/13/2022 1349   HDL 62 12/13/2022 1349   CHOLHDL 1.8 12/13/2022 1349   VLDL 36 12/13/2022 1349   LDLCALC 13 12/13/2022 1349   LDLDIRECT 134.0 09/27/2022 1042   GERD:   Reflux symptoms: yes, comes up to her throat   Abd pain: no change to chronic issues   Blood in stool: no    EGD: 11/28/21  Medication: protonix BID and pepcid Notes she has an upcoming appointment with a specialist at [facility] for this issue.   Thrush: Patient she was diagnosed with this in January or February by her dermatologist.  They ended up stopping her Cosentyx because of this.  She reports a trial of clotrimazole atrocious and nystatin.  Neither of these were helpful.  She gets a crystaly film in her mouth that makes sores.  She notes the other day her tongue was completely white.  She saw one of our FNP's in March and they did not see anything concerning.  They referred her to ENT though the patient notes she never saw them as she had a $14 bill from 10 years ago that had to be paid before they would schedule her.   Social History   Tobacco Use  Smoking Status Never  Smokeless Tobacco Never    Current  Outpatient Medications on File Prior to Visit  Medication Sig Dispense Refill   amLODipine (NORVASC) 2.5 MG tablet Take 1 tablet (2.5 mg total) by mouth daily. 90 tablet 3   busPIRone (BUSPAR) 7.5 MG tablet Take 15 mg by mouth 2 (two) times daily.     clobetasol ointment (TEMOVATE) 0.05 % Apply 1 application topically 2 (two) times daily as needed. For rash. Avoid face. 180 g 1   Cyanocobalamin (VITAMIN B 12 PO) Take by mouth daily.     estradiol (ESTRACE) 0.1 MG/GM vaginal cream 1 APPLICATORFUL TWICE WEEKLY 127.5 g 1   Evolocumab (REPATHA SURECLICK) 140 MG/ML SOAJ Inject 140 mg into the skin every 14 (fourteen) days. 6 mL 2   fluticasone (FLONASE) 50 MCG/ACT nasal spray Place 1 spray into both nostrils daily.     gabapentin (NEURONTIN) 300 MG capsule Take 300 mg by mouth 2 (two) times daily.     ketoconazole (NIZORAL) 2 % cream Apply under breasts once daily, twice daily with flares. 60 g 3   magnesium oxide (MAG-OX) 400 MG tablet Take 1 tablet by mouth daily.     metoprolol tartrate (LOPRESSOR) 100 MG tablet Take 100mg  (1 tablet)  in the morning and 50mg  (0.5 tablet)  in the evening. 135 tablet 3   Multiple  Vitamins-Minerals (PRESERVISION AREDS 2 PO) Take by mouth daily.     Omega-3 Fatty Acids (OMEGA 3 500 PO) Take 1 capsule by mouth daily.      pantoprazole (PROTONIX) 40 MG tablet Take 1 tablet (40 mg total) by mouth daily. 90 tablet 3   rosuvastatin (CRESTOR) 5 MG tablet Take 1 tablet (5 mg total) by mouth daily. 90 tablet 0   triamcinolone ointment (KENALOG) 0.1 % Apply topically 2 (two) times daily. Use until rash improved. Avoid face, groin, underarms. 240 g 0   valACYclovir (VALTREX) 500 MG tablet Take 1 tablet (500 mg total) by mouth 2 (two) times daily. 60 tablet 2   Secukinumab, 300 MG Dose, (COSENTYX SENSOREADY, 300 MG,) 150 MG/ML SOAJ Inject 300 mg into the skin as directed. On week 0, 1, 2, 3 and 4. (Patient not taking: Reported on 01/12/2023) 10 mL 0   No current  facility-administered medications on file prior to visit.     ROS see history of present illness  Objective  Physical Exam Vitals:   01/12/23 1144  BP: 112/72  Pulse: 65  Temp: 97.8 F (36.6 C)  SpO2: 97%    BP Readings from Last 3 Encounters:  01/12/23 112/72  10/18/22 132/68  09/27/22 136/80   Wt Readings from Last 3 Encounters:  01/12/23 138 lb 12.8 oz (63 kg)  10/18/22 144 lb 9.6 oz (65.6 kg)  09/27/22 141 lb 3.2 oz (64 kg)    Physical Exam Constitutional:      General: She is not in acute distress.    Appearance: She is not diaphoretic.  HENT:     Mouth/Throat:     Mouth: Mucous membranes are moist.     Pharynx: Oropharynx is clear. No oropharyngeal exudate or posterior oropharyngeal erythema.  Cardiovascular:     Rate and Rhythm: Normal rate and regular rhythm.     Heart sounds: Normal heart sounds.  Pulmonary:     Effort: Pulmonary effort is normal.     Breath sounds: Normal breath sounds.  Skin:    General: Skin is warm and dry.  Neurological:     Mental Status: She is alert.      Assessment/Plan: Please see individual problem list.  Hyperlipidemia, unspecified hyperlipidemia type Assessment & Plan: Chronic issue.  Stable.  Continue Crestor 5 mg daily and Repatha 140 mg every 14 days.   Essential hypertension Assessment & Plan: Chronic issue.  Well-controlled.  She will continue amlodipine 2.5 mg daily and metoprolol 100 mg in the morning and 50 mg in the evening.   Dry mouth -     Famotidine; TAKE 1 TABLET BY MOUTH EVERYDAY AT BEDTIME  Dispense: 90 tablet; Refill: 1  Thrush Assessment & Plan: The patient's described symptoms are concerning for possible thrush though she has no evidence of this on exam today.  Given recent white material on her tongue we will proceed with treatment with Diflucan 200 mg on day 1 and 100 mg on days 2 through 7.  If not beneficial she will let us know.  Discussed the potential need to see ENT or follow-up with  her dermatologist to determine if something else is causing this.  Orders: -     Fluconazole; Take 4 tablets (200 mg total) by mouth daily for 1 day, THEN 2 tablets (100 mg total) daily for 6 days.  Dispense: 16 tablet; Refill: 0  Gastroesophageal reflux disease without esophagitis Assessment & Plan: Chronic issue.  She will continue Protonix 40 mg twice  daily and Pepcid.  She will see her specialist as planned.   Psoriasis Assessment & Plan: Patient will continue to follow with dermatology.  She wondered if there is anything she can take to help with her psoriatic arthritis.  Discussed over-the-counter she can take Tylenol that she notes it is not beneficial.  Discussed that most beneficial medicine would be something like Cosentyx that she was on previously though she notes she cannot take that until her thrush is adequately treated.      Return in about 6 months (around 07/15/2023).   Marikay Alar, MD [facility]

## 2023-01-12 NOTE — Assessment & Plan Note (Signed)
Patient will continue to follow with dermatology.  She wondered if there is anything she can take to help with her psoriatic arthritis.  Discussed over-the-counter she can take Tylenol that she notes it is not beneficial.  Discussed that most beneficial medicine would be something like Cosentyx that she was on previously though she notes she cannot take that until her thrush is adequately treated.

## 2023-01-12 NOTE — Assessment & Plan Note (Signed)
The patient's described symptoms are concerning for possible thrush though she has no evidence of this on exam today.  Given recent white material on her tongue we will proceed with treatment with Diflucan 200 mg on day 1 and 100 mg on days 2 through 7.  If not beneficial she will let us know.  Discussed the potential need to see ENT or follow-up with her dermatologist to determine if something else is causing this.

## 2023-01-13 ENCOUNTER — Other Ambulatory Visit: Payer: Self-pay | Admitting: Cardiology

## 2023-01-15 NOTE — Telephone Encounter (Signed)
At last cardiology visit with [facility] patient was to take Rosuvastatin 5mg  3 days a week.  Refill request is for every day so called patient to verify dosing.    Patinet states that she has started Repata 3 months ago as prescribed by PCP.  Patient wants to know if she is to continue Rosuvastatin while taking Repatha.    Next visit with Roanna Raider is on 01/24/23

## 2023-01-21 DIAGNOSIS — J019 Acute sinusitis, unspecified: Secondary | ICD-10-CM | POA: Diagnosis not present

## 2023-01-21 DIAGNOSIS — B9689 Other specified bacterial agents as the cause of diseases classified elsewhere: Secondary | ICD-10-CM | POA: Diagnosis not present

## 2023-01-21 DIAGNOSIS — B370 Candidal stomatitis: Secondary | ICD-10-CM | POA: Diagnosis not present

## 2023-01-24 ENCOUNTER — Encounter: Payer: Self-pay | Admitting: Cardiology

## 2023-01-24 ENCOUNTER — Ambulatory Visit: Payer: PPO | Attending: Cardiology | Admitting: Cardiology

## 2023-01-24 VITALS — BP 120/60 | HR 60 | Ht 60.0 in | Wt 140.0 lb

## 2023-01-24 DIAGNOSIS — K219 Gastro-esophageal reflux disease without esophagitis: Secondary | ICD-10-CM

## 2023-01-24 DIAGNOSIS — R002 Palpitations: Secondary | ICD-10-CM

## 2023-01-24 DIAGNOSIS — K224 Dyskinesia of esophagus: Secondary | ICD-10-CM

## 2023-01-24 DIAGNOSIS — E782 Mixed hyperlipidemia: Secondary | ICD-10-CM | POA: Diagnosis not present

## 2023-01-24 DIAGNOSIS — I10 Essential (primary) hypertension: Secondary | ICD-10-CM | POA: Diagnosis not present

## 2023-01-24 DIAGNOSIS — R011 Cardiac murmur, unspecified: Secondary | ICD-10-CM

## 2023-01-24 NOTE — Progress Notes (Signed)
Cardiology Office Note:  .   Date:  01/24/2023  ID:  Lindsay Tran, DOB 1946-03-06, MRN [id number] PCP: Glori Luis, MD  [facility] Providers Cardiologist:  Yvonne Kendall, MD    History of Present Illness: .   Lindsay Tran is a 77 y.o. female with past medical history of coronary artery calcification, hypertension, hyperlipidemia, varicose veins, peptic ulcer disease, iron deficiency anemia, essential tremor, scoliosis, and symptomatic arthritis, who is here today for follow-up.  Prior echocardiogram revealed LVEF 55 to 60%, G2 DD, mild mitral regurgitation.  Heart monitor revealed predominant sinus rhythm with an average heart rate of 65 bpm.  She did have 27 supraventricular runs that were observed lasting up to 19 beats and several episodes of brief PSVT as well as rare PACs PVCs.  HPI this were completed in 04/2022 the right ankle-brachial index was noncompressible on the right lower extremity and the right toe brachial index was normal, left first ankle-brachial index in the ICAs were noncompressible lower extremity arteries on the left normal left toe brachial index.  She was last seen in clinic 10/18/2022 stating that she had been doing fairly well.  She had stated that her PCP had taken her off simvastatin GFR.  And placed on Zetia for worsening cholesterol.  She was also waiting for prior authorization for Repatha.  We also reached out to gastroenterology to see if she can tolerate hyoscyamine for GI spasms.  She returns clinic today stating that from the cardiac perspective she has been doing well.  She does have concerns about being on Repatha as it is placing her in the donut hole and she fears that she would not be able to afford some of her other medications.  She did follow-up on lipid panel that was completed that showed an LDL dropped from 1 33-13.  She is able to tolerate rosuvastatin without incident.  She has also followed up with GI who is referred her to an  esophageal specialist for esophageal spasms.  She states she continues to occasionally have some swelling to her bilateral lower extremities but that is unchanged.  Denies any shortness of breath or chest discomfort.  Is recently followed up with her primary care provider.  She also followed up with urgent care and was noted to have sinusitis with started on antibiotic therapy.  She states unfortunately she continues to suffer from thrush and is continue to be treated without resolution. Denies any recent hospitalizations or visits to the emergency department.  ROS: 10 point review of systems has been reviewed and is considered negative with exception of what is been listed in the HPI  Studies Reviewed: Marland Kitchen        TTE 02/08/22  1. Left ventricular ejection fraction, by estimation, is 55 to 60%. The  left ventricle has normal function. The left ventricle has no regional  wall motion abnormalities. Left ventricular diastolic parameters are  consistent with Grade II diastolic  dysfunction (pseudonormalization).   2. Right ventricular systolic function is normal. The right ventricular  size is normal.   3. Left atrial size was moderately dilated.   4. The mitral valve is normal in structure. Mild mitral valve  regurgitation.   5. The aortic valve is tricuspid. Aortic valve regurgitation is not  visualized. Aortic valve sclerosis is present, with no evidence of aortic  valve stenosis.   6. The inferior vena cava is normal in size with greater than 50%  respiratory variability, suggesting right atrial pressure  of 3 mmHg. Risk Assessment/Calculations:             Physical Exam:   VS:  BP 120/60 (BP Location: Left Arm, Patient Position: Sitting, Cuff Size: Normal)   Pulse 60   Ht 5' (1.524 m)   Wt 140 lb (63.5 kg)   SpO2 98%   BMI 27.34 kg/m    Wt Readings from Last 3 Encounters:  01/24/23 140 lb (63.5 kg)  01/12/23 138 lb 12.8 oz (63 kg)  10/18/22 144 lb 9.6 oz (65.6 kg)    GEN: Well  nourished, well developed in no acute distress NECK: No JVD; No carotid bruits CARDIAC: RRR, I/VI systolic murmur best heard at the RSB, without rubs or gallops RESPIRATORY:  Clear to auscultation without rales, wheezing or rhonchi  ABDOMEN: Soft, non-tender, non-distended EXTREMITIES: Trace pretibial edema; No deformity   ASSESSMENT AND PLAN: .   Essential hypertension with blood pressure today 120/60.  Blood pressure remained stable.  She is continued on metoprolol 100 mg morning 50 mg in the evening, amlodipine 2.5 mg daily.  She has been encouraged to continue to monitor her blood pressures at home.  Mixed hyperlipidemia with an LDL of 13 on 12/13/22 after starting Repatha injections.  Patient is also continued on rosuvastatin 5 mg 3 times weekly.  She is concerned with having increased cost of being on Repatha placed in the donut hole but she would not be able to afford her other medications.  She has been advised that with the Tylenol.  Concerning for Repatha as well for cholesterol that she can remain off the Repatha we will recheck a lipid panel in 3 months to determine the increase in her cholesterol or if he can safely be maintained on rosuvastatin monotherapy.  She is requesting refill of rosuvastatin to be sent into pharmacy of choice.  Palpitations that have improved.  She occasionally continues to have breakthrough episodes of tachycardia that are primarily evening.  She is continued on metoprolol dosing.  If she starts having increased episodes of palpitations can consider ZIO XT monitor to rule out arrhythmia.  Gastroesophageal reflux disease with longstanding history of esophageal spasms previously continued on amlodipine 2.5 mg for spasms.  After further discussion with her GI they were starting new medications on her for possible spasms and had opted to send her to an esophageal specialist.  So we will hold off on prescribing hyoscyamine for now.  Heart murmur noted on physical  examination.  Echocardiogram in 02/2022 revealed LVEF 55 to 60%, no regional wall motion analysis, G2 DD, and mild mitral valve regurgitation.  Will continue to monitor with surveillance echoes as needed.       Dispo: Patient return to clinic to see MD/APP in 6 months or sooner if needed with an EKG on return.  Signed, Nedra Mcinnis, NP

## 2023-01-24 NOTE — Patient Instructions (Signed)
Medication Instructions:  Your physician recommends that you continue on your current medications as directed. Please refer to the Current Medication list given to you today.  *If you need a refill on your cardiac medications before your next appointment, please call your pharmacy*   Lab Work: Lipid panel to be drawn in 3 months - Please go to the [facility]. You will check in at the front desk to the right as you walk into the atrium. Valet Parking is offered if needed. - No appointment needed. You may go any day between 7 am and 6 pm.  If you have labs (blood work) drawn today and your tests are completely normal, you will receive your results only by: MyChart Message (if you have MyChart) OR A paper copy in the mail If you have any lab test that is abnormal or we need to change your treatment, we will call you to review the results.   Testing/Procedures: No testing ordered  Follow-Up: At [facility], you and your health needs are our priority.  As part of our continuing mission to provide you with exceptional heart care, we have created designated Provider Care Teams.  These Care Teams include your primary Cardiologist (physician) and Advanced Practice Providers (APPs -  Physician Assistants and Nurse Practitioners) who all work together to provide you with the care you need, when you need it.  We recommend signing up for the patient portal called "MyChart".  Sign up information is provided on this After Visit Summary.  MyChart is used to connect with patients for Virtual Visits (Telemedicine).  Patients are able to view lab/test results, encounter notes, upcoming appointments, etc.  Non-urgent messages can be sent to your provider as well.   To learn more about what you can do with MyChart, go to [email or web address].    Your next appointment:   6 month(s)  Provider:   Yvonne Kendall, MD or Charlsie Quest, NP

## 2023-01-25 ENCOUNTER — Other Ambulatory Visit: Payer: Self-pay | Admitting: Dermatology

## 2023-01-28 ENCOUNTER — Other Ambulatory Visit: Payer: Self-pay | Admitting: Family Medicine

## 2023-01-28 DIAGNOSIS — B370 Candidal stomatitis: Secondary | ICD-10-CM

## 2023-01-30 ENCOUNTER — Encounter: Payer: Self-pay | Admitting: Family Medicine

## 2023-01-30 DIAGNOSIS — B370 Candidal stomatitis: Secondary | ICD-10-CM

## 2023-01-30 DIAGNOSIS — R682 Dry mouth, unspecified: Secondary | ICD-10-CM

## 2023-02-02 MED ORDER — FLUCONAZOLE 200 MG PO TABS: 400.0000 mg | ORAL_TABLET | Freq: Every day | ORAL | 0 refills | Status: DC

## 2023-02-02 NOTE — Addendum Note (Signed)
Addended by: Birdie Sons Gabbriella Presswood G on: 02/02/2023 12:55 PM   Modules accepted: Orders

## 2023-02-05 ENCOUNTER — Telehealth: Payer: Self-pay | Admitting: Family Medicine

## 2023-02-05 NOTE — Telephone Encounter (Signed)
Copied from CRM [id number]. Topic: Medicare AWV >> Feb 05, 2023  3:42 PM Payton Doughty wrote: Reason for CRM: LVM 02/05/23 to r/s 8//8/24 AWV. New AWV date 02/19/23 @4pm . Please confirm date change khc  Verlee Rossetti; Care Guide Ambulatory Clinical Support [place] [facility] Direct Dial: [phone]

## 2023-02-06 ENCOUNTER — Telehealth: Payer: PPO | Admitting: Internal Medicine

## 2023-02-06 ENCOUNTER — Encounter: Payer: Self-pay | Admitting: Internal Medicine

## 2023-02-06 DIAGNOSIS — J029 Acute pharyngitis, unspecified: Secondary | ICD-10-CM | POA: Diagnosis not present

## 2023-02-06 NOTE — Progress Notes (Unsigned)
Virtual Visit via Caregility   Note   This format is felt to be most appropriate for this patient at this time.  All issues noted in this document were discussed and addressed.  No physical exam was performed (except for noted visual exam findings with Video Visits).   I connected with Lindsay Tran  on 02/06/23 at  1:30 PM EDT by a video enabled telemedicine application or telephone and verified that I am speaking with the correct person using two identifiers. Location patient: home Location provider: work or home office Persons participating in the virtual visit: patient, provider  I discussed the limitations, risks, security and privacy concerns of performing an evaluation and management service by telephone and the availability of in person appointments. I also discussed with the patient that there may be a patient responsible charge related to this service. The patient expressed understanding and agreed to proceed.   Reason for visit: persistent throat discomfort   HPI: 77 yr old female with history of PSORIATIC ARTHRITIS,  ESOPHAGEAL DYSMOTILITY  and hiatal hernia  PRESENTS FOR SORE THROAT''    H/O Nissen fundoplication in 2018 for management of hiatal hernia , has has esophageal dysmotility since then , presents with recurrent throat irritation for over 7 months .  Told she had thrush by her dermatologist in January while taking Cosentyx for psoriatic athritis . COSENTYX STOPPED.  Given fluconazole and clotrimazole but symptoms did not improve.  Saw margaret in March,  no thrush seen .  Advised to resume famotidine fo GERD . Saw GI   protonix increased to BId.  .  The saw sonnenbery in June repeat treatment for thrush althoug exam was normal due treport of white tongue.  No culture done.  Treated for chronoic sinusitis with 10 days of cefdinir by urgetn care and symptoms resolved for one week , now have returned  Sore throat,  bad taste in lmoutn feels lie it'c meing up from esophagus .     Sees ent aug 31.  Scheduled to see an  esophageal specialist  at [facility] date TBC  Has tried sprays/losenges for dry mouth: have her diarrhea   Snores,  seeps with mlut open , but symtpoms worse as day progresses  Averages 24 ounce s of water daily .  Gave up coco cola.   Soft diet.  Due to dysmotility and striture   ROS: See pertinent positives and negatives per HPI.  Past Medical History:  Diagnosis Date   Abdominal pain    Adjustment disorder with anxiety    Anemia    Anxiety    adjustment disorder with anxiety   Arthritis    Atrophic vaginitis    BPPV (benign paroxysmal positional vertigo)    CAD in native artery    Cataract    Chronic diarrhea    Chronic pain of right knee    Coronary artery disease    Dysphagia    Dysphasia    Dysuria    Family history of colon cancer    Gastric ulcer 1980's   Gastric ulcer    Gastroesophageal hernia    GERD (gastroesophageal reflux disease)    High cholesterol    Hyperlipidemia    Hypertension    Macular degeneration    Osteoporosis    Osteoporosis    post-menopausal   Pedal edema    Psoriasis    Schatzki's ring 05/02/2013   Schatzki's ring    Scoliosis    Scoliosis    UTI (urinary tract infection)  Past Surgical History:  Procedure Laterality Date   ABDOMINAL HYSTERECTOMY     CARDIAC CATHETERIZATION     CHOLECYSTECTOMY     COLONOSCOPY     COLONOSCOPY N/A 11/12/2014   Procedure: COLONOSCOPY;  Surgeon: Scot Jun, MD;  Location: [facility] ENDOSCOPY;  Service: Endoscopy;  Laterality: N/A;   COLONOSCOPY N/A 05/24/2020   Procedure: COLONOSCOPY;  Surgeon: Toledo, Boykin Nearing, MD;  Location: [facility] ENDOSCOPY;  Service: Endoscopy;  Laterality: N/A;   DIAGNOSTIC LAPAROSCOPY     paraesphogeal hernia   ESOPHAGOGASTRODUODENOSCOPY     ESOPHAGOGASTRODUODENOSCOPY (EGD) WITH PROPOFOL N/A 04/16/2017   Procedure: ESOPHAGOGASTRODUODENOSCOPY (EGD) WITH PROPOFOL;  Surgeon: Scot Jun, MD;  Location: [facility] ENDOSCOPY;  Service:  Endoscopy;  Laterality: N/A;   ESOPHAGOGASTRODUODENOSCOPY (EGD) WITH PROPOFOL N/A 02/10/2021   Procedure: ESOPHAGOGASTRODUODENOSCOPY (EGD) WITH PROPOFOL;  Surgeon: Wyline Mood, MD;  Location: [facility] ENDOSCOPY;  Service: Gastroenterology;  Laterality: N/A;   ESOPHAGOGASTRODUODENOSCOPY (EGD) WITH PROPOFOL N/A 11/28/2021   Procedure: ESOPHAGOGASTRODUODENOSCOPY (EGD) WITH PROPOFOL;  Surgeon: Regis Bill, MD;  Location: [facility] ENDOSCOPY;  Service: Endoscopy;  Laterality: N/A;   GIVENS CAPSULE STUDY N/A 03/08/2021   Procedure: GIVENS CAPSULE STUDY;  Surgeon: Wyline Mood, MD;  Location: [facility] ENDOSCOPY;  Service: Gastroenterology;  Laterality: N/A;   HERNIA REPAIR     inguinal hernia   HERNIA REPAIR     umbilical hernia    Family History  Problem Relation Age of Onset   Diabetes Mother    Parkinson's disease Mother    Stroke Mother    Heart disease Mother    Cancer Father        Pancreatic   Cancer Sister        Lung   Diabetes Sister    Diabetes Sister    Alzheimer's disease Sister    Tics Sister    Stroke Brother    Restless legs syndrome Brother    Diverticulosis Daughter    Alzheimer's disease Paternal Aunt        x2 sisters   Breast cancer Neg Hx     SOCIAL HX: ***   Current Outpatient Medications:    amLODipine (NORVASC) 2.5 MG tablet, Take 1 tablet (2.5 mg total) by mouth daily., Disp: 90 tablet, Rfl: 3   busPIRone (BUSPAR) 7.5 MG tablet, Take 15 mg by mouth 2 (two) times daily., Disp: , Rfl:    clobetasol ointment (TEMOVATE) 0.05 %, Apply 1 application topically 2 (two) times daily as needed. For rash. Avoid face., Disp: 180 g, Rfl: 1   Cyanocobalamin (VITAMIN B 12 PO), Take by mouth daily., Disp: , Rfl:    estradiol (ESTRACE) 0.1 MG/GM vaginal cream, 1 APPLICATORFUL TWICE WEEKLY, Disp: 127.5 g, Rfl: 1   famotidine (PEPCID) 20 MG tablet, TAKE 1 TABLET BY MOUTH EVERYDAY AT BEDTIME, Disp: 90 tablet, Rfl: 1   fluconazole (DIFLUCAN) 200 MG tablet, Take 2 tablets (400 mg  total) by mouth daily., Disp: 7 tablet, Rfl: 0   fluticasone (FLONASE) 50 MCG/ACT nasal spray, Place 1 spray into both nostrils daily., Disp: , Rfl:    gabapentin (NEURONTIN) 300 MG capsule, Take 300 mg by mouth 2 (two) times daily., Disp: , Rfl:    ketoconazole (NIZORAL) 2 % cream, Apply under breasts once daily, twice daily with flares., Disp: 60 g, Rfl: 3   magnesium oxide (MAG-OX) 400 MG tablet, Take 1 tablet by mouth daily., Disp: , Rfl:    metoprolol tartrate (LOPRESSOR) 100 MG tablet, Take 100mg  (1 tablet)  in the morning and  50mg  (0.5 tablet)  in the evening., Disp: 135 tablet, Rfl: 3   Multiple Vitamins-Minerals (PRESERVISION AREDS 2 PO), Take by mouth daily., Disp: , Rfl:    Omega-3 Fatty Acids (OMEGA 3 500 PO), Take 1 capsule by mouth daily. , Disp: , Rfl:    pantoprazole (PROTONIX) 40 MG tablet, Take 1 tablet (40 mg total) by mouth daily., Disp: 90 tablet, Rfl: 3   rosuvastatin (CRESTOR) 5 MG tablet, Take 1 tablet (5 mg total) by mouth daily. Take 3 times a week., Disp: 12 tablet, Rfl: 0   triamcinolone ointment (KENALOG) 0.1 %, Apply topically 2 (two) times daily. Use until rash improved. Avoid face, groin, underarms., Disp: 240 g, Rfl: 0   valACYclovir (VALTREX) 500 MG tablet, Take 1 tablet (500 mg total) by mouth 2 (two) times daily., Disp: 60 tablet, Rfl: 2   Evolocumab (REPATHA SURECLICK) 140 MG/ML SOAJ, Inject 140 mg into the skin every 14 (fourteen) days., Disp: 6 mL, Rfl: 2   Secukinumab, 300 MG Dose, (COSENTYX SENSOREADY, 300 MG,) 150 MG/ML SOAJ, Inject 300 mg into the skin as directed. On week 0, 1, 2, 3 and 4., Disp: 10 mL, Rfl: 0  EXAM:  VITALS per patient if applicable:  GENERAL: alert, oriented, appears well and in no acute distress  HEENT: atraumatic, conjunttiva clear, no obvious abnormalities on inspection of external nose and ears  NECK: normal movements of the head and neck  LUNGS: on inspection no signs of respiratory distress, breathing rate appears normal,  no obvious gross SOB, gasping or wheezing  CV: no obvious cyanosis  MS: moves all visible extremities without noticeable abnormality  PSYCH/NEURO: pleasant and cooperative, no obvious depression or anxiety, speech and thought processing grossly intact  ASSESSMENT AND PLAN: There are no diagnoses linked to this encounter.    I discussed the assessment and treatment plan with the patient. The patient was provided an opportunity to ask questions and all were answered. The patient agreed with the plan and demonstrated an understanding of the instructions.   The patient was advised to call back or seek an in-person evaluation if the symptoms worsen or if the condition fails to improve as anticipated.   I spent 30 minutes dedicated to the care of this patient on the date of this encounter to include pre-visit review of his medical history,  Face-to-face time with the patient , and post visit ordering of testing and therapeutics.    Lindsay Shams, MD

## 2023-02-07 DIAGNOSIS — J029 Acute pharyngitis, unspecified: Secondary | ICD-10-CM | POA: Insufficient documentation

## 2023-02-07 DIAGNOSIS — K219 Gastro-esophageal reflux disease without esophagitis: Secondary | ICD-10-CM | POA: Insufficient documentation

## 2023-02-07 NOTE — Assessment & Plan Note (Signed)
It appears that the only testing that has been dose was a rapid strep test by NP Arnette in March.  I have recommended NO MORE ANTIBIOTICS OR ANTIFUNGALS until cultures/scrapings confirm an infectious etiology.  I will see patient on Thursday to do this.

## 2023-02-08 ENCOUNTER — Encounter: Payer: Self-pay | Admitting: Internal Medicine

## 2023-02-08 ENCOUNTER — Ambulatory Visit: Payer: PPO | Admitting: Internal Medicine

## 2023-02-08 VITALS — BP 104/70 | HR 59 | Temp 97.6°F | Ht 60.0 in | Wt 136.0 lb

## 2023-02-08 DIAGNOSIS — K529 Noninfective gastroenteritis and colitis, unspecified: Secondary | ICD-10-CM | POA: Diagnosis not present

## 2023-02-08 DIAGNOSIS — J029 Acute pharyngitis, unspecified: Secondary | ICD-10-CM | POA: Diagnosis not present

## 2023-02-08 LAB — CBC WITH DIFFERENTIAL/PLATELET
Basophils Absolute: 0 10*3/uL (ref 0.0–0.1)
Basophils Relative: 0.5 % (ref 0.0–3.0)
Eosinophils Absolute: 0.1 10*3/uL (ref 0.0–0.7)
Eosinophils Relative: 1.9 % (ref 0.0–5.0)
HCT: 39.6 % (ref 36.0–46.0)
Hemoglobin: 12.9 g/dL (ref 12.0–15.0)
Lymphocytes Relative: 21.9 % (ref 12.0–46.0)
Lymphs Abs: 1.3 10*3/uL (ref 0.7–4.0)
MCHC: 32.6 g/dL (ref 30.0–36.0)
MCV: 89.1 fl (ref 78.0–100.0)
Monocytes Absolute: 0.5 10*3/uL (ref 0.1–1.0)
Monocytes Relative: 8.7 % (ref 3.0–12.0)
Neutro Abs: 3.9 10*3/uL (ref 1.4–7.7)
Neutrophils Relative %: 67 % (ref 43.0–77.0)
Platelets: 297 10*3/uL (ref 150.0–400.0)
RBC: 4.45 Mil/uL (ref 3.87–5.11)
RDW: 14.1 % (ref 11.5–15.5)
WBC: 5.8 10*3/uL (ref 4.0–10.5)

## 2023-02-08 LAB — SEDIMENTATION RATE: Sed Rate: 14 mm/hr (ref 0–30)

## 2023-02-08 LAB — COMPREHENSIVE METABOLIC PANEL
ALT: 12 U/L (ref 0–35)
AST: 20 U/L (ref 0–37)
Albumin: 4.3 g/dL (ref 3.5–5.2)
Alkaline Phosphatase: 66 U/L (ref 39–117)
BUN: 9 mg/dL (ref 6–23)
CO2: 27 mEq/L (ref 19–32)
Calcium: 9.4 mg/dL (ref 8.4–10.5)
Chloride: 101 mEq/L (ref 96–112)
Creatinine, Ser: 0.66 mg/dL (ref 0.40–1.20)
GFR: 83.39 mL/min (ref >60.00)
Glucose, Bld: 101 mg/dL — ABNORMAL HIGH (ref 70–99)
Potassium: 3.7 mEq/L (ref 3.5–5.1)
Sodium: 139 mEq/L (ref 135–145)
Total Bilirubin: 0.6 mg/dL (ref 0.2–1.2)
Total Protein: 7.2 g/dL (ref 6.0–8.3)

## 2023-02-08 LAB — C-REACTIVE PROTEIN: CRP: <1 mg/dL (ref 0.5–20.0)

## 2023-02-08 NOTE — Progress Notes (Signed)
Subjective:  Patient ID: Lindsay Tran, female    DOB: Jun 10, 1946  Age: 77 y.o. MRN: [id number]  CC: The primary encounter diagnosis was Pharyngitis, unspecified etiology. A diagnosis of Chronic diarrhea was also pertinent to this visit.   HPI Lindsay Tran presents for  Chief Complaint  Patient presents with   Sore Throat    Symptoms of sore throat,  trouble swallowing,  and dysgeusia involving most foods , has been present for 5 to 7 months .  Feels badly.  No recent diagnosis of COVID  has frequent esophageal spasms occurring even with water resulting in regurgitation.   Last appt with Fransico Setters in May resulted in referral to [facility] for esophageal manometry , which was done and resulted in another referral to a specialist,  recurrent throat irritation for over 7 months .  Told she had thrush by her dermatologist in January ; at the time she was taking Cosentyx for psoriatic athritis . COSENTYX STOPPED.  She was prescribed empiric tx for thrush with fluconazole and clotrimazole troches,  but symptoms did not improve.  Saw NP Arnette in March,  no thrush was appreciated  Advised to resume nighttime famotidine for presumed  GERD . Saw GI soon after,  symptoms still present despite using famotidine 4 times daily (per patient),  she was re prescribed  protonix twice daily instead.   Saw PCP Sonnenberg  in June ; repeat empiric treatment for thrush although exam was normal , because patient had reported a white tongue.  No culture done.  Treated for chronic sinusitis on July 14 with 10 days of cefdinir by Urgent care and symptoms resolved for one week , now have returned  Symptoms are Sore throat,  bad taste in mouth;  feels like the bad taste eminates from her esophagus .   Has tried sprays/losenges for dry mouth: ingredient caused her to have diarrhea   Snores,  told she sleeps with mouth open , but symptoms are not worse in the morning; they worsen as the day progresses Averages 24 ounce s  of water daily .  Gave up coco cola.   Soft diet.  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 Note is made of prior Nissen  fundoplication. In this study, there was a second high pressure zone beyond the LES and diaphragm, which may  be the wrap. This may explain the manometry findings (i.e. if slipped or herniated). Consider intrinsic LES issue per clinical history and adjunctive testing.   Prior to 2024 her GI evalluations are summarized below (taken from Fransico Setters' [facility] clinic office note)   EGD: 04/16/2017 - normal esophagus s/p empiric dilatation with Savary dilator at 15 mm Path: acute erosive gastritis negative for H pylori, normal duodenum. No repeat.   - 05/24/2020: colonoscopy (Indic FH colon polyps): Imp: non-bldg IH, diverticulosis in sigmoid colon, one 4 mm TA polyp in prox sig colon.No repeat.   - 02/10/2021: EGD (Indic: IDA) Imp: NL esophagus, large non-bldg duodenal diverticulum , normal mucosa in entire examined duodenum, gastritis, nodular mucosa in the gastric antrum. Biopsies of the duodenum showed no abnormalities. Mild chronic gastritis seen in the stomach negative for intestinal metaplasia and H. Pylori.   - 03/08/2021: Givens capsule study of small bowel for UVO:ZDGU by Dr. Tobi Bastos as negative.  Sees ENT aug 31.  Scheduled to see an  esophageal specialist  at [facility] date [facility]   PMH:  H/O Nissen fundoplication in 2018 for management of hiatal hernia , has has esophageal dysmotility since then ,   Outpatient Medications Prior to Visit  Medication Sig Dispense Refill   amLODipine (NORVASC) 2.5 MG tablet Take 1 tablet (2.5 mg total) by mouth daily. 90 tablet 3   busPIRone (BUSPAR) 7.5 MG tablet Take 15 mg by mouth 2 (two) times daily.     clobetasol ointment (TEMOVATE)  0.05 % Apply 1 application topically 2 (two) times daily as needed. For rash. Avoid face. 180 g 1   Cyanocobalamin (VITAMIN B 12 PO) Take by mouth daily.     estradiol (ESTRACE) 0.1 MG/GM vaginal cream 1 APPLICATORFUL TWICE WEEKLY 127.5 g 1   famotidine (PEPCID) 20 MG tablet TAKE 1 TABLET BY MOUTH EVERYDAY AT BEDTIME 90 tablet 1   fluticasone (FLONASE) 50 MCG/ACT nasal spray Place 1 spray into both nostrils daily.     gabapentin (NEURONTIN) 300 MG capsule Take 300 mg by mouth 2 (two) times daily.     ketoconazole (NIZORAL) 2 % cream Apply under breasts once daily, twice daily with flares. 60 g 3   magnesium oxide (MAG-OX) 400 MG tablet Take 1 tablet by mouth daily.     metoprolol tartrate (LOPRESSOR) 100 MG tablet Take 100mg  (1 tablet)  in the morning and 50mg  (0.5 tablet)  in the evening. 135 tablet 3   Multiple Vitamins-Minerals (PRESERVISION AREDS 2 PO) Take by mouth daily.     Omega-3 Fatty Acids (OMEGA 3 500 PO) Take 1 capsule by mouth daily.      pantoprazole (PROTONIX) 40 MG tablet Take 1 tablet (40 mg total) by mouth daily. 90 tablet 3   rosuvastatin (CRESTOR) 5 MG tablet Take 1 tablet (5 mg total) by mouth daily. Take 3 times a week. 12 tablet 0   triamcinolone ointment (KENALOG) 0.1 % Apply topically 2 (two) times daily. Use until rash improved. Avoid face, groin, underarms. 240 g 0   valACYclovir (VALTREX) 500 MG tablet Take 1 tablet (500 mg total) by mouth 2 (two) times daily. 60 tablet 2   Evolocumab (REPATHA SURECLICK) 140 MG/ML SOAJ Inject 140 mg into the skin every 14 (fourteen) days. 6 mL 2   fluconazole (DIFLUCAN) 200 MG tablet Take 2 tablets (400 mg total) by mouth daily. (Patient not taking: Reported on 02/08/2023) 7 tablet 0   Secukinumab, 300 MG Dose, (COSENTYX SENSOREADY, 300 MG,) 150 MG/ML SOAJ Inject 300 mg into the skin as directed. On week 0, 1, 2, 3 and 4. 10 mL 0   No facility-administered medications prior to visit.    Review of Systems;  Patient denies headache,  fevers, malaise, unintentional weight loss, skin rash, eye pain, sinus congestion and sinus pain, sore throat, dysphagia,  hemoptysis , cough, dyspnea, wheezing, chest pain, palpitations, orthopnea, edema, abdominal pain, nausea, melena, diarrhea, constipation, flank pain, dysuria, hematuria, urinary  Frequency, nocturia, numbness, tingling, seizures,  Focal weakness, Loss of consciousness,  Tremor, insomnia, depression, anxiety, and suicidal ideation.      Objective:  BP 104/70   Pulse (!) 59   Temp 97.6 F (36.4 C)   Ht 5' (1.524 m)   Wt 136 lb (61.7 kg)   SpO2 96%   BMI 26.56 kg/m   BP Readings from Last 3 Encounters:  02/08/23 104/70  01/24/23 120/60  01/12/23 112/72    Wt Readings from Last  3 Encounters:  02/08/23 136 lb (61.7 kg)  01/24/23 140 lb (63.5 kg)  01/12/23 138 lb 12.8 oz (63 kg)    Physical Exam Vitals reviewed.  Constitutional:      General: She is not in acute distress.    Appearance: Normal appearance. She is normal weight. She is not ill-appearing, toxic-appearing or diaphoretic.  HENT:     Head: Normocephalic.     Mouth/Throat:     Mouth: No oral lesions.     Pharynx: No pharyngeal swelling, oropharyngeal exudate, posterior oropharyngeal erythema or uvula swelling.     Tonsils: No tonsillar exudate or tonsillar abscesses.  Eyes:     General: No scleral icterus.       Right eye: No discharge.        Left eye: No discharge.     Conjunctiva/sclera: Conjunctivae normal.  Cardiovascular:     Rate and Rhythm: Normal rate and regular rhythm.     Heart sounds: Normal heart sounds.  Pulmonary:     Effort: Pulmonary effort is normal. No respiratory distress.     Breath sounds: Normal breath sounds.  Musculoskeletal:        General: Normal range of motion.  Skin:    General: Skin is warm and dry.  Neurological:     General: No focal deficit present.     Mental Status: She is alert and oriented to person, place, and time. Mental status is at baseline.   Psychiatric:        Mood and Affect: Mood normal.        Behavior: Behavior normal.        Thought Content: Thought content normal.        Judgment: Judgment normal.    Lab Results  Component Value Date   HGBA1C 5.4 07/22/2020   HGBA1C 5.3 09/20/2017   HGBA1C 5.9 09/04/2016    Lab Results  Component Value Date   CREATININE 0.66 02/08/2023   CREATININE 0.67 08/01/2022   CREATININE 0.66 04/19/2022    Lab Results  Component Value Date   WBC 5.8 02/08/2023   HGB 12.9 02/08/2023   HCT 39.6 02/08/2023   PLT 297.0 02/08/2023   GLUCOSE 101 (H) 02/08/2023   CHOL 111 12/13/2022   TRIG 181 (H) 12/13/2022   HDL 62 12/13/2022   LDLDIRECT 134.0 09/27/2022   LDLCALC 13 12/13/2022   ALT 12 02/08/2023   AST 20 02/08/2023   NA 139 02/08/2023   K 3.7 02/08/2023   CL 101 02/08/2023   CREATININE 0.66 02/08/2023   BUN 9 02/08/2023   CO2 27 02/08/2023   TSH 2.56 12/20/2021   HGBA1C 5.4 07/22/2020    No results found.  Assessment & Plan:  .Pharyngitis, unspecified etiology Assessment & Plan: Her exam is normal.  Culture of buccal mucosa done today is pending.  CBC  ESR.  CRP all normal. The etiology of her symptoms is likely multfactorial, given her complicated GU history , but are clearly not infectious.   I have recommended NO MORE ANTIBIOTICS OR ANTIFUNGALS unless cultures/scrapings confirm an infectious etiology.  Keep appt with ENT and GI follow u p    Orders: -     Culture, Group A Strep -     CBC with Differential/Platelet -     Sedimentation rate -     C-reactive protein  Chronic diarrhea -     Comprehensive metabolic panel     I provided 30 minutes of face-to-face time during this encounter reviewing patient's  last visit with me, patient's  most recent visit with cardiology,  nephrology,  and neurology,  recent surgical and non surgical procedures, previous  labs and imaging studies, counseling on currently addressed issues,  and post visit ordering to diagnostics  and therapeutics .   Follow-up: No follow-ups on file.   Sherlene Shams, MD

## 2023-02-08 NOTE — Patient Instructions (Signed)
Your tongue and your throat appear normal,  but  culture is being sent  Continue using the chin strap at night and the protonix and famotidine as you are doing   For the post nasal drip,  I recommend a one week trial of Benadryl at night:  take one hour before bedtime  During the benadryl trial, please SUSPEND your evening dose of Buspirone because I don't want you to get oversedated

## 2023-02-10 NOTE — Assessment & Plan Note (Addendum)
Her exam is normal.  Culture of buccal mucosa done today is pending.  CBC  ESR.  CRP all normal. The etiology of her symptoms is likely multfactorial, given her complicated GU history , but are clearly not infectious.   I have recommended NO MORE ANTIBIOTICS OR ANTIFUNGALS unless cultures/scrapings confirm an infectious etiology.  Keep appt with ENT and GI follow u p

## 2023-02-14 MED ORDER — HYOSCYAMINE SULFATE 0.125 MG SL SUBL: 0.1250 mg | SUBLINGUAL_TABLET | SUBLINGUAL | 0 refills | Status: DC | PRN

## 2023-02-14 NOTE — Addendum Note (Signed)
Addended by: Sherlene Shams on: 02/14/2023 05:38 PM   Modules accepted: Orders

## 2023-02-19 ENCOUNTER — Ambulatory Visit: Payer: PPO | Admitting: Dermatology

## 2023-02-19 ENCOUNTER — Ambulatory Visit (INDEPENDENT_AMBULATORY_CARE_PROVIDER_SITE_OTHER): Payer: PPO | Admitting: *Deleted

## 2023-02-19 ENCOUNTER — Encounter: Payer: Self-pay | Admitting: Dermatology

## 2023-02-19 VITALS — BP 110/62 | Ht 60.0 in | Wt 135.0 lb

## 2023-02-19 VITALS — BP 110/67

## 2023-02-19 DIAGNOSIS — Z0000 Encounter for general adult medical examination without abnormal findings: Secondary | ICD-10-CM | POA: Diagnosis not present

## 2023-02-19 DIAGNOSIS — Z8619 Personal history of other infectious and parasitic diseases: Secondary | ICD-10-CM | POA: Diagnosis not present

## 2023-02-19 DIAGNOSIS — Z7189 Other specified counseling: Secondary | ICD-10-CM

## 2023-02-19 DIAGNOSIS — Z79899 Other long term (current) drug therapy: Secondary | ICD-10-CM

## 2023-02-19 DIAGNOSIS — K219 Gastro-esophageal reflux disease without esophagitis: Secondary | ICD-10-CM | POA: Diagnosis not present

## 2023-02-19 DIAGNOSIS — K224 Dyskinesia of esophagus: Secondary | ICD-10-CM | POA: Diagnosis not present

## 2023-02-19 DIAGNOSIS — R1310 Dysphagia, unspecified: Secondary | ICD-10-CM | POA: Diagnosis not present

## 2023-02-19 DIAGNOSIS — L409 Psoriasis, unspecified: Secondary | ICD-10-CM

## 2023-02-19 DIAGNOSIS — R432 Parageusia: Secondary | ICD-10-CM | POA: Diagnosis not present

## 2023-02-19 DIAGNOSIS — K529 Noninfective gastroenteritis and colitis, unspecified: Secondary | ICD-10-CM | POA: Diagnosis not present

## 2023-02-19 DIAGNOSIS — K222 Esophageal obstruction: Secondary | ICD-10-CM | POA: Diagnosis not present

## 2023-02-19 DIAGNOSIS — Z8719 Personal history of other diseases of the digestive system: Secondary | ICD-10-CM | POA: Diagnosis not present

## 2023-02-19 MED ORDER — CLOTRIMAZOLE 10 MG MT TROC: 10.0000 mg | Freq: Three times a day (TID) | OROMUCOSAL | 3 refills | Status: DC

## 2023-02-19 NOTE — Patient Instructions (Signed)
[name] , Thank you for taking time to come for your Medicare Wellness Visit. I appreciate your ongoing commitment to your health goals. Please review the following plan we discussed and let me know if I can assist you in the future.   Referrals/Orders/Follow-Ups/Clinician Recommendations: None  This is a list of the screening recommended for you and due dates:  Health Maintenance  Topic Date Due   COVID-19 Vaccine (6 - 2023-24 season) 05/26/2022   Flu Shot  10/08/2023*   Mammogram  05/25/2023   Medicare Annual Wellness Visit  02/19/2024   DTaP/Tdap/Td vaccine (2 - Td or Tdap) 07/20/2025   Pneumonia Vaccine  Completed   DEXA scan (bone density measurement)  Completed   Hepatitis C Screening  Completed   Zoster (Shingles) Vaccine  Completed   HPV Vaccine  Aged Out   Colon Cancer Screening  Discontinued  *Topic was postponed. The date shown is not the original due date.    Advanced directives: (Copy Requested) Please bring a copy of your health care power of attorney and living will to the office to be added to your chart at your convenience.  Next Medicare Annual Wellness Visit scheduled for next year: Yes 02/20/24 @ 1:30  Preventive Care 65 Years and Older, Female Preventive care refers to lifestyle choices and visits with your health care provider that can promote health and wellness. What does preventive care include? A yearly physical exam. This is also called an annual well check. Dental exams once or twice a year. Routine eye exams. Ask your health care provider how often you should have your eyes checked. Personal lifestyle choices, including: Daily care of your teeth and gums. Regular physical activity. Eating a healthy diet. Avoiding tobacco and drug use. Limiting alcohol use. Practicing safe sex. Taking low-dose aspirin every day. Taking vitamin and mineral supplements as recommended by your health care provider. What happens during an annual well check? The  services and screenings done by your health care provider during your annual well check will depend on your age, overall health, lifestyle risk factors, and family history of disease. Counseling  Your health care provider may ask you questions about your: Alcohol use. Tobacco use. Drug use. Emotional well-being. Home and relationship well-being. Sexual activity. Eating habits. History of falls. Memory and ability to understand (cognition). Work and work Astronomer. Reproductive health. Screening  You may have the following tests or measurements: Height, weight, and BMI. Blood pressure. Lipid and cholesterol levels. These may be checked every 5 years, or more frequently if you are over 31 years old. Skin check. Lung cancer screening. You may have this screening every year starting at age 24 if you have a 30-pack-year history of smoking and currently smoke or have quit within the past 15 years. Fecal occult blood test (FOBT) of the stool. You may have this test every year starting at age 69. Flexible sigmoidoscopy or colonoscopy. You may have a sigmoidoscopy every 5 years or a colonoscopy every 10 years starting at age 10. Hepatitis C blood test. Hepatitis B blood test. Sexually transmitted disease (STD) testing. Diabetes screening. This is done by checking your blood sugar (glucose) after you have not eaten for a while (fasting). You may have this done every 1-3 years. Bone density scan. This is done to screen for osteoporosis. You may have this done starting at age 52. Mammogram. This may be done every 1-2 years. Talk to your health care provider about how often you should have regular mammograms. Talk  with your health care provider about your test results, treatment options, and if necessary, the need for more tests. Vaccines  Your health care provider may recommend certain vaccines, such as: Influenza vaccine. This is recommended every year. Tetanus, diphtheria, and acellular  pertussis (Tdap, Td) vaccine. You may need a Td booster every 10 years. Zoster vaccine. You may need this after age 87. Pneumococcal 13-valent conjugate (PCV13) vaccine. One dose is recommended after age 100. Pneumococcal polysaccharide (PPSV23) vaccine. One dose is recommended after age 33. Talk to your health care provider about which screenings and vaccines you need and how often you need them. This information is not intended to replace advice given to you by your health care provider. Make sure you discuss any questions you have with your health care provider. Document Released: 07/23/2015 Document Revised: 03/15/2016 Document Reviewed: 04/27/2015 Elsevier Interactive Patient Education  2017 [employer].  Fall Prevention in the Home Falls can cause injuries. They can happen to people of all ages. There are many things you can do to make your home safe and to help prevent falls. What can I do on the outside of my home? Regularly fix the edges of walkways and driveways and fix any cracks. Remove anything that might make you trip as you walk through a door, such as a raised step or threshold. Trim any bushes or trees on the path to your home. Use bright outdoor lighting. Clear any walking paths of anything that might make someone trip, such as rocks or tools. Regularly check to see if handrails are loose or broken. Make sure that both sides of any steps have handrails. Any raised decks and porches should have guardrails on the edges. Have any leaves, snow, or ice cleared regularly. Use sand or salt on walking paths during winter. Clean up any spills in your garage right away. This includes oil or grease spills. What can I do in the bathroom? Use night lights. Install grab bars by the toilet and in the tub and shower. Do not use towel bars as grab bars. Use non-skid mats or decals in the tub or shower. If you need to sit down in the shower, use a plastic, non-slip stool. Keep the floor  dry. Clean up any water that spills on the floor as soon as it happens. Remove soap buildup in the tub or shower regularly. Attach bath mats securely with double-sided non-slip rug tape. Do not have throw rugs and other things on the floor that can make you trip. What can I do in the bedroom? Use night lights. Make sure that you have a light by your bed that is easy to reach. Do not use any sheets or blankets that are too big for your bed. They should not hang down onto the floor. Have a firm chair that has side arms. You can use this for support while you get dressed. Do not have throw rugs and other things on the floor that can make you trip. What can I do in the kitchen? Clean up any spills right away. Avoid walking on wet floors. Keep items that you use a lot in easy-to-reach places. If you need to reach something above you, use a strong step stool that has a grab bar. Keep electrical cords out of the way. Do not use floor polish or wax that makes floors slippery. If you must use wax, use non-skid floor wax. Do not have throw rugs and other things on the floor that can make you  trip. What can I do with my stairs? Do not leave any items on the stairs. Make sure that there are handrails on both sides of the stairs and use them. Fix handrails that are broken or loose. Make sure that handrails are as long as the stairways. Check any carpeting to make sure that it is firmly attached to the stairs. Fix any carpet that is loose or worn. Avoid having throw rugs at the top or bottom of the stairs. If you do have throw rugs, attach them to the floor with carpet tape. Make sure that you have a light switch at the top of the stairs and the bottom of the stairs. If you do not have them, ask someone to add them for you. What else can I do to help prevent falls? Wear shoes that: Do not have high heels. Have rubber bottoms. Are comfortable and fit you well. Are closed at the toe. Do not wear  sandals. If you use a stepladder: Make sure that it is fully opened. Do not climb a closed stepladder. Make sure that both sides of the stepladder are locked into place. Ask someone to hold it for you, if possible. Clearly mark and make sure that you can see: Any grab bars or handrails. First and last steps. Where the edge of each step is. Use tools that help you move around (mobility aids) if they are needed. These include: Canes. Walkers. Scooters. Crutches. Turn on the lights when you go into a dark area. Replace any light bulbs as soon as they burn out. Set up your furniture so you have a clear path. Avoid moving your furniture around. If any of your floors are uneven, fix them. If there are any pets around you, be aware of where they are. Review your medicines with your doctor. Some medicines can make you feel dizzy. This can increase your chance of falling. Ask your doctor what other things that you can do to help prevent falls. This information is not intended to replace advice given to you by your health care provider. Make sure you discuss any questions you have with your health care provider. Document Released: 04/22/2009 Document Revised: 12/02/2015 Document Reviewed: 07/31/2014 Elsevier Interactive Patient Education  2017 [employer].

## 2023-02-19 NOTE — Progress Notes (Signed)
   Follow-Up Visit   Subjective  Lindsay Tran is a 77 y.o. female who presents for the following: Psoriasis - She stopped Cosentyx about 4 months ago because she was having issues with her mouth sores but it has been determined that Cosentyx was not the issue. She would like to restart Cosentyx because her psoriasis is just now starting to come back. She had some random itches that started about 3 days ago but no rash. She has had a few psoriasis spots pop up on her arms recently. She did notice that her joint aches improved on Cosentyx.   The following portions of the chart were reviewed this encounter and updated as appropriate: medications, allergies, medical history  Review of Systems:  No other skin or systemic complaints except as noted in HPI or Assessment and Plan.  Objective  Well appearing patient in no apparent distress; mood and affect are within normal limits.  Areas Examined: Trunk, extremities  Relevant exam findings are noted in the Assessment and Plan.      Assessment & Plan     PSORIASIS Exam: Few small pink scaly patches of arms. <1% BSA.  Chronic and persistent condition with duration or expected duration over one year. Condition is symptomatic/ bothersome to patient. Not currently at goal. Pt has been off Cosentyx for 4 months- will restart  Joint aches were improved on Cosentyx.  Treatment Plan: Restart Cosentyx injections today (patient has medication at home). Advised patient to call for refill now so she has her next injection when she is ready for it. Continue clotrimazole troches prn mouth sores or Nystatin oral rinse  Counseling on psoriasis and coordination of care  psoriasis is a chronic non-curable, but treatable genetic/hereditary disease that may have other systemic features affecting other organ systems such as joints (Psoriatic Arthritis). It is associated with an increased risk of inflammatory bowel disease, heart disease, non-alcoholic  fatty liver disease, and depression.  Treatments include light and laser treatments; topical medications; and systemic medications including oral and injectables.  Recommend OTC Gold Bond Rapid Relief Anti-Itch cream (pramoxine + menthol), CeraVe Anti-itch cream or lotion (pramoxine), Sarna lotion (Original- menthol + camphor or Sensitive- pramoxine) or Eucerin 12 hour Itch Relief lotion (menthol) up to 3 times per day to areas on body that are itchy.  Will plan labs at follow up appointment.  Reviewed risks of biologics including immunosuppression, infections, injection site reaction, and failure to improve condition. Goal is control of skin condition, not cure.  Some older biologics such as Humira and Enbrel may slightly increase risk of malignancy and may worsen congestive heart failure.  Taltz and Cosentyx may cause inflammatory bowel disease to flare. The use of biologics requires long term medication management, including periodic office visits and monitoring of blood work.   Return in about 6 months (around 08/22/2023) for Psoriasis.  I, Joanie Coddington, CMA, am acting as scribe for Willeen Niece, MD .   Documentation: I have reviewed the above documentation for accuracy and completeness, and I agree with the above.  Willeen Niece, MD

## 2023-02-19 NOTE — Patient Instructions (Addendum)
Due to recent changes in healthcare laws, you may see results of your pathology and/or laboratory studies on MyChart before the doctors have had a chance to review them. We understand that in some cases there may be results that are confusing or concerning to you. Please understand that not all results are received at the same time and often the doctors may need to interpret multiple results in order to provide you with the best plan of care or course of treatment. Therefore, we ask that you please give [place] 2 business days to thoroughly review all your results before contacting the office for clarification. Should we see a critical lab result, you will be contacted sooner.   If You Need Anything After Your Visit  If you have any questions or concerns for your doctor, please call our main line at [phone] and press option 4 to reach your doctor's medical assistant. If no one answers, please leave a voicemail as directed and we will return your call as soon as possible. Messages left after 4 pm will be answered the following business day.   You may also send [place] a message via MyChart. We typically respond to MyChart messages within 1-2 business days.  For prescription refills, please ask your pharmacy to contact our office. Our fax number is [phone].  If you have an urgent issue when the clinic is closed that cannot wait until the next business day, you can page your doctor at the number below.    Please note that while we do our best to be available for urgent issues outside of office hours, we are not available 24/7.   If you have an urgent issue and are unable to reach [place], you may choose to seek medical care at your doctor's office, retail clinic, urgent care center, or emergency room.  If you have a medical emergency, please immediately call 911 or go to the emergency department.  Pager Numbers  - Dr. Gwen Pounds: [phone]  - Dr. Roseanne Reno: [phone]  - Dr. Katrinka Blazing: [phone]    In the event of inclement weather, please call our main line at [phone] for an update on the status of any delays or closures.  Dermatology Medication Tips: Please keep the boxes that topical medications come in in order to help keep track of the instructions about where and how to use these. Pharmacies typically print the medication instructions only on the boxes and not directly on the medication tubes.   If your medication is too expensive, please contact our office at [phone] option 4 or send [place] a message through MyChart.   We are unable to tell what your co-pay for medications will be in advance as this is different depending on your insurance coverage. However, we may be able to find a substitute medication at lower cost or fill out paperwork to get insurance to cover a needed medication.   If a prior authorization is required to get your medication covered by your insurance company, please allow [place] 1-2 business days to complete this process.  Drug prices often vary depending on where the prescription is filled and some pharmacies may offer cheaper prices.  The website www.goodrx.com contains coupons for medications through different pharmacies. The prices here do not account for what the cost may be with help from insurance (it may be cheaper with your insurance), but the website can give you the price if you did not use any insurance.  - You can print the associated coupon and take it  with your prescription to the pharmacy.  - You may also stop by our office during regular business hours and pick up a GoodRx coupon card.  - If you need your prescription sent electronically to a different pharmacy, notify our office through [facility] or by phone at [phone] option 4.     Si Usted Necesita Algo Despus de Su Visita  Tambin puede enviarnos un mensaje a travs de Clinical cytogeneticist. Por lo general respondemos a los mensajes de MyChart en el transcurso de 1 a 2 das  hbiles.  Para renovar recetas, por favor pida a su farmacia que se ponga en contacto con nuestra oficina. Annie Sable de fax es [place] [phone].  Si tiene un asunto urgente cuando la clnica est cerrada y que no puede esperar hasta el siguiente da hbil, puede llamar/localizar a su doctor(a) al nmero que aparece a continuacin.   Por favor, tenga en cuenta que aunque hacemos todo lo posible para estar disponibles para asuntos urgentes fuera del horario de [place], no estamos disponibles las 24 horas del da, los 7 [address]  [address] de la [place].   Si tiene un problema urgente y no puede comunicarse con nosotros, puede optar por buscar atencin mdica  en el consultorio de su doctor(a), en una clnica privada, en un centro de atencin urgente o en una sala de emergencias.  Si tiene Engineer, drilling, por favor llame inmediatamente al 911 o vaya a la sala de emergencias.  Nmeros de bper  - Dr. Gwen Pounds: [phone]  - Dra. Roseanne Reno: [phone]  - Dr. Katrinka Blazing: [phone]   En caso de inclemencias del tiempo, por favor llame a Lacy Duverney principal al [phone] para una actualizacin sobre el [place] de cualquier retraso o cierre.  Consejos para la medicacin en dermatologa: Por favor, guarde las cajas en las que vienen los medicamentos de uso tpico para ayudarle a seguir las instrucciones sobre dnde y cmo usarlos. Las farmacias generalmente imprimen las instrucciones del medicamento slo en las cajas y no directamente en los tubos del [place].   Si su medicamento es muy caro, por favor, pngase en contacto con Rolm Gala llamando al [phone] y presione la opcin 4 o envenos un mensaje a travs de Clinical cytogeneticist.  Recommend OTC [place] Rapid Relief Anti-Itch cream (pramoxine + menthol), CeraVe Anti-itch cream or lotion (pramoxine), Sarna lotion (Original- menthol + camphor or Sensitive- pramoxine) or Eucerin 12 hour Itch Relief lotion (menthol) up to 3 times per day to areas  on body that are itchy.    No podemos decirle cul ser su copago por los medicamentos por adelantado ya que esto es diferente dependiendo de la cobertura de su seguro. Sin embargo, es posible que podamos encontrar un medicamento sustituto a Audiological scientist un formulario para que el seguro cubra el medicamento que se considera necesario.   Si se requiere una autorizacin previa para que su compaa de seguros [place] su medicamento, por favor permtanos de 1 a 2 das hbiles para completar [address]  Los precios de los medicamentos varan con frecuencia dependiendo del Environmental consultant de dnde se surte la receta y alguna farmacias pueden ofrecer precios ms baratos.  El sitio web www.goodrx.com tiene cupones para medicamentos de Health and safety inspector. Los precios aqu no tienen en cuenta lo que podra costar con la ayuda del seguro (puede ser ms barato con su seguro), pero el sitio web puede darle el precio si no utiliz Tourist information centre manager.  - Puede imprimir el cupn correspondiente y llevarlo con su  receta a la farmacia.  - Tambin puede pasar por nuestra oficina durante el horario de atencin regular y Education officer, museum una tarjeta de cupones de GoodRx.  - Si necesita que su receta se enve electrnicamente a una farmacia diferente, informe a nuestra oficina a travs de MyChart de [place] o por telfono llamando al [phone] y presione la opcin 4.

## 2023-02-19 NOTE — Progress Notes (Signed)
Subjective:   Lindsay Tran is a 77 y.o. female who presents for Medicare Annual (Subsequent) preventive examination.  Visit Complete: Virtual  I connected with  Lindsay Tran on 02/19/23 by a audio enabled telemedicine application and verified that I am speaking with the correct person using two identifiers.  Patient Location: Home  Provider Location: Office/Clinic  I discussed the limitations of evaluation and management by telemedicine. The patient expressed understanding and agreed to proceed.  Vital Signs: Unable to obtain new vitals due to this being a telehealth visit.   Review of Systems     Cardiac Risk Factors include: advanced age (>82men, >76 women);dyslipidemia;hypertension;Other (see comment), Risk factor comments: CAD, Heart Murmur     Objective:    Today's Vitals   02/19/23 1535  BP: 110/62  Weight: 135 lb (61.2 kg)  Height: 5' (1.524 m)   Body mass index is 26.37 kg/m.     02/19/2023    3:56 PM 03/24/2022   12:08 PM 02/13/2022    3:35 PM 11/28/2021   10:46 AM 09/19/2021   11:06 AM 02/10/2021    7:51 AM 02/07/2021   12:44 PM  Advanced Directives  Does Patient Have a Medical Advance Directive? Yes Yes Yes Yes Yes Yes Yes  Type of Estate agent of Lindsay Tran;Living will Healthcare Power of [place];Living will Healthcare Power of [place];Living will Living will;Healthcare Power of [employer] Power of [place];Living will Healthcare Power of [place];Living will  Does patient want to make changes to medical advance directive?   No - Patient declined    No - Patient declined  Copy of Healthcare Power of Attorney in Chart? No - copy requested  Yes - validated most recent copy scanned in chart (See row information) No - copy requested  Yes - validated most recent copy scanned in chart (See row information) Yes - validated most recent copy scanned in chart (See row information)    Current Medications (verified) Outpatient Encounter  Medications as of 02/19/2023  Medication Sig   amLODipine (NORVASC) 2.5 MG tablet Take 1 tablet (2.5 mg total) by mouth daily.   busPIRone (BUSPAR) 7.5 MG tablet Take 15 mg by mouth 2 (two) times daily.   clobetasol ointment (TEMOVATE) 0.05 % Apply 1 application topically 2 (two) times daily as needed. For rash. Avoid face.   Cyanocobalamin (VITAMIN B 12 PO) Take by mouth daily.   estradiol (ESTRACE) 0.1 MG/GM vaginal cream 1 APPLICATORFUL TWICE WEEKLY   fluticasone (FLONASE) 50 MCG/ACT nasal spray Place 1 spray into both nostrils daily.   gabapentin (NEURONTIN) 300 MG capsule Take 300 mg by mouth 2 (two) times daily.   ketoconazole (NIZORAL) 2 % cream Apply under breasts once daily, twice daily with flares.   magnesium oxide (MAG-OX) 400 MG tablet Take 1 tablet by mouth daily.   metoprolol tartrate (LOPRESSOR) 100 MG tablet Take 100mg  (1 tablet)  in the morning and 50mg  (0.5 tablet)  in the evening.   Multiple Vitamins-Minerals (PRESERVISION AREDS 2 PO) Take by mouth daily.   pantoprazole (PROTONIX) 40 MG tablet Take 1 tablet (40 mg total) by mouth daily.   rosuvastatin (CRESTOR) 5 MG tablet Take 1 tablet (5 mg total) by mouth daily. Take 3 times a week.   triamcinolone ointment (KENALOG) 0.1 % Apply topically 2 (two) times daily. Use until rash improved. Avoid face, groin, underarms.   valACYclovir (VALTREX) 500 MG tablet Take 1 tablet (500 mg total) by mouth 2 (two) times daily.  clotrimazole (MYCELEX) 10 MG troche Take 1 tablet (10 mg total) by mouth 3 (three) times daily. For burning and film of mouth for oral thrush (Patient not taking: Reported on 02/19/2023)   Evolocumab (REPATHA SURECLICK) 140 MG/ML SOAJ Inject 140 mg into the skin every 14 (fourteen) days.   famotidine (PEPCID) 20 MG tablet TAKE 1 TABLET BY MOUTH EVERYDAY AT BEDTIME (Patient not taking: Reported on 02/19/2023)   fluconazole (DIFLUCAN) 200 MG tablet Take 2 tablets (400 mg total) by mouth daily. (Patient not taking:  Reported on 02/19/2023)   hyoscyamine (LEVSIN SL) 0.125 MG SL tablet Place 1 tablet (0.125 mg total) under the tongue every 4 (four) hours as needed. (Patient not taking: Reported on 02/19/2023)   Omega-3 Fatty Acids (OMEGA 3 500 PO) Take 1 capsule by mouth daily.  (Patient not taking: Reported on 02/19/2023)   Secukinumab, 300 MG Dose, (COSENTYX SENSOREADY, 300 MG,) 150 MG/ML SOAJ Inject 300 mg into the skin as directed. On week 0, 1, 2, 3 and 4.   No facility-administered encounter medications on file as of 02/19/2023.    Allergies (verified) Gluten meal   History: Past Medical History:  Diagnosis Date   Abdominal pain    Adjustment disorder with anxiety    Anemia    Anxiety    adjustment disorder with anxiety   Arthritis    Atrophic vaginitis    BPPV (benign paroxysmal positional vertigo)    CAD in native artery    Cataract    Chronic diarrhea    Chronic pain of right knee    Coronary artery disease    Dysphagia    Dysphasia    Dysuria    Family history of colon cancer    Gastric ulcer 1980's   Gastric ulcer    Gastroesophageal hernia    GERD (gastroesophageal reflux disease)    High cholesterol    Hyperlipidemia    Hypertension    Macular degeneration    Osteoporosis    Osteoporosis    post-menopausal   Pedal edema    Psoriasis    Schatzki's ring 05/02/2013   Schatzki's ring    Scoliosis    Scoliosis    UTI (urinary tract infection)    Past Surgical History:  Procedure Laterality Date   ABDOMINAL HYSTERECTOMY     CARDIAC CATHETERIZATION     CHOLECYSTECTOMY     COLONOSCOPY     COLONOSCOPY N/A 11/12/2014   Procedure: COLONOSCOPY;  Surgeon: Scot Jun, MD;  Location: [facility] ENDOSCOPY;  Service: Endoscopy;  Laterality: N/A;   COLONOSCOPY N/A 05/24/2020   Procedure: COLONOSCOPY;  Surgeon: Toledo, Boykin Nearing, MD;  Location: [facility] ENDOSCOPY;  Service: Endoscopy;  Laterality: N/A;   DIAGNOSTIC LAPAROSCOPY     paraesphogeal hernia   ESOPHAGOGASTRODUODENOSCOPY      ESOPHAGOGASTRODUODENOSCOPY (EGD) WITH PROPOFOL N/A 04/16/2017   Procedure: ESOPHAGOGASTRODUODENOSCOPY (EGD) WITH PROPOFOL;  Surgeon: Scot Jun, MD;  Location: [facility] ENDOSCOPY;  Service: Endoscopy;  Laterality: N/A;   ESOPHAGOGASTRODUODENOSCOPY (EGD) WITH PROPOFOL N/A 02/10/2021   Procedure: ESOPHAGOGASTRODUODENOSCOPY (EGD) WITH PROPOFOL;  Surgeon: Wyline Mood, MD;  Location: [facility] ENDOSCOPY;  Service: Gastroenterology;  Laterality: N/A;   ESOPHAGOGASTRODUODENOSCOPY (EGD) WITH PROPOFOL N/A 11/28/2021   Procedure: ESOPHAGOGASTRODUODENOSCOPY (EGD) WITH PROPOFOL;  Surgeon: Regis Bill, MD;  Location: [facility] ENDOSCOPY;  Service: Endoscopy;  Laterality: N/A;   GIVENS CAPSULE STUDY N/A 03/08/2021   Procedure: GIVENS CAPSULE STUDY;  Surgeon: Wyline Mood, MD;  Location: [facility] ENDOSCOPY;  Service: Gastroenterology;  Laterality: N/A;   HERNIA  REPAIR     inguinal hernia   HERNIA REPAIR     umbilical hernia   Family History  Problem Relation Age of Onset   Diabetes Mother    Parkinson's disease Mother    Stroke Mother    Heart disease Mother    Cancer Father        Pancreatic   Cancer Sister        Lung   Diabetes Sister    Diabetes Sister    Alzheimer's disease Sister    Tics Sister    Stroke Brother    Restless legs syndrome Brother    Diverticulosis Daughter    Alzheimer's disease Paternal Aunt        x2 sisters   Breast cancer Neg Hx    Social History   Socioeconomic History   Marital status: Single    Spouse name: Not on file   Number of children: Not on file   Years of education: Not on file   Highest education level: GED or equivalent  Occupational History   Not on file  Tobacco Use   Smoking status: Never   Smokeless tobacco: Never  Vaping Use   Vaping status: Never Used  Substance and Sexual Activity   Alcohol use: No    Alcohol/week: 0.0 standard drinks of alcohol   Drug use: No   Sexual activity: Never  Other Topics Concern   Not on file  Social History  Narrative   Divorced   Retired as a Occupational hygienist   12 + years of school    Social Determinants of Health   Financial Resource Strain: Low Risk  (02/19/2023)   Overall Financial Resource Strain (CARDIA)    Difficulty of Paying Living Expenses: Not hard at all  Food Insecurity: No Food Insecurity (02/19/2023)   Hunger Vital Sign    Worried About Running Out of Food in the Last Year: Never true    Ran Out of Food in the Last Year: Never true  Transportation Needs: No Transportation Needs (02/19/2023)   PRAPARE - Administrator, Civil Service (Medical): No    Lack of Transportation (Non-Medical): No  Physical Activity: Insufficiently Active (02/19/2023)   Exercise Vital Sign    Days of Exercise per Week: 3 days    Minutes of Exercise per Session: 30 min  Stress: No Stress Concern Present (02/19/2023)   [employer] of Occupational Health - Occupational Stress Questionnaire    Feeling of Stress : Not at all  Social Connections: Socially Isolated (02/19/2023)   Social Connection and Isolation Panel [NHANES]    Frequency of Communication with Friends and Family: Once a week    Frequency of Social Gatherings with Friends and Family: Once a week    Attends Religious Services: Never    Database administrator or Organizations: No    Attends Engineer, structural: Never    Marital Status: Divorced    Tobacco Counseling Counseling given: Not Answered   Clinical Intake:  Pre-visit preparation completed: Yes  Pain : No/denies pain     BMI - recorded: 26.37 Nutritional Status: BMI 25 -29 Overweight Nutritional Risks: None, Nausea/ vomitting/ diarrhea, Unintentional weight loss (diarrhea is normal for patient, problems with taste buds) Diabetes: No  How often do you need to have someone help you when you read instructions, pamphlets, or other written materials from your doctor or pharmacy?: 1 - Never  Interpreter Needed?: No  Information entered by :: R.    LPN   Activities of Daily Living    02/19/2023    3:40 PM 02/15/2023   10:28 AM  In your present state of health, do you have any difficulty performing the following activities:  Hearing? 1 0  Comment only in a crowd   Vision? 0 0  Comment glasses   Difficulty concentrating or making decisions? 1 0  Comment ADHD   Walking or climbing stairs? 1 1  Dressing or bathing? 0 0  Doing errands, shopping? 0 0  Preparing Food and eating ? N N  Using the Toilet? N N  In the past six months, have you accidently leaked urine? Y Y  Comment pads at times   Do you have problems with loss of bowel control? Y Y  Managing your Medications? N N  Managing your Finances? N N  Housekeeping or managing your Housekeeping? N N    Patient Care Team: Glori Luis, MD as PCP - General (Family Medicine) End, Cristal Deer, MD as PCP - Cardiology (Cardiology) Creig Hines, MD as Consulting Physician (Hematology and Oncology)  Indicate any recent Medical Services you may have received from other than [facility] providers in the past year (date may be approximate).     Assessment:   This is a routine wellness examination for Eura.  Hearing/Vision screen Hearing Screening - Comments:: Only in a crowd Vision Screening - Comments:: glasses  Dietary issues and exercise activities discussed:     Goals Addressed             This Visit's Progress    Patient Stated       Lose some weight       Depression Screen    02/19/2023    3:50 PM 01/12/2023   12:01 PM 09/27/2022   10:25 AM 06/26/2022   12:13 PM 02/13/2022    3:35 PM 12/20/2021   10:16 AM 06/21/2021   11:17 AM  PHQ 2/9 Scores  PHQ - 2 Score 2 0 0 1 0 0 0  PHQ- 9 Score 5 0  10       Fall Risk    02/19/2023    3:44 PM 02/15/2023   10:28 AM 01/12/2023   12:01 PM 09/27/2022   10:24 AM 02/13/2022    3:35 PM  Fall Risk   Falls in the past year? 0 0 0 0 0  Number falls in past yr: 0 0 0 0 0  Injury with Fall? 0 0 0 0   Risk for fall  due to : No Fall Risks  No Fall Risks No Fall Risks   Follow up Falls prevention discussed;Falls evaluation completed  Falls evaluation completed Falls evaluation completed Falls evaluation completed    MEDICARE RISK AT HOME:  Medicare Risk at Home - 02/19/23 1544     Any stairs in or around the home? No    If so, are there any without handrails? No    Home free of loose throw rugs in walkways, pet beds, electrical cords, etc? Yes    Adequate lighting in your home to reduce risk of falls? Yes    Life alert? No    Use of a cane, walker or w/c? Yes    Grab bars in the bathroom? Yes    Shower chair or bench in shower? Yes    Elevated toilet seat or a handicapped toilet? Yes             Cognitive Function:    09/29/2016  10:34 AM 09/30/2015   10:43 AM  MMSE - Mini Mental State Exam  Orientation to time 5 5  Orientation to Place 5 5  Registration 3 3  Attention/ Calculation 5 5  Recall 3 3  Language- name 2 objects 2 2  Language- repeat 1 1  Language- follow 3 step command 3 3  Language- read & follow direction 1 1  Write a sentence 1 1  Copy design 1 1  Total score 30 30        02/19/2023    3:57 PM 02/13/2022    3:44 PM 02/07/2021   12:53 PM 01/16/2020    2:25 PM 01/15/2019   10:24 AM  6CIT Screen  What Year? 0 points 0 points 0 points 0 points 0 points  What month? 0 points 0 points 0 points 0 points 0 points  What time? 0 points 0 points 0 points 0 points 0 points  Count back from 20 0 points 0 points 0 points  0 points  Months in reverse 0 points 0 points 0 points 0 points 0 points  Repeat phrase 0 points 0 points 0 points 0 points 0 points  Total Score 0 points 0 points 0 points  0 points    Immunizations Immunization History  Administered Date(s) Administered   Covid-19, Mrna,Vaccine(Spikevax)51yrs and older 03/31/2022   Fluad Quad(high Dose 65+) 03/31/2019, 03/27/2022   Influenza, High Dose Seasonal PF 03/31/2016, 04/06/2017, 03/27/2018, 03/29/2018,  03/30/2020, 04/01/2021   Influenza-Unspecified 04/14/2015   PFIZER Comirnaty(Gray Top)Covid-19 Tri-Sucrose Vaccine 10/12/2020   PFIZER(Purple Top)SARS-COV-2 Vaccination 07/28/2019, 08/18/2019   PNEUMOCOCCAL CONJUGATE-20 06/26/2022   Pfizer Covid-19 Vaccine Bivalent Booster 20yrs & up 04/06/2021   Pneumococcal Conjugate-13 09/30/2015   Pneumococcal Polysaccharide-23 06/15/2010   Pneumococcal-Unspecified 07/17/2011   Tdap 07/21/2015   Zoster Recombinant(Shingrix) 07/26/2021, 11/08/2021   Zoster, Live 08/17/2011    TDAP status: Up to date  Flu Vaccine status: Up to date  Pneumococcal vaccine status: Up to date  Covid-19 vaccine status: Completed vaccines  Qualifies for Shingles Vaccine? Yes   Zostavax completed Yes   Shingrix Completed?: Yes  Screening Tests Health Maintenance  Topic Date Due   COVID-19 Vaccine (6 - 2023-24 season) 05/26/2022   Medicare Annual Wellness (AWV)  02/14/2023   INFLUENZA VACCINE  10/08/2023 (Originally 02/08/2023)   DTaP/Tdap/Td (2 - Td or Tdap) 07/20/2025   Pneumonia Vaccine 71+ Years old  Completed   DEXA SCAN  Completed   Hepatitis C Screening  Completed   Zoster Vaccines- Shingrix  Completed   HPV VACCINES  Aged Out   Colonoscopy  Discontinued    Health Maintenance  Health Maintenance Due  Topic Date Due   COVID-19 Vaccine (6 - 2023-24 season) 05/26/2022   Medicare Annual Wellness (AWV)  02/14/2023    Colorectal cancer screening: No longer required.   Mammogram status: Completed 11/23. Repeat every year  Bone Density status: Completed 4/17. Results reflect: Bone density results: OSTEOPOROSIS. Repeat every 2 years. Will discuss with PCP after getting other health issues taken care of  Lung Cancer Screening: (Low Dose CT Chest recommended if Age 66-80 years, 20 pack-year currently smoking OR have quit w/in 15years.) does not qualify.   Additional Screening:  Hepatitis C Screening: does qualify; Completed 10/23  Vision Screening:  Recommended annual ophthalmology exams for early detection of glaucoma and other disorders of the eye. Is the patient up to date with their annual eye exam?  Yes  Who is the provider or what is the name of  the office in which the patient attends annual eye exams? [facility] If pt is not established with a provider, would they like to be referred to a provider to establish care? No .   Dental Screening: Recommended annual dental exams for proper oral hygiene   Community Resource Referral / Chronic Care Management: CRR required this visit?  No   CCM required this visit?  No     Plan:     I have personally reviewed and noted the following in the patient's chart:   Medical and social history Use of alcohol, tobacco or illicit drugs  Current medications and supplements including opioid prescriptions. Patient is not currently taking opioid prescriptions. Functional ability and status Nutritional status Physical activity Advanced directives List of other physicians Hospitalizations, surgeries, and ER visits in previous 12 months Vitals Screenings to include cognitive, depression, and falls Referrals and appointments  In addition, I have reviewed and discussed with patient certain preventive protocols, quality metrics, and best practice recommendations. A written personalized care plan for preventive services as well as general preventive health recommendations were provided to patient.     Sydell Axon, LPN   6/96/2952   After Visit Summary: (MyChart) Due to this being a telephonic visit, the after visit summary with patients personalized plan was offered to patient via MyChart   Nurse Notes: None

## 2023-02-27 DIAGNOSIS — R1310 Dysphagia, unspecified: Secondary | ICD-10-CM | POA: Diagnosis not present

## 2023-02-27 DIAGNOSIS — K219 Gastro-esophageal reflux disease without esophagitis: Secondary | ICD-10-CM | POA: Diagnosis not present

## 2023-02-27 DIAGNOSIS — K224 Dyskinesia of esophagus: Secondary | ICD-10-CM | POA: Diagnosis not present

## 2023-02-27 DIAGNOSIS — K580 Irritable bowel syndrome with diarrhea: Secondary | ICD-10-CM | POA: Diagnosis not present

## 2023-02-27 DIAGNOSIS — K222 Esophageal obstruction: Secondary | ICD-10-CM | POA: Diagnosis not present

## 2023-02-27 DIAGNOSIS — K9089 Other intestinal malabsorption: Secondary | ICD-10-CM | POA: Diagnosis not present

## 2023-02-28 DIAGNOSIS — H2513 Age-related nuclear cataract, bilateral: Secondary | ICD-10-CM | POA: Diagnosis not present

## 2023-02-28 DIAGNOSIS — H353131 Nonexudative age-related macular degeneration, bilateral, early dry stage: Secondary | ICD-10-CM | POA: Diagnosis not present

## 2023-02-28 DIAGNOSIS — H16223 Keratoconjunctivitis sicca, not specified as Sjogren's, bilateral: Secondary | ICD-10-CM | POA: Diagnosis not present

## 2023-03-02 ENCOUNTER — Other Ambulatory Visit: Payer: Self-pay | Admitting: Otolaryngology

## 2023-03-02 DIAGNOSIS — R439 Unspecified disturbances of smell and taste: Secondary | ICD-10-CM | POA: Diagnosis not present

## 2023-03-02 DIAGNOSIS — J310 Chronic rhinitis: Secondary | ICD-10-CM | POA: Diagnosis not present

## 2023-03-08 DIAGNOSIS — G629 Polyneuropathy, unspecified: Secondary | ICD-10-CM | POA: Diagnosis not present

## 2023-03-08 DIAGNOSIS — R29818 Other symptoms and signs involving the nervous system: Secondary | ICD-10-CM | POA: Diagnosis not present

## 2023-03-08 DIAGNOSIS — R251 Tremor, unspecified: Secondary | ICD-10-CM | POA: Diagnosis not present

## 2023-03-08 DIAGNOSIS — K580 Irritable bowel syndrome with diarrhea: Secondary | ICD-10-CM | POA: Diagnosis not present

## 2023-03-13 DIAGNOSIS — H2513 Age-related nuclear cataract, bilateral: Secondary | ICD-10-CM | POA: Diagnosis not present

## 2023-03-13 DIAGNOSIS — H353131 Nonexudative age-related macular degeneration, bilateral, early dry stage: Secondary | ICD-10-CM | POA: Diagnosis not present

## 2023-03-21 ENCOUNTER — Other Ambulatory Visit: Payer: Self-pay | Admitting: Cardiology

## 2023-03-27 ENCOUNTER — Ambulatory Visit
Admission: RE | Admit: 2023-03-27 | Discharge: 2023-03-27 | Disposition: A | Discharge status: Home / Self Care | Payer: PPO | Source: Ambulatory Visit | Attending: Otolaryngology | Admitting: Otolaryngology

## 2023-03-27 DIAGNOSIS — R439 Unspecified disturbances of smell and taste: Secondary | ICD-10-CM

## 2023-03-27 MED ORDER — GADOPICLENOL 0.5 MMOL/ML IV SOLN
7.5000 mL | Freq: Once | INTRAVENOUS | Status: AC | PRN
  Administered 2023-03-27: 7 mL via INTRAVENOUS

## 2023-04-07 ENCOUNTER — Other Ambulatory Visit: Payer: Self-pay | Admitting: Internal Medicine

## 2023-04-16 ENCOUNTER — Other Ambulatory Visit
Admission: RE | Admit: 2023-04-16 | Discharge: 2023-04-16 | Disposition: A | Discharge status: Home / Self Care | Payer: PPO | Attending: Cardiology | Admitting: Cardiology

## 2023-04-16 DIAGNOSIS — E782 Mixed hyperlipidemia: Secondary | ICD-10-CM | POA: Insufficient documentation

## 2023-04-16 LAB — LIPID PANEL
Cholesterol: 147 mg/dL (ref 0–200)
HDL: 50 mg/dL (ref >40)
LDL Cholesterol: 64 mg/dL (ref 0–99)
Total CHOL/HDL Ratio: 2.9 {ratio}
Triglycerides: 163 mg/dL — ABNORMAL HIGH (ref <150)
VLDL: 33 mg/dL (ref 0–40)

## 2023-04-17 NOTE — Progress Notes (Signed)
LDL remains at goal at 64. Large increase since stopping Repatha.  Continue with rosuvastatin at minimum of 3 days weekly and if you are tolerating it 3 days a week try increasing to 4 days and then if still too elevated to 5 days weekly.  Recommend rechecking a lipid panel again in 6 months.

## 2023-04-19 ENCOUNTER — Other Ambulatory Visit: Payer: Self-pay | Admitting: Family Medicine

## 2023-04-19 DIAGNOSIS — E785 Hyperlipidemia, unspecified: Secondary | ICD-10-CM

## 2023-04-19 DIAGNOSIS — I2510 Atherosclerotic heart disease of native coronary artery without angina pectoris: Secondary | ICD-10-CM

## 2023-04-23 ENCOUNTER — Other Ambulatory Visit: Payer: Self-pay | Admitting: Family Medicine

## 2023-04-23 DIAGNOSIS — Z1231 Encounter for screening mammogram for malignant neoplasm of breast: Secondary | ICD-10-CM

## 2023-04-24 ENCOUNTER — Encounter: Payer: Self-pay | Admitting: Family Medicine

## 2023-04-24 ENCOUNTER — Ambulatory Visit (INDEPENDENT_AMBULATORY_CARE_PROVIDER_SITE_OTHER): Payer: PPO | Admitting: Family Medicine

## 2023-04-24 VITALS — BP 124/74 | HR 56 | Temp 97.9°F | Ht 60.0 in | Wt 137.4 lb

## 2023-04-24 DIAGNOSIS — R109 Unspecified abdominal pain: Secondary | ICD-10-CM

## 2023-04-24 DIAGNOSIS — R251 Tremor, unspecified: Secondary | ICD-10-CM | POA: Insufficient documentation

## 2023-04-24 DIAGNOSIS — M7918 Myalgia, other site: Secondary | ICD-10-CM | POA: Insufficient documentation

## 2023-04-24 LAB — COMPREHENSIVE METABOLIC PANEL
ALT: 15 U/L (ref 0–35)
AST: 22 U/L (ref 0–37)
Albumin: 4 g/dL (ref 3.5–5.2)
Alkaline Phosphatase: 63 U/L (ref 39–117)
BUN: 11 mg/dL (ref 6–23)
CO2: 29 meq/L (ref 19–32)
Calcium: 9.4 mg/dL (ref 8.4–10.5)
Chloride: 101 meq/L (ref 96–112)
Creatinine, Ser: 0.59 mg/dL (ref 0.40–1.20)
GFR: 85.55 mL/min (ref >60.00)
Glucose, Bld: 94 mg/dL (ref 70–99)
Potassium: 4.2 meq/L (ref 3.5–5.1)
Sodium: 138 meq/L (ref 135–145)
Total Bilirubin: 0.6 mg/dL (ref 0.2–1.2)
Total Protein: 7 g/dL (ref 6.0–8.3)

## 2023-04-24 LAB — CBC WITH DIFFERENTIAL/PLATELET
Basophils Absolute: 0 10*3/uL (ref 0.0–0.1)
Basophils Relative: 0.5 % (ref 0.0–3.0)
Eosinophils Absolute: 0.2 10*3/uL (ref 0.0–0.7)
Eosinophils Relative: 2.7 % (ref 0.0–5.0)
HCT: 40.2 % (ref 36.0–46.0)
Hemoglobin: 12.9 g/dL (ref 12.0–15.0)
Lymphocytes Relative: 23.3 % (ref 12.0–46.0)
Lymphs Abs: 1.4 10*3/uL (ref 0.7–4.0)
MCHC: 32.2 g/dL (ref 30.0–36.0)
MCV: 89.1 fL (ref 78.0–100.0)
Monocytes Absolute: 0.6 10*3/uL (ref 0.1–1.0)
Monocytes Relative: 9.4 % (ref 3.0–12.0)
Neutro Abs: 4 10*3/uL (ref 1.4–7.7)
Neutrophils Relative %: 64.1 % (ref 43.0–77.0)
Platelets: 306 10*3/uL (ref 150.0–400.0)
RBC: 4.51 Mil/uL (ref 3.87–5.11)
RDW: 13 % (ref 11.5–15.5)
WBC: 6.2 10*3/uL (ref 4.0–10.5)

## 2023-04-24 LAB — POCT URINALYSIS DIPSTICK
Bilirubin, UA: NEGATIVE
Blood, UA: NEGATIVE
Glucose, UA: NEGATIVE
Ketones, UA: NEGATIVE
Leukocytes, UA: NEGATIVE
Nitrite, UA: NEGATIVE
Protein, UA: NEGATIVE
Spec Grav, UA: 1.015 (ref 1.010–1.025)
Urobilinogen, UA: 0.2 U/dL
pH, UA: 7.5 (ref 5.0–8.0)

## 2023-04-24 LAB — TSH: TSH: 2 u[IU]/mL (ref 0.35–5.50)

## 2023-04-24 NOTE — Progress Notes (Unsigned)
Marikay Alar, MD Phone: [phone]  Lindsay Tran is a 77 y.o. female who presents today for same-day visit.  Right side pain: Patient notes this has been going on a while though it is worse recently.  Notes pain in her right side going to her right back.  She notes no numbness.  No new weakness.  No new incontinence issues.  No nausea or vomiting.  No hematuria.  No dysuria.  She does have some urinary frequency and urgency.  She has been taking some NSAIDs with some benefit.  She also reports her tremor has increased.  Social History   Tobacco Use  Smoking Status Never  Smokeless Tobacco Never    Current Outpatient Medications on File Prior to Visit  Medication Sig Dispense Refill   amLODipine (NORVASC) 2.5 MG tablet TAKE 1 TABLET BY MOUTH EVERY DAY 90 tablet 3   busPIRone (BUSPAR) 7.5 MG tablet Take 15 mg by mouth 2 (two) times daily.     clobetasol ointment (TEMOVATE) 0.05 % Apply 1 application topically 2 (two) times daily as needed. For rash. Avoid face. 180 g 1   clotrimazole (MYCELEX) 10 MG troche Take 1 tablet (10 mg total) by mouth 3 (three) times daily. For burning and film of mouth for oral thrush 90 Troche 3   Cyanocobalamin (VITAMIN B 12 PO) Take by mouth daily.     estradiol (ESTRACE) 0.1 MG/GM vaginal cream 1 APPLICATORFUL TWICE WEEKLY 127.5 g 1   Evolocumab (REPATHA SURECLICK) 140 MG/ML SOAJ INJECT 140 MG INTO THE SKIN EVERY 14 (FOURTEEN) DAYS. 280 mL 2   famotidine (PEPCID) 20 MG tablet TAKE 1 TABLET BY MOUTH EVERYDAY AT BEDTIME 90 tablet 1   fluconazole (DIFLUCAN) 200 MG tablet Take 2 tablets (400 mg total) by mouth daily. 7 tablet 0   fluticasone (FLONASE) 50 MCG/ACT nasal spray Place 1 spray into both nostrils daily.     gabapentin (NEURONTIN) 300 MG capsule Take 300 mg by mouth 2 (two) times daily.     hyoscyamine (LEVSIN SL) 0.125 MG SL tablet Place 1 tablet (0.125 mg total) under the tongue every 4 (four) hours as needed. 30 tablet 0   ketoconazole  (NIZORAL) 2 % cream Apply under breasts once daily, twice daily with flares. 60 g 3   magnesium oxide (MAG-OX) 400 MG tablet Take 1 tablet by mouth daily.     metoprolol tartrate (LOPRESSOR) 100 MG tablet TAKE 100MG  (1 TABLET) IN THE MORNING AND 50MG  (0.5 TABLET) IN THE EVENING. 135 tablet 3   Multiple Vitamins-Minerals (PRESERVISION AREDS 2 PO) Take by mouth daily.     Omega-3 Fatty Acids (OMEGA 3 500 PO) Take 1 capsule by mouth daily.     pantoprazole (PROTONIX) 40 MG tablet Take 1 tablet (40 mg total) by mouth daily. 90 tablet 3   rosuvastatin (CRESTOR) 5 MG tablet TAKE 1 TABLET BY MOUTH 3 TIMES PER WEEK 12 tablet 0   Secukinumab, 300 MG Dose, (COSENTYX SENSOREADY, 300 MG,) 150 MG/ML SOAJ Inject 300 mg into the skin as directed. On week 0, 1, 2, 3 and 4. 10 mL 0   triamcinolone ointment (KENALOG) 0.1 % Apply topically 2 (two) times daily. Use until rash improved. Avoid face, groin, underarms. 240 g 0   valACYclovir (VALTREX) 500 MG tablet Take 1 tablet (500 mg total) by mouth 2 (two) times daily. 60 tablet 2   No current facility-administered medications on file prior to visit.     ROS see history of present illness  Objective  Physical Exam Vitals:   04/24/23 1105  BP: 124/74  Pulse: (!) 56  Temp: 97.9 F (36.6 C)  SpO2: 97%    BP Readings from Last 3 Encounters:  04/24/23 124/74  02/19/23 110/62  02/19/23 110/67   Wt Readings from Last 3 Encounters:  04/24/23 137 lb 6.4 oz (62.3 kg)  02/19/23 135 lb (61.2 kg)  02/08/23 136 lb (61.7 kg)    Physical Exam Constitutional:      General: She is not in acute distress.    Appearance: She is not diaphoretic.  Cardiovascular:     Rate and Rhythm: Normal rate and regular rhythm.     Heart sounds: Normal heart sounds.  Pulmonary:     Effort: Pulmonary effort is normal.     Breath sounds: Normal breath sounds.  Abdominal:     General: Bowel sounds are normal. There is no distension.     Palpations: Abdomen is soft.      Tenderness: There is no abdominal tenderness.    Musculoskeletal:       Back:  Skin:    General: Skin is warm and dry.  Neurological:     Mental Status: She is alert.      Assessment/Plan: Please see individual problem list.  Musculoskeletal pain Assessment & Plan: Suspect muscular strain as the cause of her symptoms.  Discussed where she is tender are muscular areas.  We will check lab work and urinalysis to evaluate for other underlying causes.  Once those return we could consider with anti-inflammatories versus having her do physical therapy.   Flank pain -     CBC with Differential/Platelet -     Comprehensive metabolic panel -     POCT urinalysis dipstick  Tremor Assessment & Plan: Likely related to essential tremor though her tremor has worsened recently.  This could be related to pain.  We will check a TSH today to evaluate for underlying thyroid dysfunction.  Orders: -     TSH    Return in about 4 weeks (around 05/22/2023) for flank pain.   Marikay Alar, MD [facility]

## 2023-04-24 NOTE — Assessment & Plan Note (Signed)
Likely related to essential tremor though her tremor has worsened recently.  This could be related to pain.  We will check a TSH today to evaluate for underlying thyroid dysfunction.

## 2023-04-24 NOTE — Assessment & Plan Note (Signed)
Suspect muscular strain as the cause of her symptoms.  Discussed where she is tender are muscular areas.  We will check lab work and urinalysis to evaluate for other underlying causes.  Once those return we could consider with anti-inflammatories versus having her do physical therapy.

## 2023-04-25 DIAGNOSIS — I2510 Atherosclerotic heart disease of native coronary artery without angina pectoris: Secondary | ICD-10-CM | POA: Diagnosis not present

## 2023-04-25 DIAGNOSIS — M419 Scoliosis, unspecified: Secondary | ICD-10-CM | POA: Diagnosis not present

## 2023-04-25 DIAGNOSIS — R1314 Dysphagia, pharyngoesophageal phase: Secondary | ICD-10-CM | POA: Diagnosis not present

## 2023-04-25 DIAGNOSIS — Z9889 Other specified postprocedural states: Secondary | ICD-10-CM | POA: Diagnosis not present

## 2023-04-25 DIAGNOSIS — J9811 Atelectasis: Secondary | ICD-10-CM | POA: Diagnosis not present

## 2023-04-25 DIAGNOSIS — I700 Atherosclerosis of aorta: Secondary | ICD-10-CM | POA: Diagnosis not present

## 2023-04-25 DIAGNOSIS — R1310 Dysphagia, unspecified: Secondary | ICD-10-CM | POA: Diagnosis not present

## 2023-04-25 DIAGNOSIS — K224 Dyskinesia of esophagus: Secondary | ICD-10-CM | POA: Diagnosis not present

## 2023-04-25 DIAGNOSIS — K2289 Other specified disease of esophagus: Secondary | ICD-10-CM | POA: Diagnosis not present

## 2023-04-25 DIAGNOSIS — K449 Diaphragmatic hernia without obstruction or gangrene: Secondary | ICD-10-CM | POA: Diagnosis not present

## 2023-04-25 DIAGNOSIS — K219 Gastro-esophageal reflux disease without esophagitis: Secondary | ICD-10-CM | POA: Diagnosis not present

## 2023-04-25 DIAGNOSIS — I10 Essential (primary) hypertension: Secondary | ICD-10-CM | POA: Diagnosis not present

## 2023-04-27 ENCOUNTER — Other Ambulatory Visit: Payer: Self-pay | Admitting: Family Medicine

## 2023-04-27 MED ORDER — PREDNISONE 10 MG PO TABS: ORAL_TABLET | ORAL | 0 refills | Status: AC

## 2023-05-03 ENCOUNTER — Other Ambulatory Visit: Payer: Self-pay | Admitting: Medical Genetics

## 2023-05-03 DIAGNOSIS — Z006 Encounter for examination for normal comparison and control in clinical research program: Secondary | ICD-10-CM

## 2023-05-11 ENCOUNTER — Other Ambulatory Visit: Payer: PPO

## 2023-05-18 ENCOUNTER — Encounter: Payer: Self-pay | Admitting: Oncology

## 2023-05-21 DIAGNOSIS — R430 Anosmia: Secondary | ICD-10-CM | POA: Diagnosis not present

## 2023-05-21 DIAGNOSIS — R1310 Dysphagia, unspecified: Secondary | ICD-10-CM | POA: Diagnosis not present

## 2023-05-22 ENCOUNTER — Encounter: Payer: Self-pay | Admitting: Family Medicine

## 2023-05-22 ENCOUNTER — Ambulatory Visit (INDEPENDENT_AMBULATORY_CARE_PROVIDER_SITE_OTHER): Payer: PPO | Admitting: Family Medicine

## 2023-05-22 ENCOUNTER — Other Ambulatory Visit: Payer: PPO | Attending: Medical Genetics

## 2023-05-22 VITALS — BP 120/72 | HR 58 | Temp 98.3°F | Ht 60.0 in | Wt 137.8 lb

## 2023-05-22 DIAGNOSIS — R1310 Dysphagia, unspecified: Secondary | ICD-10-CM

## 2023-05-22 DIAGNOSIS — N644 Mastodynia: Secondary | ICD-10-CM | POA: Diagnosis not present

## 2023-05-22 DIAGNOSIS — M7918 Myalgia, other site: Secondary | ICD-10-CM

## 2023-05-22 NOTE — Assessment & Plan Note (Signed)
Symptoms have resolved.  Monitor for recurrence. 

## 2023-05-22 NOTE — Assessment & Plan Note (Signed)
Chronic issue.  Diagnostic mammogram and ultrasound ordered today.  Discussed that this could be a breast issue or a chest wall issue.  She will follow-up with me as scheduled old in January for recheck.

## 2023-05-22 NOTE — Assessment & Plan Note (Signed)
Chronic issue.  I encouraged her to contact GI to see if they had a sooner appointment for her to discuss treatment options for this issue.

## 2023-05-22 NOTE — Progress Notes (Signed)
Marikay Alar, MD Phone: [phone]  Lindsay Tran is a 77 y.o. female who presents today for follow-up.  Flank pain: Patient notes this has resolved.  She notes her blood pressure went way up on the steroid taper.  Right breast/chest wall pain: Patient notes soreness that she feels as though it is under her breast.  She notes she had this checked several years ago and there is nothing wrong with her breast.  She notes this occurs on a monthly basis though has been more prominent over the past few weeks.  Dysphagia: Patient recently saw GI and reports having had an endoscopy.  She reports GI discussed putting her on duloxetine for possible anxiety component of this.  Per review of the phone note from GI it appears they mentioned possibly doing manometry and several other treatment options as well.  Social History   Tobacco Use  Smoking Status Never  Smokeless Tobacco Never    Current Outpatient Medications on File Prior to Visit  Medication Sig Dispense Refill   amLODipine (NORVASC) 2.5 MG tablet TAKE 1 TABLET BY MOUTH EVERY DAY 90 tablet 3   busPIRone (BUSPAR) 7.5 MG tablet Take 15 mg by mouth 2 (two) times daily.     clobetasol ointment (TEMOVATE) 0.05 % Apply 1 application topically 2 (two) times daily as needed. For rash. Avoid face. 180 g 1   clotrimazole (MYCELEX) 10 MG troche Take 1 tablet (10 mg total) by mouth 3 (three) times daily. For burning and film of mouth for oral thrush 90 Troche 3   Cyanocobalamin (VITAMIN B 12 PO) Take by mouth daily.     estradiol (ESTRACE) 0.1 MG/GM vaginal cream 1 APPLICATORFUL TWICE WEEKLY 127.5 g 1   Evolocumab (REPATHA SURECLICK) 140 MG/ML SOAJ INJECT 140 MG INTO THE SKIN EVERY 14 (FOURTEEN) DAYS. 280 mL 2   famotidine (PEPCID) 20 MG tablet TAKE 1 TABLET BY MOUTH EVERYDAY AT BEDTIME 90 tablet 1   fluconazole (DIFLUCAN) 200 MG tablet Take 2 tablets (400 mg total) by mouth daily. 7 tablet 0   fluticasone (FLONASE) 50 MCG/ACT nasal spray  Place 1 spray into both nostrils daily.     gabapentin (NEURONTIN) 300 MG capsule Take 300 mg by mouth 2 (two) times daily.     hyoscyamine (LEVSIN SL) 0.125 MG SL tablet Place 1 tablet (0.125 mg total) under the tongue every 4 (four) hours as needed. 30 tablet 0   ketoconazole (NIZORAL) 2 % cream Apply under breasts once daily, twice daily with flares. 60 g 3   magnesium oxide (MAG-OX) 400 MG tablet Take 1 tablet by mouth daily.     metoprolol tartrate (LOPRESSOR) 100 MG tablet TAKE 100MG  (1 TABLET) IN THE MORNING AND 50MG  (0.5 TABLET) IN THE EVENING. 135 tablet 3   Multiple Vitamins-Minerals (PRESERVISION AREDS 2 PO) Take by mouth daily.     Omega-3 Fatty Acids (OMEGA 3 500 PO) Take 1 capsule by mouth daily.     pantoprazole (PROTONIX) 40 MG tablet Take 1 tablet (40 mg total) by mouth daily. 90 tablet 3   rosuvastatin (CRESTOR) 5 MG tablet TAKE 1 TABLET BY MOUTH 3 TIMES PER WEEK 12 tablet 0   Secukinumab, 300 MG Dose, (COSENTYX SENSOREADY, 300 MG,) 150 MG/ML SOAJ Inject 300 mg into the skin as directed. On week 0, 1, 2, 3 and 4. 10 mL 0   triamcinolone ointment (KENALOG) 0.1 % Apply topically 2 (two) times daily. Use until rash improved. Avoid face, groin, underarms. 240 g 0  valACYclovir (VALTREX) 500 MG tablet Take 1 tablet (500 mg total) by mouth 2 (two) times daily. 60 tablet 2   No current facility-administered medications on file prior to visit.     ROS see history of present illness  Objective  Physical Exam Vitals:   05/22/23 1053  BP: 120/72  Pulse: (!) 58  Temp: 98.3 F (36.8 C)  SpO2: 97%    BP Readings from Last 3 Encounters:  05/22/23 120/72  04/24/23 124/74  02/19/23 110/62   Wt Readings from Last 3 Encounters:  05/22/23 137 lb 12.8 oz (62.5 kg)  04/24/23 137 lb 6.4 oz (62.3 kg)  02/19/23 135 lb (61.2 kg)    Physical Exam Chest:       Comments: Prince Solian, CMA served as chaperone, tenderness as outlined above, no other tenderness in either  breast, no masses in either breast, no nipple inversion in either breast, no axillary masses bilaterally     Assessment/Plan: Please see individual problem list.  Breast pain, right Assessment & Plan: Chronic issue.  Diagnostic mammogram and ultrasound ordered today.  Discussed that this could be a breast issue or a chest wall issue.  She will follow-up with me as scheduled old in January for recheck.  Orders: -     MM 3D DIAGNOSTIC MAMMOGRAM BILATERAL BREAST; Future -     [place] LIMITED ULTRASOUND INCLUDING AXILLA RIGHT BREAST; Future -     [place] LIMITED ULTRASOUND INCLUDING AXILLA LEFT BREAST ; Future  Dysphagia, unspecified type Assessment & Plan: Chronic issue.  I encouraged her to contact GI to see if they had a sooner appointment for her to discuss treatment options for this issue.   Musculoskeletal pain Assessment & Plan: Symptoms have resolved.  Monitor for recurrence.      Return for as scheduled.   Marikay Alar, MD [facility]

## 2023-05-22 NOTE — Patient Instructions (Signed)
Nice to call you. The breast center should contact you to schedule your mammogram.  If they do not contact you in the next week or so please let us know. I would suggest contacting your GI doctor to see if he can be seen sooner given that you are continuing to have symptoms.

## 2023-06-01 ENCOUNTER — Ambulatory Visit
Admission: RE | Admit: 2023-06-01 | Discharge: 2023-06-01 | Disposition: A | Discharge status: Home / Self Care | Payer: PPO | Source: Ambulatory Visit | Attending: Family Medicine | Admitting: Family Medicine

## 2023-06-01 DIAGNOSIS — N644 Mastodynia: Secondary | ICD-10-CM | POA: Diagnosis not present

## 2023-06-01 DIAGNOSIS — R92323 Mammographic fibroglandular density, bilateral breasts: Secondary | ICD-10-CM | POA: Diagnosis not present

## 2023-07-09 ENCOUNTER — Other Ambulatory Visit: Payer: Self-pay | Admitting: Internal Medicine

## 2023-07-15 ENCOUNTER — Other Ambulatory Visit: Payer: Self-pay | Admitting: Family Medicine

## 2023-07-15 DIAGNOSIS — I2510 Atherosclerotic heart disease of native coronary artery without angina pectoris: Secondary | ICD-10-CM

## 2023-07-15 DIAGNOSIS — E785 Hyperlipidemia, unspecified: Secondary | ICD-10-CM

## 2023-07-16 ENCOUNTER — Ambulatory Visit (INDEPENDENT_AMBULATORY_CARE_PROVIDER_SITE_OTHER): Payer: PPO | Admitting: Family Medicine

## 2023-07-16 VITALS — BP 130/78 | HR 61 | Temp 97.6°F | Ht <= 58 in | Wt 139.4 lb

## 2023-07-16 DIAGNOSIS — K219 Gastro-esophageal reflux disease without esophagitis: Secondary | ICD-10-CM | POA: Diagnosis not present

## 2023-07-16 DIAGNOSIS — I2510 Atherosclerotic heart disease of native coronary artery without angina pectoris: Secondary | ICD-10-CM

## 2023-07-16 DIAGNOSIS — I10 Essential (primary) hypertension: Secondary | ICD-10-CM

## 2023-07-16 DIAGNOSIS — E785 Hyperlipidemia, unspecified: Secondary | ICD-10-CM

## 2023-07-16 MED ORDER — ESTRADIOL 0.1 MG/GM VA CREA: TOPICAL_CREAM | VAGINAL | 1 refills | Status: DC

## 2023-07-16 MED ORDER — REPATHA SURECLICK 140 MG/ML ~~LOC~~ SOAJ
140.0000 mg | SUBCUTANEOUS | 2 refills | Status: DC
Start: 2023-07-16 — End: 2024-03-06

## 2023-07-16 NOTE — Assessment & Plan Note (Signed)
 Chronic issue.  Stable.  She will continue Crestor 5 mg 3 times a week and Repatha 140 mg every 14 days.

## 2023-07-16 NOTE — Assessment & Plan Note (Signed)
 Chronic issue.  Adequately controlled for age.  Patient will continue amlodipine 2.5 mg daily and metoprolol 100 mg in the morning and 50 mg in the evening.

## 2023-07-16 NOTE — Assessment & Plan Note (Signed)
 Chronic issue.  Improved with lifestyle changes.  Patient will continue Protonix 40 mg daily and continue the measures as advised by ENT.

## 2023-07-16 NOTE — Progress Notes (Signed)
 Lindsay Her, MD Phone: [phone]  Lindsay Tran is a 78 y.o. female who presents today for f/u.  HYPERLIPIDEMIA Symptoms Chest pain on exertion:  no   Medications: Compliance- crestor , restarted repatha  Right upper quadrant pain- no  Muscle aches- no Lipid Panel     Component Value Date/Time   CHOL 147 04/16/2023 1211   TRIG 163 (H) 04/16/2023 1211   HDL 50 04/16/2023 1211   CHOLHDL 2.9 04/16/2023 1211   VLDL 33 04/16/2023 1211   LDLCALC 64 04/16/2023 1211   LDLDIRECT 134.0 09/27/2022 1042   HYPERTENSION Disease Monitoring  Chest pain- no    Dyspnea- no Medications Compliance-  taking amlodipine , metoprolol .  BMET    Component Value Date/Time   NA 138 04/24/2023 1126   NA 143 04/19/2022 0930   K 4.2 04/24/2023 1126   CL 101 04/24/2023 1126   CO2 29 04/24/2023 1126   GLUCOSE 94 04/24/2023 1126   BUN 11 04/24/2023 1126   BUN 9 04/19/2022 0930   CREATININE 0.59 04/24/2023 1126   CALCIUM  9.4 04/24/2023 1126   Laryngopharyngeal reflux: Patient reports she saw ENT in [facility] and they diagnosed Tran with this.  She is been gargling with salt water, using Flonase, taking Mucinex, and taking Maalox.  She is been taping Tran mouth shut at night and sleeping on Tran left side.  Notes these things have been helping.  She is scheduled to see a voice specialist as well.  Social History   Tobacco Use  Smoking Status Never  Smokeless Tobacco Never    Current Outpatient Medications on File Prior to Visit  Medication Sig Dispense Refill   amLODipine  (NORVASC ) 2.5 MG tablet TAKE 1 TABLET BY MOUTH EVERY DAY 90 tablet 3   busPIRone (BUSPAR) 7.5 MG tablet Take 15 mg by mouth 2 (two) times daily.     clobetasol  ointment (TEMOVATE ) 0.05 % Apply 1 application topically 2 (two) times daily as needed. For rash. Avoid face. 180 g 1   clotrimazole  (MYCELEX ) 10 MG troche Take 1 tablet (10 mg total) by mouth 3 (three) times daily. For burning and film of mouth for oral thrush 90  Troche 3   Cyanocobalamin  (VITAMIN B 12 PO) Take by mouth daily.     famotidine  (PEPCID ) 20 MG tablet TAKE 1 TABLET BY MOUTH EVERYDAY AT BEDTIME 90 tablet 1   fluticasone (FLONASE) 50 MCG/ACT nasal spray Place 1 spray into both nostrils daily.     gabapentin (NEURONTIN) 300 MG capsule Take 300 mg by mouth 2 (two) times daily.     hyoscyamine  (LEVSIN  SL) 0.125 MG SL tablet Place 1 tablet (0.125 mg total) under the tongue every 4 (four) hours as needed. 30 tablet 0   ketoconazole  (NIZORAL ) 2 % cream Apply under breasts once daily, twice daily with flares. 60 g 3   magnesium oxide (MAG-OX) 400 MG tablet Take 1 tablet by mouth daily.     metoprolol  tartrate (LOPRESSOR ) 100 MG tablet TAKE 100MG  (1 TABLET) IN THE MORNING AND 50MG  (0.5 TABLET) IN THE EVENING. 135 tablet 3   Multiple Vitamins-Minerals (PRESERVISION AREDS 2 PO) Take by mouth daily.     Omega-3 Fatty Acids (OMEGA 3 500 PO) Take 1 capsule by mouth daily.     pantoprazole  (PROTONIX ) 40 MG tablet Take 1 tablet (40 mg total) by mouth daily. 90 tablet 3   rosuvastatin  (CRESTOR ) 5 MG tablet TAKE 1 TABLET BY MOUTH 3 TIMES PER WEEK 12 tablet 0   Secukinumab , 300 MG  Dose, (COSENTYX  SENSOREADY, 300 MG,) 150 MG/ML SOAJ Inject 300 mg into the skin as directed. On week 0, 1, 2, 3 and 4. 10 mL 0   triamcinolone  ointment (KENALOG ) 0.1 % Apply topically 2 (two) times daily. Use until rash improved. Avoid face, groin, underarms. 240 g 0   valACYclovir  (VALTREX ) 500 MG tablet Take 1 tablet (500 mg total) by mouth 2 (two) times daily. 60 tablet 2   fluconazole  (DIFLUCAN ) 200 MG tablet Take 2 tablets (400 mg total) by mouth daily. (Patient not taking: Reported on 07/16/2023) 7 tablet 0   No current facility-administered medications on file prior to visit.     ROS see history of present illness  Objective  Physical Exam Vitals:   07/16/23 1116  BP: 130/78  Pulse: 61  Temp: 97.6 F (36.4 C)  SpO2: 98%    BP Readings from Last 3 Encounters:   07/16/23 130/78  05/22/23 120/72  04/24/23 124/74   Wt Readings from Last 3 Encounters:  07/16/23 139 lb 6.4 oz (63.2 kg)  05/22/23 137 lb 12.8 oz (62.5 kg)  04/24/23 137 lb 6.4 oz (62.3 kg)    Physical Exam Constitutional:      General: She is not in acute distress.    Appearance: She is not diaphoretic.  Cardiovascular:     Rate and Rhythm: Normal rate and regular rhythm.     Heart sounds: Normal heart sounds.  Pulmonary:     Effort: Pulmonary effort is normal.  Musculoskeletal:     Right lower leg: No edema.     Left lower leg: No edema.  Skin:    General: Skin is warm and dry.  Neurological:     Mental Status: She is alert.      Assessment/Plan: Please see individual problem list.  Laryngopharyngeal reflux Assessment & Plan: Chronic issue.  Improved with lifestyle changes.  Patient will continue Protonix  40 mg daily and continue the measures as advised by ENT.   Hyperlipidemia, unspecified hyperlipidemia type Assessment & Plan: Chronic issue.  Stable.  She will continue Crestor  5 mg 3 times a week and Repatha  140 mg every 14 days.  Orders: -     Repatha  SureClick; Inject 140 mg into the skin every 14 (fourteen) days.  Dispense: 6 mL; Refill: 2  CAD in native artery -     Repatha  SureClick; Inject 140 mg into the skin every 14 (fourteen) days.  Dispense: 6 mL; Refill: 2  Essential hypertension Assessment & Plan: Chronic issue.  Adequately controlled for age.  Patient will continue amlodipine  2.5 mg daily and metoprolol  100 mg in the morning and 50 mg in the evening.   Other orders -     Estradiol ; 1 applicatorful twice weekly  Dispense: 127.5 g; Refill: 1    Return in about 6 months (around 01/13/2024) for Transfer of care to Dr. Hope.   Lindsay Her, MD [facility]

## 2023-07-31 ENCOUNTER — Ambulatory Visit: Payer: PPO | Attending: Cardiology | Admitting: Cardiology

## 2023-07-31 ENCOUNTER — Encounter: Payer: Self-pay | Admitting: Cardiology

## 2023-07-31 VITALS — BP 152/78 | HR 59 | Ht 59.0 in | Wt 142.0 lb

## 2023-07-31 DIAGNOSIS — R002 Palpitations: Secondary | ICD-10-CM

## 2023-07-31 DIAGNOSIS — E782 Mixed hyperlipidemia: Secondary | ICD-10-CM

## 2023-07-31 DIAGNOSIS — I10 Essential (primary) hypertension: Secondary | ICD-10-CM | POA: Diagnosis not present

## 2023-07-31 DIAGNOSIS — R42 Dizziness and giddiness: Secondary | ICD-10-CM | POA: Diagnosis not present

## 2023-07-31 DIAGNOSIS — K219 Gastro-esophageal reflux disease without esophagitis: Secondary | ICD-10-CM | POA: Diagnosis not present

## 2023-07-31 MED ORDER — ROSUVASTATIN CALCIUM 5 MG PO TABS: ORAL_TABLET | ORAL | 0 refills | Status: DC

## 2023-07-31 NOTE — Progress Notes (Signed)
Cardiology Office Note:  .   Date:  07/31/2023  ID:  Lindsay Tran, DOB [birth year], MRN [id number] PCP: Glori Luis, MD  [facility] Providers Cardiologist:  Yvonne Kendall, MD    History of Present Illness: .   Lindsay Tran is a 78 y.o. female with a past medical history of coronary calcification, hypertension, hyperlipidemia, varicose veins, peptic ulcer disease, iron deficiency anemia, essential tremor, scoliosis, and symptomatic arthritis, who is here today for follow-up.   Prior echocardiogram revealed LVEF 55 to 60%, G2 DD, mild mitral regurgitation.  Heart monitor revealed predominant sinus rhythm with an average heart rate of 65 bpm.  She did have 27 supraventricular runs that were observed lasting up to 19 beats and several episodes of brief PSVT as well as rare PACs PVCs.  HPI this were completed in 04/2022 the right ankle-brachial index was noncompressible on the right lower extremity and the right toe brachial index was normal, left first ankle-brachial index in the ICAs were noncompressible lower extremity arteries on the left normal left toe brachial index.   She was last seen in clinic 01/24/2023 stated cardiac perspective that she was doing well.  She was able to tolerate rosuvastatin without incident.  She had also followed up with GI who referred her to an esophageal specialist for esophageal spasms.  There were no changes made to her medication regimen and no further testing that was ordered at that time.  We had reached out to GI to see if it was safe for her to take hyoscyamine for esophageal spasms.  GI provider agreed that it would only be a short-term fix and she should not take medication and extended period of time.  She returns to clinic today states that overall she has been doing well. She denies any chest pain or shortness of breath. She has starting having worsening tinnitus primarily in the right ear, prior to having an episode of dizziness she notes  that she can close her eyes and the feeling often subsides. She denies any syncope or near syncope/ She has been compliant with her current medication regimen. Denies any hospitalizations or visits to the emergency department.   ROS: 10 point review of systems has been reviewed and considered negative except what is been listed in the HPI  Studies Reviewed: Marland Kitchen   EKG Interpretation Date/Time:  Tuesday July 31 2023 13:52:42 EST Ventricular Rate:  59 PR Interval:  132 QRS Duration:  78 QT Interval:  424 QTC Calculation: 419 R Axis:   35  Text Interpretation: Sinus bradycardia with Premature supraventricular complexes When compared with ECG of 25-May-1994 11:50, Confirmed by Charlsie Quest ([id number]) on 07/31/2023 2:04:41 PM    TTE 02/08/22  1. Left ventricular ejection fraction, by estimation, is 55 to 60%. The  left ventricle has normal function. The left ventricle has no regional  wall motion abnormalities. Left ventricular diastolic parameters are  consistent with Grade II diastolic  dysfunction (pseudonormalization).   2. Right ventricular systolic function is normal. The right ventricular  size is normal.   3. Left atrial size was moderately dilated.   4. The mitral valve is normal in structure. Mild mitral valve  regurgitation.   5. The aortic valve is tricuspid. Aortic valve regurgitation is not  visualized. Aortic valve sclerosis is present, with no evidence of aortic  valve stenosis.   6. The inferior vena cava is normal in size with greater than 50%  respiratory variability, suggesting right atrial pressure of 3  mmHg Risk Assessment/Calculations:     HYPERTENSION CONTROL Vitals:   07/31/23 1344 07/31/23 1400  BP: (!) 160/84 (!) 152/78    The patient's blood pressure is elevated above target today.  In order to address the patient's elevated BP: The blood pressure is usually elevated in clinic.  Blood pressures monitored at home have been optimal.          Physical  Exam:   VS:  BP (!) 152/78 (BP Location: Left Arm, Patient Position: Sitting, Cuff Size: Normal)   Pulse (!) 59   Ht 4\' 11"  (1.499 m)   Wt 142 lb (64.4 kg)   SpO2 98%   BMI 28.68 kg/m    Wt Readings from Last 3 Encounters:  07/31/23 142 lb (64.4 kg)  07/16/23 139 lb 6.4 oz (63.2 kg)  05/22/23 137 lb 12.8 oz (62.5 kg)    GEN: Well nourished, well developed in no acute distress NECK: No JVD; No carotid bruits CARDIAC: RRR, I/VI systolic murmur RUSB without rubs or gallops RESPIRATORY:  Clear to auscultation without rales, wheezing or rhonchi  ABDOMEN: Soft, non-tender, non-distended EXTREMITIES:  No edema; No deformity   ASSESSMENT AND PLAN: .   Essential hypertension with blood pressure is 160/84 with repeat blood pressure 152/78.  Previously blood pressure was 120 systolic and at home she is running 120s and 130s systolic.  Patient has a longstanding history of whitecoat hypertension.  She is continued on amlodipine 2.5 mg daily, metoprolol 100 mg in the morning and 50 mg in the evening.  She is encouraged to continue to monitor blood pressure 1 to 2 hours postmedication administration at home as well.  Mixed hyperlipidemia with last LDL 64.  She is continued on rosuvastatin 5 mg 3 times weekly and also Repatha 140 mg injection every 2 weeks.  She has required a refill of her rosuvastatin sent to the pharmacy of choice today.  She is also being sent for updated lipid and hepatic panel since being restarted on Repatha after last visit.  Chronic tinnitus and dizziness.  She states that her symptoms do resolve when she is able to close her eyes on occasion.  She has been scheduled for carotid duplex.  She denies any syncope or near syncope.   Palpitations that have improved with occasional breakthrough palpitations.  She is continued on current metoprolol dosing.  Without the increase of palpitations no current indication for ZIO monitoring needed at this time.  EKG today reveals sinus  bradycardia with a rate of 59 with occasional unifocal PVC with no significant change from prior studies.  LPR which she has continued to be followed at [facility] and has further testing and voice therapy coming up.  She has been continued on Protonix 40 mg daily with other continued treatment recommended by [facility].       Dispo: Patient return to clinic to see MD/APP in 3 months or sooner if needed.  Signed, Jakson Delpilar, NP

## 2023-07-31 NOTE — Patient Instructions (Signed)
Medication Instructions:  The current medical regimen is effective;  continue present plan and medications.  *If you need a refill on your cardiac medications before your next appointment, please call your pharmacy*   Lab Work: Your provider would like for you to return when you have your Carotid US testing to have the following labs drawn: LIPID/HEPATIC.   Please go to [facility] [address] [address], [place] [address] You do not need an appointment.  They are open from 8 am- 4:30 pm.  Lunch from 1:00 pm- 2:00 pm You will need to be fasting.  If you have labs (blood work) drawn today and your tests are completely normal, you will receive your results only by: MyChart Message (if you have MyChart) OR A paper copy in the mail If you have any lab test that is abnormal or we need to change your treatment, we will call you to review the results.   Testing/Procedures: Your physician has requested that you have a carotid duplex. This test is an ultrasound of the carotid arteries in your neck. It looks at blood flow through these arteries that supply the brain with blood.   Allow one hour for this exam.  There are no restrictions or special instructions.  This will take place at [address] [facility]) [address], [place] [address]  Please note: We ask at that you not bring children with you during ultrasound (echo/ vascular) testing. Due to room size and safety concerns, children are not allowed in the ultrasound rooms during exams. Our front office staff cannot provide observation of children in our lobby area while testing is being conducted. An adult accompanying a patient to their appointment will only be allowed in the ultrasound room at the discretion of the ultrasound technician under special circumstances. We apologize for any inconvenience.    Follow-Up: At [facility], you and your health needs are our priority.  As  part of our continuing mission to provide you with exceptional heart care, we have created designated Provider Care Teams.  These Care Teams include your primary Cardiologist (physician) and Advanced Practice Providers (APPs -  Physician Assistants and Nurse Practitioners) who all work together to provide you with the care you need, when you need it.  We recommend signing up for the patient portal called "MyChart".  Sign up information is provided on this After Visit Summary.  MyChart is used to connect with patients for Virtual Visits (Telemedicine).  Patients are able to view lab/test results, encounter notes, upcoming appointments, etc.  Non-urgent messages can be sent to your provider as well.   To learn more about what you can do with MyChart, go to [email or web address].    Your next appointment:   3 month(s)  Provider:   You may see Yvonne Kendall, MD or one of the following Advanced Practice Providers on your designated Care Team:   Nicolasa Ducking, NP Eula Listen, PA-C Cadence Fransico Michael, PA-C Charlsie Quest, NP Carlos Levering, NP

## 2023-08-10 ENCOUNTER — Other Ambulatory Visit (HOSPITAL_COMMUNITY): Payer: Self-pay

## 2023-08-10 ENCOUNTER — Encounter: Payer: Self-pay | Admitting: Oncology

## 2023-08-10 DIAGNOSIS — R1310 Dysphagia, unspecified: Secondary | ICD-10-CM | POA: Diagnosis not present

## 2023-08-27 ENCOUNTER — Ambulatory Visit (INDEPENDENT_AMBULATORY_CARE_PROVIDER_SITE_OTHER): Payer: PPO | Admitting: Dermatology

## 2023-08-27 DIAGNOSIS — D492 Neoplasm of unspecified behavior of bone, soft tissue, and skin: Secondary | ICD-10-CM | POA: Diagnosis not present

## 2023-08-27 DIAGNOSIS — L720 Epidermal cyst: Secondary | ICD-10-CM | POA: Diagnosis not present

## 2023-08-27 DIAGNOSIS — L821 Other seborrheic keratosis: Secondary | ICD-10-CM

## 2023-08-27 DIAGNOSIS — B009 Herpesviral infection, unspecified: Secondary | ICD-10-CM

## 2023-08-27 DIAGNOSIS — L409 Psoriasis, unspecified: Secondary | ICD-10-CM | POA: Diagnosis not present

## 2023-08-27 DIAGNOSIS — D1722 Benign lipomatous neoplasm of skin and subcutaneous tissue of left arm: Secondary | ICD-10-CM

## 2023-08-27 DIAGNOSIS — C4491 Basal cell carcinoma of skin, unspecified: Secondary | ICD-10-CM

## 2023-08-27 DIAGNOSIS — Z79899 Other long term (current) drug therapy: Secondary | ICD-10-CM

## 2023-08-27 HISTORY — DX: Basal cell carcinoma of skin, unspecified: C44.91

## 2023-08-27 MED ORDER — CLOBETASOL PROPIONATE 0.05 % EX OINT: 1.0000 | TOPICAL_OINTMENT | CUTANEOUS | 1 refills | Status: DC

## 2023-08-27 MED ORDER — VALACYCLOVIR HCL 1 G PO TABS: 2000.0000 mg | ORAL_TABLET | Freq: Two times a day (BID) | ORAL | 11 refills | Status: AC

## 2023-08-27 NOTE — Patient Instructions (Addendum)
Valtrex 1 gram For cold sores around mouth take 2 pills at onset of fever blisters and then 2 pills 12 hours later For sores on buttocks, take 2 pills at onset then 2 pills 12 hours later then 1 pill a day for 6 days   Due to recent changes in healthcare laws, you may see results of your pathology and/or laboratory studies on MyChart before the doctors have had a chance to review them. We understand that in some cases there may be results that are confusing or concerning to you. Please understand that not all results are received at the same time and often the doctors may need to interpret multiple results in order to provide you with the best plan of care or course of treatment. Therefore, we ask that you please give [place] 2 business days to thoroughly review all your results before contacting the office for clarification. Should we see a critical lab result, you will be contacted sooner.   If You Need Anything After Your Visit  If you have any questions or concerns for your doctor, please call our main line at [phone] and press option 4 to reach your doctor's medical assistant. If no one answers, please leave a voicemail as directed and we will return your call as soon as possible. Messages left after 4 pm will be answered the following business day.   You may also send [place] a message via MyChart. We typically respond to MyChart messages within 1-2 business days.  For prescription refills, please ask your pharmacy to contact our office. Our fax number is [phone].  If you have an urgent issue when the clinic is closed that cannot wait until the next business day, you can page your doctor at the number below.    Please note that while we do our best to be available for urgent issues outside of office hours, we are not available 24/7.   If you have an urgent issue and are unable to reach [place], you may choose to seek medical care at your doctor's office, retail clinic, urgent care center, or  emergency room.  If you have a medical emergency, please immediately call 911 or go to the emergency department.  Pager Numbers  - Dr. Gwen Pounds: [phone]  - Dr. Roseanne Reno: [phone]  - Dr. Katrinka Blazing: [phone]   In the event of inclement weather, please call our main line at [phone] for an update on the status of any delays or closures.  Dermatology Medication Tips: Please keep the boxes that topical medications come in in order to help keep track of the instructions about where and how to use these. Pharmacies typically print the medication instructions only on the boxes and not directly on the medication tubes.   If your medication is too expensive, please contact our office at [phone] option 4 or send [place] a message through MyChart.   We are unable to tell what your co-pay for medications will be in advance as this is different depending on your insurance coverage. However, we may be able to find a substitute medication at lower cost or fill out paperwork to get insurance to cover a needed medication.   If a prior authorization is required to get your medication covered by your insurance company, please allow [place] 1-2 business days to complete this process.  Drug prices often vary depending on where the prescription is filled and some pharmacies may offer cheaper prices.  The website www.goodrx.com contains coupons for medications through different pharmacies. The  prices here do not account for what the cost may be with help from insurance (it may be cheaper with your insurance), but the website can give you the price if you did not use any insurance.  - You can print the associated coupon and take it with your prescription to the pharmacy.  - You may also stop by our office during regular business hours and pick up a GoodRx coupon card.  - If you need your prescription sent electronically to a different pharmacy, notify our office through [facility] or by phone at  [phone] option 4.     Si Usted Necesita Algo Despus de Su Visita  Tambin puede enviarnos un mensaje a travs de Clinical cytogeneticist. Por lo general respondemos a los mensajes de MyChart en el transcurso de 1 a 2 das hbiles.  Para renovar recetas, por favor pida a su farmacia que se ponga en contacto con nuestra oficina. Annie Sable de fax es [place] [phone].  Si tiene un asunto urgente cuando la clnica est cerrada y que no puede esperar hasta el siguiente da hbil, puede llamar/localizar a su doctor(a) al nmero que aparece a continuacin.   Por favor, tenga en cuenta que aunque hacemos todo lo posible para estar disponibles para asuntos urgentes fuera del horario de [place], no estamos disponibles las 24 horas del da, los 7 [address]  [address] de la [place].   Si tiene un problema urgente y no puede comunicarse con nosotros, puede optar por buscar atencin mdica  en el consultorio de su doctor(a), en una clnica privada, en un centro de atencin urgente o en una sala de emergencias.  Si tiene Engineer, drilling, por favor llame inmediatamente al 911 o vaya a la sala de emergencias.  Nmeros de bper  - Dr. Gwen Pounds: [phone]  - Dra. Roseanne Reno: [phone]  - Dr. Katrinka Blazing: [phone]   En caso de inclemencias del tiempo, por favor llame a Lacy Duverney principal al [phone] para una actualizacin sobre el [place] de cualquier retraso o cierre.  Consejos para la medicacin en dermatologa: Por favor, guarde las cajas en las que vienen los medicamentos de uso tpico para ayudarle a seguir las instrucciones sobre dnde y cmo usarlos. Las farmacias generalmente imprimen las instrucciones del medicamento slo en las cajas y no directamente en los tubos del [place].   Si su medicamento es muy caro, por favor, pngase en contacto con Rolm Gala llamando al [phone] y presione la opcin 4 o envenos un mensaje a travs de Clinical cytogeneticist.   No podemos decirle cul ser su copago por los  medicamentos por adelantado ya que esto es diferente dependiendo de la cobertura de su seguro. Sin embargo, es posible que podamos encontrar un medicamento sustituto a Audiological scientist un formulario para que el seguro cubra el medicamento que se considera necesario.   Si se requiere una autorizacin previa para que su compaa de seguros [place] su medicamento, por favor permtanos de 1 a 2 das hbiles para completar [address]  Los precios de los medicamentos varan con frecuencia dependiendo del Environmental consultant de dnde se surte la receta y alguna farmacias pueden ofrecer precios ms baratos.  El sitio web www.goodrx.com tiene cupones para medicamentos de Health and safety inspector. Los precios aqu no tienen en cuenta lo que podra costar con la ayuda del seguro (puede ser ms barato con su seguro), pero el sitio web puede darle el precio si no utiliz Tourist information centre manager.  - Puede imprimir el cupn correspondiente y llevarlo con su receta  a la farmacia.  - Tambin puede pasar por nuestra oficina durante el horario de atencin regular y Education officer, museum una tarjeta de cupones de GoodRx.  - Si necesita que su receta se enve electrnicamente a una farmacia diferente, informe a nuestra oficina a travs de MyChart de [place] o por telfono llamando al [phone] y presione la opcin 4.

## 2023-08-27 NOTE — Progress Notes (Signed)
Follow-Up Visit   Subjective  Lindsay Tran is a 78 y.o. female who presents for the following: Psoriasis legs, Cosentyx sq injectons q 4wks, check spot R wrist ~17yr, no symptoms, white heads lips, pt would like removed, currently using Neutragena acne medication, check spot L upper arm ~ 4yr, tender to touch.  Needs rfs of valtrex. The patient has spots, moles and lesions to be evaluated, some may be new or changing and the patient may have concern these could be cancer.   The following portions of the chart were reviewed this encounter and updated as appropriate: medications, allergies, medical history  Review of Systems:  No other skin or systemic complaints except as noted in HPI or Assessment and Plan.  Objective  Well appearing patient in no apparent distress; mood and affect are within normal limits.   A focused examination was performed of the following areas: Face, arms, chest  Relevant exam findings are noted in the Assessment and Plan.  L nasal ala 3.18mm pink pearly pap  lower lip vermillion edge x 2 (2) White paps  Assessment & Plan   PSORIASIS on Systemic Treatment Face, legs, arms, trunk Exam: pink scaly patch R jaw <1% BSA.  Chronic condition with duration or expected duration over one year. Currently well-controlled.   Patient does have joint pain and stiffness but Cosentyx helps  Labs- needs annual Quantiferon TB Gold, she will have added to scheduled blood draw tomorrow  Psoriasis - severe on systemic treatment.  Psoriasis is a chronic non-curable, but treatable genetic/hereditary disease that may have other systemic features affecting other organ systems such as joints (Psoriatic Arthritis).  It is linked with heart disease, inflammatory bowel disease, non-alcoholic fatty liver disease, and depression. Significant skin psoriasis and/or psoriatic arthritis may have significant symptoms and affects activities of daily activity and often benefits from  systemic treatments.  These systemic treatments have some potential side effects including immunosuppression and require pre-treatment laboratory screening and periodic laboratory monitoring and periodic in person evaluation and monitoring by the attending dermatologist physician (long term medication management).   Reviewed risks of biologics including immunosuppression, infections, injection site reaction, and failure to improve condition. Goal is control of skin condition, not cure.  Some older biologics such as Humira and Enbrel may slightly increase risk of malignancy and may worsen congestive heart failure.  Taltz and Cosentyx may cause inflammatory bowel disease to flare. The use of biologics requires long term medication management, including periodic office visits and monitoring of blood work.   Treatment Plan: Cont Cosentyx 300mg  sq injections q month Cont Clobetasol oint to more severe areas prn flares, avoid f/g/a Cont TMC 0.1% cr qd prn flares, avoid f/g/a  Topical steroids (such as triamcinolone, fluocinolone, fluocinonide, mometasone, clobetasol, halobetasol, betamethasone, hydrocortisone) can cause thinning and lightening of the skin if they are used for too long in the same area. Your physician has selected the right strength medicine for your problem and area affected on the body. Please use your medication only as directed by your physician to prevent side effects.   Long term medication management.  Patient is using long term (months to years) prescription medication  to control their dermatologic condition.  These medications require periodic monitoring to evaluate for efficacy and side effects and may require periodic laboratory monitoring.     HERPESVIRAL INFECTION (COLD SORES) Perioral, buttocks Exam Clear today  Chronic condition with duration or expected duration over one year. Currently well-controlled.  Herpes Simplex Virus = Cold  Sores = Fever Blisters is a chronic  recurring blistering; scabbing sore-producing viral infection that is recurrent usually in the same area triggered by stress, sun/UV exposure and trauma.  It is infectious and can be spread from person to person by direct contact.  It is not curable, but is treatable with topical and oral medication.  Treatment Plan Cont Valtrex 1g 2 po at onset and 2 po 12 hours later, if treating HSV on buttocks may take 2 po at onset 2 po 12 hours later then 1 po qd x 6 days   SEBORRHEIC KERATOSIS R wrist, arms, chest - Stuck-on, waxy, tan-brown papules and/or plaques  - Benign-appearing - Discussed benign etiology and prognosis. - Observe - Call for any changes  Lipoma  L axilla Exam: Subcutaneous rubbery nodule(s) Location: L axilla  Benign-appearing. Exam most consistent with a lipoma. Discussed that a lipoma is a benign fatty growth that can grow over time and sometimes get irritated. Recommend observation if it is not bothersome or changing. Discussed option of ILK injections or surgical excision to remove it if it is growing, symptomatic, or other changes noted. Please call for new or changing lesions so they can be evaluated.  PSORIASIS   Related Procedures QuantiFERON-TB Gold Plus Related Medications triamcinolone ointment (KENALOG) 0.1 % Apply topically 2 (two) times daily. Use until rash improved. Avoid face, groin, underarms. NEOPLASM OF SKIN L nasal ala Skin / nail biopsy Type of biopsy: tangential   Informed consent: discussed and consent obtained   Anesthesia: the lesion was anesthetized in a standard fashion   Anesthesia comment:  Area prepped with alcohol Anesthetic:  1% lidocaine w/ epinephrine 1-100,000 buffered w/ 8.4% NaHCO3 Instrument used: flexible razor blade   Hemostasis achieved with: pressure, aluminum chloride and electrodesiccation   Outcome: patient tolerated procedure well   Post-procedure details: wound care instructions given   Post-procedure details  comment:  Ointment and small bandage applied Specimen 1 - Surgical pathology Differential Diagnosis: R/O BCC  Check Margins: No 3.35mm pink pearly pap Discussed if BCC can treat with EDC vs mohs vs radiation txt MILIA (2) lower lip vermillion edge x 2 (2) Recommend otc Adapalene gel qhs to milia, sample of [place] given Acne/Milia surgery - lower lip vermillion edge x 2 (2) Procedure risks and benefits were discussed with the patient and verbal consent was obtained. Following prep of the skin on the left lower lip vermillion with an alcohol swab and local anesthesia injection, extraction of milia was performed with a comedone extractor following superficial incision made over their surfaces with a #11 surgical blade. Capillary hemostasis was achieved with 20% aluminum chloride solution. Vaseline ointment was applied to each site. The patient tolerated the procedure well.  Return in about 6 months (around 02/24/2024) for Psoriasis f/u.  I, Ardis Rowan, RMA, am acting as scribe for Willeen Niece, MD .   Documentation: I have reviewed the above documentation for accuracy and completeness, and I agree with the above.  Willeen Niece, MD

## 2023-08-28 ENCOUNTER — Ambulatory Visit: Payer: PPO | Attending: Cardiology

## 2023-08-28 DIAGNOSIS — L409 Psoriasis, unspecified: Secondary | ICD-10-CM | POA: Diagnosis not present

## 2023-08-28 DIAGNOSIS — R42 Dizziness and giddiness: Secondary | ICD-10-CM | POA: Diagnosis not present

## 2023-08-28 DIAGNOSIS — E782 Mixed hyperlipidemia: Secondary | ICD-10-CM | POA: Diagnosis not present

## 2023-08-28 LAB — SURGICAL PATHOLOGY

## 2023-08-29 ENCOUNTER — Telehealth: Payer: Self-pay

## 2023-08-29 LAB — HEPATIC FUNCTION PANEL
ALT: 10 [IU]/L (ref 0–32)
AST: 19 [IU]/L (ref 0–40)
Albumin: 4.2 g/dL (ref 3.8–4.8)
Alkaline Phosphatase: 80 [IU]/L (ref 44–121)
Bilirubin Total: 0.5 mg/dL (ref 0.0–1.2)
Bilirubin, Direct: 0.21 mg/dL (ref 0.00–0.40)
Total Protein: 6.6 g/dL (ref 6.0–8.5)

## 2023-08-29 LAB — LIPID PANEL
Chol/HDL Ratio: 1.8 {ratio} (ref 0.0–4.4)
Cholesterol, Total: 101 mg/dL (ref 100–199)
HDL: 57 mg/dL (ref >39)
LDL Chol Calc (NIH): 20 mg/dL (ref 0–99)
Triglycerides: 140 mg/dL (ref 0–149)
VLDL Cholesterol Cal: 24 mg/dL (ref 5–40)

## 2023-08-29 NOTE — Telephone Encounter (Signed)
Advised pt of bx results and discussed treatment options.  Patient agrees to [facility] in the office.   Scheduled pt for 09/19/23 at 9:00am./sh

## 2023-08-29 NOTE — Telephone Encounter (Signed)
-----   Message from Willeen Niece sent at 08/29/2023  9:28 AM EST ----- 1. Skin, L nasal ala :       BASAL CELL CARCINOMA, NODULAR PATTERN    BCC skin cancer, I recommend EDC for treatment here in office to completely remove.  Mohs surgery is also an option, but would require nasal reconstruction.- please call patient  EDC would leave a round depressed whitish scar about the same size as the original lesion.  It is treated here in office in a procedure we call "scrape and burn".  No further pathology would be performed.  Mid to high 80% cure rate.  Mohs would leave a linear scar and pathology would be done at time of procedure to ensure complete removal.  It has a high 90s% cure rate. We would refer patient to a specialist for Mohs surgery.

## 2023-08-30 NOTE — Progress Notes (Signed)
Normal study with no evidence of stenosis or narrowing of the carotid arteries or subclavian arteries.

## 2023-08-30 NOTE — Progress Notes (Signed)
Continued improvement in cholesterol which is at goal.  Stable liver function.  Continue current medication regimen without changes at this time.

## 2023-09-01 LAB — QUANTIFERON-TB GOLD PLUS
QuantiFERON Mitogen Value: 6.5 [IU]/mL
QuantiFERON Nil Value: 0 [IU]/mL
QuantiFERON TB1 Ag Value: 0.09 [IU]/mL
QuantiFERON TB2 Ag Value: 0.17 [IU]/mL
QuantiFERON-TB Gold Plus: NEGATIVE

## 2023-09-03 ENCOUNTER — Telehealth: Payer: Self-pay

## 2023-09-03 MED ORDER — COSENTYX SENSOREADY (300 MG) 150 MG/ML ~~LOC~~ SOAJ: 300.0000 mg | SUBCUTANEOUS | 5 refills | Status: DC

## 2023-09-03 NOTE — Telephone Encounter (Signed)
-----   Message from Willeen Niece sent at 09/03/2023 12:48 PM EST ----- TB is negative, send in 6 mos rfs Cosentyx  - please call patient

## 2023-09-03 NOTE — Telephone Encounter (Signed)
 Advised pt of lab results.  Refills of Cosentyx sent to [facility].Marguerite Olea

## 2023-09-17 DIAGNOSIS — H532 Diplopia: Secondary | ICD-10-CM | POA: Diagnosis not present

## 2023-09-17 DIAGNOSIS — H353131 Nonexudative age-related macular degeneration, bilateral, early dry stage: Secondary | ICD-10-CM | POA: Diagnosis not present

## 2023-09-17 DIAGNOSIS — H2513 Age-related nuclear cataract, bilateral: Secondary | ICD-10-CM | POA: Diagnosis not present

## 2023-09-19 ENCOUNTER — Ambulatory Visit: Payer: PPO | Admitting: Dermatology

## 2023-09-19 ENCOUNTER — Encounter: Payer: Self-pay | Admitting: Dermatology

## 2023-09-19 DIAGNOSIS — W908XXA Exposure to other nonionizing radiation, initial encounter: Secondary | ICD-10-CM | POA: Diagnosis not present

## 2023-09-19 DIAGNOSIS — C44311 Basal cell carcinoma of skin of nose: Secondary | ICD-10-CM

## 2023-09-19 DIAGNOSIS — L578 Other skin changes due to chronic exposure to nonionizing radiation: Secondary | ICD-10-CM

## 2023-09-19 NOTE — Patient Instructions (Addendum)

## 2023-09-19 NOTE — Progress Notes (Signed)
   Follow-Up Visit   Subjective  Lindsay Tran is a 78 y.o. female who presents for the following: EDC at L nasal ala biopsy proven BASAL CELL CARCINOMA, NODULAR PATTERN   The patient has spots, moles and lesions to be evaluated, some may be new or changing and the patient may have concern these could be cancer.   The following portions of the chart were reviewed this encounter and updated as appropriate: medications, allergies, medical history  Review of Systems:  No other skin or systemic complaints except as noted in HPI or Assessment and Plan.  Objective  Well appearing patient in no apparent distress; mood and affect are within normal limits.   A focused examination was performed of the following areas: Face  Relevant exam findings are noted in the Assessment and Plan.  L nasal ala Pink biopsy site   Assessment & Plan   ACTINIC DAMAGE - chronic, secondary to cumulative UV radiation exposure/sun exposure over time - diffuse scaly erythematous macules with underlying dyspigmentation - Recommend daily broad spectrum sunscreen SPF 30+ to sun-exposed areas, reapply every 2 hours as needed.  - Recommend staying in the shade or wearing long sleeves, sun glasses (UVA+UVB protection) and wide brim hats (4-inch brim around the entire circumference of the hat). - Call for new or changing lesions.  BASAL CELL CARCINOMA (BCC) OF SKIN OF NOSE L nasal ala Destruction of lesion  Destruction method: electrodesiccation and curettage   Informed consent: discussed and consent obtained   Timeout:  patient name, date of birth, surgical site, and procedure verified Patient was prepped and draped in usual sterile fashion: area prepped with alcohol. Anesthesia: the lesion was anesthetized in a standard fashion   Anesthetic:  1% lidocaine w/ epinephrine 1-100,000 buffered w/ 8.4% NaHCO3 Curettage performed in three different directions: Yes   Electrodesiccation performed over the curetted  area: Yes   Final wound size (cm):  0.4 Hemostasis achieved with:  pressure, aluminum chloride and electrodesiccation Outcome: patient tolerated procedure well with no complications   Post-procedure details: wound care instructions given   Additional details:  Mupirocin ointment and Bandaid applied  Biopsy proven BASAL CELL CARCINOMA, NODULAR PATTERN at L nasal ala- EDC today  Return in about 6 months (around 03/21/2024) for w/ Dr. Roseanne Reno, TBSE, recheck [facility] at nose .  I, Soundra Pilon, CMA, am acting as scribe for Willeen Niece, MD .   Documentation: I have reviewed the above documentation for accuracy and completeness, and I agree with the above.  Willeen Niece, MD

## 2023-10-07 ENCOUNTER — Other Ambulatory Visit: Payer: Self-pay | Admitting: Cardiology

## 2023-10-07 ENCOUNTER — Other Ambulatory Visit: Payer: Self-pay | Admitting: Dermatology

## 2023-10-07 DIAGNOSIS — E782 Mixed hyperlipidemia: Secondary | ICD-10-CM

## 2023-10-31 ENCOUNTER — Ambulatory Visit: Payer: PPO | Attending: Cardiology | Admitting: Cardiology

## 2023-10-31 ENCOUNTER — Encounter: Payer: Self-pay | Admitting: Cardiology

## 2023-10-31 VITALS — BP 142/78 | HR 62 | Ht 59.0 in | Wt 136.0 lb

## 2023-10-31 DIAGNOSIS — K219 Gastro-esophageal reflux disease without esophagitis: Secondary | ICD-10-CM

## 2023-10-31 DIAGNOSIS — I10 Essential (primary) hypertension: Secondary | ICD-10-CM | POA: Diagnosis not present

## 2023-10-31 DIAGNOSIS — R002 Palpitations: Secondary | ICD-10-CM

## 2023-10-31 DIAGNOSIS — R42 Dizziness and giddiness: Secondary | ICD-10-CM

## 2023-10-31 DIAGNOSIS — E782 Mixed hyperlipidemia: Secondary | ICD-10-CM

## 2023-10-31 MED ORDER — AMLODIPINE BESYLATE 2.5 MG PO TABS: 2.5000 mg | ORAL_TABLET | Freq: Two times a day (BID) | ORAL | 3 refills | Status: DC

## 2023-10-31 NOTE — Progress Notes (Signed)
 Cardiology Office Note:  .   Date:  10/31/2023  ID:  Lindsay Tran, DOB July 31, 1945, MRN [id number] PCP: Valli Gaw, MD  [facility] Providers Cardiologist:  Sammy Crisp, MD    History of Present Illness: .   Lindsay Tran is a 78 y.o. female with a past medical history of coronary calcification, hypertension, hyperlipidemia, varicose veins, peptic ulcer disease, iron deficiency anemia, essential tremor, scoliosis, and symptomatic arthritis, who is here today for follow-up on her coronary artery calcifications.   Prior echocardiogram revealed LVEF 55 to 60%, G2 DD, mild mitral regurgitation.  Heart monitor revealed predominant sinus rhythm with an average heart rate of 65 bpm.  She did have 27 supraventricular runs that were observed lasting up to 19 beats and several episodes of brief PSVT as well as rare PACs PVCs.  HPI this were completed in 04/2022 the right ankle-brachial index was noncompressible on the right lower extremity and the right toe brachial index was normal, left first ankle-brachial index in the ICAs were noncompressible lower extremity arteries on the left normal left toe brachial index.   She was last seen in clinic 07/31/2019 stating overall she had been doing well.  She had denied any chest pain or shortness of breath.  There were no medication changes that were made.  She was scheduled for an updated carotid duplex and sent for updated labs after being restarted on Repatha .  She returns to clinic today stating that she has been doing fairly well.  She is in the last 6 weeks she has noted uptick in her blood pressures since her gastroenterologist placed her on Cymbalta.  She states that she has been compliant with her current medications without any undue side effects.  She has continued to keep a blood pressure log at home.  Since her blood pressure is increased she is also noticed a worsening tremor at times.  Blood pressure log reveals blood pressure at home 130s  to 160s at home.  She denies any chest pain, shortness of breath or peripheral edema.  Denies any hospitalizations or visits to the emergency department.  ROS: 10 point review of systems has been reviewed and considered negative the exception was been listed in the HPI  Studies Reviewed: Aaron Aas        TTE 02/08/22  1. Left ventricular ejection fraction, by estimation, is 55 to 60%. The  left ventricle has normal function. The left ventricle has no regional  wall motion abnormalities. Left ventricular diastolic parameters are  consistent with Grade II diastolic  dysfunction (pseudonormalization).   2. Right ventricular systolic function is normal. The right ventricular  size is normal.   3. Left atrial size was moderately dilated.   4. The mitral valve is normal in structure. Mild mitral valve  regurgitation.   5. The aortic valve is tricuspid. Aortic valve regurgitation is not  visualized. Aortic valve sclerosis is present, with no evidence of aortic  valve stenosis.   6. The inferior vena cava is normal in size with greater than 50%  respiratory variability, suggesting right atrial pressure of 3 mmHg Risk Assessment/Calculations:        Physical Exam:   VS:  BP (!) 142/78 (BP Location: Left Arm, Patient Position: Sitting, Cuff Size: Normal)   Pulse 62   Ht 4\' 11"  (1.499 m)   Wt 136 lb (61.7 kg)   SpO2 95%   BMI 27.47 kg/m    Wt Readings from Last 3 Encounters:  10/31/23 136  lb (61.7 kg)  07/31/23 142 lb (64.4 kg)  07/16/23 139 lb 6.4 oz (63.2 kg)    GEN: Well nourished, well developed in no acute distress NECK: No JVD; No carotid bruits CARDIAC: RRR, I/VI systolic murmur RUSB, without rubs or gallops RESPIRATORY:  Clear to auscultation without rales, wheezing or rhonchi  ABDOMEN: Soft, non-tender, non-distended EXTREMITIES:  No edema; No deformity   ASSESSMENT AND PLAN: .   Primary hypertension with blood pressure today 142/78.  Blood pressure has been noted to be increasing  at home over approximately the last 6 weeks.  We are increasing her amlodipine  to 2.5 mg twice daily and she is continued on metoprolol  100 mg 150 mg needed.  She has been encouraged to continue to monitor blood pressures 1 to 2 hours postmedication at home and continue with her blood pressure log.  If need be we will add additional agents on return.  Mixed hyperlipidemia with last LDL of 20.  She is continued on Repatha  140 mg injection every 2 weeks.  She has also been on rosuvastatin  5 mg 3 times weekly will likely discontinue this medication on return as cholesterol has improved and her lipid panel remains stable.  Chronic tinnitus and dizziness which is worse with elevated blood pressures but symptoms do resolve when she is able to close her eyes.  Carotid duplex was completed which reveals no stenosis.  She also had an MRI completed which she had no findings.  Palpitations that have improved with only occasional breakthrough palpitations.  She is continue with current metoprolol  dosing.  Without increase in her palpitations at this time there is no current indication for ZIO monitoring noted.  LPR which continues to be followed at [facility].  She has had a few medication changes made but is tolerating them well.  Ongoing management per [facility].       Dispo: Patient to return to clinic to see MD/APP in 4 weeks or sooner if needed for reevaluation of blood pressure medication changes made today.  Signed, Alontae Chaloux, NP

## 2023-10-31 NOTE — Patient Instructions (Signed)
 Medication Instructions:  Your physician recommends the following medication changes.   INCREASE: Amlodipine  2.5 MG 2 times daily 1 (one)  tablet in the morning and 1 (one)  tablet in the pm  *If you need a refill on your cardiac medications before your next appointment, please call your pharmacy*  Lab Work: No labs ordered today  If you have labs (blood work) drawn today and your tests are completely normal, you will receive your results only by: MyChart Message (if you have MyChart) OR A paper copy in the mail If you have any lab test that is abnormal or we need to change your treatment, we will call you to review the results.  Testing/Procedures: No test ordered today   Follow-Up: At [facility], you and your health needs are our priority.  As part of our continuing mission to provide you with exceptional heart care, our providers are all part of one team.  This team includes your primary Cardiologist (physician) and Advanced Practice Providers or APPs (Physician Assistants and Nurse Practitioners) who all work together to provide you with the care you need, when you need it.  Your next appointment:   1 month(s)  Provider:   You may see Sammy Crisp, MD  or one of the following Advanced Practice Providers on your designated Care Team:   Laneta Pintos, NP Gildardo Labrador, PA-C Varney Gentleman, PA-C Cadence Salina, PA-C Ronald Cockayne, NP Morey Ar, NP

## 2023-12-05 ENCOUNTER — Encounter: Payer: Self-pay | Admitting: Cardiology

## 2023-12-05 ENCOUNTER — Ambulatory Visit: Attending: Cardiology | Admitting: Cardiology

## 2023-12-05 ENCOUNTER — Encounter: Payer: Self-pay | Admitting: Oncology

## 2023-12-05 ENCOUNTER — Ambulatory Visit

## 2023-12-05 VITALS — BP 124/74 | HR 64 | Ht <= 58 in | Wt 131.4 lb

## 2023-12-05 DIAGNOSIS — R002 Palpitations: Secondary | ICD-10-CM

## 2023-12-05 DIAGNOSIS — I10 Essential (primary) hypertension: Secondary | ICD-10-CM | POA: Diagnosis not present

## 2023-12-05 DIAGNOSIS — R42 Dizziness and giddiness: Secondary | ICD-10-CM | POA: Diagnosis not present

## 2023-12-05 DIAGNOSIS — K219 Gastro-esophageal reflux disease without esophagitis: Secondary | ICD-10-CM

## 2023-12-05 DIAGNOSIS — E782 Mixed hyperlipidemia: Secondary | ICD-10-CM

## 2023-12-05 DIAGNOSIS — I2510 Atherosclerotic heart disease of native coronary artery without angina pectoris: Secondary | ICD-10-CM

## 2023-12-05 MED ORDER — AMLODIPINE BESYLATE 2.5 MG PO TABS: 2.5000 mg | ORAL_TABLET | Freq: Two times a day (BID) | ORAL | 3 refills | Status: DC

## 2023-12-05 NOTE — Progress Notes (Signed)
 Cardiology Office Note:  .   Date:  12/05/2023  ID:  MARGARINE GROSSHANS, DOB [birth year], MRN [id number] PCP: Valli Gaw, MD  [facility] Providers Cardiologist:  Sammy Crisp, MD    History of Present Illness: .   Lindsay Tran is a 78 y.o. female with a past medical history of coronary calcification, hypertension, hyperlipidemia, varicose veins, peptic ulcer disease, iron deficiency anemia, essential tremor, scoliosis, and symptomatic arthritis, who is here today for follow-up of her coronary artery calcifications.   Prior echocardiogram revealed LVEF 55 to 60%, G2 DD, mild mitral regurgitation.  Heart monitor revealed predominant sinus rhythm with an average heart rate of 65 bpm.  She did have 27 supraventricular runs that were observed lasting up to 19 beats and several episodes of brief PSVT as well as rare PACs PVCs.  HPI this were completed in 04/2022 the right ankle-brachial index was noncompressible on the right lower extremity and the right toe brachial index was normal, left first ankle-brachial index in the ICAs were noncompressible lower extremity arteries on the left normal left toe brachial index.   She was last seen in clinic/23/25 doing fairly well.  Last 6 weeks she had an uptick in her blood pressures since her gastroenterologist placed her on Cymbalta.  Blood pressure log reveals blood pressures at home 130s to 160s.  She denied any other associated symptoms.  Amlodipine  was increased to 2.5 mg twice daily and metoprolol  1 was 100 mg 150 mg as needed.  She was encouraged to continue to monitor blood pressure 1 to 2 hours postmedication administration.  She returns to clinic today stating that overall from a cardiac perspective she has been doing well.  Since adjustment of her blood pressure medication her blood pressure has been well-controlled.  She states that as of late since she has been monitoring her blood pressure she has not noted her blood pressure constantly she  has an irregular heartbeat.  She has noted palpitations on occasion several times a week but not for an extended period of time.  She denies any chest pain, chest pressure, shortness of breath or peripheral edema.  She states she has been compliant with her current medication regimen with any adverse side effects.  She denies any hospitalizations or visits to the emergency department.  ROS: 10 point review of systems has been reviewed and considered negative except ones been listed in the HPI  Studies Reviewed: Aaron Aas        TTE 02/08/22  1. Left ventricular ejection fraction, by estimation, is 55 to 60%. The  left ventricle has normal function. The left ventricle has no regional  wall motion abnormalities. Left ventricular diastolic parameters are  consistent with Grade II diastolic  dysfunction (pseudonormalization).   2. Right ventricular systolic function is normal. The right ventricular  size is normal.   3. Left atrial size was moderately dilated.   4. The mitral valve is normal in structure. Mild mitral valve  regurgitation.   5. The aortic valve is tricuspid. Aortic valve regurgitation is not  visualized. Aortic valve sclerosis is present, with no evidence of aortic  valve stenosis.   6. The inferior vena cava is normal in size with greater than 50%  respiratory variability, suggesting right atrial pressure of 3 mmHg  Risk Assessment/Calculations:             Physical Exam:   VS:  BP 124/74   Pulse 64   Ht 4\' 10"  (1.473 m)  Wt 131 lb 6.4 oz (59.6 kg)   SpO2 97%   BMI 27.46 kg/m    Wt Readings from Last 3 Encounters:  12/05/23 131 lb 6.4 oz (59.6 kg)  10/31/23 136 lb (61.7 kg)  07/31/23 142 lb (64.4 kg)    GEN: Well nourished, well developed in no acute distress NECK: No JVD; No carotid bruits CARDIAC: RRR, I/VI systolic murmur RUSB, without rubs or gallops RESPIRATORY:  Clear to auscultation without rales, wheezing or rhonchi  ABDOMEN: Soft, non-tender,  non-distended EXTREMITIES:  No edema; No deformity   ASSESSMENT AND PLAN: .   Primary hypertension with a blood pressure today 124/74.  Blood pressure has been well-controlled since increase amlodipine  to 2.5 mg twice daily.  She has also been continued on metoprolol  titrate 100 mg in the morning and 50 mg in the evening.  She has been encouraged to continue to monitor pressures 1 to 2 hours postmedication administration as well.  Mixed hyperlipidemia with last LDL 24.  She is continuing with 140 mg every 2 weeks of rosuvastatin  5 mg 3 times weekly.  Discussed discontinuation of rosuvastatin  with current palpitations will continue without medication changes until she has completed her monitor.  Coronary calcifications noted on coronary CTA.  She is continued on Repatha  and rosuvastatin .  No longer taking aspirin 81 mg daily due to longstanding history of peptic ulcer disease.   Chronic tinnitus and dizziness but she states improved with improved blood pressure.  Carotid duplex revealed no stenosis.  MRI was also completed which revealed no findings.  Will continue to monitor.  If worsening of symptoms can refer to ENT.  Palpitations on occasion that had improved with increasing metoprolol  unfortunately now they have increased and she has noted her blood pressure cuff at home saying abnormal heart rhythm.  She has been scheduled for a ZIO XT monitor for 2 weeks to rule out arrhythmia.  LPR which continues to be [facility] at [facility].  Ongoing management per provider at [facility].       Dispo: Patient to return to clinic to see MD/APP in 6 weeks or sooner if needed for reevaluation of symptoms.  Signed, Crews Mccollam, NP

## 2023-12-05 NOTE — Patient Instructions (Signed)
 Medication Instructions:  Your Physician recommend you continue on your current medication as directed.    *If you need a refill on your cardiac medications before your next appointment, please call your pharmacy*  Lab Work: No labs ordered today  If you have labs (blood work) drawn today and your tests are completely normal, you will receive your results only by: MyChart Message (if you have MyChart) OR A paper copy in the mail If you have any lab test that is abnormal or we need to change your treatment, we will call you to review the results.  Testing/Procedures: Zio monitor  ZIO XT- Long Term Monitor Instructions  Your physician has requested you wear a ZIO patch monitor for 14 days.  This is a single patch monitor. [employer] supplies one patch monitor per enrollment. Additional stickers are not available. Please do not apply patch if you will be having a Nuclear Stress Test,  Echocardiogram, Cardiac CT, MRI, or Chest Xray during the period you would be wearing the  monitor. The patch cannot be worn during these tests. You cannot remove and re-apply the  ZIO XT patch monitor.  Your ZIO patch monitor will be mailed 3 day USPS to your address on file. It may take 3-5 days  to receive your monitor after you have been enrolled.  Once you have received your monitor, please review the enclosed instructions. Your monitor  has already been registered assigning a specific monitor serial # to you.  Billing and Patient Assistance Program Information  We have supplied [employer] with any of your insurance information on file for billing purposes. [employer] offers a sliding scale Patient Assistance Program for patients that do not have  insurance, or whose insurance does not completely cover the cost of the ZIO monitor.  You must apply for the Patient Assistance Program to qualify for this discounted rate.  To apply, please call [employer] at [phone], select option 4, select option 2, ask to apply  for  Patient Assistance Program. Sanna Crystal will ask your household income, and how many people  are in your household. They will quote your out-of-pocket cost based on that information.  [employer] will also be able to set up a 72-month, interest-free payment plan if needed.  Applying the monitor   Shave hair from upper left chest.  Hold abrader disc by orange tab. Rub abrader in 40 strokes over the upper left chest as  indicated in your monitor instructions.  Clean area with 4 enclosed alcohol pads. Let dry.  Apply patch as indicated in monitor instructions. Patch will be placed under collarbone on left  side of chest with arrow pointing upward.  Rub patch adhesive wings for 2 minutes. Remove white label marked "1". Remove the white  label marked "2". Rub patch adhesive wings for 2 additional minutes.  While looking in a mirror, press and release button in center of patch. A small green light will  flash 3-4 times. This will be your only indicator that the monitor has been turned on.  Do not shower for the first 24 hours. You may shower after the first 24 hours.  Press the button if you feel a symptom. You will hear a small click. Record Date, Time and  Symptom in the Patient Logbook.  When you are ready to remove the patch, follow instructions on the last 2 pages of Patient  Logbook. Stick patch monitor onto the last page of Patient Logbook.  Place Patient Logbook in the blue and white box.  Use locking tab on box and tape box closed  securely. The blue and white box has prepaid postage on it. Please place it in the mailbox as  soon as possible. Your physician should have your test results approximately 7 days after the  monitor has been mailed back to [facility].  Call [facility] Customer Care at [phone] if you have questions regarding  your ZIO XT patch monitor. Call them immediately if you see an orange light blinking on your  monitor.  If your monitor falls off in less than  4 days, contact our Monitor department at [phone].  If your monitor becomes loose or falls off after 4 days call [employer] at [phone] for  suggestions on securing your monitor   Follow-Up: At [facility], you and your health needs are our priority.  As part of our continuing mission to provide you with exceptional heart care, our providers are all part of one team.  This team includes your primary Cardiologist (physician) and Advanced Practice Providers or APPs (Physician Assistants and Nurse Practitioners) who all work together to provide you with the care you need, when you need it.  Your next appointment:   3 month(s)  Provider:   Sammy Crisp, MD or Ronald Cockayne, NP

## 2023-12-10 DIAGNOSIS — R200 Anesthesia of skin: Secondary | ICD-10-CM | POA: Diagnosis not present

## 2023-12-10 DIAGNOSIS — R202 Paresthesia of skin: Secondary | ICD-10-CM | POA: Diagnosis not present

## 2023-12-10 DIAGNOSIS — G629 Polyneuropathy, unspecified: Secondary | ICD-10-CM | POA: Diagnosis not present

## 2023-12-10 DIAGNOSIS — R251 Tremor, unspecified: Secondary | ICD-10-CM | POA: Diagnosis not present

## 2023-12-28 DIAGNOSIS — M170 Bilateral primary osteoarthritis of knee: Secondary | ICD-10-CM | POA: Diagnosis not present

## 2023-12-31 DIAGNOSIS — M542 Cervicalgia: Secondary | ICD-10-CM | POA: Diagnosis not present

## 2023-12-31 DIAGNOSIS — M545 Low back pain, unspecified: Secondary | ICD-10-CM | POA: Diagnosis not present

## 2024-01-01 DIAGNOSIS — R002 Palpitations: Secondary | ICD-10-CM | POA: Diagnosis not present

## 2024-01-04 DIAGNOSIS — M170 Bilateral primary osteoarthritis of knee: Secondary | ICD-10-CM | POA: Diagnosis not present

## 2024-01-06 DIAGNOSIS — R002 Palpitations: Secondary | ICD-10-CM

## 2024-01-07 ENCOUNTER — Ambulatory Visit: Payer: Self-pay | Admitting: Cardiology

## 2024-01-07 NOTE — Progress Notes (Signed)
 Average heart rate of 68 bpm, rare early beats, there was one 6 beat run of nonsustained V. tach, 29 supraventricular runs lasting up to 26.6 seconds.  Patient is currently taking metoprolol  100 mg in the a.m. 50 mg in the p.m., recommend increasing metoprolol  to 100 mg twice daily.

## 2024-01-09 DIAGNOSIS — M47814 Spondylosis without myelopathy or radiculopathy, thoracic region: Secondary | ICD-10-CM | POA: Diagnosis not present

## 2024-01-09 DIAGNOSIS — M4134 Thoracogenic scoliosis, thoracic region: Secondary | ICD-10-CM | POA: Diagnosis not present

## 2024-01-10 DIAGNOSIS — M5451 Vertebrogenic low back pain: Secondary | ICD-10-CM | POA: Diagnosis not present

## 2024-01-10 DIAGNOSIS — M170 Bilateral primary osteoarthritis of knee: Secondary | ICD-10-CM | POA: Diagnosis not present

## 2024-01-14 ENCOUNTER — Encounter: Payer: PPO | Admitting: Family Medicine

## 2024-01-25 DIAGNOSIS — M5451 Vertebrogenic low back pain: Secondary | ICD-10-CM | POA: Diagnosis not present

## 2024-01-30 DIAGNOSIS — M5451 Vertebrogenic low back pain: Secondary | ICD-10-CM | POA: Diagnosis not present

## 2024-02-04 ENCOUNTER — Encounter: Payer: Self-pay | Admitting: Oncology

## 2024-02-04 DIAGNOSIS — R202 Paresthesia of skin: Secondary | ICD-10-CM | POA: Diagnosis not present

## 2024-02-04 DIAGNOSIS — R251 Tremor, unspecified: Secondary | ICD-10-CM | POA: Diagnosis not present

## 2024-02-04 DIAGNOSIS — R200 Anesthesia of skin: Secondary | ICD-10-CM | POA: Diagnosis not present

## 2024-02-04 DIAGNOSIS — Z1331 Encounter for screening for depression: Secondary | ICD-10-CM | POA: Diagnosis not present

## 2024-02-04 DIAGNOSIS — G629 Polyneuropathy, unspecified: Secondary | ICD-10-CM | POA: Diagnosis not present

## 2024-02-05 DIAGNOSIS — M5451 Vertebrogenic low back pain: Secondary | ICD-10-CM | POA: Diagnosis not present

## 2024-02-06 DIAGNOSIS — M25561 Pain in right knee: Secondary | ICD-10-CM | POA: Diagnosis not present

## 2024-02-06 DIAGNOSIS — M25562 Pain in left knee: Secondary | ICD-10-CM | POA: Diagnosis not present

## 2024-02-08 DIAGNOSIS — M7672 Peroneal tendinitis, left leg: Secondary | ICD-10-CM | POA: Diagnosis not present

## 2024-02-08 DIAGNOSIS — M19072 Primary osteoarthritis, left ankle and foot: Secondary | ICD-10-CM | POA: Diagnosis not present

## 2024-02-11 ENCOUNTER — Encounter

## 2024-02-11 DIAGNOSIS — E785 Hyperlipidemia, unspecified: Secondary | ICD-10-CM

## 2024-02-11 DIAGNOSIS — R109 Unspecified abdominal pain: Secondary | ICD-10-CM

## 2024-02-11 DIAGNOSIS — R251 Tremor, unspecified: Secondary | ICD-10-CM

## 2024-02-11 DIAGNOSIS — I2510 Atherosclerotic heart disease of native coronary artery without angina pectoris: Secondary | ICD-10-CM

## 2024-02-13 DIAGNOSIS — K219 Gastro-esophageal reflux disease without esophagitis: Secondary | ICD-10-CM | POA: Diagnosis not present

## 2024-02-13 DIAGNOSIS — M25562 Pain in left knee: Secondary | ICD-10-CM | POA: Diagnosis not present

## 2024-02-13 DIAGNOSIS — M25561 Pain in right knee: Secondary | ICD-10-CM | POA: Diagnosis not present

## 2024-02-13 DIAGNOSIS — R194 Change in bowel habit: Secondary | ICD-10-CM | POA: Diagnosis not present

## 2024-02-20 ENCOUNTER — Ambulatory Visit: Payer: PPO | Admitting: *Deleted

## 2024-02-20 VITALS — Ht <= 58 in | Wt 122.0 lb

## 2024-02-20 DIAGNOSIS — Z0000 Encounter for general adult medical examination without abnormal findings: Secondary | ICD-10-CM | POA: Diagnosis not present

## 2024-02-20 DIAGNOSIS — Z780 Asymptomatic menopausal state: Secondary | ICD-10-CM | POA: Diagnosis not present

## 2024-02-20 DIAGNOSIS — M25561 Pain in right knee: Secondary | ICD-10-CM | POA: Diagnosis not present

## 2024-02-20 DIAGNOSIS — M25562 Pain in left knee: Secondary | ICD-10-CM | POA: Diagnosis not present

## 2024-02-20 NOTE — Patient Instructions (Addendum)
 Lindsay Tran , Thank you for taking time out of your busy schedule to complete your Annual Wellness Visit with me. I enjoyed our conversation and look forward to speaking with you again next year. I, as well as your care team,  appreciate your ongoing commitment to your health goals. Please review the following plan we discussed and let me know if I can assist you in the future. Your Game plan/ To Do List    Referrals: If you haven't heard from the office you've been referred to, please reach out to them at the phone provided.   An order for your Dexa/Bone density has been placed, You have an order for:  []   2D Mammogram  []   3D Mammogram  [x]   Bone Density     Please call for appointment:  [facility]  [address] [place] [address] [phone]     Make sure to wear two-piece clothing.  No lotions, powders, or deodorants the day of the appointment. Make sure to bring picture ID and insurance card.  Bring list of medications you are currently taking including any supplements.    Follow up Visits: We will see or speak with you next year for your Next Medicare AWV with our clinical staff Have you seen your provider in the last 6 months (3 months if uncontrolled diabetes)? No Remember your upcoming appointment 03/07/24  Clinician Recommendations:  Aim for 30 minutes of exercise or brisk walking, 6-8 glasses of water, and 5 servings of fruits and vegetables each day.       This is a list of the screenings recommended for you:  Health Maintenance  Topic Date Due   Mammogram  05/31/2024   COVID-19 Vaccine (8 - Pfizer risk 2024-25 season) 08/13/2024   Medicare Annual Wellness Visit  02/19/2025   DTaP/Tdap/Td vaccine (2 - Td or Tdap) 07/20/2025   Pneumococcal Vaccine for age over 78  Completed   Flu Shot  Completed   DEXA scan (bone density measurement)  Completed   Zoster (Shingles) Vaccine  Completed   Hepatitis B Vaccine   Aged Out   HPV Vaccine  Aged Out   Meningitis B Vaccine  Aged Out   Colon Cancer Screening  Discontinued   Hepatitis C Screening  Discontinued    Advanced directives: (In Chart) A copy of your advanced directives are scanned into your chart should your provider ever need it. Advance Care Planning is important because it:  [x]  Makes sure you receive the medical care that is consistent with your values, goals, and preferences  [x]  It provides guidance to your family and loved ones and reduces their decisional burden about whether or not they are making the right decisions based on your wishes.

## 2024-02-20 NOTE — Progress Notes (Signed)
 Subjective:   Lindsay Tran is a 78 y.o. who presents for a Medicare Wellness preventive visit.  As a reminder, Annual Wellness Visits don't include a physical exam, and some assessments may be limited, especially if this visit is performed virtually. We may recommend an in-person follow-up visit with your provider if needed.  Visit Complete: Virtual I connected with  Lindsay Tran on 02/20/24 by a audio enabled telemedicine application and verified that I am speaking with the correct person using two identifiers.  Patient Location: Home  Provider Location: Home Office  I discussed the limitations of evaluation and management by telemedicine. The patient expressed understanding and agreed to proceed.  Vital Signs: Because this visit was a virtual/telehealth visit, some criteria may be missing or patient reported. Any vitals not documented were not able to be obtained and vitals that have been documented are patient reported.  VideoDeclined- This patient declined Librarian, academic. Therefore the visit was completed with audio only.  Persons Participating in Visit: Patient.  AWV Questionnaire: Yes: Patient Medicare AWV questionnaire was completed by the patient on 02/13/24; I have confirmed that all information answered by patient is correct and no changes since this date.  Cardiac Risk Factors include: advanced age (>8men, >56 women);dyslipidemia;hypertension;Other (see comment), Risk factor comments: CAD     Objective:    Today's Vitals   02/20/24 1335  Weight: 122 lb (55.3 kg)  Height: 4' 10 (1.473 m)   Body mass index is 25.5 kg/m.     02/20/2024    1:52 PM 02/19/2023    3:56 PM 03/24/2022   12:08 PM 02/13/2022    3:35 PM 11/28/2021   10:46 AM 09/19/2021   11:06 AM 02/10/2021    7:51 AM  Advanced Directives  Does Patient Have a Medical Advance Directive? Yes Yes Yes Yes Yes Yes Yes  Type of Estate agent of  [place];Living will Healthcare Power of [place];Living will Healthcare Power of [place];Living will Healthcare Power of [place];Living will Living will;Healthcare Power of [employer] Power of [place];Living will  Does patient want to make changes to medical advance directive? No - Patient declined   No - Patient declined     Copy of Healthcare Power of Attorney in Chart? Yes - validated most recent copy scanned in chart (See row information) No - copy requested  Yes - validated most recent copy scanned in chart (See row information) No - copy requested  Yes - validated most recent copy scanned in chart (See row information)    Current Medications (verified) Outpatient Encounter Medications as of 02/20/2024  Medication Sig   amLODipine  (NORVASC ) 2.5 MG tablet Take 1 tablet (2.5 mg total) by mouth 2 (two) times daily. 1 (one) tablet morning and 1 ( one) at night   baclofen (LIORESAL) 10 MG tablet Take 5 mg by mouth. (Patient taking differently: Take 5 mg by mouth daily as needed.)   busPIRone (BUSPAR) 7.5 MG tablet Take 15 mg by mouth 2 (two) times daily.   clobetasol  ointment (TEMOVATE ) 0.05 % Apply 1 Application topically as directed. qd to bid aa of more severe areas of psoriasis prn flares avoid face, groin, axilla   Cyanocobalamin  (VITAMIN B 12 PO) Take by mouth daily.   DULoxetine (CYMBALTA) 20 MG capsule Take 1 tablet by mouth daily.   estradiol  (ESTRACE ) 0.1 MG/GM vaginal cream 1 applicatorful twice weekly   Evolocumab  (REPATHA  SURECLICK) 140 MG/ML SOAJ Inject 140 mg into the skin every 14 (  fourteen) days.   fluticasone (FLONASE) 50 MCG/ACT nasal spray Place 1 spray into both nostrils daily.   gabapentin (NEURONTIN) 300 MG capsule Take 300 mg by mouth 2 (two) times daily.   ketoconazole  (NIZORAL ) 2 % cream APPLY UNDER BREASTS ONCE DAILY, TWICE DAILY WITH FLARES.   magnesium oxide (MAG-OX) 400 MG tablet Take 1 tablet by mouth daily.   metoprolol  tartrate (LOPRESSOR ) 100 MG  tablet TAKE 100MG  (1 TABLET) IN THE MORNING AND 50MG  (0.5 TABLET) IN THE EVENING.   Multiple Vitamins-Minerals (PRESERVISION AREDS 2 PO) Take by mouth daily.   pantoprazole  (PROTONIX ) 40 MG tablet Take 1 tablet (40 mg total) by mouth daily.   rosuvastatin  (CRESTOR ) 5 MG tablet TAKE 1 TABLET BY MOUTH 3 TIMES PER WEEK   Secukinumab , 300 MG Dose, (COSENTYX  SENSOREADY, 300 MG,) 150 MG/ML SOAJ Inject 2 mLs (300 mg total) into the skin every 28 (twenty-eight) days. For maintenance.   tacrolimus (PROTOPIC) 0.1 % ointment Apply 1 Application topically.   triamcinolone  ointment (KENALOG ) 0.1 % Apply topically 2 (two) times daily. Use until rash improved. Avoid face, groin, underarms.   valACYclovir  (VALTREX ) 1000 MG tablet Take 2 tablets (2,000 mg total) by mouth 2 (two) times daily. Take 2 tables po at onset of cold sores then 2 po 12 hours later (Patient taking differently: Take 2,000 mg by mouth 2 (two) times daily. Take 2 tables po at onset of cold sores then 2 po 12 hours later as needed)   Omega-3 Fatty Acids (OMEGA 3 500 PO) Take 1 capsule by mouth daily.   No facility-administered encounter medications on file as of 02/20/2024.    Allergies (verified) Gluten meal   History: Past Medical History:  Diagnosis Date   Abdominal pain    Adjustment disorder with anxiety    Anemia    Anxiety    adjustment disorder with anxiety   Arthritis    Atrophic vaginitis    Basal cell carcinoma 08/27/2023   L nasal ala, EDC 09/19/2023   BPPV (benign paroxysmal positional vertigo)    CAD in native artery    Cataract    Chronic diarrhea    Chronic pain of right knee    Coronary artery disease    Dysphagia    Dysphasia    Dysuria    Family history of colon cancer    Gastric ulcer 1980's   Gastric ulcer    Gastroesophageal hernia    GERD (gastroesophageal reflux disease)    High cholesterol    Hyperlipidemia    Hypertension    Macular degeneration    Osteoporosis    Osteoporosis     post-menopausal   Pedal edema    Psoriasis    Schatzki's ring 05/02/2013   Schatzki's ring    Scoliosis    Scoliosis    UTI (urinary tract infection)    Past Surgical History:  Procedure Laterality Date   ABDOMINAL HYSTERECTOMY     CARDIAC CATHETERIZATION     CHOLECYSTECTOMY     COLONOSCOPY     COLONOSCOPY N/A 11/12/2014   Procedure: COLONOSCOPY;  Surgeon: Lamar ONEIDA Holmes, MD;  Location: [facility] ENDOSCOPY;  Service: Endoscopy;  Laterality: N/A;   COLONOSCOPY N/A 05/24/2020   Procedure: COLONOSCOPY;  Surgeon: Toledo, Ladell POUR, MD;  Location: [facility] ENDOSCOPY;  Service: Endoscopy;  Laterality: N/A;   DIAGNOSTIC LAPAROSCOPY     paraesphogeal hernia   ESOPHAGOGASTRODUODENOSCOPY     ESOPHAGOGASTRODUODENOSCOPY (EGD) WITH PROPOFOL  N/A 04/16/2017   Procedure: ESOPHAGOGASTRODUODENOSCOPY (EGD) WITH PROPOFOL ;  Surgeon:  Viktoria Lamar DASEN, MD;  Location: [facility] ENDOSCOPY;  Service: Endoscopy;  Laterality: N/A;   ESOPHAGOGASTRODUODENOSCOPY (EGD) WITH PROPOFOL  N/A 02/10/2021   Procedure: ESOPHAGOGASTRODUODENOSCOPY (EGD) WITH PROPOFOL ;  Surgeon: Therisa Bi, MD;  Location: [facility] ENDOSCOPY;  Service: Gastroenterology;  Laterality: N/A;   ESOPHAGOGASTRODUODENOSCOPY (EGD) WITH PROPOFOL  N/A 11/28/2021   Procedure: ESOPHAGOGASTRODUODENOSCOPY (EGD) WITH PROPOFOL ;  Surgeon: Maryruth Ole DASEN, MD;  Location: [facility] ENDOSCOPY;  Service: Endoscopy;  Laterality: N/A;   GIVENS CAPSULE STUDY N/A 03/08/2021   Procedure: GIVENS CAPSULE STUDY;  Surgeon: Therisa Bi, MD;  Location: [facility] ENDOSCOPY;  Service: Gastroenterology;  Laterality: N/A;   HERNIA REPAIR     inguinal hernia   HERNIA REPAIR     umbilical hernia   Family History  Problem Relation Age of Onset   Diabetes Mother    Parkinson's disease Mother    Stroke Mother    Heart disease Mother    Cancer Father        Pancreatic   Cancer Sister        Lung   Diabetes Sister    Diabetes Sister    Alzheimer's disease Sister    Tics Sister    Stroke Brother     Alzheimer's disease Brother    Restless legs syndrome Brother    Alzheimer's disease Paternal Aunt        x2 sisters   Diverticulosis Daughter    Breast cancer Neg Hx    Social History   Socioeconomic History   Marital status: Single    Spouse name: Not on file   Number of children: Not on file   Years of education: Not on file   Highest education level: Some college, no degree  Occupational History   Not on file  Tobacco Use   Smoking status: Never   Smokeless tobacco: Never  Vaping Use   Vaping status: Never Used  Substance and Sexual Activity   Alcohol use: No    Alcohol/week: 0.0 standard drinks of alcohol   Drug use: No   Sexual activity: Never  Other Topics Concern   Not on file  Social History Narrative   Divorced   Retired as a Occupational hygienist   12 + years of school    Social Drivers of Corporate investment banker Strain: Low Risk  (02/13/2024)   Overall Financial Resource Strain (CARDIA)    Difficulty of Paying Living Expenses: Not hard at all  Food Insecurity: No Food Insecurity (02/13/2024)   Hunger Vital Sign    Worried About Running Out of Food in the Last Year: Never true    Ran Out of Food in the Last Year: Never true  Transportation Needs: No Transportation Needs (02/13/2024)   PRAPARE - Administrator, Civil Service (Medical): No    Lack of Transportation (Non-Medical): No  Physical Activity: Insufficiently Active (02/13/2024)   Exercise Vital Sign    Days of Exercise per Week: 3 days    Minutes of Exercise per Session: 40 min  Stress: No Stress Concern Present (02/13/2024)   [employer] of Occupational Health - Occupational Stress Questionnaire    Feeling of Stress: Not at all  Social Connections: Moderately Isolated (02/13/2024)   Social Connection and Isolation Panel    Frequency of Communication with Friends and Family: Three times a week    Frequency of Social Gatherings with Friends and Family: Once a week    Attends Religious  Services: 1 to 4 times per year    Active  Member of Clubs or Organizations: No    Attends Engineer, structural: Never    Marital Status: Divorced    Tobacco Counseling Counseling given: Not Answered    Clinical Intake:  Pre-visit preparation completed: Yes  Pain : No/denies pain     BMI - recorded: 25.5 Nutritional Status: BMI 25 -29 Overweight Nutritional Risks: Nausea/ vomitting/ diarrhea (has had diarrhea for years doctor is aware) Diabetes: No  Lab Results  Component Value Date   HGBA1C 5.4 07/22/2020   HGBA1C 5.3 09/20/2017   HGBA1C 5.9 09/04/2016     How often do you need to have someone help you when you read instructions, pamphlets, or other written materials from your doctor or pharmacy?: 1 - Never  Interpreter Needed?: No  Information entered by :: R. Cash Meadow LPN   Activities of Daily Living     02/13/2024    3:06 PM  In your present state of health, do you have any difficulty performing the following activities:  Hearing? 0  Vision? 0  Difficulty concentrating or making decisions? 0  Walking or climbing stairs? 0  Dressing or bathing? 0  Doing errands, shopping? 0  Preparing Food and eating ? N  Using the Toilet? N  In the past six months, have you accidently leaked urine? Y  Do you have problems with loss of bowel control? Y  Managing your Medications? N  Managing your Finances? N  Housekeeping or managing your Housekeeping? N    Patient Care Team: Mady Bruckner, MD as PCP - Cardiology (Cardiology) Melanee Annah BROCKS, MD as Consulting Physician (Hematology and Oncology)  I have updated your Care Teams any recent Medical Services you may have received from other providers in the past year.     Assessment:   This is a routine wellness examination for Lindsay Tran.  Hearing/Vision screen Hearing Screening - Comments:: No issues Vision Screening - Comments:: glasses   Goals Addressed             This Visit's Progress    Patient  Stated       Wants to continue to eat well       Depression Screen     02/20/2024    1:44 PM 07/16/2023   11:20 AM 05/22/2023   11:25 AM 04/24/2023   11:06 AM 02/19/2023    3:50 PM 01/12/2023   12:01 PM 09/27/2022   10:25 AM  PHQ 2/9 Scores  PHQ - 2 Score 0 0 2 2 2  0 0  PHQ- 9 Score 0 4 6 5 5  0     Fall Risk     02/13/2024    3:06 PM 07/16/2023   11:20 AM 05/22/2023   11:25 AM 04/24/2023   11:06 AM 02/19/2023    3:44 PM  Fall Risk   Falls in the past year? 0 0 0 0 0  Number falls in past yr: 0 0 0 0 0  Injury with Fall? 0 0 0 0 0  Risk for fall due to : No Fall Risks  No Fall Risks No Fall Risks No Fall Risks  Follow up Falls evaluation completed;Falls prevention discussed Falls evaluation completed Falls evaluation completed Falls evaluation completed Falls prevention discussed;Falls evaluation completed    MEDICARE RISK AT HOME:  Medicare Risk at Home Any stairs in or around the home?: (Patient-Rptd) No If so, are there any without handrails?: (Patient-Rptd) No Home free of loose throw rugs in walkways, pet beds, electrical cords, etc?: (Patient-Rptd) Yes Adequate lighting  in your home to reduce risk of falls?: (Patient-Rptd) Yes Life alert?: (Patient-Rptd) No Use of a cane, walker or w/c?: Yes (walker at times) Grab bars in the bathroom?: (Patient-Rptd) Yes Shower chair or bench in shower?: (Patient-Rptd) Yes Elevated toilet seat or a handicapped toilet?: (Patient-Rptd) Yes  TIMED UP AND GO:  Was the test performed?  No  Cognitive Function: 6CIT completed    09/29/2016   10:34 AM 09/30/2015   10:43 AM  MMSE - Mini Mental State Exam  Orientation to time 5  5   Orientation to Place 5  5   Registration 3  3   Attention/ Calculation 5  5   Recall 3  3   Language- name 2 objects 2  2   Language- repeat 1 1  Language- follow 3 step command 3  3   Language- read & follow direction 1  1   Write a sentence 1  1   Copy design 1  1   Total score 30  30      Data saved  with a previous flowsheet row definition        02/20/2024    1:54 PM 02/19/2023    3:57 PM 02/13/2022    3:44 PM 02/07/2021   12:53 PM 01/16/2020    2:25 PM  6CIT Screen  What Year? 0 points 0 points 0 points 0 points 0 points  What month? 0 points 0 points 0 points 0 points 0 points  What time? 0 points 0 points 0 points 0 points 0 points  Count back from 20 0 points 0 points 0 points 0 points   Months in reverse 2 points 0 points 0 points 0 points 0 points  Repeat phrase 0 points 0 points 0 points 0 points 0 points  Total Score 2 points 0 points 0 points 0 points     Immunizations Immunization History  Administered Date(s) Administered   Fluad Quad(high Dose 65+) 03/31/2019, 03/27/2022   Influenza, High Dose Seasonal PF 03/31/2016, 04/06/2017, 03/27/2018, 03/29/2018, 03/30/2020, 04/01/2021   Influenza-Unspecified 04/14/2015   Moderna Covid-19 Fall Seasonal Vaccine 8yrs & older 03/31/2022   PFIZER Comirnaty(Gray Top)Covid-19 Tri-Sucrose Vaccine 10/12/2020   PFIZER(Purple Top)SARS-COV-2 Vaccination 07/28/2019, 08/18/2019, 04/16/2020   PNEUMOCOCCAL CONJUGATE-20 06/26/2022   Pfizer Covid-19 Vaccine Bivalent Booster 6yrs & up 04/06/2021   Pneumococcal Conjugate-13 09/30/2015   Pneumococcal Polysaccharide-23 06/15/2010   Pneumococcal-Unspecified 07/17/2011   Respiratory Syncytial Virus Vaccine,Recomb Aduvanted(Arexvy) 04/12/2022   Tdap 07/21/2015   Zoster Recombinant(Shingrix) 07/26/2021, 11/08/2021   Zoster, Live 08/17/2011    Screening Tests Health Maintenance  Topic Date Due   COVID-19 Vaccine (7 - 2024-25 season) 03/11/2023   Medicare Annual Wellness (AWV)  02/19/2024   INFLUENZA VACCINE  10/07/2024 (Originally 02/08/2024)   MAMMOGRAM  05/31/2024   DTaP/Tdap/Td (2 - Td or Tdap) 07/20/2025   Pneumococcal Vaccine: 50+ Years  Completed   DEXA SCAN  Completed   Zoster Vaccines- Shingrix  Completed   Hepatitis B Vaccines  Aged Out   HPV VACCINES  Aged Out   Meningococcal B  Vaccine  Aged Out   Colonoscopy  Discontinued   Hepatitis C Screening  Discontinued    Health Maintenance  Health Maintenance Due  Topic Date Due   COVID-19 Vaccine (7 - 2024-25 season) 03/11/2023   Medicare Annual Wellness (AWV)  02/19/2024   Health Maintenance Items Addressed: Dexa ordered.   Additional Screening:  Vision Screening: Recommended annual ophthalmology exams for early detection of glaucoma and other disorders  of the eye. Up to date  [facility] Would you like a referral to an eye doctor? No    Dental Screening: Recommended annual dental exams for proper oral hygiene  Community Resource Referral / Chronic Care Management: CRR required this visit?  No   CCM required this visit?  No   Plan:    I have personally reviewed and noted the following in the patient's chart:   Medical and social history Use of alcohol, tobacco or illicit drugs  Current medications and supplements including opioid prescriptions. Patient is not currently taking opioid prescriptions. Functional ability and status Nutritional status Physical activity Advanced directives List of other physicians Hospitalizations, surgeries, and ER visits in previous 12 months Vitals Screenings to include cognitive, depression, and falls Referrals and appointments  In addition, I have reviewed and discussed with patient certain preventive protocols, quality metrics, and best practice recommendations. A written personalized care plan for preventive services as well as general preventive health recommendations were provided to patient.   Angeline Fredericks, LPN   [id number]/[id number]   After Visit Summary: (MyChart) Due to this being a telephonic visit, the after visit summary with patients personalized plan was offered to patient via MyChart   Notes: Nothing significant to report at this time.

## 2024-02-28 ENCOUNTER — Other Ambulatory Visit: Payer: Self-pay

## 2024-02-28 DIAGNOSIS — L409 Psoriasis, unspecified: Secondary | ICD-10-CM

## 2024-02-28 MED ORDER — COSENTYX SENSOREADY (300 MG) 150 MG/ML ~~LOC~~ SOAJ: 300.0000 mg | SUBCUTANEOUS | 5 refills | Status: DC

## 2024-03-03 DIAGNOSIS — K3189 Other diseases of stomach and duodenum: Secondary | ICD-10-CM | POA: Diagnosis not present

## 2024-03-03 DIAGNOSIS — K2289 Other specified disease of esophagus: Secondary | ICD-10-CM | POA: Diagnosis not present

## 2024-03-03 DIAGNOSIS — R1310 Dysphagia, unspecified: Secondary | ICD-10-CM | POA: Diagnosis not present

## 2024-03-03 DIAGNOSIS — R432 Parageusia: Secondary | ICD-10-CM | POA: Diagnosis not present

## 2024-03-03 DIAGNOSIS — K224 Dyskinesia of esophagus: Secondary | ICD-10-CM | POA: Diagnosis not present

## 2024-03-05 DIAGNOSIS — M4134 Thoracogenic scoliosis, thoracic region: Secondary | ICD-10-CM | POA: Diagnosis not present

## 2024-03-05 DIAGNOSIS — M545 Low back pain, unspecified: Secondary | ICD-10-CM | POA: Diagnosis not present

## 2024-03-05 DIAGNOSIS — M7672 Peroneal tendinitis, left leg: Secondary | ICD-10-CM | POA: Diagnosis not present

## 2024-03-05 DIAGNOSIS — M2011 Hallux valgus (acquired), right foot: Secondary | ICD-10-CM | POA: Diagnosis not present

## 2024-03-05 DIAGNOSIS — M47814 Spondylosis without myelopathy or radiculopathy, thoracic region: Secondary | ICD-10-CM | POA: Diagnosis not present

## 2024-03-06 ENCOUNTER — Other Ambulatory Visit: Payer: Self-pay

## 2024-03-06 ENCOUNTER — Ambulatory Visit: Attending: Cardiology | Admitting: Cardiology

## 2024-03-06 ENCOUNTER — Encounter: Payer: Self-pay | Admitting: Cardiology

## 2024-03-06 VITALS — BP 155/78 | HR 57 | Ht <= 58 in | Wt 125.2 lb

## 2024-03-06 DIAGNOSIS — R600 Localized edema: Secondary | ICD-10-CM | POA: Diagnosis not present

## 2024-03-06 DIAGNOSIS — L409 Psoriasis, unspecified: Secondary | ICD-10-CM

## 2024-03-06 DIAGNOSIS — R002 Palpitations: Secondary | ICD-10-CM

## 2024-03-06 DIAGNOSIS — I2510 Atherosclerotic heart disease of native coronary artery without angina pectoris: Secondary | ICD-10-CM | POA: Diagnosis not present

## 2024-03-06 DIAGNOSIS — R0602 Shortness of breath: Secondary | ICD-10-CM

## 2024-03-06 DIAGNOSIS — I10 Essential (primary) hypertension: Secondary | ICD-10-CM

## 2024-03-06 DIAGNOSIS — K219 Gastro-esophageal reflux disease without esophagitis: Secondary | ICD-10-CM

## 2024-03-06 DIAGNOSIS — E782 Mixed hyperlipidemia: Secondary | ICD-10-CM

## 2024-03-06 MED ORDER — AMLODIPINE BESYLATE 2.5 MG PO TABS: 2.5000 mg | ORAL_TABLET | Freq: Two times a day (BID) | ORAL | 3 refills | Status: AC

## 2024-03-06 MED ORDER — METOPROLOL TARTRATE 100 MG PO TABS: 100.0000 mg | ORAL_TABLET | Freq: Two times a day (BID) | ORAL | 3 refills | Status: AC

## 2024-03-06 MED ORDER — COSENTYX UNOREADY 300 MG/2ML ~~LOC~~ SOAJ: 300.0000 mg | SUBCUTANEOUS | 0 refills | Status: DC

## 2024-03-06 NOTE — Patient Instructions (Addendum)
 Medication Instructions:  Your physician has recommended you make the following change in your medication:   INCREASE Metoprolol  to 100 mg twice a day   *If you need a refill on your cardiac medications before your next appointment, please call your pharmacy*  Lab Work: None  If you have labs (blood work) drawn today and your tests are completely normal, you will receive your results only by: MyChart Message (if you have MyChart) OR A paper copy in the mail If you have any lab test that is abnormal or we need to change your treatment, we will call you to review the results.  Testing/Procedures: Your physician has requested that you have an echocardiogram. Echocardiography is a painless test that uses sound waves to create images of your heart. It provides your doctor with information about the size and shape of your heart and how well your heart's chambers and valves are working. This procedure takes approximately one hour. There are no restrictions for this procedure. Please do NOT wear cologne, perfume, aftershave, or lotions (deodorant is allowed). Please arrive 15 minutes prior to your appointment time.  Please note: We ask at that you not bring children with you during ultrasound (echo/ vascular) testing. Due to room size and safety concerns, children are not allowed in the ultrasound rooms during exams. Our front office staff cannot provide observation of children in our lobby area while testing is being conducted. An adult accompanying a patient to their appointment will only be allowed in the ultrasound room at the discretion of the ultrasound technician under special circumstances. We apologize for any inconvenience.    Follow-Up: At [facility], you and your health needs are our priority.  As part of our continuing mission to provide you with exceptional heart care, our providers are all part of one team.  This team includes your primary Cardiologist (physician) and  Advanced Practice Providers or APPs (Physician Assistants and Nurse Practitioners) who all work together to provide you with the care you need, when you need it.  Your next appointment:   6 week(s)  Provider:   Lonni Hanson, MD or Tylene Lunch, NP

## 2024-03-06 NOTE — Progress Notes (Signed)
 Cardiology Office Note   Date:  03/06/2024  ID:  Lindsay Tran, Lindsay Tran [birth year], MRN [id number] PCP: Abbey Bruckner, MD   [facility]Care Providers Cardiologist:  Lonni Hanson, MD     History of Present Illness Lindsay Tran is a 78 y.o. female with a past medical history of coronary calcification, hypertension, hyperlipidemia, varicose veins, peptic ulcer disease, iron deficiency anemia, essential tremor, scoliosis, palpitations, dizziness, and symptomatic arthritis, who is here today to follow-up on coronary artery calcifications.   Prior echocardiogram revealed LVEF 55 to 60%, G2 DD, mild mitral regurgitation.  Heart monitor revealed predominant sinus rhythm with an average heart rate of 65 bpm.  She did have 27 supraventricular runs that were observed lasting up to 19 beats and several episodes of brief PSVT as well as rare PACs PVCs. ABI's were completed in 04/2022 the right ankle-brachial index was noncompressible on the right lower extremity and the right toe brachial index was normal, left first ankle-brachial index were noncompressible lower extremity arteries on the left normal left toe brachial index.  She was seen in clinic April 2025 doing fairly well.  She did notice an uptick in her blood pressure since her gastroenterologist placed her on Cymbalta.  Blood pressure log revealed blood pressures at home in the 130s to 160s.  She denies any other associated symptoms.  Amlodipine  was increased to 2.5 mg twice daily and metoprolol  100 mg and additional 50 as needed.  She was encouraged to continue blood pressure monitoring at home.   She was last seen in clinic 12/05/2023 and she been doing overall well from the cardiac perspective.  Since adjustment in her blood pressure medication blood pressure been well-controlled.  She had started to have an increase amount of palpitations and irregular heartbeats without any other associated symptoms.  She was placed on a ZIO XT monitor for 2  weeks to rule out any type of arrhythmia.  She returns to clinic today after recently coming home from an [place] cruise and the plane ride had noted that she was swelling to her bilateral lower extremities more than usual.  She states over the past couple of weeks since that she has started having some worsening shortness of breath with its accompanying peripheral edema.  Since increasing her metoprolol  after having a ZIO monitor her palpitations have improved.  She denies any chest pain, chest pressure, lightheadedness and dizziness.  She states she has been compliant with her current medication regimen without any undue side effects.  She denies any hospitalizations or visits to the emergency department.  ROS: 10 point review of systems has been reviewed and considered negative exception was been listed in the HPI  Studies Reviewed EKG Interpretation Date/Time:  Thursday March 06 2024 11:48:28 EDT Ventricular Rate:  57 PR Interval:  148 QRS Duration:  82 QT Interval:  446 QTC Calculation: 434 R Axis:   20  Text Interpretation: Sinus bradycardia When compared with ECG of 31-Jul-2023 14:34, No acute changes Confirmed by Gerard Frederick ([id number]) on 03/06/2024 11:52:04 AM    Event Monitor (Zio) 01/04/2024   The patient was monitored for 14 days.   The predominant rhythm was sinus with an average rate of 68 bpm (range 52-107 bpm in sinus).   There were rare PACs and PVCs.   A single 6 beat run of nonsustained ventricular tachycardia occurred with a maximum rate of 156 bpm.   There were 29 supraventricular runs, lasting up to 26.6 seconds with a maximum rate of 132  bpm.   No sustained arrhythmia or prolonged pause was observed.   Patient triggered events correspond to sinus rhythm and PACs.   Predominantly sinus rhythm with rare PACs and PVCs.  Several episodes of PSVT were noted as well as a single brief run of nonsustained ventricular tachycardia, as detailed above.   Carotid Duplex  08/28/2023 Summary:  Right Carotid: There was no evidence of thrombus, dissection,  atherosclerotic plaque or stenosis in the cervical carotid system.   Left Carotid: There was no evidence of thrombus, dissection,  atherosclerotic plaque or stenosis in the cervical carotid system.   Vertebrals:  Bilateral vertebral arteries demonstrate antegrade flow.  Subclavians: Normal flow hemodynamics were seen in bilateral subclavian               arteries.   TTE 02/08/22  1. Left ventricular ejection fraction, by estimation, is 55 to 60%. The  left ventricle has normal function. The left ventricle has no regional  wall motion abnormalities. Left ventricular diastolic parameters are  consistent with Grade II diastolic  dysfunction (pseudonormalization).   2. Right ventricular systolic function is normal. The right ventricular  size is normal.   3. Left atrial size was moderately dilated.   4. The mitral valve is normal in structure. Mild mitral valve  regurgitation.   5. The aortic valve is tricuspid. Aortic valve regurgitation is not  visualized. Aortic valve sclerosis is present, with no evidence of aortic  valve stenosis.   6. The inferior vena cava is normal in size with greater than 50%  respiratory variability, suggesting right atrial pressure of 3 mmHg Risk Assessment/Calculations     Physical Exam VS:  BP (!) 155/78   Pulse (!) 57   Ht 4' 10 (1.473 m)   Wt 125 lb 3.2 oz (56.8 kg)   SpO2 98%   BMI 26.17 kg/m        Wt Readings from Last 3 Encounters:  03/06/24 125 lb 3.2 oz (56.8 kg)  02/20/24 122 lb (55.3 kg)  12/05/23 131 lb 6.4 oz (59.6 kg)    GEN: Well nourished, well developed in no acute distress NECK: No JVD; No carotid bruits CARDIAC: RRR, I/VI systolic murmur RUSB, without rubs or gallops RESPIRATORY:  Clear to auscultation without rales, wheezing or rhonchi  ABDOMEN: Soft, non-tender, non-distended EXTREMITIES: Trace pretibial edema; No deformity   ASSESSMENT  AND PLAN Shortness of breath peripheral edema noticed over the last month.  Shortness of breath has not been exertional when she is trying to do her housework which is new for her.  At this time she has been scheduled for an updated echocardiogram to assess LVEF.  Primary hypertension with blood pressure today 155/78.  Patient stated that she had taken her medication on the way to her appointment today.  She is continued on amlodipine  2.5 mg twice daily and metoprolol  tartrate 100 mg twice daily.  Blood pressures are typically well-controlled at home.  She is requesting refills on her medications today.  She has been encouraged to continue to monitor her blood pressures 1 to 2 hours postmedication administration at home as well.  Mixed hyperlipidemia with an LDL of 20.  She is continued on Repatha  140 mg every 2 weeks and rosuvastatin  5 mg 3 times per week.  Coronary calcifications noted on coronary CTA she is continued on Repatha  and rosuvastatin .  She is no longer taking aspirin 81 mg due to longstanding history of peptic ulcer disease.  EKG today reveals sinus bradycardia with  a rate of 57 with no ischemic changes.  No further ischemic testing needed at this time as patient has remained chest pain-free.  Palpitations where she recently wore a ZIO XT monitor that revealed an average heart rate of 68 beats per minute with 1 6 beat run of nonsustained V. tach, and 29 supraventricular runs lasting up to 26.6 seconds.  At that time metoprolol  was increased to 100 mg twice daily.  Since increasing metoprolol  palpitations have ultimately resolved.  LPR which she continues to be followed by [facility].  Ongoing management per provider [facility].  Patient stated that she was supposed to get an injection in her esophagus and her provider was questioning whether it was safe for her to be placed to sleep or if she needed any cardiac clearance.  She has been advised that after echocardiogram is completed and can be determined  at that time.  Peripheral edema that had worsened over cruise and flight.  She has been encouraged to continue with conservative therapy and has been wearing her compression stockings.  She states that slowly since she has been wearing her compression stockings that the swelling has improved.       Dispo: Patient to return to clinic to see MD/APP in 6 weeks or sooner if needed for further evaluation after echocardiogram has been completed.  Signed, Karimah Winquist, NP

## 2024-03-07 ENCOUNTER — Ambulatory Visit (INDEPENDENT_AMBULATORY_CARE_PROVIDER_SITE_OTHER)

## 2024-03-07 VITALS — BP 102/80 | HR 62 | Ht <= 58 in | Wt 124.8 lb

## 2024-03-07 DIAGNOSIS — I2510 Atherosclerotic heart disease of native coronary artery without angina pectoris: Secondary | ICD-10-CM | POA: Diagnosis not present

## 2024-03-07 DIAGNOSIS — L409 Psoriasis, unspecified: Secondary | ICD-10-CM | POA: Diagnosis not present

## 2024-03-07 DIAGNOSIS — E785 Hyperlipidemia, unspecified: Secondary | ICD-10-CM | POA: Diagnosis not present

## 2024-03-07 DIAGNOSIS — N952 Postmenopausal atrophic vaginitis: Secondary | ICD-10-CM | POA: Diagnosis not present

## 2024-03-07 DIAGNOSIS — M419 Scoliosis, unspecified: Secondary | ICD-10-CM | POA: Diagnosis not present

## 2024-03-07 DIAGNOSIS — Z780 Asymptomatic menopausal state: Secondary | ICD-10-CM | POA: Diagnosis not present

## 2024-03-07 DIAGNOSIS — N3946 Mixed incontinence: Secondary | ICD-10-CM | POA: Insufficient documentation

## 2024-03-07 DIAGNOSIS — K219 Gastro-esophageal reflux disease without esophagitis: Secondary | ICD-10-CM

## 2024-03-07 DIAGNOSIS — L309 Dermatitis, unspecified: Secondary | ICD-10-CM | POA: Diagnosis not present

## 2024-03-07 DIAGNOSIS — R251 Tremor, unspecified: Secondary | ICD-10-CM | POA: Diagnosis not present

## 2024-03-07 DIAGNOSIS — R7303 Prediabetes: Secondary | ICD-10-CM | POA: Diagnosis not present

## 2024-03-07 DIAGNOSIS — R3915 Urgency of urination: Secondary | ICD-10-CM

## 2024-03-07 LAB — COMPREHENSIVE METABOLIC PANEL WITH GFR
ALT: 12 U/L (ref 0–35)
AST: 17 U/L (ref 0–37)
Albumin: 4.1 g/dL (ref 3.5–5.2)
Alkaline Phosphatase: 60 U/L (ref 39–117)
BUN: 14 mg/dL (ref 6–23)
CO2: 31 meq/L (ref 19–32)
Calcium: 9 mg/dL (ref 8.4–10.5)
Chloride: 98 meq/L (ref 96–112)
Creatinine, Ser: 0.55 mg/dL (ref 0.40–1.20)
GFR: 86.48 mL/min (ref >60.00)
Glucose, Bld: 86 mg/dL (ref 70–99)
Potassium: 4.4 meq/L (ref 3.5–5.1)
Sodium: 138 meq/L (ref 135–145)
Total Bilirubin: 0.5 mg/dL (ref 0.2–1.2)
Total Protein: 6.7 g/dL (ref 6.0–8.3)

## 2024-03-07 LAB — URINALYSIS, ROUTINE W REFLEX MICROSCOPIC
Bilirubin Urine: NEGATIVE
Hgb urine dipstick: NEGATIVE
Ketones, ur: NEGATIVE
Leukocytes,Ua: NEGATIVE
Nitrite: NEGATIVE
Specific Gravity, Urine: 1.01 (ref 1.000–1.030)
Total Protein, Urine: NEGATIVE
Urine Glucose: NEGATIVE
Urobilinogen, UA: 0.2 (ref 0.0–1.0)
pH: 7 (ref 5.0–8.0)

## 2024-03-07 LAB — HEMOGLOBIN A1C: Hgb A1c MFr Bld: 6.1 % (ref 4.6–6.5)

## 2024-03-07 MED ORDER — MUPIROCIN 2 % EX OINT: 1.0000 | TOPICAL_OINTMENT | Freq: Two times a day (BID) | CUTANEOUS | 1 refills | Status: AC | PRN

## 2024-03-07 MED ORDER — PANTOPRAZOLE SODIUM 40 MG PO TBEC: 40.0000 mg | DELAYED_RELEASE_TABLET | Freq: Every day | ORAL | 3 refills | Status: AC

## 2024-03-07 MED ORDER — ESTRADIOL 0.1 MG/GM VA CREA: TOPICAL_CREAM | VAGINAL | 1 refills | Status: AC

## 2024-03-07 MED ORDER — REPATHA SURECLICK 140 MG/ML ~~LOC~~ SOAJ: 140.0000 mg | SUBCUTANEOUS | 2 refills | Status: AC

## 2024-03-07 MED ORDER — ESTRADIOL 0.1 MG/GM VA CREA: TOPICAL_CREAM | VAGINAL | 1 refills | Status: DC

## 2024-03-07 NOTE — Assessment & Plan Note (Signed)
 Prn Mupirocin  twice a day during flare up of nasal mucosa dermatitis

## 2024-03-07 NOTE — Patient Instructions (Addendum)
 1) YOUR BONE DENISTY SCAN (dexa)  IS DUE, PLEASE CALL AND GET THIS SCHEDULED! [facility] - call [phone]    2) For urinary urgency, incontinence: Start at home pelvic floor exercise as discussed during your appointment.  I am checking your urine to rule out urinary tract infection. I will update you of results.  I am referring you to physical therapy to get pelvic floor exercise and urologist to further evaluate into worsening urinary symptoms.  Please use vaginal estrogen cream, three times a week.   3) I recommend follow up in 4 months.

## 2024-03-07 NOTE — Assessment & Plan Note (Signed)
 Lipid panel from 08/2023 with LDL within goal.  Continue Repatha  140 mg twice monthly (refill sent) and Crestor  5 mg three times a week (refill through cardiology).

## 2024-03-07 NOTE — Assessment & Plan Note (Signed)
 SABRA

## 2024-03-07 NOTE — Progress Notes (Signed)
 Established Patient Office Visit TOC from Dr. Maribeth    Subjective  Patient ID: Lindsay Tran, female    DOB: 05-09-1946  Age: 78 y.o. MRN: [id number]  Chief Complaint  Patient presents with   Urinary Incontinence   Establish Care    She  has a past medical history of Abdominal pain, Adjustment disorder with anxiety, Anemia, Anxiety, Arthritis, Atrophic vaginitis, Basal cell carcinoma (08/27/2023), BPPV (benign paroxysmal positional vertigo), CAD in native artery, Cataract, Chronic diarrhea, Chronic pain of right knee, Coronary artery disease, Dysphagia, Dysphasia, Dysuria, Family history of colon cancer, Gastric ulcer (1980's), Gastric ulcer, Gastroesophageal hernia, GERD (gastroesophageal reflux disease), High cholesterol, Hyperlipidemia, Hypertension, Macular degeneration, Osteoporosis, Osteoporosis, Pedal edema, Psoriasis, Schatzki's ring (05/02/2013), Schatzki's ring, Scoliosis, Scoliosis, and UTI (urinary tract infection).  HPI Discussed the use of AI scribe software for clinical note transcription with the patient, who gave verbal consent to proceed.  History of Present Illness Lindsay Tran is an 78 year old female who presents for transfer of care and had concerns about worsening bladder control issues.  Over the past six months, she has experienced increased urinary urgency, characterized by sudden urges to urinate without prior warning, such as when getting out of the car or sitting up in bed. This urgency has led to episodes of incontinence, including a recent incident after a long car ride where she was unable to reach a bathroom in time. She reports pain in the lower pelvic area, particularly in the morning upon sitting up, but denies any burning sensation during urination. She has been prescribed vaginal estrogen cream, which she applies twice a week, although she sometimes forgets to use it consistently. She recalls that during a previous visit, she was told she had  significant atrophy. No h/o pelvic floor therapy, urology evaluation.   She has a h/o HTN, takes Amlodipine  2.5 mg BID, metoprolol  tartrate 100 mg BID. Has hyperlipidemia treated with Repatha  140 mg twice monthly and Crestor  5 mg three times a week.  She is established with cardiology. She had an appointment with cardio on 03/06/24. Due to recent worsening shortness of breath she is undergoing cardiac echocardiogram. She also has a h/o CAD, intolerant to aspirin due to PUD history.   She has a h/o IBS, diarrhea predominant, SIBO (in the past), GERD, dysphagia, dysgeusia  h/o lap HH repair with fundoplication on 05/22/13. Currently on Protonix  40 mg daily which she takes in AM. She f/u with both [facility] and [facility] (Dr Onita). She has upcoming EGD scheduled for 04/18/24 with [facility] - .   She has a h/o tremor, chronic tinnitus, vertigo, peripheral neuropathy  and is established with [facility]. She is currently on Gabapentin 900 mg AM, 600 mg afternoon and 900 mg PM based on [facility] note form 02/04/24. She is also on Cymbalta 20 mg daily.   She has h/o psoriasis, sees dermatology for this.Has multiple steroid cream and is on Cosentyx . Sees Dr. Jackquline.    She is established with [facility] for management of thoracic scoliosis, left peroneal tendonitis. She is also established with PMR and is on muscle relaxant, recently started on small dose of Meloxicam for left peroneal tendonitis.   She lives in a condo with family nearby, including a daughter who lives close by and a son in the same town. She stays active by working in her yard.  ROS As per HPI    Objective:     BP 102/80 (BP Location: Left Arm, Patient Position: Sitting,  Cuff Size: Small)   Pulse 62   Ht 4' 10 (1.473 m)   Wt 124 lb 12.8 oz (56.6 kg)   SpO2 96%   BMI 26.08 kg/m      03/07/2024    9:15 AM 02/20/2024    1:44 PM 07/16/2023   11:20 AM  Depression screen PHQ 2/9  Decreased Interest 0 0   Down, Depressed,  Hopeless 0 0 0  PHQ - 2 Score 0 0 0  Altered sleeping 0 0 0  Tired, decreased energy 1 0 1  Change in appetite 0 0 1  Feeling bad or failure about yourself  0 0 0  Trouble concentrating 1 0 2  Moving slowly or fidgety/restless 0 0 0  Suicidal thoughts 0 0 0  PHQ-9 Score 2 0 4  Difficult doing work/chores Not difficult at all Not difficult at all Not difficult at all      03/07/2024    9:15 AM 07/16/2023   11:20 AM 05/22/2023   11:26 AM 04/24/2023   11:07 AM  GAD 7 : Generalized Anxiety Score  Nervous, Anxious, on Edge 0 1 1 1   Control/stop worrying 0 1 1 0  Worry too much - different things 0 1 1 1   Trouble relaxing 0 0 1 0  Restless 0 0 1 1  Easily annoyed or irritable 0 0 0 1  Afraid - awful might happen 0 0 0 0  Total GAD 7 Score 0 3 5 4   Anxiety Difficulty Not difficult at all Not difficult at all Somewhat difficult Somewhat difficult      03/07/2024    9:15 AM 02/20/2024    1:44 PM 07/16/2023   11:20 AM  Depression screen PHQ 2/9  Decreased Interest 0 0   Down, Depressed, Hopeless 0 0 0  PHQ - 2 Score 0 0 0  Altered sleeping 0 0 0  Tired, decreased energy 1 0 1  Change in appetite 0 0 1  Feeling bad or failure about yourself  0 0 0  Trouble concentrating 1 0 2  Moving slowly or fidgety/restless 0 0 0  Suicidal thoughts 0 0 0  PHQ-9 Score 2 0 4  Difficult doing work/chores Not difficult at all Not difficult at all Not difficult at all      03/07/2024    9:15 AM 07/16/2023   11:20 AM 05/22/2023   11:26 AM 04/24/2023   11:07 AM  GAD 7 : Generalized Anxiety Score  Nervous, Anxious, on Edge 0 1 1 1   Control/stop worrying 0 1 1 0  Worry too much - different things 0 1 1 1   Trouble relaxing 0 0 1 0  Restless 0 0 1 1  Easily annoyed or irritable 0 0 0 1  Afraid - awful might happen 0 0 0 0  Total GAD 7 Score 0 3 5 4   Anxiety Difficulty Not difficult at all Not difficult at all Somewhat difficult Somewhat difficult   SDOH Screenings   Food Insecurity: No Food  Insecurity (02/13/2024)  Housing: Low Risk  (02/13/2024)  Transportation Needs: No Transportation Needs (02/13/2024)  Utilities: Not At Risk (02/20/2024)  Alcohol Screen: Low Risk  (02/13/2024)  Depression (PHQ2-9): Low Risk  (03/07/2024)  Financial Resource Strain: Low Risk  (02/13/2024)  Physical Activity: Insufficiently Active (02/13/2024)  Social Connections: Moderately Isolated (02/13/2024)  Stress: No Stress Concern Present (02/13/2024)  Tobacco Use: Low Risk  (03/07/2024)  Health Literacy: Adequate Health Literacy (02/20/2024)     Physical Exam  Constitutional:      General: She is not in acute distress. HENT:     Head: Normocephalic and atraumatic.     Mouth/Throat:     Mouth: Mucous membranes are moist.     Pharynx: Oropharynx is clear.  Cardiovascular:     Rate and Rhythm: Normal rate.  Pulmonary:     Breath sounds: No wheezing.  Abdominal:     General: Bowel sounds are normal.     Palpations: Abdomen is soft.     Tenderness: There is no abdominal tenderness. There is no right CVA tenderness, left CVA tenderness, guarding or rebound.  Musculoskeletal:     Right lower leg: No edema.     Left lower leg: No edema.  Skin:    General: Skin is warm.  Neurological:     Mental Status: She is alert and oriented to person, place, and time.  Psychiatric:        Mood and Affect: Mood normal.        No results found for any visits on 03/07/24.  The ASCVD Risk score (Arnett DK, et al., 2019) failed to calculate for the following reasons:   The 2019 ASCVD risk score is only valid for ages 49 to 61     Assessment & Plan:    Urinary urgency Assessment & Plan: - Order urine analysis to rule out UTI. - Refer to pelvic floor therapy at [facility] rehab center. - Refer to urologist for further evaluation. - Provide Kegel exercises for pelvic floor strengthening. - Continue vaginal estrogen cream three times a week.  Orders: -     Urinalysis, Routine w reflex microscopic -     Urine  Culture -     Ambulatory referral to Occupational Therapy -     Hemoglobin A1c  Hyperlipidemia, unspecified hyperlipidemia type Assessment & Plan: Lipid panel from 08/2023 with LDL within goal.  Continue Repatha  140 mg twice monthly (refill sent) and Crestor  5 mg three times a week (refill through cardiology).  Orders: -     Repatha  SureClick; Inject 140 mg into the skin every 14 (fourteen) days.  Dispense: 6 mL; Refill: 2 -     Comprehensive metabolic panel with GFR  CAD in native artery Assessment & Plan: Continue f/u with cardiology.  Continue Repatha  140 mg twice monthly and Crestor  5 mg three times a week.  Lipid panel from 08/28/23 with LDL within goal.  H/O PUD, chronic GERD unable to tolerate Aspirin.   Orders: -     Repatha  SureClick; Inject 140 mg into the skin every 14 (fourteen) days.  Dispense: 6 mL; Refill: 2  Mixed stress and urge urinary incontinence Assessment & Plan: - Symptoms worsening over the last 6 months. - Order urine analysis, culture to rule out UTI. Check CMP.  - Refer to pelvic floor therapy referral made.  - Refer to urologist for further evaluation. - Provide Kegel exercises for pelvic floor strengthening. - Continue vaginal estrogen cream three times a week.   Tremor Assessment & Plan: Managed by neurology though [facility]/ Dr. Lane   Prediabetes Assessment & Plan: Check A1c  Orders: -     Hemoglobin A1c  Postmenopausal estrogen deficiency Assessment & Plan: Last DEXA 2017, needs repeat DEXA, imaging ordered. Patient provided with phone number to call to schedule an appointment for DEXA.   Orders: -     DG Bone Density; Future  Atrophic vaginitis Assessment & Plan: Continue vaginal estrogen, refill sent.   Orders: -  Estradiol ; 1 applicatorful three times weekly  Dispense: 127.5 g; Refill: 1  Laryngopharyngeal reflux Assessment & Plan: Continue f/u with [facility] and [facility], continue Protonix  40 mg daily, refill  sent.   Orders: -     Pantoprazole  Sodium; Take 1 tablet (40 mg total) by mouth daily.  Dispense: 90 tablet; Refill: 3  Psoriasis Assessment & Plan: Management per dermatologist, Dr. Jackquline.    Scoliosis, unspecified scoliosis type, unspecified spinal region Assessment & Plan: Management per PMR, [facility].    Dermatitis Assessment & Plan: Prn Mupirocin  twice a day during flare up of nasal mucosa dermatitis   Orders: -     Mupirocin ; Apply 1 Application topically 2 (two) times daily as needed. To nose  Dispense: 180 g; Refill: 1    Return in about 4 months (around 07/07/2024) for Chronic follow up .   Luke Shade, MD

## 2024-03-07 NOTE — Assessment & Plan Note (Signed)
 Continue f/u with [facility] and [facility], continue Protonix  40 mg daily, refill sent.

## 2024-03-07 NOTE — Assessment & Plan Note (Addendum)
-   Order urine analysis to rule out UTI. - Refer to pelvic floor therapy at [facility] rehab center. - Refer to urologist for further evaluation. - Provide Kegel exercises for pelvic floor strengthening. - Continue vaginal estrogen cream three times a week.

## 2024-03-07 NOTE — Assessment & Plan Note (Signed)
-   Symptoms worsening over the last 6 months. - Order urine analysis, culture to rule out UTI. Check CMP.  - Refer to pelvic floor therapy referral made.  - Refer to urologist for further evaluation. - Provide Kegel exercises for pelvic floor strengthening. - Continue vaginal estrogen cream three times a week.

## 2024-03-07 NOTE — Assessment & Plan Note (Signed)
 Management per dermatologist, Dr. Jackquline.

## 2024-03-07 NOTE — Assessment & Plan Note (Signed)
 Management per PMR, [facility].

## 2024-03-07 NOTE — Assessment & Plan Note (Signed)
 Last DEXA 2017, needs repeat DEXA, imaging ordered. Patient provided with phone number to call to schedule an appointment for DEXA.

## 2024-03-07 NOTE — Assessment & Plan Note (Signed)
 Managed by neurology though [facility] clinic/ Dr. Lane

## 2024-03-07 NOTE — Assessment & Plan Note (Signed)
 Continue f/u with cardiology.  Continue Repatha  140 mg twice monthly and Crestor  5 mg three times a week.  Lipid panel from 08/28/23 with LDL within goal.  H/O PUD, chronic GERD unable to tolerate Aspirin.

## 2024-03-07 NOTE — Assessment & Plan Note (Signed)
 Check A1c.

## 2024-03-07 NOTE — Assessment & Plan Note (Signed)
 Continue vaginal estrogen, refill sent.

## 2024-03-08 LAB — URINE CULTURE
MICRO NUMBER:: 16902567
Result:: NO GROWTH
SPECIMEN QUALITY:: ADEQUATE

## 2024-03-10 ENCOUNTER — Ambulatory Visit: Payer: Self-pay

## 2024-03-10 DIAGNOSIS — M810 Age-related osteoporosis without current pathological fracture: Secondary | ICD-10-CM

## 2024-03-10 NOTE — Progress Notes (Signed)
 Please let the patient know urine is negative for urinary tract infection. A1c is slightly elevated from her baseline but still in prediabetic range. I will continue to monitor this periodically. Kidney, liver function, electrolytes looks normal.   Thank you,  Luke Shade, MD

## 2024-03-11 NOTE — Progress Notes (Signed)
 Pt notified via MyChart.

## 2024-03-18 DIAGNOSIS — M205X1 Other deformities of toe(s) (acquired), right foot: Secondary | ICD-10-CM | POA: Diagnosis not present

## 2024-03-18 DIAGNOSIS — S93524S Sprain of metatarsophalangeal joint of right lesser toe(s), sequela: Secondary | ICD-10-CM | POA: Diagnosis not present

## 2024-03-18 DIAGNOSIS — L608 Other nail disorders: Secondary | ICD-10-CM | POA: Diagnosis not present

## 2024-03-18 DIAGNOSIS — S93144A Subluxation of metatarsophalangeal joint of right lesser toe(s), initial encounter: Secondary | ICD-10-CM | POA: Diagnosis not present

## 2024-03-18 DIAGNOSIS — M2041 Other hammer toe(s) (acquired), right foot: Secondary | ICD-10-CM | POA: Diagnosis not present

## 2024-03-18 DIAGNOSIS — M19071 Primary osteoarthritis, right ankle and foot: Secondary | ICD-10-CM | POA: Diagnosis not present

## 2024-03-18 DIAGNOSIS — M7741 Metatarsalgia, right foot: Secondary | ICD-10-CM | POA: Diagnosis not present

## 2024-03-18 DIAGNOSIS — M2141 Flat foot [pes planus] (acquired), right foot: Secondary | ICD-10-CM | POA: Diagnosis not present

## 2024-03-18 DIAGNOSIS — M7672 Peroneal tendinitis, left leg: Secondary | ICD-10-CM | POA: Diagnosis not present

## 2024-03-18 DIAGNOSIS — M2011 Hallux valgus (acquired), right foot: Secondary | ICD-10-CM | POA: Diagnosis not present

## 2024-03-19 ENCOUNTER — Encounter: Payer: Self-pay | Admitting: Oncology

## 2024-03-25 ENCOUNTER — Encounter: Payer: Self-pay | Admitting: Oncology

## 2024-03-25 ENCOUNTER — Ambulatory Visit (INDEPENDENT_AMBULATORY_CARE_PROVIDER_SITE_OTHER): Admitting: Dermatology

## 2024-03-25 DIAGNOSIS — L814 Other melanin hyperpigmentation: Secondary | ICD-10-CM

## 2024-03-25 DIAGNOSIS — D229 Melanocytic nevi, unspecified: Secondary | ICD-10-CM

## 2024-03-25 DIAGNOSIS — Z85828 Personal history of other malignant neoplasm of skin: Secondary | ICD-10-CM | POA: Diagnosis not present

## 2024-03-25 DIAGNOSIS — L578 Other skin changes due to chronic exposure to nonionizing radiation: Secondary | ICD-10-CM

## 2024-03-25 DIAGNOSIS — Z79899 Other long term (current) drug therapy: Secondary | ICD-10-CM

## 2024-03-25 DIAGNOSIS — Z1283 Encounter for screening for malignant neoplasm of skin: Secondary | ICD-10-CM | POA: Diagnosis not present

## 2024-03-25 DIAGNOSIS — W908XXA Exposure to other nonionizing radiation, initial encounter: Secondary | ICD-10-CM

## 2024-03-25 DIAGNOSIS — D1801 Hemangioma of skin and subcutaneous tissue: Secondary | ICD-10-CM | POA: Diagnosis not present

## 2024-03-25 DIAGNOSIS — L821 Other seborrheic keratosis: Secondary | ICD-10-CM | POA: Diagnosis not present

## 2024-03-25 DIAGNOSIS — L409 Psoriasis, unspecified: Secondary | ICD-10-CM

## 2024-03-25 DIAGNOSIS — Z7189 Other specified counseling: Secondary | ICD-10-CM

## 2024-03-25 DIAGNOSIS — L720 Epidermal cyst: Secondary | ICD-10-CM | POA: Diagnosis not present

## 2024-03-25 MED ORDER — CLOBETASOL PROPIONATE 0.05 % EX FOAM: CUTANEOUS | 3 refills | Status: AC

## 2024-03-25 MED ORDER — COSENTYX UNOREADY 300 MG/2ML ~~LOC~~ SOAJ: 300.0000 mg | SUBCUTANEOUS | 5 refills | Status: AC

## 2024-03-25 NOTE — Patient Instructions (Signed)

## 2024-03-25 NOTE — Progress Notes (Signed)
 Follow-Up Visit   Subjective  Lindsay Tran is a 78 y.o. female who presents for the following: Skin Cancer Screening and Full Body Skin Exam  The patient presents for Total-Body Skin Exam (TBSE) for skin cancer screening and mole check. The patient has spots, moles and lesions to be evaluated, some may be new or changing. History of BCC of the left nasal ala. Patient is on Cosentyx  injections for psoriasis (last injection 03/16/24 - out of refills), also using clobetasol  ointment and triamcinolone  ointment. She is having a lot of breakouts in scalp, and would like another refill of clobetasol  foam.     The following portions of the chart were reviewed this encounter and updated as appropriate: medications, allergies, medical history  Review of Systems:  No other skin or systemic complaints except as noted in HPI or Assessment and Plan.  Objective  Well appearing patient in no apparent distress; mood and affect are within normal limits.  A full examination was performed including scalp, head, eyes, ears, nose, lips, neck, chest, axillae, abdomen, back, buttocks, bilateral upper extremities, bilateral lower extremities, hands, feet, fingers, toes, fingernails, and toenails. All findings within normal limits unless otherwise noted below.   Relevant physical exam findings are noted in the Assessment and Plan.    Assessment & Plan   SKIN CANCER SCREENING PERFORMED TODAY.  ACTINIC DAMAGE - Chronic condition, secondary to cumulative UV/sun exposure - diffuse scaly erythematous macules with underlying dyspigmentation - Recommend daily broad spectrum sunscreen SPF 30+ to sun-exposed areas, reapply every 2 hours as needed.  - Staying in the shade or wearing long sleeves, sun glasses (UVA+UVB protection) and wide brim hats (4-inch brim around the entire circumference of the hat) are also recommended for sun protection.  - Call for new or changing lesions.  LENTIGINES, SEBORRHEIC KERATOSES,  HEMANGIOMAS - Benign normal skin lesions - Benign-appearing - Call for any changes  MELANOCYTIC NEVI - Tan-brown and/or pink-flesh-colored symmetric macules and papules - Benign appearing on exam today - Observation - Call clinic for new or changing moles - Recommend daily use of broad spectrum spf 30+ sunscreen to sun-exposed areas.   Milia - tiny firm white papules at chest - type of cyst - benign - sometimes these will clear with nightly OTC adapalene/Differin 0.1% gel or retinol. - may be extracted if symptomatic - observe  HISTORY OF BASAL CELL CARCINOMA OF THE SKIN Left nasal ala, EDC 09/19/2023 - No evidence of recurrence today - Recommend regular full body skin exams - Recommend daily broad spectrum sunscreen SPF 30+ to sun-exposed areas, reapply every 2 hours as needed.  - Call if any new or changing lesions are noted between office visits  PSORIASIS on Systemic Treatment Exam: Pink scaly patch on left forearm; pink scaly papule at right upper back; mild erythema at occipital hairline; pink scaly patch at right temporal hairline. 1% BSA.  Chronic and persistent condition with duration or expected duration over one year. Condition is symptomatic/ bothersome to patient. Not currently at goal.   Patient with joint pain, but improved on Cosentyx .  Labs reviewed from 02/08/23 (CMP) and 04/24/23 (CBC).  Psoriasis - severe on systemic treatment.  Psoriasis is a chronic non-curable, but treatable genetic/hereditary disease that may have other systemic features affecting other organ systems such as joints (Psoriatic Arthritis).  It is linked with heart disease, inflammatory bowel disease, non-alcoholic fatty liver disease, and depression. Significant skin psoriasis and/or psoriatic arthritis may have significant symptoms and affects activities of daily  activity and often benefits from systemic treatments.  These systemic treatments have some potential side effects including  immunosuppression and require pre-treatment laboratory screening and periodic laboratory monitoring and periodic in person evaluation and monitoring by the attending dermatologist physician (long term medication management).    Treatment Plan: Cont Cosentyx  300mg  sq injections q month 5Rf. Restart Clobetasol  foam to aa scalp/body once to twice daily as needed. Avoid applying to face, groin, and axilla. Use as directed. Long-term use can cause thinning of the skin. Cont TMC 0.1% ointment qd prn flares, avoid f/g/a   Topical steroids (such as triamcinolone , fluocinolone, fluocinonide, mometasone, clobetasol , halobetasol, betamethasone , hydrocortisone) can cause thinning and lightening of the skin if they are used for too long in the same area. Your physician has selected the right strength medicine for your problem and area affected on the body. Please use your medication only as directed by your physician to prevent side effects.   Reviewed risks of biologics including immunosuppression, infections (i.e. TB reactivation), injection site reaction, and failure to improve condition. Goal is control of skin condition, not cure.  Some older biologics such as Humira and Enbrel may slightly increase risk of malignancy and may worsen congestive heart failure.  Taltz, Cosentyx , and Bimzelx may cause inflammatory bowel disease to flare or may increase incidence of yeast infections. Skyrizi, Tremfya, and Stelara may also slightly increase risk of infection. The use of biologics requires long term medication management, including periodic office visits, annual TB screening test and monitoring of blood work.  Monitoring for Tuberculosis Infection:  Patient is currently receiving biologic therapy for psoriasis, which carries a potential risk for reactivation of tuberculosis infection. As part of ongoing safety monitoring, a QuantiFERON-TB Gold (QFT-G) test is checked prior to initiation of biologic therapy and yearly  per guidelines. - Most recent QFT-G result:     Latest Ref Rng & Units 08/28/2023   11:34 AM  Quantiferon TB Gold  Quantiferon TB Gold Plus Negative Negative     - Will recheck yearly  - No signs or symptoms of active TB noted. - Will continue to monitor per standard biologic safety protocol. - If QFT-G becomes positive or patient develops symptoms suggestive of TB, appropriate evaluation will be initiated  Long term medication management.  Patient is using long term (months to years) prescription medication  to control their dermatologic condition.  These medications require periodic monitoring to evaluate for efficacy and side effects and may require periodic laboratory monitoring.      Return in about 6 months (around 09/22/2024) for Psoriasis.  IAndrea Kerns, CMA, am acting as scribe for Rexene Rattler, MD .   Documentation: I have reviewed the above documentation for accuracy and completeness, and I agree with the above.  Rexene Rattler, MD

## 2024-04-09 ENCOUNTER — Ambulatory Visit (HOSPITAL_COMMUNITY)
Admission: RE | Admit: 2024-04-09 | Discharge: 2024-04-09 | Disposition: A | Discharge status: Home / Self Care | Source: Ambulatory Visit | Attending: Cardiovascular Disease | Admitting: Cardiovascular Disease

## 2024-04-09 DIAGNOSIS — R0602 Shortness of breath: Secondary | ICD-10-CM | POA: Diagnosis not present

## 2024-04-09 LAB — ECHOCARDIOGRAM COMPLETE
Area-P 1/2: 2.78 cm2
S' Lateral: 2.8 cm

## 2024-04-10 ENCOUNTER — Ambulatory Visit: Payer: Self-pay | Admitting: Cardiology

## 2024-04-17 ENCOUNTER — Ambulatory Visit: Attending: Cardiology | Admitting: Cardiology

## 2024-04-17 ENCOUNTER — Other Ambulatory Visit

## 2024-04-17 ENCOUNTER — Encounter: Payer: Self-pay | Admitting: Cardiology

## 2024-04-17 VITALS — BP 112/70 | HR 57 | Ht <= 58 in | Wt 122.2 lb

## 2024-04-17 DIAGNOSIS — R600 Localized edema: Secondary | ICD-10-CM

## 2024-04-17 DIAGNOSIS — K219 Gastro-esophageal reflux disease without esophagitis: Secondary | ICD-10-CM | POA: Diagnosis not present

## 2024-04-17 DIAGNOSIS — E782 Mixed hyperlipidemia: Secondary | ICD-10-CM

## 2024-04-17 DIAGNOSIS — I10 Essential (primary) hypertension: Secondary | ICD-10-CM

## 2024-04-17 DIAGNOSIS — R0602 Shortness of breath: Secondary | ICD-10-CM | POA: Diagnosis not present

## 2024-04-17 DIAGNOSIS — I2510 Atherosclerotic heart disease of native coronary artery without angina pectoris: Secondary | ICD-10-CM

## 2024-04-17 DIAGNOSIS — R002 Palpitations: Secondary | ICD-10-CM

## 2024-04-17 NOTE — Patient Instructions (Signed)
 Medication Instructions:   Your physician recommends that you continue on your current medications as directed. Please refer to the Current Medication list given to you today.   *If you need a refill on your cardiac medications before your next appointment, please call your pharmacy*  Lab Work:  No labs ordered today   If you have labs (blood work) drawn today and your tests are completely normal, you will receive your results only by: MyChart Message (if you have MyChart) OR A paper copy in the mail If you have any lab test that is abnormal or we need to change your treatment, we will call you to review the results.  Testing/Procedures:  No test ordered today   Follow-Up: At [facility], you and your health needs are our priority.  As part of our continuing mission to provide you with exceptional heart care, our providers are all part of one team.  This team includes your primary Cardiologist (physician) and Advanced Practice Providers or APPs (Physician Assistants and Nurse Practitioners) who all work together to provide you with the care you need, when you need it.  Your next appointment:    6 month(s)  Provider:    You may see Lonni Hanson, MD or one of the following Advanced Practice Providers on your designated Care Team:   Lonni Meager, NP Lesley Maffucci, PA-C Bernardino Bring, PA-C Cadence [place], PA-C Tylene Lunch, NP Barnie Hila, NP

## 2024-04-17 NOTE — Progress Notes (Signed)
 Cardiology Office Note   Date:  04/17/2024  ID:  Kimiah, Hibner August 03, 1945, MRN [id number] PCP: Abbey Bruckner, MD  [facility] Providers Cardiologist:  Lonni Hanson, MD Cardiology APP:  Gerard Frederick, NP     History of Present Illness Lindsay Tran is a 78 y.o. female with a past medical history of coronary calcification, hypertension, hyperlipidemia, varicose veins, peptic ulcer disease, iron deficiency anemia, essential tremor, scoliosis, palpitations, dizziness, and symptomatic arthritis, who is here today for follow-up of her coronary artery calcifications.   Prior echocardiogram revealed LVEF 55 to 60%, G2 DD, mild mitral regurgitation.  Heart monitor revealed predominant sinus rhythm with an average heart rate of 65 bpm.  She did have 27 supraventricular runs that were observed lasting up to 19 beats and several episodes of brief PSVT as well as rare PACs PVCs. ABI's were completed in 04/2022 the right ankle-brachial index was noncompressible on the right lower extremity and the right toe brachial index was normal, left first ankle-brachial index were noncompressible lower extremity arteries on the left normal left toe brachial index.  She was seen in clinic April 2025 doing fairly well.  She did notice an uptick in her blood pressure since her gastroenterologist placed her on Cymbalta.  Blood pressure log revealed blood pressures at home in the 130s to 160s.  She denies any other associated symptoms.  Amlodipine  was increased to 2.5 mg twice daily and metoprolol  100 mg and additional 50 as needed.  She was encouraged to continue blood pressure monitoring at home.  She was seen in clinic 12/05/2018, doing well with cardiac perspective.  Since adjustment of blood pressure medication blood pressure remains well-controlled.  She continued to have complaints of palpitations and was scheduled for ZIO XT monitor for 2 weeks to rule out arrhythmia.  Monitor revealed an average heart  rate of 68 bpm.  Rare PACs and PVCs.  She had 1 single run of nonsustained ventricular tachycardia and 29 supraventricular tachycardia runs the longest lasting 26.6 seconds.   She was last seen in clinic 03/06/2024 with complaints of swelling to her bilateral lower extremities more than usual.  She states with couple weeks since having started some shortness of breath with accompanying peripheral edema.  With increasing her metoprolol  after her ZIO monitor her palpitations improved.  She was scheduled for an updated echocardiogram with complaints of shortness of breath.  She was also encouraged to participate in conservative therapy for the swelling to her bilateral lower extremities.  She returns to clinic today stating that she has been doing well from a cardiac perspective.  She denies any chest pain, shortness of breath, dizziness/lightheadedness/palpitations or swelling to her lower extremities.  She has been compliant with her current medication regimen without any undue side effects.  She denies any hospitalizations or visits to the emergency department.  She is scheduled for Botox and injections to her esophagus.  She also has a pending upcoming surgery to remove a hammertoe which she is slightly nervous about today.  ROS: 10 point review of systems has been reviewed and considered negative except what is listed in the HPI  Studies Reviewed     2d echo 04/09/2024 1. Left ventricular ejection fraction, by estimation, is 60 to 65%. The  left ventricle has normal function. The left ventricle has no regional  wall motion abnormalities. Left ventricular diastolic parameters are  indeterminate.   2. Right ventricular systolic function is normal. The right ventricular  size is normal. There  is normal pulmonary artery systolic pressure. The  estimated right ventricular systolic pressure is 22.9 mmHg.   3. Left atrial size was mildly dilated.   4. The mitral valve is normal in structure. Mild mitral  valve  regurgitation. No evidence of mitral stenosis.   5. The aortic valve is tricuspid. Aortic valve regurgitation is not  visualized. Aortic valve sclerosis is present, with no evidence of aortic  valve stenosis.   6. The inferior vena cava is normal in size with greater than 50%  respiratory variability, suggesting right atrial pressure of 3 mmHg.   Event Monitor (Zio) 01/04/2024   The patient was monitored for 14 days.   The predominant rhythm was sinus with an average rate of 68 bpm (range 52-107 bpm in sinus).   There were rare PACs and PVCs.   A single 6 beat run of nonsustained ventricular tachycardia occurred with a maximum rate of 156 bpm.   There were 29 supraventricular runs, lasting up to 26.6 seconds with a maximum rate of 132 bpm.   No sustained arrhythmia or prolonged pause was observed.   Patient triggered events correspond to sinus rhythm and PACs.   Predominantly sinus rhythm with rare PACs and PVCs.  Several episodes of PSVT were noted as well as a single brief run of nonsustained ventricular tachycardia, as detailed above.   Carotid Duplex 08/28/2023 Summary:  Right Carotid: There was no evidence of thrombus, dissection,  atherosclerotic plaque or stenosis in the cervical carotid system.   Left Carotid: There was no evidence of thrombus, dissection,  atherosclerotic plaque or stenosis in the cervical carotid system.   Vertebrals:  Bilateral vertebral arteries demonstrate antegrade flow.  Subclavians: Normal flow hemodynamics were seen in bilateral subclavian               arteries.    TTE 02/08/22  1. Left ventricular ejection fraction, by estimation, is 55 to 60%. The  left ventricle has normal function. The left ventricle has no regional  wall motion abnormalities. Left ventricular diastolic parameters are  consistent with Grade II diastolic  dysfunction (pseudonormalization).   2. Right ventricular systolic function is normal. The right ventricular  size is  normal.   3. Left atrial size was moderately dilated.   4. The mitral valve is normal in structure. Mild mitral valve  regurgitation.   5. The aortic valve is tricuspid. Aortic valve regurgitation is not  visualized. Aortic valve sclerosis is present, with no evidence of aortic  valve stenosis.   6. The inferior vena cava is normal in size with greater than 50%  respiratory variability, suggesting right atrial pressure of 3 mmHg  Risk Assessment/Calculations         Physical Exam VS:  BP 112/70   Pulse (!) 57   Ht 4' 10 (1.473 m)   Wt 122 lb 3.2 oz (55.4 kg)   SpO2 98%   BMI 25.54 kg/m        Wt Readings from Last 3 Encounters:  04/17/24 122 lb 3.2 oz (55.4 kg)  03/07/24 124 lb 12.8 oz (56.6 kg)  03/06/24 125 lb 3.2 oz (56.8 kg)    GEN: Well nourished, well developed in no acute distress NECK: No JVD; No carotid bruits CARDIAC: RRR, I/VI systolic murmur RUSB, without rubs or gallops RESPIRATORY:  Clear to auscultation without rales, wheezing or rhonchi  ABDOMEN: Soft, non-tender, non-distended EXTREMITIES:  No edema; No deformity   ASSESSMENT AND PLAN Primary hypertension with blood pressure today 112/70.  Blood pressure has remained stable.  She has continued on amlodipine  2.5 mg twice daily metoprolol  tartrate 100 mg twice daily.  She has been encouraged to monitor pressures 1 to 2 hours postmedication administration at home as well.  Mixed hyperlipidemia with an LDL of 20.  She is continued on Repatha  140 mg every 2 weeks and rosuvastatin  5 mg 3 times per week.  Coronary calcifications noted on coronary CTA continue Repatha  and rosuvastatin .  She is no longer taking aspirin 81 mg due to longstanding history of peptic ulcer disease.  No further ischemic testing needed at this time.  Palpitations prior ZIO XT monitor revealed an average heart rate of 60 bpm with 16 beat run of nonsustained V. tach and 29 supraventricular runs lasting 26.6 seconds.  Metoprolol  was increased  to 100 mg twice daily.  Palpitations have resolved since increasing beta-blocker therapy.  LPR with Botox injection scheduled for tomorrow.  Ongoing management per provider at [facility].  Peripheral edema that has now resolved with conservative therapy.  Shortness of breath that has resolved.  Echocardiogram previously completed revealed an LVEF 60-65%, no RWMA, mild leakage noted in the mitral valve.  No significant change from prior study.       Dispo: Patient to return to clinic to see MD/APP in 6 months or sooner if needed for further evaluation  Signed, Arrington Yohe, NP

## 2024-04-18 DIAGNOSIS — R1310 Dysphagia, unspecified: Secondary | ICD-10-CM | POA: Diagnosis not present

## 2024-04-18 DIAGNOSIS — Z9889 Other specified postprocedural states: Secondary | ICD-10-CM | POA: Diagnosis not present

## 2024-04-18 DIAGNOSIS — K220 Achalasia of cardia: Secondary | ICD-10-CM | POA: Diagnosis not present

## 2024-04-22 DIAGNOSIS — L608 Other nail disorders: Secondary | ICD-10-CM | POA: Diagnosis not present

## 2024-04-22 DIAGNOSIS — G8918 Other acute postprocedural pain: Secondary | ICD-10-CM | POA: Diagnosis not present

## 2024-04-22 DIAGNOSIS — M205X1 Other deformities of toe(s) (acquired), right foot: Secondary | ICD-10-CM | POA: Diagnosis not present

## 2024-04-22 DIAGNOSIS — Z01818 Encounter for other preprocedural examination: Secondary | ICD-10-CM | POA: Diagnosis not present

## 2024-04-22 DIAGNOSIS — M2011 Hallux valgus (acquired), right foot: Secondary | ICD-10-CM | POA: Diagnosis not present

## 2024-04-22 DIAGNOSIS — M2041 Other hammer toe(s) (acquired), right foot: Secondary | ICD-10-CM | POA: Diagnosis not present

## 2024-04-24 ENCOUNTER — Other Ambulatory Visit: Payer: Self-pay

## 2024-04-24 DIAGNOSIS — Z1231 Encounter for screening mammogram for malignant neoplasm of breast: Secondary | ICD-10-CM

## 2024-04-28 DIAGNOSIS — R200 Anesthesia of skin: Secondary | ICD-10-CM | POA: Diagnosis not present

## 2024-04-28 DIAGNOSIS — R251 Tremor, unspecified: Secondary | ICD-10-CM | POA: Diagnosis not present

## 2024-04-28 DIAGNOSIS — R29818 Other symptoms and signs involving the nervous system: Secondary | ICD-10-CM | POA: Diagnosis not present

## 2024-04-28 DIAGNOSIS — R202 Paresthesia of skin: Secondary | ICD-10-CM | POA: Diagnosis not present

## 2024-04-28 DIAGNOSIS — G629 Polyneuropathy, unspecified: Secondary | ICD-10-CM | POA: Diagnosis not present

## 2024-04-30 DIAGNOSIS — G8918 Other acute postprocedural pain: Secondary | ICD-10-CM | POA: Diagnosis not present

## 2024-04-30 DIAGNOSIS — M19071 Primary osteoarthritis, right ankle and foot: Secondary | ICD-10-CM | POA: Diagnosis not present

## 2024-04-30 DIAGNOSIS — L608 Other nail disorders: Secondary | ICD-10-CM | POA: Diagnosis not present

## 2024-04-30 DIAGNOSIS — M2041 Other hammer toe(s) (acquired), right foot: Secondary | ICD-10-CM | POA: Diagnosis not present

## 2024-04-30 DIAGNOSIS — M205X1 Other deformities of toe(s) (acquired), right foot: Secondary | ICD-10-CM | POA: Diagnosis not present

## 2024-04-30 DIAGNOSIS — S93124A Dislocation of metatarsophalangeal joint of right lesser toe(s), initial encounter: Secondary | ICD-10-CM | POA: Diagnosis not present

## 2024-04-30 DIAGNOSIS — M2011 Hallux valgus (acquired), right foot: Secondary | ICD-10-CM | POA: Diagnosis not present

## 2024-05-06 DIAGNOSIS — S93124D Dislocation of metatarsophalangeal joint of right lesser toe(s), subsequent encounter: Secondary | ICD-10-CM | POA: Diagnosis not present

## 2024-05-06 DIAGNOSIS — M2041 Other hammer toe(s) (acquired), right foot: Secondary | ICD-10-CM | POA: Diagnosis not present

## 2024-05-06 DIAGNOSIS — L608 Other nail disorders: Secondary | ICD-10-CM | POA: Diagnosis not present

## 2024-05-06 DIAGNOSIS — Z4889 Encounter for other specified surgical aftercare: Secondary | ICD-10-CM | POA: Diagnosis not present

## 2024-05-06 DIAGNOSIS — M19071 Primary osteoarthritis, right ankle and foot: Secondary | ICD-10-CM | POA: Diagnosis not present

## 2024-05-06 DIAGNOSIS — M205X1 Other deformities of toe(s) (acquired), right foot: Secondary | ICD-10-CM | POA: Diagnosis not present

## 2024-05-09 ENCOUNTER — Telehealth: Payer: Self-pay | Admitting: Pharmacist

## 2024-05-09 ENCOUNTER — Other Ambulatory Visit: Payer: Self-pay | Admitting: Medical Genetics

## 2024-05-09 DIAGNOSIS — Z006 Encounter for examination for normal comparison and control in clinical research program: Secondary | ICD-10-CM

## 2024-05-09 NOTE — Telephone Encounter (Signed)
 [employer] - NEW enrollment   Medication(s): All cholesterol medications (Repatha )   Application Status:  Approved   [employer] ID: [id number] Fund: Hypercholesterolemia - Medicare Access Assistance Type: Co-pay Start Date: 04/09/2024 End Date: 04/08/2025               Rx Card: Card No.  [id number] RX BIN:  [id number] PCN:  [id number] Group:  [id number]    Only other brand medication is Cosentyx . Patient notes her dermatologist helped her apply for PAP in 2025 and she was approved. Is working with her derm for renewal.   All other medications are affordable at this time.    Manuelita FABIENE Kobs, PharmD Clinical Pharmacist [facility] [phone]

## 2024-05-09 NOTE — Telephone Encounter (Signed)
 Noted

## 2024-06-02 ENCOUNTER — Telehealth: Payer: Self-pay

## 2024-06-02 NOTE — Telephone Encounter (Signed)
 Patient called asking for Rfs of her TMC Ointment and Clobetasol  Ointment.  Okay to refill?  I do see in her last note you did OK Triamcinolone  PRN.

## 2024-06-03 ENCOUNTER — Other Ambulatory Visit: Payer: Self-pay

## 2024-06-03 ENCOUNTER — Ambulatory Visit: Admission: RE | Admit: 2024-06-03 | Discharge: 2024-06-03 | Disposition: A | Source: Ambulatory Visit

## 2024-06-03 ENCOUNTER — Ambulatory Visit
Admission: RE | Admit: 2024-06-03 | Discharge: 2024-06-03 | Disposition: A | Discharge status: Home / Self Care | Source: Ambulatory Visit

## 2024-06-03 DIAGNOSIS — M810 Age-related osteoporosis without current pathological fracture: Secondary | ICD-10-CM | POA: Diagnosis not present

## 2024-06-03 DIAGNOSIS — Z780 Asymptomatic menopausal state: Secondary | ICD-10-CM | POA: Insufficient documentation

## 2024-06-03 DIAGNOSIS — Z1231 Encounter for screening mammogram for malignant neoplasm of breast: Secondary | ICD-10-CM | POA: Insufficient documentation

## 2024-06-03 DIAGNOSIS — L409 Psoriasis, unspecified: Secondary | ICD-10-CM

## 2024-06-03 MED ORDER — CLOBETASOL PROPIONATE 0.05 % EX OINT: 1.0000 | TOPICAL_OINTMENT | Freq: Two times a day (BID) | CUTANEOUS | 2 refills | Status: AC

## 2024-06-03 MED ORDER — TRIAMCINOLONE ACETONIDE 0.1 % EX OINT: TOPICAL_OINTMENT | Freq: Two times a day (BID) | CUTANEOUS | 0 refills | Status: DC

## 2024-06-03 NOTE — Progress Notes (Signed)
 Refill sent in per patient request. aw

## 2024-06-04 NOTE — Addendum Note (Signed)
 Addended by: Laurrie Toppin on: 06/04/2024 08:58 AM   Modules accepted: Orders

## 2024-06-09 ENCOUNTER — Ambulatory Visit: Payer: Self-pay

## 2024-06-09 MED ORDER — ALENDRONATE SODIUM 70 MG PO TABS: 70.0000 mg | ORAL_TABLET | ORAL | 3 refills | Status: AC

## 2024-06-09 NOTE — Telephone Encounter (Signed)
 1. Age-related osteoporosis without current pathological fracture (Primary) - alendronate (FOSAMAX) 70 MG tablet; Take 1 tablet (70 mg total) by mouth once a week. Take with a full glass of water on an empty stomach.  Dispense: 12 tablet; Refill: 3  Kendyl Bissonnette, MD

## 2024-06-09 NOTE — Addendum Note (Signed)
 Addended by: Monchel Pollitt on: 06/09/2024 08:58 AM   Modules accepted: Orders

## 2024-06-16 ENCOUNTER — Telehealth: Payer: Self-pay

## 2024-06-16 NOTE — Telephone Encounter (Signed)
 Fax received from [employer] ([employer]) stating that A Medicare Extra Help letter indicating whether Extra Help was denied is missing. I spoke with patient and she already spoke with [employer] last week is waiting on form to arrive in the mail and she will bring it to our office to fax back to [employer].

## 2024-06-18 LAB — GENECONNECT MOLECULAR SCREEN: Genetic Analysis Overall Interpretation: NEGATIVE

## 2024-06-26 ENCOUNTER — Other Ambulatory Visit: Payer: Self-pay | Admitting: Dermatology

## 2024-06-26 DIAGNOSIS — L409 Psoriasis, unspecified: Secondary | ICD-10-CM

## 2024-07-08 ENCOUNTER — Ambulatory Visit

## 2024-07-09 ENCOUNTER — Ambulatory Visit

## 2024-09-23 ENCOUNTER — Ambulatory Visit

## 2024-09-23 ENCOUNTER — Ambulatory Visit: Admitting: Dermatology

## 2024-10-15 ENCOUNTER — Ambulatory Visit: Admitting: Cardiology

## 2025-02-24 ENCOUNTER — Ambulatory Visit
